# Patient Record
Sex: Male | Born: 1950
Health system: Southern US, Community
[De-identification: ages and names within clinical notes are randomized; demographics above are authoritative.]

## PROBLEM LIST (undated history)

## (undated) DIAGNOSIS — N486 Induration penis plastica: Secondary | ICD-10-CM

## (undated) DIAGNOSIS — Z79899 Other long term (current) drug therapy: Secondary | ICD-10-CM

## (undated) DIAGNOSIS — Z7901 Long term (current) use of anticoagulants: Secondary | ICD-10-CM

## (undated) DIAGNOSIS — K219 Gastro-esophageal reflux disease without esophagitis: Secondary | ICD-10-CM

## (undated) DIAGNOSIS — E119 Type 2 diabetes mellitus without complications: Secondary | ICD-10-CM

## (undated) DIAGNOSIS — Z794 Long term (current) use of insulin: Secondary | ICD-10-CM

## (undated) DIAGNOSIS — Z8673 Personal history of transient ischemic attack (TIA), and cerebral infarction without residual deficits: Secondary | ICD-10-CM

## (undated) DIAGNOSIS — Z9049 Acquired absence of other specified parts of digestive tract: Secondary | ICD-10-CM

## (undated) DIAGNOSIS — H3530 Unspecified macular degeneration: Secondary | ICD-10-CM

## (undated) DIAGNOSIS — Z8719 Personal history of other diseases of the digestive system: Secondary | ICD-10-CM

## (undated) DIAGNOSIS — I499 Cardiac arrhythmia, unspecified: Secondary | ICD-10-CM

## (undated) DIAGNOSIS — I10 Essential (primary) hypertension: Secondary | ICD-10-CM

## (undated) DIAGNOSIS — Z8501 Personal history of malignant neoplasm of esophagus: Secondary | ICD-10-CM

## (undated) DIAGNOSIS — G709 Myoneural disorder, unspecified: Secondary | ICD-10-CM

## (undated) DIAGNOSIS — Z950 Presence of cardiac pacemaker: Secondary | ICD-10-CM

## (undated) DIAGNOSIS — Z9889 Other specified postprocedural states: Secondary | ICD-10-CM

## (undated) DIAGNOSIS — R911 Solitary pulmonary nodule: Secondary | ICD-10-CM

## (undated) DIAGNOSIS — I480 Paroxysmal atrial fibrillation: Secondary | ICD-10-CM

## (undated) DIAGNOSIS — K862 Cyst of pancreas: Secondary | ICD-10-CM

## (undated) DIAGNOSIS — R001 Bradycardia, unspecified: Secondary | ICD-10-CM

## (undated) DIAGNOSIS — F32A Depression, unspecified: Secondary | ICD-10-CM

## (undated) DIAGNOSIS — Z973 Presence of spectacles and contact lenses: Secondary | ICD-10-CM

## (undated) DIAGNOSIS — Z87442 Personal history of urinary calculi: Secondary | ICD-10-CM

## (undated) HISTORY — DX: Unspecified macular degeneration: H35.30

## (undated) HISTORY — PX: CARDIAC PACEMAKER PLACEMENT: SHX583

## (undated) HISTORY — PX: TRANSTHORACIC ECHOCARDIOGRAM: SHX275

## (undated) HISTORY — PX: ESOPHAGECTOMY: SUR457

## (undated) HISTORY — PX: CARDIAC ELECTROPHYSIOLOGY STUDY AND ABLATION: SHX1294

## (undated) HISTORY — PX: ESOPHAGOGASTRODUODENOSCOPY (EGD) WITH ESOPHAGEAL DILATION: SHX5812

## (undated) HISTORY — DX: Long term (current) use of anticoagulants: Z79.01

## (undated) HISTORY — DX: Essential (primary) hypertension: I10

## (undated) HISTORY — DX: Depression, unspecified: F32.A

---
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
## 1980-09-05 HISTORY — PX: INGUINAL HERNIA REPAIR: SUR1180

## 1981-09-05 HISTORY — PX: CHOLECYSTECTOMY: SHX55

## 1985-09-05 HISTORY — PX: INGUINAL HERNIA REPAIR: SUR1180

## 1986-09-05 HISTORY — PX: CHOLECYSTECTOMY: SHX55

## 1988-09-05 HISTORY — PX: KNEE ARTHROSCOPY: SUR90

## 1999-04-12 ENCOUNTER — Encounter: Admission: RE | Admit: 1999-04-12 | Discharge: 1999-07-11 | Payer: Self-pay | Admitting: Internal Medicine

## 2000-02-17 ENCOUNTER — Encounter: Payer: Self-pay | Admitting: *Deleted

## 2000-02-17 ENCOUNTER — Ambulatory Visit (HOSPITAL_COMMUNITY): Admission: RE | Admit: 2000-02-17 | Discharge: 2000-02-17 | Payer: Self-pay | Admitting: *Deleted

## 2001-09-05 DIAGNOSIS — Z8673 Personal history of transient ischemic attack (TIA), and cerebral infarction without residual deficits: Secondary | ICD-10-CM

## 2001-09-05 HISTORY — DX: Personal history of transient ischemic attack (TIA), and cerebral infarction without residual deficits: Z86.73

## 2001-12-14 ENCOUNTER — Encounter: Payer: Self-pay | Admitting: Internal Medicine

## 2001-12-14 ENCOUNTER — Ambulatory Visit (HOSPITAL_COMMUNITY): Admission: RE | Admit: 2001-12-14 | Discharge: 2001-12-14 | Payer: Self-pay | Admitting: Internal Medicine

## 2003-05-05 ENCOUNTER — Ambulatory Visit (HOSPITAL_COMMUNITY): Admission: RE | Admit: 2003-05-05 | Discharge: 2003-05-05 | Payer: Self-pay | Admitting: Gastroenterology

## 2004-09-05 DIAGNOSIS — Z87442 Personal history of urinary calculi: Secondary | ICD-10-CM

## 2004-09-05 HISTORY — DX: Personal history of urinary calculi: Z87.442

## 2004-11-30 ENCOUNTER — Emergency Department (HOSPITAL_COMMUNITY): Admission: EM | Admit: 2004-11-30 | Discharge: 2004-12-01 | Payer: Self-pay | Admitting: Emergency Medicine

## 2011-09-06 DIAGNOSIS — C801 Malignant (primary) neoplasm, unspecified: Secondary | ICD-10-CM

## 2011-09-06 HISTORY — DX: Malignant (primary) neoplasm, unspecified: C80.1

## 2012-04-12 ENCOUNTER — Encounter (INDEPENDENT_AMBULATORY_CARE_PROVIDER_SITE_OTHER): Payer: Self-pay | Admitting: General Surgery

## 2012-04-12 ENCOUNTER — Ambulatory Visit (INDEPENDENT_AMBULATORY_CARE_PROVIDER_SITE_OTHER): Payer: BC Managed Care – PPO | Admitting: General Surgery

## 2012-04-12 DIAGNOSIS — Z6841 Body Mass Index (BMI) 40.0 and over, adult: Secondary | ICD-10-CM

## 2012-04-12 DIAGNOSIS — E785 Hyperlipidemia, unspecified: Secondary | ICD-10-CM

## 2012-04-12 DIAGNOSIS — I10 Essential (primary) hypertension: Secondary | ICD-10-CM

## 2012-04-12 DIAGNOSIS — E119 Type 2 diabetes mellitus without complications: Secondary | ICD-10-CM

## 2012-04-12 NOTE — Progress Notes (Signed)
Patient ID: Lawrence [name]., male   DOB: [birth year], 61 y.o.   MRN: [id number]  Chief Complaint  Patient presents with  . Bariatric Pre-op    initial    HPI Lawrence [name]. is a 61 y.o. male. This patient presents for initial evaluation and consultation for weight loss surgery. He has a BMI of 41 with comorbidities of hypertension, hyperlipidemia, diabetes mellitus and arthritis. He has struggled with his weight since he was married and has children. He says that he was very athletic previously but since he's been working and married he has not had time to keep her routine for exercise and is currently not exercising regularly. He has been on several diets with the best result from [employer] which he is currently on. This has been able to maintain his legs but he really hasn't been able to lose much. 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  He denies any reflux. He says that he is debating whether to do the sleeve of the bypass but it leaning towards a sleeve gastrectomy. He has been diabetic for about 20 years and his last hemoglobin A1c was around the 7 range. HPI  Past Medical History  Diagnosis Date  . Diabetes mellitus   . Hyperlipidemia   . Hypertension     Past Surgical History  Procedure Date  . Hernia repair     2x  . Joint replacement   . Cholecystectomy     Family History  Problem Relation Age of Onset  . Cancer Father     kidney    Social History History  Substance Use Topics  . Smoking status: Never Smoker   . Smokeless tobacco: Not on file  . Alcohol Use: Yes     socially    Allergies  Allergen Reactions  . Codeine Anxiety    Current Outpatient Prescriptions  Medication Sig Dispense Refill  . amLODipine (NORVASC) 10 MG tablet       . aspirin 325 MG tablet Take 325 mg by mouth daily.      Marland Kitchen atorvastatin (LIPITOR) 20 MG tablet Take 20 mg by mouth daily.      . B-D INS SYR ULTRAFINE  1CC/30G 30G X 1/2" 1 ML MISC       . FLUoxetine (PROZAC) 20 MG tablet       . fluticasone (FLONASE) 50 MCG/ACT nasal spray Place 2 sprays into the nose daily.      Marland Kitchen glimepiride (AMARYL) 2 MG tablet Take 2 mg by mouth daily before breakfast.      . LANTUS 100 UNIT/ML injection       . lisinopril-hydrochlorothiazide (PRINZIDE,ZESTORETIC) 20-25 MG per tablet       . LORazepam (ATIVAN) 1 MG tablet Take 1 mg by mouth every 8 (eight) hours.      . metFORMIN (GLUCOPHAGE-XR) 500 MG 24 hr tablet       . ONE TOUCH ULTRA TEST test strip       . pioglitazone (ACTOS) 45 MG tablet Take 45 mg by mouth daily.      . simvastatin (ZOCOR) 40 MG tablet       . nebivolol (BYSTOLIC) 5 MG tablet Take 5 mg by mouth daily.        Review of Systems Review of Systems All other review of systems negative or noncontributory except as stated in the HPI Blood pressure 138/64, pulse 73, temperature 97 F (36.1 C), temperature  source Temporal, resp. rate 16, height 6\' 1"  (1.854 m), weight 307 lb 9.6 oz (139.526 kg).  Physical Exam Physical Exam Physical Exam  Vitals reviewed. Constitutional: He is oriented to person, place, and time. He appears well-developed and well-nourished. No distress.  HENT:  Head: Normocephalic and atraumatic.  Mouth/Throat: No oropharyngeal exudate.  Eyes: Conjunctivae and EOM are normal. Pupils are equal, round, and reactive to light. Right eye exhibits no discharge. Left eye exhibits no discharge. No scleral icterus.  Neck: Normal range of motion. No tracheal deviation present.  Cardiovascular: Normal rate, regular rhythm and normal heart sounds.   Pulmonary/Chest: Effort normal and breath sounds normal. No stridor. No respiratory distress. He has no wheezes. He has no rales. He exhibits no tenderness.  Abdominal: Soft. Bowel sounds are normal. He exhibits no distension and no mass. There is no tenderness. There is no rebound and no guarding.  Well healed RUQ incision. Musculoskeletal:  Normal range of motion. He exhibits no edema and no tenderness.  Neurological: He is alert and oriented to person, place, and time.  Skin: Skin is warm and dry. No rash noted. He is not diaphoretic. No erythema. No pallor.  Psychiatric: He has a normal mood and affect. His behavior is normal. Judgment and thought content normal.    Data Reviewed   Assessment    Morbid obesity with a BMI of 41 and comorbidities of hypertension, diabetes, hyperlipidemia and arthritis I think that he would be a fine candidate for weight loss surgery and we discussed all of the medical and surgical options for weight loss including the lap band, sleeve gastrectomy, Roux-en-Y gastric bypass. We discussed the pros and cons and the risks and benefits of each and he is debating between the sleeve gastrectomy or the gastric bypass. He does not have reflux and would be fine candidate for the sleeve but I also explained that given his lack of activity and diabetes, and the Roux-en-Y gastric bypass might be a more reliable option for him. Either way of, I think that he would do well. The risks of infection, bleeding, pain, scarring, weight regain, too little or too much weight loss, vitamin deficiencies and need for lifelong vitamin supplementation, hair loss, need for protein supplementation, leaks, stricture, reflux, food intolerance, need for reoperation and conversion to roux Y gastric bypass, need for open surgery, injury to spleen or surrounding structures, DVT's, PE, and death again discussed with the patient and the patient expressed understanding and desires to proceed with the workuhep for sleeve or RYGB.   Plan    We will refer him for psychology evaluation of a nutrition evaluation of and check his nutrition labs and we will get him set up for preoperative workup for possible room a gastric bypass or sleeve gastrectomy depending on what he chooses.       Lodema Pilot DAVID 04/12/2012, 11:11 AM

## 2012-04-13 LAB — CBC WITH DIFFERENTIAL/PLATELET
Basophils Relative: 1 % (ref 0–1)
Eosinophils Absolute: 0.1 10*3/uL (ref 0.0–0.7)
HCT: 45 % (ref 39.0–52.0)
Hemoglobin: 15.2 g/dL (ref 13.0–17.0)
MCH: 30.8 pg (ref 26.0–34.0)
MCHC: 33.8 g/dL (ref 30.0–36.0)
Monocytes Absolute: 0.6 10*3/uL (ref 0.1–1.0)
Monocytes Relative: 8 % (ref 3–12)
Neutrophils Relative %: 64 % (ref 43–77)

## 2012-04-13 LAB — COMPREHENSIVE METABOLIC PANEL
Alkaline Phosphatase: 85 U/L (ref 39–117)
BUN: 22 mg/dL (ref 6–23)
Glucose, Bld: 121 mg/dL — ABNORMAL HIGH (ref 70–99)
Total Bilirubin: 0.9 mg/dL (ref 0.3–1.2)

## 2012-04-13 LAB — HEMOGLOBIN A1C: Hgb A1c MFr Bld: 8.6 % — ABNORMAL HIGH (ref <5.7)

## 2012-04-13 LAB — LIPID PANEL
Cholesterol: 157 mg/dL (ref 0–200)
VLDL: 21 mg/dL (ref 0–40)

## 2012-04-13 LAB — T4: T4, Total: 7.8 ug/dL (ref 5.0–12.5)

## 2012-04-16 LAB — H. PYLORI ANTIBODY, IGG: H Pylori IgG: 0.69 {ISR}

## 2012-04-24 ENCOUNTER — Encounter: Payer: Self-pay | Admitting: *Deleted

## 2012-04-24 ENCOUNTER — Encounter: Payer: BC Managed Care – PPO | Attending: General Surgery | Admitting: *Deleted

## 2012-04-24 DIAGNOSIS — Z01818 Encounter for other preprocedural examination: Secondary | ICD-10-CM | POA: Insufficient documentation

## 2012-04-24 DIAGNOSIS — Z713 Dietary counseling and surveillance: Secondary | ICD-10-CM | POA: Insufficient documentation

## 2012-04-24 NOTE — Patient Instructions (Signed)
   Follow Pre-Op Nutrition Goals to prepare for Gastric Bypass Surgery.   Call the [facility] at [phone] once you have been given your surgery date to enrolled in the Pre-Op Nutrition Class. You will need to attend this nutrition class 3-4 weeks prior to your surgery. 

## 2012-04-24 NOTE — Progress Notes (Signed)
  Pre-Op Assessment Visit:  Pre-Operative RYGB Surgery  Medical Nutrition Therapy:  Appt start time: 0900   End time:  1000.  Patient was seen on 04/24/2012 for Pre-Operative RYGB Nutrition Assessment. Assessment and letter of approval faxed to [facility] B[facility] Program coordinator on 04/24/2012.  Approval letter sent to [facility] center and will be available in the chart under the media tab.  Handouts given during visit include:  Pre-Op Goals   Bariatric Surgery Protein Shakes handout  Patient to call for Pre-Op and Post-Op Nutrition Education at the [facility] when surgery is scheduled.

## 2012-04-26 ENCOUNTER — Ambulatory Visit (HOSPITAL_COMMUNITY): Payer: Self-pay

## 2012-04-30 ENCOUNTER — Ambulatory Visit (HOSPITAL_COMMUNITY)
Admission: RE | Admit: 2012-04-30 | Discharge: 2012-04-30 | Disposition: A | Discharge status: Home / Self Care | Payer: BC Managed Care – PPO | Source: Ambulatory Visit | Attending: General Surgery | Admitting: General Surgery

## 2012-04-30 DIAGNOSIS — E785 Hyperlipidemia, unspecified: Secondary | ICD-10-CM | POA: Insufficient documentation

## 2012-04-30 DIAGNOSIS — K449 Diaphragmatic hernia without obstruction or gangrene: Secondary | ICD-10-CM | POA: Insufficient documentation

## 2012-04-30 DIAGNOSIS — K219 Gastro-esophageal reflux disease without esophagitis: Secondary | ICD-10-CM | POA: Insufficient documentation

## 2012-04-30 DIAGNOSIS — M129 Arthropathy, unspecified: Secondary | ICD-10-CM | POA: Insufficient documentation

## 2012-04-30 DIAGNOSIS — E119 Type 2 diabetes mellitus without complications: Secondary | ICD-10-CM

## 2012-04-30 DIAGNOSIS — Z6841 Body Mass Index (BMI) 40.0 and over, adult: Secondary | ICD-10-CM | POA: Insufficient documentation

## 2012-04-30 DIAGNOSIS — I10 Essential (primary) hypertension: Secondary | ICD-10-CM | POA: Insufficient documentation

## 2012-05-17 ENCOUNTER — Encounter: Payer: Self-pay | Admitting: *Deleted

## 2012-05-17 ENCOUNTER — Telehealth: Payer: Self-pay | Admitting: *Deleted

## 2012-05-17 NOTE — Telephone Encounter (Signed)
Isabelle Course called regarding Pre-Op class for her husband, who is having RYGB. Is still awaiting a surgery date from [facility]. He is hoping to have surgery prior to Oct 10th when he is set to leave town on business.  Will email Pre-Op diet and Bariatric Surgery Protein Shakes handout to Isabelle Course so he can start the diet 2 weeks before his surgery if date is given last minute. She verbalized understanding that he will still need to attend Pre-Op class or do one-on-one visits with Olegario Messier and myself prior to surgery. States she will call with questions.

## 2012-05-31 ENCOUNTER — Encounter: Payer: Self-pay | Admitting: *Deleted

## 2012-05-31 ENCOUNTER — Encounter: Payer: BC Managed Care – PPO | Attending: General Surgery | Admitting: *Deleted

## 2012-05-31 ENCOUNTER — Other Ambulatory Visit (INDEPENDENT_AMBULATORY_CARE_PROVIDER_SITE_OTHER): Payer: Self-pay | Admitting: General Surgery

## 2012-05-31 VITALS — Ht 73.0 in | Wt 301.8 lb

## 2012-05-31 DIAGNOSIS — Z713 Dietary counseling and surveillance: Secondary | ICD-10-CM | POA: Insufficient documentation

## 2012-05-31 DIAGNOSIS — Z01818 Encounter for other preprocedural examination: Secondary | ICD-10-CM | POA: Insufficient documentation

## 2012-05-31 DIAGNOSIS — E669 Obesity, unspecified: Secondary | ICD-10-CM

## 2012-05-31 NOTE — Patient Instructions (Signed)
Follow:   Pre-Op Diet per MD 2 weeks prior to surgery  Phase 2- Liquids (clear/full) 2 weeks after surgery  Vitamin/Mineral/Calcium guidelines for purchasing bariatric supplements  Exercise guidelines pre and post-op per MD  Follow-up at [facility] in 2 weeks post-op for diet advancement. Contact Felice Deem as needed with questions/concerns. 

## 2012-05-31 NOTE — Progress Notes (Signed)
Bariatric Class:  Appt start time: 0830 end time:  0930.  Pre-Operative Nutrition Class  Patient was seen on 05/31/2012 for Pre-Operative Bariatric Surgery Education at the [facility].   Surgery date: TBD Surgery type: RYGB Start weight at [facility]: 306.2 (04/24/12)  Weight today: 301.8 lbs Weight change: 4.4 lbs Total weight lost: 4.4 lbs BMI: 39.8 kg/m^2  Samples given per [facility]NT protocol: Bariatric Advantage Multivitamin Lot # [id number]; Exp: 12/13  Celebrate Vitamins Multivitamin Lot # [id number]; Exp: 07/14  Celebrate Vitamins Iron + C 60 mg Lot # [id number]; Exp: 03/14  Celebrate Vitamins Sublingual B12 Lot # [id number]; Exp: 05/15  Opurity Vitamins Multivitamin Lot # [id number]; Exp: 11/14  Premier Nutrition Protein Shake Lot # [id number]; Exp: 03/01/13  The following the learning objective met by the patient during this course:  Identifies Pre-Op Dietary Goals and will begin 2 weeks pre-operatively  Identifies appropriate sources of fluids and proteins   States protein recommendations and appropriate sources pre and post-operatively  Identifies Post-Operative Dietary Goals and will follow for 2 weeks post-operatively  Identifies appropriate multivitamin and calcium sources  Describes the need for physical activity post-operatively and will follow MD recommendations  States when to call healthcare provider regarding medication questions or post-operative complications  Handouts given during class include:  Pre-Op Bariatric Surgery Diet Handout  Protein Shake Handout  Post-Op Bariatric Surgery Nutrition Handout  [facility] Program Information Flyer  Support Group Information Flyer  Follow-Up Plan: Patient will follow-up at [facility] 2 weeks post operatively for diet advancement per MD.

## 2012-06-05 HISTORY — PX: LAPAROSCOPIC GASTRIC SLEEVE RESECTION: SHX5895

## 2012-06-12 ENCOUNTER — Encounter (HOSPITAL_COMMUNITY): Payer: Self-pay | Admitting: Pharmacy Technician

## 2012-06-14 ENCOUNTER — Telehealth (INDEPENDENT_AMBULATORY_CARE_PROVIDER_SITE_OTHER): Payer: Self-pay

## 2012-06-14 NOTE — Telephone Encounter (Signed)
Patient notified of appt time change for 06/15/12 w/Dr. Biagio Quint

## 2012-06-15 ENCOUNTER — Ambulatory Visit (INDEPENDENT_AMBULATORY_CARE_PROVIDER_SITE_OTHER): Payer: BC Managed Care – PPO | Admitting: General Surgery

## 2012-06-15 ENCOUNTER — Encounter (INDEPENDENT_AMBULATORY_CARE_PROVIDER_SITE_OTHER): Payer: Self-pay | Admitting: General Surgery

## 2012-06-15 DIAGNOSIS — E119 Type 2 diabetes mellitus without complications: Secondary | ICD-10-CM

## 2012-06-15 DIAGNOSIS — Z6841 Body Mass Index (BMI) 40.0 and over, adult: Secondary | ICD-10-CM

## 2012-06-15 DIAGNOSIS — I10 Essential (primary) hypertension: Secondary | ICD-10-CM

## 2012-06-15 DIAGNOSIS — E786 Lipoprotein deficiency: Secondary | ICD-10-CM

## 2012-06-15 NOTE — Progress Notes (Signed)
Patient ID: Lawrence Donald Ahonen Jr., male   DOB: 11/05/1950, 61 y.o.   MRN: [id number]  Chief Complaint  Patient presents with  . Bariatric Pre-op    HPI Lawrence Donald Remo Jr. is a 61 y.o. male.  This patient is here for his preoperative evaluation prior to weight loss surgery. He has been trying to decide between the sleeve gastrectomy and Roux-en-Y gastric bypass and has been approved for Roux-en-Y gastric bypass. He has selected this because of the greater chance of resolution of his comorbidities and because of his diabetes. However, he again emphasized to me that if necessary at the time of his procedure if Roux-en-Y gastric bypass is not possible laparoscopically, then he would prefer a laparoscopic sleeve gastrectomy over open bypass. He travels frequently in has arthritis in his knees which limit his ability for exercise. He has already had an upper GI and laboratory studies and is ready for his procedure. HPI  Past Medical History  Diagnosis Date  . Diabetes mellitus   . Hyperlipidemia   . Hypertension   . Morbid obesity     Past Surgical History  Procedure Date  . Hernia repair     2x  . Joint replacement   . Cholecystectomy     Family History  Problem Relation Age of Onset  . Cancer Father     kidney    Social History History  Substance Use Topics  . Smoking status: Never Smoker   . Smokeless tobacco: Not on file  . Alcohol Use: Yes     socially    Allergies  Allergen Reactions  . Codeine Anxiety    Current Outpatient Prescriptions  Medication Sig Dispense Refill  . albuterol (PROVENTIL HFA;VENTOLIN HFA) 108 (90 BASE) MCG/ACT inhaler Inhale 2 puffs into the lungs every 6 (six) hours as needed. For shortness of breath or wheezing      . amLODipine (NORVASC) 10 MG tablet Take 10 mg by mouth every morning.       . aspirin 325 MG tablet Take 325 mg by mouth every morning.       . atorvastatin (LIPITOR) 20 MG tablet Take 20 mg by mouth every morning.         . B-D INS SYR ULTRAFINE 1CC/30G 30G X 1/2" 1 ML MISC       . cimetidine (TAGAMET HB) 200 MG tablet Take 200 mg by mouth 4 (four) times daily as needed. For upset stomach      . FLUoxetine (PROZAC) 20 MG tablet Take 20 mg by mouth every morning.       . fluticasone (FLONASE) 50 MCG/ACT nasal spray Place 2 sprays into the nose daily.      . glimepiride (AMARYL) 2 MG tablet Take 2 mg by mouth daily before breakfast.      . ibuprofen (ADVIL,MOTRIN) 200 MG tablet Take 400 mg by mouth every 6 (six) hours as needed. For pain      . LANTUS 100 UNIT/ML injection Inject 55 Units into the skin every morning.       . lisinopril-hydrochlorothiazide (PRINZIDE,ZESTORETIC) 20-25 MG per tablet Take 1 tablet by mouth every morning.       . LORazepam (ATIVAN) 1 MG tablet Take 1 mg by mouth every 8 (eight) hours as needed. For anxiety      . metFORMIN (GLUCOPHAGE-XR) 500 MG 24 hr tablet Take 500 mg by mouth daily with breakfast.       . nebivolol (BYSTOLIC) 5 MG tablet Take 5   mg by mouth daily.      . ONE TOUCH ULTRA TEST test strip       . pioglitazone (ACTOS) 45 MG tablet Take 45 mg by mouth every morning.       . simvastatin (ZOCOR) 40 MG tablet Take 40 mg by mouth at bedtime.       . vitamin C (ASCORBIC ACID) 500 MG tablet Take 500 mg by mouth daily as needed. When sick        Review of Systems Review of Systems All other review of systems negative or noncontributory except as stated in the HPI  Blood pressure 130/82, pulse 68, temperature 99 F (37.2 C), temperature source Oral, resp. rate 18, height 6' 1" (1.854 m), weight 300 lb 3.2 oz (136.17 kg).  Physical Exam Physical Exam Physical Exam  Vitals reviewed. Constitutional: He is oriented to person, place, and time. He appears well-developed and well-nourished. No distress.  HENT:  Head: Normocephalic and atraumatic.  Mouth/Throat: No oropharyngeal exudate.  Eyes: Conjunctivae and EOM are normal. Pupils are equal, round, and reactive to light.  Right eye exhibits no discharge. Left eye exhibits no discharge. No scleral icterus.  Neck: Normal range of motion. No tracheal deviation present.  Cardiovascular: Normal rate, regular rhythm and normal heart sounds.   Pulmonary/Chest: Effort normal and breath sounds normal. No stridor. No respiratory distress. He has no wheezes. He has no rales. He exhibits no tenderness.  Abdominal: Soft. Bowel sounds are normal. He exhibits no distension and no mass. There is no tenderness. There is no rebound and no guarding. he does have a right upper quadrant subcostal incision from a prior open cholecystectomy Musculoskeletal: Normal range of motion. He exhibits no edema and no tenderness.  Neurological: He is alert and oriented to person, place, and time.  Skin: Skin is warm and dry. No rash noted. He is not diaphoretic. No erythema. No pallor.  Psychiatric: He has a normal mood and affect. His behavior is normal. Judgment and thought content normal.    Data Reviewed Ugi, labs  Assessment    Morbid obesity with a BMI of 40 and comorbidities of hypertension, diabetes, hyperlipidemia, and arthritis He seems anxious and motivated for his upcoming weight loss surgery. He has selected a laparoscopic Roux-en-Y gastric bypass and we discussed the pros and cons and risks and benefits of this. The risks of infection, bleeding, pain, scarring, weight regain, too little or too much weight loss, vitamin deficiencies and need for lifelong vitamin supplementation, hair loss, need for protein supplementation, leaks, stricture, reflux, food intolerance, need for reoperation , need for open surgery, injury to spleen or surrounding structures, DVT's, PE, and death again discussed with the patient and the patient expressed understanding and desires to proceed with laparoscopic RYGB, possible open, intraoperative endoscopy. Because of his prior open cholecystectomy, he understands that he may be higher risk for open procedure  and he has indicated to me that he would rather have the intraoperative decision to convert to laparoscopic sleeve gastrectomy if it is not possible to perform laparoscopic Roux-en-Y gastric bypass. In other words, he would rather have laparoscopic Roux-en-Y gastric bypass followed by laparoscopic sleeve gastrectomy followed by open gastric bypass or sleeve gastrectomy in that order.       Plan    We will proceed with obstructed Roux-en-Y gastric bypass as scheduled. An       Elaijah Munoz DAVID 06/15/2012, 1:20 PM    

## 2012-06-18 ENCOUNTER — Encounter (HOSPITAL_COMMUNITY)
Admission: RE | Admit: 2012-06-18 | Discharge: 2012-06-18 | Disposition: A | Discharge status: Home / Self Care | Payer: BC Managed Care – PPO | Source: Ambulatory Visit | Attending: General Surgery | Admitting: General Surgery

## 2012-06-18 ENCOUNTER — Encounter (HOSPITAL_COMMUNITY): Payer: Self-pay

## 2012-06-18 ENCOUNTER — Ambulatory Visit (HOSPITAL_COMMUNITY)
Admission: RE | Admit: 2012-06-18 | Discharge: 2012-06-18 | Disposition: A | Discharge status: Home / Self Care | Payer: BC Managed Care – PPO | Source: Ambulatory Visit | Attending: General Surgery | Admitting: General Surgery

## 2012-06-18 DIAGNOSIS — Z01818 Encounter for other preprocedural examination: Secondary | ICD-10-CM | POA: Insufficient documentation

## 2012-06-18 DIAGNOSIS — R059 Cough, unspecified: Secondary | ICD-10-CM | POA: Insufficient documentation

## 2012-06-18 DIAGNOSIS — Z01812 Encounter for preprocedural laboratory examination: Secondary | ICD-10-CM | POA: Insufficient documentation

## 2012-06-18 DIAGNOSIS — R05 Cough: Secondary | ICD-10-CM | POA: Insufficient documentation

## 2012-06-18 DIAGNOSIS — Z9089 Acquired absence of other organs: Secondary | ICD-10-CM | POA: Insufficient documentation

## 2012-06-18 LAB — COMPREHENSIVE METABOLIC PANEL
ALT: 16 U/L (ref 0–53)
AST: 17 U/L (ref 0–37)
CO2: 27 mEq/L (ref 19–32)
Calcium: 9.4 mg/dL (ref 8.4–10.5)
Chloride: 97 mEq/L (ref 96–112)
GFR calc non Af Amer: 77 mL/min — ABNORMAL LOW (ref >90)
Sodium: 134 mEq/L — ABNORMAL LOW (ref 135–145)
Total Bilirubin: 0.5 mg/dL (ref 0.3–1.2)

## 2012-06-18 LAB — CBC WITH DIFFERENTIAL/PLATELET
Basophils Absolute: 0 10*3/uL (ref 0.0–0.1)
Lymphocytes Relative: 19 % (ref 12–46)
Neutro Abs: 6.3 10*3/uL (ref 1.7–7.7)
Neutrophils Relative %: 72 % (ref 43–77)
Platelets: 255 10*3/uL (ref 150–400)
RDW: 12.9 % (ref 11.5–15.5)
WBC: 8.7 10*3/uL (ref 4.0–10.5)

## 2012-06-18 NOTE — Progress Notes (Signed)
04-30-2012 ekg epic

## 2012-06-18 NOTE — Patient Instructions (Signed)
20 Lawrence [name].  06/18/2012   Your procedure is scheduled on:  06-26-2012  Report to [facility] at 0515  AM.  Call this number if you have problems the morning of surgery: [phone]   Remember:   Do not eat food or drink liquids:After Midnight.  .  Take these medicines the morning of surgery with A SIP OF WATER: amlodipine, lipitor, prozac, bystolic  Do not wear jewelry or make up.  Do not wear lotions, powders, or perfumes. You may wear deodorant.    Do not bring valuables to the hospital.  Contacts, dentures or bridgework may not be worn into surgery.  Leave suitcase in the car. After surgery it may be brought to your room.  For patients admitted to the hospital, checkout time is 11:00 AM the day of discharge                             Patients discharged the day of surgery will not be allowed to drive home. If going home same day of surgery, you must have someone stay with you the first 24 hours at home and arrange for some one to drive you home from hospital.    Special Instructions: See [facility] Preparing for Surgery instruction sheet. Women do not shave legs or underarms for 12 hours before showers. Men may shave face morning of surgery.    Please read over the following fact sheets that you were given: MRSA Information  [facility] pre op nurse phone number [phone], call if needed

## 2012-06-18 NOTE — Progress Notes (Signed)
06/18/12 0905  OBSTRUCTIVE SLEEP APNEA  Score 4 or greater  Results sent to PCP

## 2012-06-22 ENCOUNTER — Ambulatory Visit (INDEPENDENT_AMBULATORY_CARE_PROVIDER_SITE_OTHER): Payer: BC Managed Care – PPO | Admitting: General Surgery

## 2012-06-26 ENCOUNTER — Inpatient Hospital Stay (HOSPITAL_COMMUNITY): Payer: BC Managed Care – PPO | Admitting: Anesthesiology

## 2012-06-26 ENCOUNTER — Encounter (HOSPITAL_COMMUNITY): Payer: Self-pay | Admitting: *Deleted

## 2012-06-26 ENCOUNTER — Inpatient Hospital Stay (HOSPITAL_COMMUNITY)
Admission: RE | Admit: 2012-06-26 | Discharge: 2012-06-28 | DRG: 468 | Disposition: A | Discharge status: Home / Self Care | Payer: BC Managed Care – PPO | Source: Ambulatory Visit | Attending: General Surgery | Admitting: General Surgery

## 2012-06-26 ENCOUNTER — Encounter (HOSPITAL_COMMUNITY): Payer: Self-pay | Admitting: Anesthesiology

## 2012-06-26 ENCOUNTER — Encounter (HOSPITAL_COMMUNITY)
Admission: RE | Disposition: A | Discharge status: Home / Self Care | Payer: Self-pay | Source: Ambulatory Visit | Attending: General Surgery

## 2012-06-26 DIAGNOSIS — K432 Incisional hernia without obstruction or gangrene: Secondary | ICD-10-CM | POA: Diagnosis present

## 2012-06-26 DIAGNOSIS — I10 Essential (primary) hypertension: Secondary | ICD-10-CM

## 2012-06-26 DIAGNOSIS — C159 Malignant neoplasm of esophagus, unspecified: Secondary | ICD-10-CM

## 2012-06-26 DIAGNOSIS — E11319 Type 2 diabetes mellitus with unspecified diabetic retinopathy without macular edema: Secondary | ICD-10-CM | POA: Diagnosis present

## 2012-06-26 DIAGNOSIS — Z885 Allergy status to narcotic agent status: Secondary | ICD-10-CM

## 2012-06-26 DIAGNOSIS — M129 Arthropathy, unspecified: Secondary | ICD-10-CM | POA: Diagnosis present

## 2012-06-26 DIAGNOSIS — Z9089 Acquired absence of other organs: Secondary | ICD-10-CM

## 2012-06-26 DIAGNOSIS — F411 Generalized anxiety disorder: Secondary | ICD-10-CM | POA: Diagnosis present

## 2012-06-26 DIAGNOSIS — Z8673 Personal history of transient ischemic attack (TIA), and cerebral infarction without residual deficits: Secondary | ICD-10-CM

## 2012-06-26 DIAGNOSIS — Z7982 Long term (current) use of aspirin: Secondary | ICD-10-CM

## 2012-06-26 DIAGNOSIS — E1039 Type 1 diabetes mellitus with other diabetic ophthalmic complication: Secondary | ICD-10-CM | POA: Diagnosis present

## 2012-06-26 DIAGNOSIS — Z794 Long term (current) use of insulin: Secondary | ICD-10-CM

## 2012-06-26 DIAGNOSIS — Z9660 Presence of unspecified orthopedic joint implant: Secondary | ICD-10-CM

## 2012-06-26 DIAGNOSIS — K219 Gastro-esophageal reflux disease without esophagitis: Secondary | ICD-10-CM | POA: Diagnosis present

## 2012-06-26 DIAGNOSIS — E119 Type 2 diabetes mellitus without complications: Secondary | ICD-10-CM | POA: Diagnosis present

## 2012-06-26 DIAGNOSIS — E785 Hyperlipidemia, unspecified: Secondary | ICD-10-CM | POA: Diagnosis present

## 2012-06-26 DIAGNOSIS — Z79899 Other long term (current) drug therapy: Secondary | ICD-10-CM

## 2012-06-26 DIAGNOSIS — Z6838 Body mass index (BMI) 38.0-38.9, adult: Secondary | ICD-10-CM

## 2012-06-26 DIAGNOSIS — K449 Diaphragmatic hernia without obstruction or gangrene: Secondary | ICD-10-CM

## 2012-06-26 DIAGNOSIS — Z5309 Procedure and treatment not carried out because of other contraindication: Secondary | ICD-10-CM

## 2012-06-26 DIAGNOSIS — I495 Sick sinus syndrome: Secondary | ICD-10-CM | POA: Diagnosis present

## 2012-06-26 DIAGNOSIS — I4891 Unspecified atrial fibrillation: Secondary | ICD-10-CM

## 2012-06-26 HISTORY — PX: BIOPSY: SHX5522

## 2012-06-26 HISTORY — PX: HIATAL HERNIA REPAIR: SHX195

## 2012-06-26 HISTORY — PX: INCISIONAL HERNIA REPAIR: SHX193

## 2012-06-26 LAB — GLUCOSE, CAPILLARY
Glucose-Capillary: 96 mg/dL (ref 70–99)
Glucose-Capillary: 234 mg/dL — ABNORMAL HIGH (ref 70–99)
Glucose-Capillary: 173 mg/dL — ABNORMAL HIGH (ref 70–99)

## 2012-06-26 SURGERY — ENDOSCOPY, UPPER GI TRACT
Anesthesia: General

## 2012-06-26 MED ORDER — LISINOPRIL 20 MG PO TABS
20.0000 mg | ORAL_TABLET | Freq: Every day | ORAL | Status: DC
  Administered 2012-06-26: 20 mg via ORAL
  Filled 2012-06-26 (×3): qty 1

## 2012-06-26 MED ORDER — HYDROMORPHONE HCL PF 1 MG/ML IJ SOLN
0.2500 mg | INTRAMUSCULAR | Status: DC | PRN
  Administered 2012-06-26 (×3): 0.5 mg via INTRAVENOUS

## 2012-06-26 MED ORDER — ONDANSETRON HCL 4 MG/2ML IJ SOLN: 4.0000 mg | Freq: Four times a day (QID) | INTRAMUSCULAR | Status: DC | PRN

## 2012-06-26 MED ORDER — ENOXAPARIN SODIUM 40 MG/0.4ML ~~LOC~~ SOLN
40.0000 mg | SUBCUTANEOUS | Status: DC
  Filled 2012-06-26 (×2): qty 0.4

## 2012-06-26 MED ORDER — LORAZEPAM 1 MG PO TABS
1.0000 mg | ORAL_TABLET | Freq: Three times a day (TID) | ORAL | Status: DC | PRN
  Administered 2012-06-26: 1 mg via ORAL
  Filled 2012-06-26: qty 1

## 2012-06-26 MED ORDER — ALBUTEROL SULFATE HFA 108 (90 BASE) MCG/ACT IN AERS
2.0000 | INHALATION_SPRAY | Freq: Four times a day (QID) | RESPIRATORY_TRACT | Status: DC | PRN
  Filled 2012-06-26: qty 6.7

## 2012-06-26 MED ORDER — NEOSTIGMINE METHYLSULFATE 1 MG/ML IJ SOLN
INTRAMUSCULAR | Status: DC | PRN
  Administered 2012-06-26: 5 mg via INTRAVENOUS

## 2012-06-26 MED ORDER — NEBIVOLOL HCL 5 MG PO TABS
5.0000 mg | ORAL_TABLET | Freq: Every day | ORAL | Status: DC
  Filled 2012-06-26 (×3): qty 1

## 2012-06-26 MED ORDER — ROCURONIUM BROMIDE 100 MG/10ML IV SOLN
INTRAVENOUS | Status: DC | PRN
  Administered 2012-06-26 (×5): 10 mg via INTRAVENOUS
  Administered 2012-06-26: 40 mg via INTRAVENOUS
  Administered 2012-06-26: 10 mg via INTRAVENOUS

## 2012-06-26 MED ORDER — LISINOPRIL-HYDROCHLOROTHIAZIDE 20-25 MG PO TABS: 1.0000 | ORAL_TABLET | Freq: Every morning | ORAL | Status: DC

## 2012-06-26 MED ORDER — POTASSIUM CHLORIDE IN NACL 20-0.9 MEQ/L-% IV SOLN
INTRAVENOUS | Status: DC
  Administered 2012-06-26 – 2012-06-28 (×4): via INTRAVENOUS
  Filled 2012-06-26 (×8): qty 1000

## 2012-06-26 MED ORDER — FENTANYL CITRATE 0.05 MG/ML IJ SOLN
INTRAMUSCULAR | Status: DC | PRN
  Administered 2012-06-26 (×4): 50 ug via INTRAVENOUS

## 2012-06-26 MED ORDER — PROMETHAZINE HCL 25 MG/ML IJ SOLN: 6.2500 mg | INTRAMUSCULAR | Status: DC | PRN

## 2012-06-26 MED ORDER — SUCCINYLCHOLINE CHLORIDE 20 MG/ML IJ SOLN
INTRAMUSCULAR | Status: DC | PRN
  Administered 2012-06-26: 100 mg via INTRAVENOUS

## 2012-06-26 MED ORDER — SODIUM CHLORIDE 0.9 % IV SOLN
1.0000 g | INTRAVENOUS | Status: AC
  Administered 2012-06-26: 1 g via INTRAVENOUS

## 2012-06-26 MED ORDER — FLUOXETINE HCL 20 MG PO TABS
20.0000 mg | ORAL_TABLET | Freq: Every morning | ORAL | Status: DC
  Administered 2012-06-27 – 2012-06-28 (×2): 20 mg via ORAL
  Filled 2012-06-26 (×2): qty 1

## 2012-06-26 MED ORDER — PROMETHAZINE HCL 25 MG/ML IJ SOLN: 25.0000 mg | Freq: Four times a day (QID) | INTRAMUSCULAR | Status: DC | PRN

## 2012-06-26 MED ORDER — GLYCOPYRROLATE 0.2 MG/ML IJ SOLN
INTRAMUSCULAR | Status: DC | PRN
  Administered 2012-06-26: 0.2 mg via INTRAVENOUS
  Administered 2012-06-26: 0.6 mg via INTRAVENOUS

## 2012-06-26 MED ORDER — BUPIVACAINE-EPINEPHRINE 0.25% -1:200000 IJ SOLN
INTRAMUSCULAR | Status: DC | PRN
  Administered 2012-06-26: 20 mL

## 2012-06-26 MED ORDER — LIDOCAINE HCL (CARDIAC) 20 MG/ML IV SOLN
INTRAVENOUS | Status: DC | PRN
  Administered 2012-06-26: 100 mg via INTRAVENOUS

## 2012-06-26 MED ORDER — PROPOFOL 10 MG/ML IV BOLUS
INTRAVENOUS | Status: DC | PRN
  Administered 2012-06-26: 200 mg via INTRAVENOUS

## 2012-06-26 MED ORDER — HEPARIN SODIUM (PORCINE) 5000 UNIT/ML IJ SOLN
5000.0000 [IU] | Freq: Once | INTRAMUSCULAR | Status: AC
  Administered 2012-06-26: 5000 [IU] via SUBCUTANEOUS
  Filled 2012-06-26: qty 1

## 2012-06-26 MED ORDER — ONDANSETRON HCL 4 MG PO TABS: 4.0000 mg | ORAL_TABLET | Freq: Four times a day (QID) | ORAL | Status: DC | PRN

## 2012-06-26 MED ORDER — SODIUM CHLORIDE 0.9 % IR SOLN
Status: DC | PRN
  Administered 2012-06-26: 1000 mL

## 2012-06-26 MED ORDER — ACETAMINOPHEN 10 MG/ML IV SOLN
INTRAVENOUS | Status: DC | PRN
  Administered 2012-06-26: 1000 mg via INTRAVENOUS

## 2012-06-26 MED ORDER — FLUTICASONE PROPIONATE 50 MCG/ACT NA SUSP
2.0000 | Freq: Every day | NASAL | Status: DC
  Administered 2012-06-26: 2 via NASAL
  Filled 2012-06-26: qty 16

## 2012-06-26 MED ORDER — LACTATED RINGERS IV SOLN
INTRAVENOUS | Status: DC | PRN
  Administered 2012-06-26 (×4): via INTRAVENOUS

## 2012-06-26 MED ORDER — INSULIN ASPART 100 UNIT/ML ~~LOC~~ SOLN
0.0000 [IU] | SUBCUTANEOUS | Status: DC
  Administered 2012-06-26 (×2): 5 [IU] via SUBCUTANEOUS
  Administered 2012-06-27 (×4): 3 [IU] via SUBCUTANEOUS
  Administered 2012-06-28 (×2): 2 [IU] via SUBCUTANEOUS

## 2012-06-26 MED ORDER — MORPHINE SULFATE 4 MG/ML IJ SOLN
4.0000 mg | INTRAMUSCULAR | Status: DC | PRN
  Administered 2012-06-26 (×2): 4 mg via INTRAVENOUS
  Filled 2012-06-26 (×3): qty 1

## 2012-06-26 MED ORDER — DEXAMETHASONE SODIUM PHOSPHATE 10 MG/ML IJ SOLN
INTRAMUSCULAR | Status: DC | PRN
  Administered 2012-06-26: 10 mg via INTRAVENOUS

## 2012-06-26 MED ORDER — KETOROLAC TROMETHAMINE 30 MG/ML IJ SOLN: 15.0000 mg | Freq: Once | INTRAMUSCULAR | Status: DC | PRN

## 2012-06-26 MED ORDER — LIDOCAINE HCL 1 % IJ SOLN
INTRAMUSCULAR | Status: DC | PRN
  Administered 2012-06-26: 20 mL

## 2012-06-26 MED ORDER — ONDANSETRON HCL 4 MG/2ML IJ SOLN
INTRAMUSCULAR | Status: DC | PRN
  Administered 2012-06-26: 4 mg via INTRAVENOUS

## 2012-06-26 MED ORDER — LACTATED RINGERS IR SOLN
Status: DC | PRN
  Administered 2012-06-26: 1500 mL

## 2012-06-26 MED ORDER — HYDROCHLOROTHIAZIDE 25 MG PO TABS
25.0000 mg | ORAL_TABLET | Freq: Every day | ORAL | Status: DC
  Administered 2012-06-26: 25 mg via ORAL
  Filled 2012-06-26 (×3): qty 1

## 2012-06-26 MED ORDER — NEBIVOLOL HCL 5 MG PO TABS: 5.0000 mg | ORAL_TABLET | Freq: Every morning | ORAL | Status: DC

## 2012-06-26 MED ORDER — TISSEEL VH 10 ML EX KIT
PACK | CUTANEOUS | Status: DC | PRN
  Administered 2012-06-26: 10 mL

## 2012-06-26 MED ORDER — MIDAZOLAM HCL 5 MG/5ML IJ SOLN
INTRAMUSCULAR | Status: DC | PRN
  Administered 2012-06-26: 2 mg via INTRAVENOUS

## 2012-06-26 MED ORDER — EPHEDRINE SULFATE 50 MG/ML IJ SOLN
INTRAMUSCULAR | Status: DC | PRN
  Administered 2012-06-26: 15 mg via INTRAVENOUS
  Administered 2012-06-26: 10 mg via INTRAVENOUS

## 2012-06-26 SURGICAL SUPPLY — 64 items
ADH SKN CLS APL DERMABOND .7 (GAUZE/BANDAGES/DRESSINGS) ×2
APL SRG 32X5 SNPLK LF DISP (MISCELLANEOUS) ×2
APPLICATOR COTTON TIP 6IN STRL (MISCELLANEOUS) IMPLANT
APPLIER CLIP ROT 10 11.4 M/L (STAPLE)
APR CLP MED LRG 11.4X10 (STAPLE)
BAG SPEC RTRVL LRG 6X4 10 (ENDOMECHANICALS)
CABLE HIGH FREQUENCY MONO STRZ (ELECTRODE) ×2 IMPLANT
CANISTER SUCTION 2500CC (MISCELLANEOUS) ×3 IMPLANT
CHLORAPREP W/TINT 26ML (MISCELLANEOUS) ×6 IMPLANT
CLIP APPLIE ROT 10 11.4 M/L (STAPLE) ×1 IMPLANT
CLOTH BEACON ORANGE TIMEOUT ST (SAFETY) ×3 IMPLANT
COVER SURGICAL LIGHT HANDLE (MISCELLANEOUS) ×1 IMPLANT
DERMABOND ADVANCED (GAUZE/BANDAGES/DRESSINGS) ×1
DERMABOND ADVANCED .7 DNX12 (GAUZE/BANDAGES/DRESSINGS) ×1 IMPLANT
DEVICE SUT QUICK LOAD TK 5 (STAPLE) ×4 IMPLANT
DEVICE SUT TI-KNOT TK 5X26 (MISCELLANEOUS) ×2 IMPLANT
DEVICE SUTURE ENDOST 10MM (ENDOMECHANICALS) ×2 IMPLANT
DEVICE TROCAR PUNCTURE CLOSURE (ENDOMECHANICALS) ×2 IMPLANT
DRAIN CHANNEL 19F RND (DRAIN) ×1 IMPLANT
DRAPE LAPAROSCOPIC ABDOMINAL (DRAPES) ×3 IMPLANT
ELECT REM PT RETURN 9FT ADLT (ELECTROSURGICAL) ×3
ELECTRODE REM PT RTRN 9FT ADLT (ELECTROSURGICAL) ×2 IMPLANT
EVACUATOR DRAINAGE 10X20 100CC (DRAIN) ×1 IMPLANT
EVACUATOR SILICONE 100CC (DRAIN)
GLOVE SURG SS PI 7.5 STRL IVOR (GLOVE) ×6 IMPLANT
GOWN STRL NON-REIN LRG LVL3 (GOWN DISPOSABLE) ×6 IMPLANT
GOWN STRL REIN XL XLG (GOWN DISPOSABLE) ×8 IMPLANT
HANDLE STAPLE EGIA 4 XL (STAPLE) ×2 IMPLANT
HOVERMATT SINGLE USE (MISCELLANEOUS) ×3 IMPLANT
KIT BASIN OR (CUSTOM PROCEDURE TRAY) ×3 IMPLANT
NDL SPNL 22GX3.5 QUINCKE BK (NEEDLE) ×1 IMPLANT
NEEDLE SPNL 22GX3.5 QUINCKE BK (NEEDLE) ×3 IMPLANT
NS IRRIG 1000ML POUR BTL (IV SOLUTION) ×3 IMPLANT
PEN SKIN MARKING BROAD (MISCELLANEOUS) ×3 IMPLANT
PENCIL BUTTON HOLSTER BLD 10FT (ELECTRODE) ×3 IMPLANT
POUCH SPECIMEN RETRIEVAL 10MM (ENDOMECHANICALS) IMPLANT
RELOAD BLACK 60MM ECHELON (STAPLE) ×6 IMPLANT
RELOAD EGIA 60 MED/THCK PURPLE (STAPLE) IMPLANT
RELOAD GREEN (STAPLE) IMPLANT
RELOAD STAPLE 60 BLK XTHK ART (STAPLE) IMPLANT
RELOAD STAPLE 60 MED/THCK ART (STAPLE) IMPLANT
RELOAD TRI 2.0 60 XTHK VAS SUL (STAPLE) IMPLANT
SCISSORS LAP 5X35 DISP (ENDOMECHANICALS) ×1 IMPLANT
SEALANT SURGICAL APPL DUAL CAN (MISCELLANEOUS) ×3 IMPLANT
SET IRRIG TUBING LAPAROSCOPIC (IRRIGATION / IRRIGATOR) ×3 IMPLANT
SHEARS CURVED HARMONIC AC 45CM (MISCELLANEOUS) ×3 IMPLANT
SLEEVE XCEL OPT CAN 5 100 (ENDOMECHANICALS) ×2 IMPLANT
SOLUTION ANTI FOG 6CC (MISCELLANEOUS) ×3 IMPLANT
SPONGE GAUZE 4X4 12PLY (GAUZE/BANDAGES/DRESSINGS) IMPLANT
SPONGE LAP 18X18 X RAY DECT (DISPOSABLE) ×1 IMPLANT
STAPLE ECHEON FLEX 60 POW ENDO (STAPLE) IMPLANT
STRIP PERI DRY VERITAS 60 (STAPLE) IMPLANT
SUT ETHILON 2 0 PS N (SUTURE) ×3 IMPLANT
SUT MNCRL AB 4-0 PS2 18 (SUTURE) ×5 IMPLANT
SUT SURGIDAC NAB ES-9 0 48 120 (SUTURE) IMPLANT
SUT VICRYL 0 UR6 27IN ABS (SUTURE) ×3 IMPLANT
SYR 50ML LL SCALE MARK (SYRINGE) ×3 IMPLANT
TRAY FOLEY CATH 14FRSI W/METER (CATHETERS) ×3 IMPLANT
TRAY LAP CHOLE (CUSTOM PROCEDURE TRAY) ×3 IMPLANT
TROCAR BLADELESS 15MM (ENDOMECHANICALS) ×3 IMPLANT
TROCAR BLADELESS OPT 5 100 (ENDOMECHANICALS) ×7 IMPLANT
TUBING CONNECTING 10 (TUBING) ×3 IMPLANT
TUBING ENDO SMARTCAP (MISCELLANEOUS) ×3 IMPLANT
TUBING FILTER THERMOFLATOR (ELECTROSURGICAL) ×3 IMPLANT

## 2012-06-26 NOTE — Op Note (Signed)
06/26/2012  1:59 PM  PATIENT:  Lawrence Dolly., 61 y.o., male, MRN: [id number]  PREOP DIAGNOSIS:  Morbid obesity, hiatal hernia  POSTOP DIAGNOSIS:   Morbid obesity, hiatal hernia, dilated esophagus with polypoid hemorraghic mass (apprx 1.5 cm to 2.0 in size) at the esophagogastric junction.  [Photos were taken}  PROCEDURE:  Esophagogastroscopy with biopsy of the distal esophageal mass x 3.  SURGEON:   Ovidio Kin, M.D.  ANESTHESIA:   General  INDICATIONS FOR PROCEDURE:  Lawrence Malecki. is a 61 y.o. (DOB: Jun 23, 1951)  white male whose primary care physician is Lillia Mountain, MD and was undergoing a planned sleeve gastrectomy by Dr. Trude Mcburney.     I am doing the upper endoscopy to document the esophagogastric junction in planning the sleeve gastrectomy.  At surgery, the patient has been found to have a large hiatal hernia filled primarily with omentum.  Dr. Biagio Quint has repaired the hiatal hernia and has mobilized the greater curvature of the stomach.  PROCEDURE:  The patient was under general anesthesia while undergoing a planned sleeve gastrectomy.  He was found to have a large hiatal hernia.  I was doing an upper endoscopy to document the EG junction  for the planned sleeve.  Findings include:   Esophagus:   Dilated.  Consistent with a partially obstructed distal esophagus from fat in the hiatal hernia.  He also has a 1.5 - 2.0 cm hemorrhagic polypoid lesion immediately above the EG junction.  This looks like a lesion secondary to trauma/hiatal hernia, not malignant.  I did 3 biopsies with a cold biopsy forceps of the lesion.   GE junction at:  40 cm   Stomach: Unremarkable.   Dr. Biagio Quint was present during the endoscopy. He was manning the laparoscope while I did the upper endoscopy.   He talked with family and the plan was to not do the sleeve gastrectomy at this time.  Dr. Biagio Quint will dictate the hiatal hernia repair.

## 2012-06-26 NOTE — Anesthesia Postprocedure Evaluation (Signed)
  Anesthesia Post-op Note  Patient: Lawrence [name].  Procedure(s) Performed: Procedure(s) (LRB): UPPER GI ENDOSCOPY () LAPAROSCOPIC REPAIR OF HIATAL HERNIA () HERNIA REPAIR INCISIONAL () BIOPSY ()  Patient Location: PACU  Anesthesia Type: General  Level of Consciousness: awake and alert   Airway and Oxygen Therapy: Patient Spontanous Breathing  Post-op Pain: mild  Post-op Assessment: Post-op Vital signs reviewed, Patient's Cardiovascular Status Stable, Respiratory Function Stable, Patent Airway and No signs of Nausea or vomiting  Post-op Vital Signs: stable  Complications: No apparent anesthesia complications

## 2012-06-26 NOTE — Progress Notes (Signed)
Surgery delayed pending insurance certification on amended procedure planned.  Patient transferred back to Short Stay to await further information.   O R schedule amended to allow for surgery later today if approval obtained.  Patient to remain NPO.

## 2012-06-26 NOTE — Transfer of Care (Signed)
Immediate Anesthesia Transfer of Care Note  Patient: Lawrence [name].  Procedure(s) Performed: Procedure(s) (LRB) with comments: UPPER GI ENDOSCOPY () - with biopsy LAPAROSCOPIC REPAIR OF HIATAL HERNIA () HERNIA REPAIR INCISIONAL () BIOPSY ()  Patient Location: PACU  Anesthesia Type: General  Level of Consciousness: sedated  Airway & Oxygen Therapy: Patient Spontanous Breathing and Patient connected to face mask oxygen  Post-op Assessment: Report given to PACU RN and Post -op Vital signs reviewed and stable  Post vital signs: Reviewed and stable  Complications: No apparent anesthesia complications

## 2012-06-26 NOTE — Interval H&P Note (Signed)
History and Physical Interval Note:  06/26/2012 9:32 AM  Lawrence [name].  has presented today for surgery, with the diagnosis of morbid obesity  The various methods of treatment have been discussed with the patient and family. After consideration of risks, benefits and other options for treatment, the patient has consented to  Procedure(s) (LRB) with comments: LAPAROSCOPIC GASTRIC SLEEVE RESECTION (N/A) as a surgical intervention .  The patient's history has been reviewed, patient examined, no change in status, stable for surgery.  I have reviewed the patient's chart and labs.  Questions were answered to the patient's satisfaction.   The patient was seen and evaluated in the preop area.  Last week he decided that he wanted the sleeve gastrectomy instead of the the RYGB.  He feels most comfortable with this procedure as he originally desired.  I again confirmed this with the patient and he says that he is set in his decision to have the sleeve gastrectomy.  I again discussed the risks with him of the procedure.  The risks of infection, bleeding, pain, scarring, weight regain, too little or too much weight loss, vitamin deficiencies and need for lifelong vitamin supplementation, hair loss, need for protein supplementation, leaks, stricture, reflux, food intolerance, need for reoperation and conversion to roux Y gastric bypass, need for open surgery, injury to spleen or surrounding structures, DVT's, PE, and death again discussed with the patient and the patient expressed understanding and desires to proceed with laparoscopic vertical sleeve gastrectomy, possible open, intraoperative endoscopy.  We have received authorization for the procedure #[id number].  We will proceed with lap vertical sleeve gastrectomy, possible open.   Lawrence [name]

## 2012-06-26 NOTE — Op Note (Signed)
Lawrence [name], Lawrence [name] NO.:  [id number]  MEDICAL RECORD NO.:  [id number]  LOCATION:  1535                         FACILITY:  [facility]  PHYSICIAN:  Lodema Pilot, MD       DATE OF BIRTH:  Jul 15, 1951  DATE OF PROCEDURE:  06/26/2012 DATE OF DISCHARGE:                              OPERATIVE REPORT   PREOPERATIVE DIAGNOSIS:  Morbid obesity.  POSTOPERATIVE DIAGNOSIS:  Morbid obesity.  PROCEDURE:  Laparoscopic hiatal hernia repair, laparoscopic repair of incisional hernia, and intraoperative upper endoscopy with biopsies.  SURGEON:  Lodema Pilot, MD  ASSISTANT:  Dr. Ezzard Standing.  ANESTHESIA:  General endotracheal anesthesia with 40 mL of 1% lidocaine with epinephrine and 0.25% Marcaine in a 50:50 mixture.  FLUIDS:  3400 mL of crystalloid.  ESTIMATED BLOOD LOSS:  Less than 100 mL.  DRAINS:  None.  SPECIMENS:  Distal esophageal biopsies sent to Pathology for permanent sectioning.  COMPLICATIONS:  None apparent.  FINDINGS:  Large hiatal hernia with containing omental fat.  The stomach did not appear herniated.  Primarily repaired posteriorly.  A small incisional hernia defect in the medial aspect of his open cholecystectomy scar.  Distal esophageal mass biopsies were taken.  No sleeve gastrectomy was performed.  INDICATION FOR PROCEDURE:  Lawrence [name] is a 61 year old male with a BMI of 40 and comorbidities of diabetes, hypertension, and hyperlipidemia, in need of a durable weight loss solution.  OPERATIVE DETAILS:  Lawrence [name] was seen and evaluated in the preoperative area and risks and benefits of procedure were again discussed in lay terms.  Informed consent was again confirmed with him that he did want to perform the sleeve gastrectomy since originally he was planning on gastric bypass and he had confirmed with me last week that he was interested in sleeve gastrectomy.  We talked again about the risks and benefits of the procedure and informed consent was  obtained. He was given prophylactic antibiotics and subcutaneous heparin and he was taken to the operating room, placed on table in supine position. General endotracheal tube anesthesia was obtained and the Foley catheter was placed.  His abdomen was prepped and draped in a standard surgical fashion.  Then procedure time-out was performed with all operative team members to confirm proper patient and procedure and the abdomen was accessed and the left upper quadrant with a 5-mm Optiview trocar on the first attempt.  Pneumoperitoneum was obtained and laparoscope was introduced and there was no evidence of bowel injury upon entry.  Then a 5-mm left rectus port was placed under direct visualization and some omental adhesions were taken down from the abdominal wall in the right upper quadrant under his prior open cholecystectomy scar.  There was only omentum adhered to the abdominal wall and this was taken down with sharp dissection and blunt dissection sufficient to place a 5-mm trocar in the right upper quadrant.  A right rectus 15-mm port was placed also under direct visualization.  He had a small incisional hernia at the medial aspect of his open cholecystectomy scar and fat was reduced from the defect.  A 5-mm stab incision was made through the epigastrium and a Nathanson liver retractor was placed to retract  the left lobe of the liver.  At this point, we still could not even see the stomach and after inspecting the area of the hiatus, it was noted to have a moderate to large hiatal hernia and the reason the stomach was not identified is because the omental fat was up into the mediastinum anterior to the stomach.  This was reduced hand over hand, but he had a fairly significant amount of omentum up into the defect, but this was completely reduced and actually looked like the stomach was really not even herniated up into the defect.  Prior to that, we felt that the best thing to do would  be to perform hiatal hernia repair first and so the right crus was identified and dissected posteriorly along the right crus and posterior to the esophagus and performed dissection of the left crus as well, still was difficult to see posteriorly, so measured out from the pylorus at least 5 cm and actually or probably more in the 6-7 range initially and we mobilized the short gastric vessels along the greater curvature of the stomach up to the left crus.  This allowed [place] to better visualize the left crus and mobilize the stomach completely posteriorly until we were able to visualize both the right and left crus posteriorly.  A Penrose drain was passed around the distal esophagus and stomach.  This helped retract the stomach and after the left and right crus were identified posteriorly, I placed 3 sutures posteriorly approximating the crura with a 2-0 Ethibond sutures.  These were secured with tie knot device because space was fairly limited due to its intraabdominal obesity.  It was felt that this pretty well approximated the crus and at this time stomach mobilized.  We were ready to measure out definitively from the pylorus and start with the creation of our gastric sleeve.  However prior to performing the gastric sleeve, we decided to perform upper endoscopy to ensure that we had fully mobilize the stomach out of the hiatus and identify the GE junction.  Dr. Ezzard Standing passed the scope through the esophagus and into the stomach without difficulty.  The stomach appeared healthy and it appeared that we had reduced the stomach and again the stomach had not actually herniated. It was mainly the omentum that was herniated up into the defect, but in the distal esophagus right at the GE junction or just proximal to he had a polypoid lesion and it was unclear if this was benign or malignant, although it did not really appear malignant.  Biopsies were taken and sent to Pathology for permanent  sectioning.  We contemplated whether to perform sleeve gastrectomy or just abort the procedure and wait for the biopsies with the reasoning being that if this does return malignant, then we would not want to resect the greater curvature of the stomach as this would possibly be used as the conduit for esophageal resection. Actually called over to [facility]  and spoke with Dr. Lily Peer and we all felt at this point with the finding of this abnormal distal esophagus, the most conservative thing to do would be to perform biopsies and not continue with the sleeve gastrectomy until after we had received pathology.  I spoke with the patient's wife as well and she thought that this would be what the patient would want as well.  Several biopsies were taken by Dr. Ezzard Standing and these were sent to Pathology for permanent sectioning.  I spoke with the pathologist and he  did not feel confident that he would be able to give a definitive diagnosis of benign or malignant or Barrett esophagitis with or without dysplasia upon frozen section alone.  Therefore, the scope was withdrawn.  The biopsies were taken and the rest of the esophagus appeared normal on the mucosa, however it did appear very patulous and dilated as if all of this fat in the hiatal hernia had been chronically obstructing him distally.  The hiatus appeared adequately closed and the stomach did not appear to be injured during the mobilization.  The abdomen was felt to be hemostatic and the liver retractor was removed under direct visualization.  The right upper quadrant incisional hernia was approximated with 0 PDS figure of suture closed using Endoclose device and the 15-mm port site incision was approximated with 0 Vicryl suture using Endoclose device. The remainder of the trocars were removed under direct visualization. The abdominal wall was noted to be hemostatic.  The wounds were injected with a total of 40 mL of 1%  lidocaine with epinephrine and 0.25% Marcaine in a 50:50 mixture. The skin edges were approximated with 4-0 Monocryl subcuticular suture at all skin incisions.          ______________________________ Lodema Pilot, MD     BL/MEDQ  D:  06/26/2012  T:  06/26/2012  Job:  [id number]

## 2012-06-26 NOTE — Progress Notes (Signed)
Last week, he decided that he wanted to stick with his original plan of lap vertical sleeve gastrectomy.  I think that this is fine and I explained that I am willing to go ahead with this procedure, however, we have not received authorization yet from his insurance carrier for this procedure.  I have recommended that we hold off until we get this approval.  I explained that he could potentially not only be responsible for covering all hospital and procedure costs, but also any costs associated with a potential complication or follow up.  We will delay his procedure until we can get authorization.

## 2012-06-26 NOTE — H&P (View-Only) (Signed)
Patient ID: Lawrence [name]., male   DOB: 1950-12-31, 61 y.o.   MRN: [id number]  Chief Complaint  Patient presents with  . Bariatric Pre-op    HPI Lawrence [name]. is a 61 y.o. male.  This patient is here for his preoperative evaluation prior to weight loss surgery. He has been trying to decide between the sleeve gastrectomy and Roux-en-Y gastric bypass and has been approved for Roux-en-Y gastric bypass. He has selected this because of the greater chance of resolution of his comorbidities and because of his diabetes. However, he again emphasized to me that if necessary at the time of his procedure if Roux-en-Y gastric bypass is not possible laparoscopically, then he would prefer a laparoscopic sleeve gastrectomy over open bypass. He travels frequently in has arthritis in his knees which limit his ability for exercise. He has already had an upper GI and laboratory studies and is ready for his procedure. HPI  Past Medical History  Diagnosis Date  . Diabetes mellitus   . Hyperlipidemia   . Hypertension   . Morbid obesity     Past Surgical History  Procedure Date  . Hernia repair     2x  . Joint replacement   . Cholecystectomy     Family History  Problem Relation Age of Onset  . Cancer Father     kidney    Social History History  Substance Use Topics  . Smoking status: Never Smoker   . Smokeless tobacco: Not on file  . Alcohol Use: Yes     socially    Allergies  Allergen Reactions  . Codeine Anxiety    Current Outpatient Prescriptions  Medication Sig Dispense Refill  . albuterol (PROVENTIL HFA;VENTOLIN HFA) 108 (90 BASE) MCG/ACT inhaler Inhale 2 puffs into the lungs every 6 (six) hours as needed. For shortness of breath or wheezing      . amLODipine (NORVASC) 10 MG tablet Take 10 mg by mouth every morning.       Marland Kitchen aspirin 325 MG tablet Take 325 mg by mouth every morning.       Marland Kitchen atorvastatin (LIPITOR) 20 MG tablet Take 20 mg by mouth every morning.         . B-D INS SYR ULTRAFINE 1CC/30G 30G X 1/2" 1 ML MISC       . cimetidine (TAGAMET HB) 200 MG tablet Take 200 mg by mouth 4 (four) times daily as needed. For upset stomach      . FLUoxetine (PROZAC) 20 MG tablet Take 20 mg by mouth every morning.       . fluticasone (FLONASE) 50 MCG/ACT nasal spray Place 2 sprays into the nose daily.      Marland Kitchen glimepiride (AMARYL) 2 MG tablet Take 2 mg by mouth daily before breakfast.      . ibuprofen (ADVIL,MOTRIN) 200 MG tablet Take 400 mg by mouth every 6 (six) hours as needed. For pain      . LANTUS 100 UNIT/ML injection Inject 55 Units into the skin every morning.       Marland Kitchen lisinopril-hydrochlorothiazide (PRINZIDE,ZESTORETIC) 20-25 MG per tablet Take 1 tablet by mouth every morning.       Marland Kitchen LORazepam (ATIVAN) 1 MG tablet Take 1 mg by mouth every 8 (eight) hours as needed. For anxiety      . metFORMIN (GLUCOPHAGE-XR) 500 MG 24 hr tablet Take 500 mg by mouth daily with breakfast.       . nebivolol (BYSTOLIC) 5 MG tablet Take 5  mg by mouth daily.      . ONE TOUCH ULTRA TEST test strip       . pioglitazone (ACTOS) 45 MG tablet Take 45 mg by mouth every morning.       . simvastatin (ZOCOR) 40 MG tablet Take 40 mg by mouth at bedtime.       . vitamin C (ASCORBIC ACID) 500 MG tablet Take 500 mg by mouth daily as needed. When sick        Review of Systems Review of Systems All other review of systems negative or noncontributory except as stated in the HPI  Blood pressure 130/82, pulse 68, temperature 99 F (37.2 C), temperature source Oral, resp. rate 18, height 6\' 1"  (1.854 m), weight 300 lb 3.2 oz (136.17 kg).  Physical Exam Physical Exam Physical Exam  Vitals reviewed. Constitutional: He is oriented to person, place, and time. He appears well-developed and well-nourished. No distress.  HENT:  Head: Normocephalic and atraumatic.  Mouth/Throat: No oropharyngeal exudate.  Eyes: Conjunctivae and EOM are normal. Pupils are equal, round, and reactive to light.  Right eye exhibits no discharge. Left eye exhibits no discharge. No scleral icterus.  Neck: Normal range of motion. No tracheal deviation present.  Cardiovascular: Normal rate, regular rhythm and normal heart sounds.   Pulmonary/Chest: Effort normal and breath sounds normal. No stridor. No respiratory distress. He has no wheezes. He has no rales. He exhibits no tenderness.  Abdominal: Soft. Bowel sounds are normal. He exhibits no distension and no mass. There is no tenderness. There is no rebound and no guarding. he does have a right upper quadrant subcostal incision from a prior open cholecystectomy Musculoskeletal: Normal range of motion. He exhibits no edema and no tenderness.  Neurological: He is alert and oriented to person, place, and time.  Skin: Skin is warm and dry. No rash noted. He is not diaphoretic. No erythema. No pallor.  Psychiatric: He has a normal mood and affect. His behavior is normal. Judgment and thought content normal.    Data Reviewed Ugi, labs  Assessment    Morbid obesity with a BMI of 40 and comorbidities of hypertension, diabetes, hyperlipidemia, and arthritis He seems anxious and motivated for his upcoming weight loss surgery. He has selected a laparoscopic Roux-en-Y gastric bypass and we discussed the pros and cons and risks and benefits of this. The risks of infection, bleeding, pain, scarring, weight regain, too little or too much weight loss, vitamin deficiencies and need for lifelong vitamin supplementation, hair loss, need for protein supplementation, leaks, stricture, reflux, food intolerance, need for reoperation , need for open surgery, injury to spleen or surrounding structures, DVT's, PE, and death again discussed with the patient and the patient expressed understanding and desires to proceed with laparoscopic RYGB, possible open, intraoperative endoscopy. Because of his prior open cholecystectomy, he understands that he may be higher risk for open procedure  and he has indicated to me that he would rather have the intraoperative decision to convert to laparoscopic sleeve gastrectomy if it is not possible to perform laparoscopic Roux-en-Y gastric bypass. In other words, he would rather have laparoscopic Roux-en-Y gastric bypass followed by laparoscopic sleeve gastrectomy followed by open gastric bypass or sleeve gastrectomy in that order.       Plan    We will proceed with obstructed Roux-en-Y gastric bypass as scheduled. An       Montrelle Eddings DAVID 06/15/2012, 1:20 PM

## 2012-06-26 NOTE — Progress Notes (Signed)
Pt states he does not take bystolic and refuses this drug  States he wa unsure of his home meds when he talked to PAT  Wife takes care of his meds and she wasn't with him at this visit

## 2012-06-26 NOTE — Brief Op Note (Signed)
06/26/2012  2:19 PM  PATIENT:  Lawrence [name].  61 y.o. male  PRE-OPERATIVE DIAGNOSIS:  morbid obesity  POST-OPERATIVE DIAGNOSIS:  morbid obesity  PROCEDURE:  Procedure(s) (LRB) with comments: UPPER GI ENDOSCOPY () - with biopsy LAPAROSCOPIC REPAIR OF HIATAL HERNIA () HERNIA REPAIR INCISIONAL () BIOPSY ()  SURGEON:  Surgeon(s) and Role:    * Lodema Pilot, DO - Primary    * Kandis Cocking, MD - Assisting  PHYSICIAN ASSISTANT:   ASSISTANTS: Newman   ANESTHESIA:   general  EBL:  Total I/O In: 3400 [I.V.:3400] Out: 225 [Urine:200; Blood:25]  BLOOD ADMINISTERED:none  DRAINS: none   LOCAL MEDICATIONS USED:  MARCAINE    and LIDOCAINE   SPECIMEN:  Source of Specimen:  distal esophagus  DISPOSITION OF SPECIMEN:  PATHOLOGY  COUNTS:  YES  TOURNIQUET:  * No tourniquets in log *  DICTATION: .Other Dictation: Dictation Number   PLAN OF CARE: Admit to inpatient   PATIENT DISPOSITION:  PACU - hemodynamically stable.   Delay start of Pharmacological VTE agent (>24hrs) due to surgical blood loss or risk of bleeding: no

## 2012-06-26 NOTE — Anesthesia Preprocedure Evaluation (Signed)
Anesthesia Evaluation  Patient identified by MRN, date of birth, ID band Patient awake    Reviewed: Allergy & Precautions, H&P , NPO status , Patient's Chart, lab work & pertinent test results  Airway Mallampati: II TM Distance: <3 FB Neck ROM: Full    Dental No notable dental hx.    Pulmonary neg pulmonary ROS,  breath sounds clear to auscultation  Pulmonary exam normal       Cardiovascular hypertension, Rhythm:Regular Rate:Normal     Neuro/Psych Anxiety negative neurological ROS     GI/Hepatic negative GI ROS, Neg liver ROS,   Endo/Other  diabetesMorbid obesity  Renal/GU negative Renal ROS  negative genitourinary   Musculoskeletal negative musculoskeletal ROS (+)   Abdominal   Peds negative pediatric ROS (+)  Hematology negative hematology ROS (+)   Anesthesia Other Findings   Reproductive/Obstetrics negative OB ROS                           Anesthesia Physical Anesthesia Plan  ASA: III  Anesthesia Plan: General   Post-op Pain Management:    Induction: Intravenous  Airway Management Planned: Oral ETT  Additional Equipment:   Intra-op Plan:   Post-operative Plan: Extubation in OR  Informed Consent: I have reviewed the patients History and Physical, chart, labs and discussed the procedure including the risks, benefits and alternatives for the proposed anesthesia with the patient or authorized representative who has indicated his/her understanding and acceptance.   Dental advisory given  Plan Discussed with: CRNA and Surgeon  Anesthesia Plan Comments:         Anesthesia Quick Evaluation

## 2012-06-27 ENCOUNTER — Encounter (HOSPITAL_COMMUNITY): Payer: Self-pay | Admitting: General Surgery

## 2012-06-27 LAB — CBC WITH DIFFERENTIAL/PLATELET
HCT: 38.1 % — ABNORMAL LOW (ref 39.0–52.0)
Hemoglobin: 13.3 g/dL (ref 13.0–17.0)
Lymphocytes Relative: 7 % — ABNORMAL LOW (ref 12–46)
Monocytes Absolute: 1.2 10*3/uL — ABNORMAL HIGH (ref 0.1–1.0)
Monocytes Relative: 7 % (ref 3–12)
Neutro Abs: 16.1 10*3/uL — ABNORMAL HIGH (ref 1.7–7.7)
WBC: 18.6 10*3/uL — ABNORMAL HIGH (ref 4.0–10.5)

## 2012-06-27 LAB — COMPREHENSIVE METABOLIC PANEL
ALT: 46 U/L (ref 0–53)
BUN: 22 mg/dL (ref 6–23)
CO2: 27 mEq/L (ref 19–32)
Calcium: 9 mg/dL (ref 8.4–10.5)
Creatinine, Ser: 1.1 mg/dL (ref 0.50–1.35)
GFR calc Af Amer: 82 mL/min — ABNORMAL LOW (ref >90)
GFR calc non Af Amer: 71 mL/min — ABNORMAL LOW (ref >90)
Glucose, Bld: 169 mg/dL — ABNORMAL HIGH (ref 70–99)
Sodium: 131 mEq/L — ABNORMAL LOW (ref 135–145)

## 2012-06-27 LAB — CBC
Hemoglobin: 13.3 g/dL (ref 13.0–17.0)
MCHC: 34.6 g/dL (ref 30.0–36.0)
WBC: 15.3 10*3/uL — ABNORMAL HIGH (ref 4.0–10.5)

## 2012-06-27 LAB — GLUCOSE, CAPILLARY
Glucose-Capillary: 117 mg/dL — ABNORMAL HIGH (ref 70–99)
Glucose-Capillary: 153 mg/dL — ABNORMAL HIGH (ref 70–99)
Glucose-Capillary: 160 mg/dL — ABNORMAL HIGH (ref 70–99)

## 2012-06-27 MED ORDER — ENOXAPARIN SODIUM 40 MG/0.4ML ~~LOC~~ SOLN
40.0000 mg | SUBCUTANEOUS | Status: DC
  Administered 2012-06-27 – 2012-06-28 (×2): 40 mg via SUBCUTANEOUS
  Filled 2012-06-27 (×2): qty 0.4

## 2012-06-27 MED ORDER — METFORMIN HCL ER 500 MG PO TB24
500.0000 mg | ORAL_TABLET | Freq: Every day | ORAL | Status: DC
  Administered 2012-06-28: 500 mg via ORAL
  Filled 2012-06-27 (×2): qty 1

## 2012-06-27 MED ORDER — PIOGLITAZONE HCL 45 MG PO TABS
45.0000 mg | ORAL_TABLET | Freq: Every day | ORAL | Status: DC
  Administered 2012-06-27 – 2012-06-28 (×2): 45 mg via ORAL
  Filled 2012-06-27 (×2): qty 1

## 2012-06-27 MED ORDER — PANTOPRAZOLE SODIUM 40 MG PO TBEC: 40.0000 mg | DELAYED_RELEASE_TABLET | Freq: Every day | ORAL | Status: DC

## 2012-06-27 MED ORDER — PANTOPRAZOLE SODIUM 40 MG IV SOLR
40.0000 mg | Freq: Two times a day (BID) | INTRAVENOUS | Status: DC
  Administered 2012-06-27 (×2): 40 mg via INTRAVENOUS
  Filled 2012-06-27 (×2): qty 40

## 2012-06-27 MED ORDER — OXYCODONE-ACETAMINOPHEN 5-325 MG/5ML PO SOLN
10.0000 mL | Freq: Four times a day (QID) | ORAL | Status: DC | PRN
  Administered 2012-06-27 – 2012-06-28 (×4): 10 mL via ORAL
  Filled 2012-06-27 (×4): qty 10

## 2012-06-27 MED ORDER — PANTOPRAZOLE SODIUM 40 MG PO TBEC
40.0000 mg | DELAYED_RELEASE_TABLET | Freq: Two times a day (BID) | ORAL | Status: DC
  Administered 2012-06-28 (×3): 40 mg via ORAL
  Filled 2012-06-27 (×4): qty 1

## 2012-06-27 MED FILL — Lidocaine Inj 1% w/ Epinephrine-1:100000: INTRAMUSCULAR | Qty: 30 | Status: AC

## 2012-06-27 MED FILL — Bupivacaine HCl Preservative Free (PF) Inj 0.25%: INTRAMUSCULAR | Qty: 30 | Status: AC

## 2012-06-27 NOTE — Progress Notes (Signed)
Dr Daphine Deutscher notified of patient experiencing low heart rate. Dr Daphine Deutscher suggested to continue to monitor patient's heart rate and continue to hold pain medication.

## 2012-06-27 NOTE — Care Management Note (Signed)
    Page 1 of 1   06/27/2012     12:07:36 PM   CARE MANAGEMENT NOTE 06/27/2012  Patient:  Lawrence [name], Lawrence [name]   Account Number:  [id number]  Date Initiated:  06/27/2012  Documentation initiated by:  Lorenda Ishihara  Subjective/Objective Assessment:   61 yo male admitted s/p hiatal hernia repair. PTA lived at home with spouse     Action/Plan:   d/c home when stable.   Anticipated DC Date:  06/28/2012   Anticipated DC Plan:  HOME/SELF CARE      DC Planning Services  CM consult      Choice offered to / List presented to:             Status of service:  Completed, signed off Medicare Important Message given?   (If response is "NO", the following Medicare IM given date fields will be blank) Date Medicare IM given:   Date Additional Medicare IM given:    Discharge Disposition:  HOME/SELF CARE  Per UR Regulation:  Reviewed for med. necessity/level of care/duration of stay  If discussed at Long Length of Stay Meetings, dates discussed:    Comments:

## 2012-06-27 NOTE — Progress Notes (Signed)
1 Day Post-Op  Subjective: No issues.  Pain controlled.  Objective: Vital signs in last 24 hours: Temp:  [97.5 F (36.4 C)-99.2 F (37.3 C)] 97.8 F (36.6 C) (10/23 0551) Pulse Rate:  [46-85] 61  (10/23 0551) Resp:  [12-20] 18  (10/23 0551) BP: (118-138)/(50-64) 137/58 mmHg (10/23 0551) SpO2:  [92 %-100 %] 94 % (10/23 0551) Weight:  [290 lb (131.543 kg)] 290 lb (131.543 kg) (10/22 1635) Last BM Date: 06/24/12  Intake/Output from previous day: 10/22 0701 - 10/23 0700 In: 5181.3 [I.V.:5181.3] Out: 850 [Urine:825; Blood:25] Intake/Output this shift:    General appearance: alert, cooperative and no distress Resp: clear to auscultation bilaterally Cardio: brady, regular GI: soft, appropriate incisional tenderness, ND, wounds without infection  Lab Results:   Basename 06/27/12 0455  WBC 15.3*  HGB 13.3  HCT 38.4*  PLT 227   BMET  Basename 06/27/12 0455  NA 131*  K 4.6  CL 97  CO2 27  GLUCOSE 169*  BUN 22  CREATININE 1.10  CALCIUM 9.0   PT/INR No results found for this basename: LABPROT:2,INR:2 in the last 72 hours ABG No results found for this basename: PHART:2,PCO2:2,PO2:2,HCO3:2 in the last 72 hours  Studies/Results: No results found.  Anti-infectives: Anti-infectives     Start     Dose/Rate Route Frequency Ordered Stop   06/26/12 0528   ertapenem (INVANZ) 1 g in sodium chloride 0.9 % 50 mL IVPB        1 g 100 mL/hr over 30 Minutes Intravenous 60 min pre-op 06/26/12 0528 06/26/12 1024          Assessment/Plan: s/p Procedure(s) (LRB) with comments: UPPER GI ENDOSCOPY () - with biopsy LAPAROSCOPIC REPAIR OF HIATAL HERNIA () HERNIA REPAIR INCISIONAL () BIOPSY () full liquids.  oral pain meds.  anticipate home tomorrow.  repeat HGB and start lovenox if HGB okay.  mobilize.  LOS: 1 day    Lawrence [name] DAVID 06/27/2012

## 2012-06-27 NOTE — Progress Notes (Signed)
Order received for Lawrence [name] that may leave patient's IV out Means, Myrtie Hawk RN 06-27-2012 18:43pm

## 2012-06-28 DIAGNOSIS — C159 Malignant neoplasm of esophagus, unspecified: Secondary | ICD-10-CM | POA: Diagnosis present

## 2012-06-28 DIAGNOSIS — I4891 Unspecified atrial fibrillation: Secondary | ICD-10-CM | POA: Diagnosis not present

## 2012-06-28 DIAGNOSIS — E119 Type 2 diabetes mellitus without complications: Secondary | ICD-10-CM | POA: Diagnosis present

## 2012-06-28 DIAGNOSIS — E785 Hyperlipidemia, unspecified: Secondary | ICD-10-CM | POA: Diagnosis present

## 2012-06-28 DIAGNOSIS — I10 Essential (primary) hypertension: Secondary | ICD-10-CM | POA: Diagnosis present

## 2012-06-28 DIAGNOSIS — G459 Transient cerebral ischemic attack, unspecified: Secondary | ICD-10-CM | POA: Insufficient documentation

## 2012-06-28 LAB — COMPREHENSIVE METABOLIC PANEL
ALT: 38 U/L (ref 0–53)
BUN: 17 mg/dL (ref 6–23)
Calcium: 8.7 mg/dL (ref 8.4–10.5)
GFR calc Af Amer: >90 mL/min (ref >90)
Glucose, Bld: 135 mg/dL — ABNORMAL HIGH (ref 70–99)
Sodium: 135 mEq/L (ref 135–145)
Total Protein: 5.8 g/dL — ABNORMAL LOW (ref 6.0–8.3)

## 2012-06-28 LAB — GLUCOSE, CAPILLARY
Glucose-Capillary: 97 mg/dL (ref 70–99)
Glucose-Capillary: 105 mg/dL — ABNORMAL HIGH (ref 70–99)
Glucose-Capillary: 130 mg/dL — ABNORMAL HIGH (ref 70–99)
Glucose-Capillary: 134 mg/dL — ABNORMAL HIGH (ref 70–99)

## 2012-06-28 MED ORDER — OXYCODONE-ACETAMINOPHEN 5-325 MG/5ML PO SOLN: 10.0000 mL | Freq: Four times a day (QID) | ORAL | Status: DC | PRN

## 2012-06-28 NOTE — Progress Notes (Signed)
I spoke with Dr. Katrinka Blazing and he feels like he will be okay for discharge on his current medications.  He will follow up with his primary physician next week.

## 2012-06-28 NOTE — Consult Note (Addendum)
Admit date: 06/26/2012 Referring Physician:  Dr. Trude Mcburney Primary Physician:  Kirby Funk, MD Primary Cardiologist:  [facility] Katrinka Blazing, III, MD Reason for Consultation:   Atrial fibrillation  ASSESSMENT: 1. Paroxysmal atrial fibrillation (first documented 2009), likely related to the Tachy-Brady syndrome (marked SB prior to A fib) 2. Hypertension 3. Morbid obesity 4. Remote TIA, 2003 5. 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 Diabetes mellitus 7. Hiatal hernia  PLAN: 1. Resume aspirin 325 mg daily. When safe, start coumadin or surrogate(Xarelto, Pradaxa, or Eliquis), as CHADS2 score is > 2 2. Arrange management of esophageal cancer. If a long wait before surgery, would go ahead and start anticoagulation when safe(related to recent procedure and biopsy). 3. Okay to discharge today 4. We will f/u in 1-2 weeks 5. Resume usual medications at discharge. May need to change Norvasc to diltiazem as OP.   HPI: Pleasant gentleman found to have esophageal cancer during planned [facility] repair and bariatric procedure.   Found to have sinus bradycardia during this hospital stay. He suddenly developed A fib with controlled VR at rest. Heart rate increases to 125-135 bpm range with ambulation in the hall. He was first diagnosed with atrial fibrillation in 2009 by Dr. Roseanne Reno. Echo at that time revealed structurally normal heart with LA size of 4 cm. Prior cardiac w/u in 2001 demonstrated normal coronary arteries and LV function. Chest pain was felt due to reflux and HH.  Faint palpitations which are currently present have been occurring off and on for years.   PMH:   Past Medical History  Diagnosis Date  . Diabetes mellitus   . Hyperlipidemia   . Hypertension   . Morbid obesity   . Anxiety      PSH:   Past Surgical History  Procedure Date  . Hernia repair     2x  . Cholecystectomy left knee arthrospoc   . Knee arthroscopic surgery 1992  . Tonsillectomy as child  .  Hiatal hernia repair 06/26/2012    Procedure: LAPAROSCOPIC REPAIR OF HIATAL HERNIA;  Surgeon: Lodema Pilot, DO;  Location: [facility];  Service: General;;  . Incisional hernia repair 06/26/2012    Procedure: HERNIA REPAIR INCISIONAL;  Surgeon: Lodema Pilot, DO;  Location: [facility];  Service: General;;  . Esophageal biopsy 06/26/2012    Procedure: BIOPSY;  Surgeon: Lodema Pilot, DO;  Location: [facility];  Service: General;;    Allergies:  Codeine Prior to Admit Meds:   Prescriptions prior to admission  Medication Sig Dispense Refill  . FLUoxetine (PROZAC) 20 MG tablet Take 20 mg by mouth every morning.       Marland Kitchen LANTUS 100 UNIT/ML injection Inject 55 Units into the skin every morning.       Marland Kitchen lisinopril-hydrochlorothiazide (PRINZIDE,ZESTORETIC) 20-25 MG per tablet Take 1 tablet by mouth every morning.       Marland Kitchen LORazepam (ATIVAN) 1 MG tablet Take 1 mg by mouth every 8 (eight) hours as needed. For anxiety      . metFORMIN (GLUCOPHAGE-XR) 500 MG 24 hr tablet Take 500 mg by mouth daily with breakfast.       . pioglitazone (ACTOS) 45 MG tablet Take 45 mg by mouth every morning.       . simvastatin (ZOCOR) 40 MG tablet Take 40 mg by mouth every morning.       . vitamin C (ASCORBIC ACID) 500 MG tablet Take 500 mg by mouth daily as needed. When sick      . DISCONTD:  amLODipine (NORVASC) 10 MG tablet Take 10 mg by mouth every morning.       Marland Kitchen DISCONTD: nebivolol (BYSTOLIC) 5 MG tablet Take 5 mg by mouth every morning.       Marland Kitchen albuterol (PROVENTIL HFA;VENTOLIN HFA) 108 (90 BASE) MCG/ACT inhaler Inhale 2 puffs into the lungs every 6 (six) hours as needed. For shortness of breath or wheezing      . aspirin 325 MG tablet Take 325 mg by mouth every morning.       Marland Kitchen atorvastatin (LIPITOR) 20 MG tablet Take 20 mg by mouth every morning.       . B-D INS SYR ULTRAFINE 1CC/30G 30G X 1/2" 1 ML MISC       . cimetidine (TAGAMET HB) 200 MG tablet Take 200 mg by mouth 4 (four) times daily as needed. For upset stomach      .  fluticasone (FLONASE) 50 MCG/ACT nasal spray Place 2 sprays into the nose daily.      Marland Kitchen glimepiride (AMARYL) 2 MG tablet Take 2 mg by mouth daily before breakfast.      . ibuprofen (ADVIL,MOTRIN) 200 MG tablet Take 400 mg by mouth every 6 (six) hours as needed. For pain      . ONE TOUCH ULTRA TEST test strip        Fam HX:    Family History  Problem Relation Age of Onset  . Cancer Father     kidney   Social HX:    History   Social History  . Marital Status: Married    Spouse Name: N/A    Number of Children: N/A  . Years of Education: N/A   Occupational History  . Not on file.   Social History Main Topics  . Smoking status: Never Smoker   . Smokeless tobacco: Never Used  . Alcohol Use: Yes     socially  . Drug Use: No  . Sexually Active:    Other Topics Concern  . Not on file   Social History Narrative  . No narrative on file     Review of Systems: No additional positive,. No recent neurological complaints. H/O TIA 2003  Physical Exam: Blood pressure 119/72, pulse 60, temperature 98.2 F (36.8 C), temperature source Oral, resp. rate 16, height 6\' 1"  (1.854 m), weight 131.543 kg (290 lb), SpO2 97.00%. Weight change:   Obese. Able to lay flat and there. He is in no respiratory distress.  Chest auscultation and percussion.  Cardiac exam reveals irregularly irregular rhythm. No murmur, rub, click, gallop, is heard.  Abdomen soft and nontender.  Extremities reveal no edema. Radial and posterior tibial pulses are 2+ and symmetric.  Neurological exam is intact. The patient is somewhat flaccid/depressed Labs:   Lab Results  Component Value Date   WBC 18.6* 06/27/2012   HGB 13.3 06/27/2012   HCT 38.1* 06/27/2012   MCV 89.0 06/27/2012   PLT 230 06/27/2012    Lab 06/27/12 2350  NA 135  K 3.8  CL 100  CO2 29  BUN 17  CREATININE 0.86  CALCIUM 8.7  PROT 5.8*  BILITOT 0.5  ALKPHOS 65  ALT 38  AST 31  GLUCOSE 135*      Lab Results  Component Value  Date   CHOL 157 04/13/2012   Lab Results  Component Value Date   HDL 40 04/13/2012   Lab Results  Component Value Date   LDLCALC 96 04/13/2012   Lab Results  Component Value Date  TRIG 103 04/13/2012   Lab Results  Component Value Date   CHOLHDL 3.9 04/13/2012   No results found for this basename: LDLDIRECT      Radiology:  No results found. EKG:  None performed. Monitor strips reveal a fib with controlled v rate at rest.    Lesleigh Noe 06/28/2012 5:13 PM

## 2012-06-28 NOTE — Progress Notes (Signed)
Notified Dr Dwain Sarna about patient's heart rate decreasing .  Received order to transfer patient to telemetry unit.

## 2012-06-28 NOTE — Progress Notes (Signed)
2 Days Post-Op  Subjective: Feels okay.  Pain controlled, no nausea and tolerating full liquids. Path returned adenocarcinoma. Asymptomatic from bradycardia.   Objective: Vital signs in last 24 hours: Temp:  [94.6 F (34.8 C)-98.6 F (37 C)] 97.4 F (36.3 C) (10/24 0606) Pulse Rate:  [44-60] 46  (10/24 0606) Resp:  [18] 18  (10/24 0606) BP: (112-130)/(63-72) 124/67 mmHg (10/24 0606) SpO2:  [94 %-99 %] 96 % (10/24 0606) Last BM Date: 06/24/12  Intake/Output from previous day: 10/23 0701 - 10/24 0700 In: 1425 [P.O.:400; I.V.:1025] Out: 1850 [Urine:1850] Intake/Output this shift:    General appearance: alert, cooperative and no distress Resp: clear to auscultation bilaterally Cardio: brady, regular GI: soft, minimal upper abdominal tenderness, ND, wounds without infection, no peritoneal signs  Lab Results:   Basename 06/27/12 0900 06/27/12 0455  WBC 18.6* 15.3*  HGB 13.3 13.3  HCT 38.1* 38.4*  PLT 230 227   BMET  Basename 06/27/12 2350 06/27/12 0455  NA 135 131*  K 3.8 4.6  CL 100 97  CO2 29 27  GLUCOSE 135* 169*  BUN 17 22  CREATININE 0.86 1.10  CALCIUM 8.7 9.0   PT/INR No results found for this basename: LABPROT:2,INR:2 in the last 72 hours ABG No results found for this basename: PHART:2,PCO2:2,PO2:2,HCO3:2 in the last 72 hours  Studies/Results: No results found.  Anti-infectives: Anti-infectives     Start     Dose/Rate Route Frequency Ordered Stop   06/26/12 0528   ertapenem (INVANZ) 1 g in sodium chloride 0.9 % 50 mL IVPB        1 g 100 mL/hr over 30 Minutes Intravenous 60 min pre-op 06/26/12 0528 06/26/12 1024          Assessment/Plan: s/p Procedure(s) (LRB) with comments: UPPER GI ENDOSCOPY () - with biopsy LAPAROSCOPIC REPAIR OF HIATAL HERNIA () HERNIA REPAIR INCISIONAL () BIOPSY () He looks okay from his procedure. No nausea and tolerating diet.  I had a long discussion with him regarding his pathology and expected treatments.  He  otherwise feels fine and the bradycardia is asymptomatic.  I spoke with his physician Dr. Valentina Lucks who will see him next week to follow up and also Dr. Anne Fu who agreed that if he is asymptomatic that he should be okay for discharge with outpatient follow up.  We will walk him on tele and see if we get an appropriate response prior to discharge.  LOS: 2 days    Lawrence [name] 06/28/2012

## 2012-06-28 NOTE — Progress Notes (Signed)
Pt converted to afib.  Dr. Biagio Quint notified.

## 2012-06-29 ENCOUNTER — Other Ambulatory Visit (INDEPENDENT_AMBULATORY_CARE_PROVIDER_SITE_OTHER): Payer: Self-pay

## 2012-06-29 DIAGNOSIS — C159 Malignant neoplasm of esophagus, unspecified: Secondary | ICD-10-CM

## 2012-06-29 NOTE — Discharge Summary (Signed)
Physician Discharge Summary  Patient ID: Lawrence [name]. MRN: [id number] DOB/AGE: 10-Jul-1951 61 y.o.  Admit date: 06/26/2012 Discharge date: 06/29/2012  Admission Diagnoses: morbid obesity  Discharge Diagnoses: esophageal cnacer Active Problems:  Type I (juvenile type) diabetes mellitus with ophthalmic manifestations, not stated as uncontrolled(250.51)  Hypertension, benign  Atrial fibrillation  Esophageal cancer  Hyperlipidemia  Morbid obesity   Discharged Condition: stable  Hospital Course: to OR 06/26/12 for lap sleeve gastrectomy.  He was found to have a hiatal hernia and this was repaired laparoscopically. At the time of his hiatal hernia repair, he was found to have a distal esophageal mass and biopsies were taken. He did not have his sleeve gastrectomy performed due to the findings of the esophageal mass. He was admitted for observation and pain control after surgery. He did well from his procedure and biopsies returned on postop day #1 consistent with invasive adenocarcinoma with signet rings cells features. On postop day #2 cardiology was consult for asymptomatic bradycardia and  Atrial fibrillation. They felt that this was likely a chronic intermittent fibrillation and felt that he was okay for discharge. He was tolerating regular diet and pain control and stable for discharge home on postop day #2  Consults: cardiology  Significant Diagnostic Studies: none  Treatments: surgery: 06/26/12 lap hiatal hernia repair/EGD with biopsies  Disposition: 01-Home or Self Care  Discharge Orders    Future Appointments: Provider: Department: Dept Phone: Center:   07/12/2012 1:15 PM Lodema Pilot, DO [facility] [phone] None     Future Orders Please Complete By Expires   Increase activity slowly      Discharge instructions      Comments:   Follow up with Dr. Valentina Lucks next week as scheduled. Follow up with Dr. Biagio Quint in 2 weeks.  Call [phone] for appointment Follow  up with a thoracic surgeon of your choice for further evaluation and treatment of esophageal cancer.  You can call Dr. Biagio Quint at [phone] for assistance with this. Soft diet for 2 weeks until follow up with Biagio Quint. You may shower normally Go to ER for any irregular heartbeats or weakness or dizziness or shortness of breath or chest pain.   Call MD for:  temperature >100.4      Call MD for:  persistant nausea and vomiting      Call MD for:  severe uncontrolled pain      Call MD for:  redness, tenderness, or signs of infection (pain, swelling, redness, odor or green/yellow discharge around incision site)      Call MD for:  difficulty breathing, headache or visual disturbances      Call MD for:  persistant dizziness or light-headedness      Call MD for:  extreme fatigue          Medication List     As of 06/29/2012  8:18 AM    STOP taking these medications         amLODipine 10 MG tablet   Commonly known as: NORVASC      nebivolol 5 MG tablet   Commonly known as: BYSTOLIC      TAKE these medications         albuterol 108 (90 BASE) MCG/ACT inhaler   Commonly known as: PROVENTIL HFA;VENTOLIN HFA   Inhale 2 puffs into the lungs every 6 (six) hours as needed. For shortness of breath or wheezing      aspirin 325 MG tablet   Take 325 mg by mouth every morning.  atorvastatin 20 MG tablet   Commonly known as: LIPITOR   Take 20 mg by mouth every morning.      B-D INS SYR ULTRAFINE 1CC/30G 30G X 1/2" 1 ML Misc   Generic drug: Insulin Syringe-Needle U-100      FLUoxetine 20 MG tablet   Commonly known as: PROZAC   Take 20 mg by mouth every morning.      fluticasone 50 MCG/ACT nasal spray   Commonly known as: FLONASE   Place 2 sprays into the nose daily.      glimepiride 2 MG tablet   Commonly known as: AMARYL   Take 2 mg by mouth daily before breakfast.      ibuprofen 200 MG tablet   Commonly known as: ADVIL,MOTRIN   Take 400 mg by mouth every 6 (six) hours as needed.  For pain      LANTUS 100 UNIT/ML injection   Generic drug: insulin glargine   Inject 55 Units into the skin every morning.      lisinopril-hydrochlorothiazide 20-25 MG per tablet   Commonly known as: PRINZIDE,ZESTORETIC   Take 1 tablet by mouth every morning.      LORazepam 1 MG tablet   Commonly known as: ATIVAN   Take 1 mg by mouth every 8 (eight) hours as needed. For anxiety      metFORMIN 500 MG 24 hr tablet   Commonly known as: GLUCOPHAGE-XR   Take 500 mg by mouth daily with breakfast.      ONE TOUCH ULTRA TEST test strip   Generic drug: glucose blood      oxyCODONE-acetaminophen 5-325 MG/5ML solution   Commonly known as: ROXICET   Take 10 mLs by mouth every 6 (six) hours as needed.      pioglitazone 45 MG tablet   Commonly known as: ACTOS   Take 45 mg by mouth every morning.      simvastatin 40 MG tablet   Commonly known as: ZOCOR   Take 40 mg by mouth every morning.      TAGAMET HB 200 MG tablet   Generic drug: cimetidine   Take 200 mg by mouth 4 (four) times daily as needed. For upset stomach      vitamin C 500 MG tablet   Commonly known as: ASCORBIC ACID   Take 500 mg by mouth daily as needed. When sick         Signed: Lodema Pilot DAVID 06/29/2012, 8:18 AM

## 2012-07-03 ENCOUNTER — Telehealth (INDEPENDENT_AMBULATORY_CARE_PROVIDER_SITE_OTHER): Payer: Self-pay

## 2012-07-03 ENCOUNTER — Telehealth: Payer: Self-pay | Admitting: Gastroenterology

## 2012-07-03 ENCOUNTER — Other Ambulatory Visit (INDEPENDENT_AMBULATORY_CARE_PROVIDER_SITE_OTHER): Payer: Self-pay

## 2012-07-03 DIAGNOSIS — C159 Malignant neoplasm of esophagus, unspecified: Secondary | ICD-10-CM

## 2012-07-03 NOTE — Telephone Encounter (Signed)
Patient scheduled for PET Scan 07/11/12 @ 6:45 am, [facility], NPO after midnight. Left message for patient to contact our office re: scheduled tests

## 2012-07-04 NOTE — Telephone Encounter (Signed)
Patty, Please schedule him for upper EUS, next week Monday or Tuesday, lunchtime case, does not need propofol. radial only, diagnosis esophageal cancer staging.  Thanks

## 2012-07-04 NOTE — Telephone Encounter (Signed)
Trula Slade,      I spoke to Mr. Keplinger and he has decided to go to [facility] for his medical care. Thank you again for all your help.      Maryan Puls, [facility]

## 2012-07-04 NOTE — Telephone Encounter (Signed)
[facility] will be sending a staff message about the pt to forward to Dr Christella Hartigan

## 2012-07-04 NOTE — Telephone Encounter (Signed)
[name], [name] is scheduled for his PET Scan 07/11/12 @ [facility]. Let me know after Dr. Christella Hartigan reviews Lawrence [name] records. Thank you for all your help. Maryan Puls ----- Message ----- From: Debbe Odea, RN Sent: 07/02/2012 5:11 PM To: Maryan Puls, CMA Dr. Biagio Quint wanted for you to start working on this: Set pt up with Dr. Wendall Papa for an upper endoscopy for esophageal mass and an endoscopic ultrasound. Also needs a PET scan. This is per his telephone consult with Dr. Nydia Bouton. Dx: Esophageal cancer. bp    Please review for EUS

## 2012-07-05 ENCOUNTER — Telehealth (INDEPENDENT_AMBULATORY_CARE_PROVIDER_SITE_OTHER): Payer: Self-pay

## 2012-07-05 ENCOUNTER — Encounter (INDEPENDENT_AMBULATORY_CARE_PROVIDER_SITE_OTHER): Payer: Self-pay

## 2012-07-05 NOTE — Telephone Encounter (Signed)
Ok, thanks.

## 2012-07-05 NOTE — Telephone Encounter (Signed)
Message copied by Maryan Puls on Thu Jul 05, 2012  6:22 PM ------      Message from: Rise Paganini      Created: Thu Jul 05, 2012 11:23 AM      Regarding: Shelva Majestic: [phone]       Patient stated that he will need a note stating that he has been released on our letter head. He would like the note faxed to this number [phone] and state that he was available to work on 07/04/12.Please call to discuss.

## 2012-07-05 NOTE — Telephone Encounter (Signed)
Return to work note faxed at patient request to [phone].

## 2012-07-06 NOTE — Progress Notes (Signed)
Patient scheduled for PET Scan on 07/11/12 @ 7:00 am [employer].  Patient has appointment with Dr. Lorin Picket @ [facility] on 07/11/12.

## 2012-07-11 ENCOUNTER — Encounter (HOSPITAL_COMMUNITY)
Admission: RE | Admit: 2012-07-11 | Discharge: 2012-07-11 | Disposition: A | Discharge status: Home / Self Care | Payer: BC Managed Care – PPO | Source: Ambulatory Visit | Attending: General Surgery | Admitting: General Surgery

## 2012-07-11 DIAGNOSIS — C159 Malignant neoplasm of esophagus, unspecified: Secondary | ICD-10-CM | POA: Insufficient documentation

## 2012-07-11 LAB — GLUCOSE, CAPILLARY: Glucose-Capillary: 125 mg/dL — ABNORMAL HIGH (ref 70–99)

## 2012-07-11 MED ORDER — FLUDEOXYGLUCOSE F - 18 (FDG) INJECTION
20.7000 | Freq: Once | INTRAVENOUS | Status: AC | PRN
  Administered 2012-07-11: 20.7 via INTRAVENOUS

## 2012-07-12 ENCOUNTER — Ambulatory Visit (INDEPENDENT_AMBULATORY_CARE_PROVIDER_SITE_OTHER): Payer: BC Managed Care – PPO | Admitting: General Surgery

## 2012-07-12 ENCOUNTER — Encounter (INDEPENDENT_AMBULATORY_CARE_PROVIDER_SITE_OTHER): Payer: Self-pay | Admitting: General Surgery

## 2012-07-12 VITALS — BP 124/62 | HR 76 | Temp 97.3°F | Resp 16 | Ht 73.0 in | Wt 282.8 lb

## 2012-07-12 DIAGNOSIS — Z4889 Encounter for other specified surgical aftercare: Secondary | ICD-10-CM

## 2012-07-12 DIAGNOSIS — Z5189 Encounter for other specified aftercare: Secondary | ICD-10-CM

## 2012-07-12 MED ORDER — ESOMEPRAZOLE MAGNESIUM 40 MG PO CPDR: 40.0000 mg | DELAYED_RELEASE_CAPSULE | Freq: Every day | ORAL | Status: DC

## 2012-07-12 NOTE — Progress Notes (Signed)
Subjective:     Patient ID: Lawrence Dolly., male   DOB: 23-Feb-1951, 61 y.o.   MRN: [id number]  HPI This patient follows up 2 weeks status post laparoscopic hiatal hernia repair. He was found to have an esophageal cancer at the time of his procedure which was scheduled to be laparoscopic sleeve gastrectomy. We aborted this procedure after finding the esophageal mass. He has recovered very well from this procedure and does not have any discomfort. He does have some reflux which is well controlled with once a day Tagamet. Otherwise he is tolerating his diet. He had a PET scan recently which did not demonstrate any significant findings other than in the area of the distal esophagus. He met with Dr. Lorin Picket at [facility] yesterday for surgical planning and discussion and he is setting him up for endoscopic ultrasound and possible surgery in the next month.  Review of Systems     Objective:   Physical Exam No acute distress nontoxic-appearing His abdomen is soft and nontender on exam his incisions are well-healed without signs of infection.    Assessment:     Status post laparoscopic hiatal hernia repair-doing well He does have some mild reflux which is well controlled with H2 blockers and I prescribe some Nexium to help with this as well. Otherwise, I think he can increase his diet as tolerated to regular diet. He can gradually increase activity as well 2 activity as tolerated. Esophageal cancer He is currently undergoing evaluation and treatment for this at [facility]    Plan:     Nexium daily for reflux Ongoing cancer treatment for esophageal cancer. Followup with me PRN

## 2012-09-07 DIAGNOSIS — E440 Moderate protein-calorie malnutrition: Secondary | ICD-10-CM | POA: Insufficient documentation

## 2012-11-27 ENCOUNTER — Encounter (INDEPENDENT_AMBULATORY_CARE_PROVIDER_SITE_OTHER): Payer: Self-pay

## 2012-12-10 DIAGNOSIS — E860 Dehydration: Secondary | ICD-10-CM | POA: Insufficient documentation

## 2012-12-22 HISTORY — PX: JEJUNOSTOMY FEEDING TUBE: SUR737

## 2013-02-06 DIAGNOSIS — K9413 Enterostomy malfunction: Secondary | ICD-10-CM | POA: Insufficient documentation

## 2013-02-24 DIAGNOSIS — T85528A Displacement of other gastrointestinal prosthetic devices, implants and grafts, initial encounter: Secondary | ICD-10-CM | POA: Insufficient documentation

## 2013-03-14 ENCOUNTER — Ambulatory Visit (INDEPENDENT_AMBULATORY_CARE_PROVIDER_SITE_OTHER): Payer: BC Managed Care – PPO | Admitting: General Surgery

## 2013-03-14 ENCOUNTER — Encounter (INDEPENDENT_AMBULATORY_CARE_PROVIDER_SITE_OTHER): Payer: Self-pay | Admitting: General Surgery

## 2013-03-14 VITALS — BP 110/72 | HR 71 | Temp 97.0°F | Resp 16 | Ht 73.0 in | Wt 219.8 lb

## 2013-03-14 DIAGNOSIS — R229 Localized swelling, mass and lump, unspecified: Secondary | ICD-10-CM

## 2013-03-14 DIAGNOSIS — R220 Localized swelling, mass and lump, head: Secondary | ICD-10-CM

## 2013-03-14 NOTE — Progress Notes (Signed)
Patient ID: Lawrence [name]., male   DOB: [birth year], 62 y.o.   MRN: [id number]  No chief complaint on file.   HPI Lawrence [name]. is a 62 y.o. male.  This patient is known to me for prior eval for weight loss surgery.  He was found to have an esophageal cancer which was T2 N0 lesion.  He  Has undergone esophagectomy which has been complicated with anastomotic leak and pneumonia and other postoperative complications. He currently has esophageal stricture and is being dilated about every 2 weeks. He is currently receiving jejunostomy tube feeds for nutrition. He also has questionable infection in his neck near his spine which was identified on recent PET scan although the results are not available to me.  He comes in today to requesting excision of a left posterior scalp mass which has been present for several years and has not really been changing at all or causing any symptoms, but he would like to have this removed. He is on Xarelto for his atrial fibrillation and he stops this 2 days before any of his dilations. HPI  Past Medical History  Diagnosis Date  . Diabetes mellitus   . Hyperlipidemia   . Hypertension   . Morbid obesity   . Anxiety     Past Surgical History  Procedure Laterality Date  . Hernia repair      2x  . Cholecystectomy  left knee arthrospoc   . Knee arthroscopic surgery  1992  . Tonsillectomy  as child  . Hiatal hernia repair  06/26/2012    Procedure: LAPAROSCOPIC REPAIR OF HIATAL HERNIA;  Surgeon: Lodema Pilot, DO;  Location: [facility];  Service: General;;  . Incisional hernia repair  06/26/2012    Procedure: HERNIA REPAIR INCISIONAL;  Surgeon: Lodema Pilot, DO;  Location: [facility];  Service: General;;  . Esophageal biopsy  06/26/2012    Procedure: BIOPSY;  Surgeon: Lodema Pilot, DO;  Location: [facility];  Service: General;;    Family History  Problem Relation Age of Onset  . Cancer Father     kidney    Social History History  Substance Use Topics   . Smoking status: Never Smoker   . Smokeless tobacco: Never Used  . Alcohol Use: Yes     Comment: socially    Allergies  Allergen Reactions  . Codeine Anxiety    Current Outpatient Prescriptions  Medication Sig Dispense Refill  . aspirin 325 MG tablet Take 325 mg by mouth every morning.       . B-D INS SYR ULTRAFINE 1CC/30G 30G X 1/2" 1 ML MISC       . cimetidine (TAGAMET HB) 200 MG tablet Take 200 mg by mouth 4 (four) times daily as needed. For upset stomach      . esomeprazole (NEXIUM) 40 MG capsule Take 1 capsule (40 mg total) by mouth daily before breakfast.  30 capsule  3  . FLUoxetine (PROZAC) 20 MG tablet Take 20 mg by mouth every morning.       Marland Kitchen glimepiride (AMARYL) 2 MG tablet Take 2 mg by mouth daily before breakfast.      . LANTUS 100 UNIT/ML injection Inject 55 Units into the skin every morning.       Marland Kitchen lisinopril-hydrochlorothiazide (PRINZIDE,ZESTORETIC) 20-25 MG per tablet Take 1 tablet by mouth every morning.       Marland Kitchen LORazepam (ATIVAN) 1 MG tablet Take 1 mg by mouth every 8 (eight) hours as needed. For anxiety      .  metFORMIN (GLUCOPHAGE-XR) 500 MG 24 hr tablet Take 500 mg by mouth daily with breakfast.       . ONE TOUCH ULTRA TEST test strip       . pioglitazone (ACTOS) 45 MG tablet Take 45 mg by mouth every morning.       . simvastatin (ZOCOR) 40 MG tablet Take 40 mg by mouth every morning.        No current facility-administered medications for this visit.    Review of Systems Review of Systems All other review of systems negative or noncontributory except as stated in the HPI  There were no vitals taken for this visit.  Physical Exam Physical Exam Physical Exam  Vitals reviewed. Constitutional: He is oriented to person, place, and time. He appears well-developed and well-nourished. No distress.  HENT:  Head: Normocephalic and atraumatic. Left posterior scalp mass, 2cm and well circumscribed and mobile Mouth/Throat: No oropharyngeal exudate.  Eyes:  Conjunctivae and EOM are normal. Pupils are equal, round, and reactive to light. Right eye exhibits no discharge. Left eye exhibits no discharge. No scleral icterus.  Neck: Normal range of motion. No tracheal deviation present. left neck incision Cardiovascular: Normal rate, regular rhythm and normal heart sounds.   Pulmonary/Chest: Effort normal and breath sounds normal. No stridor. No respiratory distress. He has no wheezes. He has no rales. He exhibits no tenderness.  Abdominal: Soft. Bowel sounds are normal. He exhibits no distension and no mass. There is no tenderness. There is no rebound and no guarding. midline incision with left sided J tube Musculoskeletal: Normal range of motion. He exhibits no edema and no tenderness.  Neurological: He is alert and oriented to person, place, and time.  Skin: Skin is warm and dry. No rash noted. He is not diaphoretic. No erythema. No pallor.  Psychiatric: He has a normal mood and affect. His behavior is normal. Judgment and thought content normal.    Data Reviewed   Assessment    Left scalp mass He has a 2 cm left posterior scalp mass which appears to be a pilar cyst. There is no evidence of infection. I have offered to remove this for him to think would be easily done as an outpatient. However, it would be nice to schedule this after one of his dilations where he is already off of his blood thinners. I discussed with him the risks of the procedure including infection, bleeding, pain, scarring, nerve injury, recurrence, persistent wound and need for wound care and the need for repeat surgeries. He expressed understanding and would like to proceed with excision of scalp mass. He is scheduled to see his oncologist later today for results of the biopsy of the area in his neck which was active on PET scan of a he will come back in a mean of what this shows. If this is infection or tumor then we may need to change our plans    Plan    Hold xarelto for 2  days and plan for excision of scalp mass        Anjani Feuerborn DAVID 03/14/2013, 8:53 AM

## 2013-03-26 ENCOUNTER — Encounter (HOSPITAL_COMMUNITY): Payer: Self-pay | Admitting: Pharmacy Technician

## 2013-04-01 ENCOUNTER — Encounter (HOSPITAL_COMMUNITY): Payer: Self-pay

## 2013-04-01 ENCOUNTER — Encounter (HOSPITAL_COMMUNITY)
Admission: RE | Admit: 2013-04-01 | Discharge: 2013-04-01 | Disposition: A | Discharge status: Home / Self Care | Payer: BC Managed Care – PPO | Source: Ambulatory Visit | Attending: General Surgery | Admitting: General Surgery

## 2013-04-01 VITALS — BP 124/83 | HR 80 | Temp 97.7°F | Resp 16 | Ht 73.0 in | Wt 216.0 lb

## 2013-04-01 DIAGNOSIS — R220 Localized swelling, mass and lump, head: Secondary | ICD-10-CM

## 2013-04-01 HISTORY — DX: Personal history of other diseases of the digestive system: Z87.19

## 2013-04-01 HISTORY — DX: Cardiac arrhythmia, unspecified: I49.9

## 2013-04-01 HISTORY — DX: Gastro-esophageal reflux disease without esophagitis: K21.9

## 2013-04-01 LAB — BASIC METABOLIC PANEL
BUN: 22 mg/dL (ref 6–23)
CO2: 32 mEq/L (ref 19–32)
Chloride: 100 mEq/L (ref 96–112)
Creatinine, Ser: 0.75 mg/dL (ref 0.50–1.35)
GFR calc Af Amer: >90 mL/min (ref >90)
Glucose, Bld: 172 mg/dL — ABNORMAL HIGH (ref 70–99)
Potassium: 4.6 mEq/L (ref 3.5–5.1)

## 2013-04-01 LAB — CBC
HCT: 44.7 % (ref 39.0–52.0)
Hemoglobin: 14.7 g/dL (ref 13.0–17.0)
MCV: 89.2 fL (ref 78.0–100.0)
RBC: 5.01 MIL/uL (ref 4.22–5.81)
RDW: 14 % (ref 11.5–15.5)
WBC: 9.7 10*3/uL (ref 4.0–10.5)

## 2013-04-01 NOTE — Progress Notes (Signed)
If patient needs to lie on stomach for surgery or any other time, pt needs "some kind of cushion for j tube"

## 2013-04-01 NOTE — Patient Instructions (Addendum)
20 Lawrence [name] Fayrene Helper.  04/01/2013   Your procedure is scheduled on: 04/04/13  Report to [facility] at 10:40AM.  Call this number if you have problems the morning of surgery 336-: [phone]   Remember:   Do not eat food or drink liquids After Midnight.     Take these medicines the morning of surgery with A SIP OF WATER: digoxin, prozac, ativan if needed, metoprolol   Do not wear jewelry, make-up or nail polish.  Do not wear lotions, powders, or perfumes. You may wear deodorant.  Do not shave 48 hours prior to surgery. Men may shave face and neck.  Do not bring valuables to the hospital.  Contacts, dentures or bridgework may not be worn into surgery.   Patients discharged the day of surgery will not be allowed to drive home.  Name and phone number of your driver: Lawrence [name] (wife) [phone]   Birdie Sons, RN  pre op nurse call if needed [phone]    FAILURE TO FOLLOW THESE INSTRUCTIONS MAY RESULT IN CANCELLATION OF YOUR SURGERY   Patient Signature: ___________________________________________

## 2013-04-01 NOTE — Progress Notes (Signed)
LOV note Dr. Valentina Lucks 03/06/13 on chart, clinical summary 03/25/13 on chart, PET scan 07/11/12 on [facility], Chest x-ray 06/18/12 on [facility], EKG 06/28/12 on [facility]

## 2013-04-04 ENCOUNTER — Encounter (HOSPITAL_COMMUNITY): Payer: Self-pay | Admitting: Anesthesiology

## 2013-04-04 ENCOUNTER — Encounter (HOSPITAL_COMMUNITY): Payer: Self-pay | Admitting: *Deleted

## 2013-04-04 ENCOUNTER — Ambulatory Visit (HOSPITAL_COMMUNITY)
Admission: RE | Admit: 2013-04-04 | Discharge: 2013-04-04 | Disposition: A | Discharge status: Home / Self Care | Payer: BC Managed Care – PPO | Source: Ambulatory Visit | Attending: General Surgery | Admitting: General Surgery

## 2013-04-04 ENCOUNTER — Encounter (HOSPITAL_COMMUNITY)
Admission: RE | Disposition: A | Discharge status: Home / Self Care | Payer: Self-pay | Source: Ambulatory Visit | Attending: General Surgery

## 2013-04-04 ENCOUNTER — Ambulatory Visit (HOSPITAL_COMMUNITY): Payer: BC Managed Care – PPO | Admitting: Anesthesiology

## 2013-04-04 DIAGNOSIS — E785 Hyperlipidemia, unspecified: Secondary | ICD-10-CM | POA: Insufficient documentation

## 2013-04-04 DIAGNOSIS — Z8501 Personal history of malignant neoplasm of esophagus: Secondary | ICD-10-CM | POA: Insufficient documentation

## 2013-04-04 DIAGNOSIS — R220 Localized swelling, mass and lump, head: Secondary | ICD-10-CM

## 2013-04-04 DIAGNOSIS — I10 Essential (primary) hypertension: Secondary | ICD-10-CM | POA: Insufficient documentation

## 2013-04-04 DIAGNOSIS — Z794 Long term (current) use of insulin: Secondary | ICD-10-CM | POA: Insufficient documentation

## 2013-04-04 DIAGNOSIS — I4891 Unspecified atrial fibrillation: Secondary | ICD-10-CM | POA: Insufficient documentation

## 2013-04-04 DIAGNOSIS — Z8673 Personal history of transient ischemic attack (TIA), and cerebral infarction without residual deficits: Secondary | ICD-10-CM | POA: Insufficient documentation

## 2013-04-04 DIAGNOSIS — Z7901 Long term (current) use of anticoagulants: Secondary | ICD-10-CM | POA: Insufficient documentation

## 2013-04-04 DIAGNOSIS — E119 Type 2 diabetes mellitus without complications: Secondary | ICD-10-CM | POA: Insufficient documentation

## 2013-04-04 DIAGNOSIS — D234 Other benign neoplasm of skin of scalp and neck: Secondary | ICD-10-CM | POA: Insufficient documentation

## 2013-04-04 DIAGNOSIS — Z7982 Long term (current) use of aspirin: Secondary | ICD-10-CM | POA: Insufficient documentation

## 2013-04-04 DIAGNOSIS — Z79899 Other long term (current) drug therapy: Secondary | ICD-10-CM | POA: Insufficient documentation

## 2013-04-04 DIAGNOSIS — Z885 Allergy status to narcotic agent status: Secondary | ICD-10-CM | POA: Insufficient documentation

## 2013-04-04 DIAGNOSIS — K222 Esophageal obstruction: Secondary | ICD-10-CM | POA: Insufficient documentation

## 2013-04-04 DIAGNOSIS — Z9089 Acquired absence of other organs: Secondary | ICD-10-CM | POA: Insufficient documentation

## 2013-04-04 DIAGNOSIS — F411 Generalized anxiety disorder: Secondary | ICD-10-CM | POA: Insufficient documentation

## 2013-04-04 HISTORY — PX: MASS EXCISION: SHX2000

## 2013-04-04 LAB — GLUCOSE, CAPILLARY

## 2013-04-04 SURGERY — EXCISION MASS
Anesthesia: General | Site: Head | Laterality: Left | Wound class: Clean Contaminated

## 2013-04-04 MED ORDER — GLYCOPYRROLATE 0.2 MG/ML IJ SOLN
INTRAMUSCULAR | Status: DC | PRN
  Administered 2013-04-04: .7 mg via INTRAVENOUS

## 2013-04-04 MED ORDER — OXYCODONE HCL 5 MG PO TABS: 5.0000 mg | ORAL_TABLET | Freq: Once | ORAL | Status: DC | PRN

## 2013-04-04 MED ORDER — HYDROMORPHONE HCL PF 1 MG/ML IJ SOLN
0.2500 mg | INTRAMUSCULAR | Status: DC | PRN
Start: 2013-04-04 — End: 2013-04-04

## 2013-04-04 MED ORDER — LACTATED RINGERS IV SOLN
INTRAVENOUS | Status: DC
  Administered 2013-04-04: 16:00:00 via INTRAVENOUS
  Administered 2013-04-04: 1000 mL via INTRAVENOUS

## 2013-04-04 MED ORDER — BACITRACIN ZINC 500 UNIT/GM EX OINT
TOPICAL_OINTMENT | CUTANEOUS | Status: AC
  Filled 2013-04-04: qty 15

## 2013-04-04 MED ORDER — ONDANSETRON HCL 4 MG/2ML IJ SOLN
INTRAMUSCULAR | Status: DC | PRN
  Administered 2013-04-04: 4 mg via INTRAVENOUS

## 2013-04-04 MED ORDER — OXYCODONE HCL 5 MG/5ML PO SOLN
5.0000 mg | Freq: Once | ORAL | Status: DC | PRN
  Filled 2013-04-04: qty 5

## 2013-04-04 MED ORDER — LIDOCAINE-EPINEPHRINE (PF) 1 %-1:200000 IJ SOLN
INTRAMUSCULAR | Status: AC
  Filled 2013-04-04: qty 10

## 2013-04-04 MED ORDER — CEFAZOLIN SODIUM-DEXTROSE 2-3 GM-% IV SOLR
INTRAVENOUS | Status: AC
  Filled 2013-04-04: qty 50

## 2013-04-04 MED ORDER — LIDOCAINE-EPINEPHRINE (PF) 1 %-1:200000 IJ SOLN
INTRAMUSCULAR | Status: DC | PRN
  Administered 2013-04-04: 8 mL

## 2013-04-04 MED ORDER — NEOSTIGMINE METHYLSULFATE 1 MG/ML IJ SOLN
INTRAMUSCULAR | Status: DC | PRN
  Administered 2013-04-04: 4 mg via INTRAVENOUS

## 2013-04-04 MED ORDER — PROMETHAZINE HCL 25 MG/ML IJ SOLN: 6.2500 mg | INTRAMUSCULAR | Status: DC | PRN

## 2013-04-04 MED ORDER — EPHEDRINE SULFATE 50 MG/ML IJ SOLN
INTRAMUSCULAR | Status: DC | PRN
  Administered 2013-04-04: 5 mg via INTRAVENOUS

## 2013-04-04 MED ORDER — SUCCINYLCHOLINE CHLORIDE 20 MG/ML IJ SOLN
INTRAMUSCULAR | Status: DC | PRN
  Administered 2013-04-04: 100 mg via INTRAVENOUS

## 2013-04-04 MED ORDER — PHENYLEPHRINE HCL 10 MG/ML IJ SOLN
INTRAMUSCULAR | Status: DC | PRN
  Administered 2013-04-04 (×2): 40 ug via INTRAVENOUS

## 2013-04-04 MED ORDER — 0.9 % SODIUM CHLORIDE (POUR BTL) OPTIME
TOPICAL | Status: DC | PRN
  Administered 2013-04-04: 1000 mL

## 2013-04-04 MED ORDER — MEPERIDINE HCL 50 MG/ML IJ SOLN: 6.2500 mg | INTRAMUSCULAR | Status: DC | PRN

## 2013-04-04 MED ORDER — BACITRACIN ZINC 500 UNIT/GM EX OINT
TOPICAL_OINTMENT | CUTANEOUS | Status: DC | PRN
  Administered 2013-04-04: 1 via TOPICAL

## 2013-04-04 MED ORDER — CEFAZOLIN SODIUM-DEXTROSE 2-3 GM-% IV SOLR
2.0000 g | INTRAVENOUS | Status: AC
  Administered 2013-04-04: 2 g via INTRAVENOUS

## 2013-04-04 MED ORDER — LIDOCAINE HCL 1 % IJ SOLN
INTRAMUSCULAR | Status: DC | PRN
  Administered 2013-04-04: 80 mg via INTRADERMAL

## 2013-04-04 MED ORDER — BUPIVACAINE HCL (PF) 0.25 % IJ SOLN
INTRAMUSCULAR | Status: AC
  Filled 2013-04-04: qty 30

## 2013-04-04 MED ORDER — MIDAZOLAM HCL 5 MG/5ML IJ SOLN
INTRAMUSCULAR | Status: DC | PRN
  Administered 2013-04-04: 2 mg via INTRAVENOUS

## 2013-04-04 MED ORDER — FENTANYL CITRATE 0.05 MG/ML IJ SOLN
INTRAMUSCULAR | Status: DC | PRN
  Administered 2013-04-04: 100 ug via INTRAVENOUS

## 2013-04-04 MED ORDER — PROPOFOL 10 MG/ML IV BOLUS
INTRAVENOUS | Status: DC | PRN
  Administered 2013-04-04: 160 mg via INTRAVENOUS

## 2013-04-04 MED ORDER — ROCURONIUM BROMIDE 100 MG/10ML IV SOLN
INTRAVENOUS | Status: DC | PRN
  Administered 2013-04-04: 30 mg via INTRAVENOUS

## 2013-04-04 SURGICAL SUPPLY — 48 items
APL SKNCLS STERI-STRIP NONHPOA (GAUZE/BANDAGES/DRESSINGS)
BANDAGE GAUZE ELAST BULKY 4 IN (GAUZE/BANDAGES/DRESSINGS) ×1 IMPLANT
BENZOIN TINCTURE PRP APPL 2/3 (GAUZE/BANDAGES/DRESSINGS) IMPLANT
BLADE HEX COATED 2.75 (ELECTRODE) ×2 IMPLANT
BLADE SURG SZ10 CARB STEEL (BLADE) ×4 IMPLANT
CANISTER SUCTION 2500CC (MISCELLANEOUS) ×2 IMPLANT
CLOTH BEACON ORANGE TIMEOUT ST (SAFETY) ×2 IMPLANT
DECANTER SPIKE VIAL GLASS SM (MISCELLANEOUS) IMPLANT
DRAPE LAPAROTOMY T 102X78X121 (DRAPES) ×1 IMPLANT
DRAPE LAPAROTOMY TRNSV 102X78 (DRAPE) IMPLANT
DRAPE LG THREE QUARTER DISP (DRAPES) IMPLANT
ELECT REM PT RETURN 9FT ADLT (ELECTROSURGICAL) ×2
ELECTRODE REM PT RTRN 9FT ADLT (ELECTROSURGICAL) ×1 IMPLANT
EVACUATOR SILICONE 100CC (DRAIN) IMPLANT
GLOVE BIOGEL PI IND STRL 7.0 (GLOVE) ×1 IMPLANT
GLOVE BIOGEL PI IND STRL 7.5 (GLOVE) IMPLANT
GLOVE BIOGEL PI INDICATOR 7.0 (GLOVE) ×1
GLOVE BIOGEL PI INDICATOR 7.5 (GLOVE) ×1
GLOVE ECLIPSE 8.0 STRL XLNG CF (GLOVE) ×1 IMPLANT
GLOVE INDICATOR 8.0 STRL GRN (GLOVE) ×2 IMPLANT
GLOVE SURG SS PI 6.5 STRL IVOR (GLOVE) ×1 IMPLANT
GLOVE SURG SS PI 8.0 STRL IVOR (GLOVE) ×2 IMPLANT
GOWN STRL NON-REIN LRG LVL3 (GOWN DISPOSABLE) ×2 IMPLANT
GOWN STRL REIN XL XLG (GOWN DISPOSABLE) ×4 IMPLANT
KIT BASIN OR (CUSTOM PROCEDURE TRAY) ×2 IMPLANT
MARKER SKIN DUAL TIP RULER LAB (MISCELLANEOUS) IMPLANT
NDL HYPO 25X1 1.5 SAFETY (NEEDLE) ×1 IMPLANT
NEEDLE HYPO 25X1 1.5 SAFETY (NEEDLE) ×2 IMPLANT
NS IRRIG 1000ML POUR BTL (IV SOLUTION) ×2 IMPLANT
PACK BASIC VI WITH GOWN DISP (CUSTOM PROCEDURE TRAY) ×2 IMPLANT
PENCIL BUTTON HOLSTER BLD 10FT (ELECTRODE) ×2 IMPLANT
SOL PREP POV-IOD 16OZ 10% (MISCELLANEOUS) ×1 IMPLANT
SPONGE GAUZE 4X4 12PLY (GAUZE/BANDAGES/DRESSINGS) ×2 IMPLANT
SPONGE LAP 18X18 X RAY DECT (DISPOSABLE) ×1 IMPLANT
SPONGE LAP 4X18 X RAY DECT (DISPOSABLE) ×1 IMPLANT
STAPLER VISISTAT 35W (STAPLE) IMPLANT
SUT ETHILON 2 0 PS N (SUTURE) ×1 IMPLANT
SUT MNCRL AB 4-0 PS2 18 (SUTURE) IMPLANT
SUT VIC AB 3-0 SH 27 (SUTURE)
SUT VIC AB 3-0 SH 27XBRD (SUTURE) IMPLANT
SUT VIC AB 4-0 RB1 27 (SUTURE) ×2
SUT VIC AB 4-0 RB1 27XBRD (SUTURE) IMPLANT
SUT VICRYL 0 UR6 27IN ABS (SUTURE) IMPLANT
SYR CONTROL 10ML LL (SYRINGE) ×2 IMPLANT
TAPE SURG TRANSPORE 1 IN (GAUZE/BANDAGES/DRESSINGS) IMPLANT
TAPE SURGICAL TRANSPORE 1 IN (GAUZE/BANDAGES/DRESSINGS) ×1
TOWEL OR 17X26 10 PK STRL BLUE (TOWEL DISPOSABLE) ×2 IMPLANT
YANKAUER SUCT BULB TIP 10FT TU (MISCELLANEOUS) IMPLANT

## 2013-04-04 NOTE — Anesthesia Procedure Notes (Signed)
Procedure Name: Intubation Date/Time: 04/04/2013 2:32 PM Performed by: Hulan Fess Pre-anesthesia Checklist: Patient identified, Emergency Drugs available, Suction available, Patient being monitored and Timeout performed Patient Re-evaluated:Patient Re-evaluated prior to inductionOxygen Delivery Method: Circle system utilized Preoxygenation: Pre-oxygenation with 100% oxygen Intubation Type: IV induction Ventilation: Mask ventilation without difficulty Laryngoscope Size: Mac and 3 Grade View: Grade I Tube type: Oral Tube size: 8.0 mm Number of attempts: 1 Placement Confirmation: ETT inserted through vocal cords under direct vision Secured at: 22 cm Tube secured with: Tape Dental Injury: Teeth and Oropharynx as per pre-operative assessment

## 2013-04-04 NOTE — H&P (View-Only) (Signed)
Patient ID: Lawrence Donald Mauney Jr., male   DOB: 07/09/1951, 62 y.o.   MRN: [id number]  No chief complaint on file.   HPI Lawrence Donald Switalski Jr. is a 62 y.o. male.  This patient is known to me for prior eval for weight loss surgery.  He was found to have an esophageal cancer which was T2 N0 lesion.  He  Has undergone esophagectomy which has been complicated with anastomotic leak and pneumonia and other postoperative complications. He currently has esophageal stricture and is being dilated about every 2 weeks. He is currently receiving jejunostomy tube feeds for nutrition. He also has questionable infection in his neck near his spine which was identified on recent PET scan although the results are not available to me.  He comes in today to requesting excision of a left posterior scalp mass which has been present for several years and has not really been changing at all or causing any symptoms, but he would like to have this removed. He is on Xarelto for his atrial fibrillation and he stops this 2 days before any of his dilations. HPI  Past Medical History  Diagnosis Date  . Diabetes mellitus   . Hyperlipidemia   . Hypertension   . Morbid obesity   . Anxiety     Past Surgical History  Procedure Laterality Date  . Hernia repair      2x  . Cholecystectomy  left knee arthrospoc   . Knee arthroscopic surgery  1992  . Tonsillectomy  as child  . Hiatal hernia repair  06/26/2012    Procedure: LAPAROSCOPIC REPAIR OF HIATAL HERNIA;  Surgeon: Zahari Xiang, DO;  Location: [facility];  Service: General;;  . Incisional hernia repair  06/26/2012    Procedure: HERNIA REPAIR INCISIONAL;  Surgeon: Desree Leap, DO;  Location: [facility];  Service: General;;  . Esophageal biopsy  06/26/2012    Procedure: BIOPSY;  Surgeon: Fredric Slabach, DO;  Location: [facility];  Service: General;;    Family History  Problem Relation Age of Onset  . Cancer Father     kidney    Social History History  Substance Use Topics   . Smoking status: Never Smoker   . Smokeless tobacco: Never Used  . Alcohol Use: Yes     Comment: socially    Allergies  Allergen Reactions  . Codeine Anxiety    Current Outpatient Prescriptions  Medication Sig Dispense Refill  . aspirin 325 MG tablet Take 325 mg by mouth every morning.       . B-D INS SYR ULTRAFINE 1CC/30G 30G X 1/2" 1 ML MISC       . cimetidine (TAGAMET HB) 200 MG tablet Take 200 mg by mouth 4 (four) times daily as needed. For upset stomach      . esomeprazole (NEXIUM) 40 MG capsule Take 1 capsule (40 mg total) by mouth daily before breakfast.  30 capsule  3  . FLUoxetine (PROZAC) 20 MG tablet Take 20 mg by mouth every morning.       . glimepiride (AMARYL) 2 MG tablet Take 2 mg by mouth daily before breakfast.      . LANTUS 100 UNIT/ML injection Inject 55 Units into the skin every morning.       . lisinopril-hydrochlorothiazide (PRINZIDE,ZESTORETIC) 20-25 MG per tablet Take 1 tablet by mouth every morning.       . LORazepam (ATIVAN) 1 MG tablet Take 1 mg by mouth every 8 (eight) hours as needed. For anxiety      .   metFORMIN (GLUCOPHAGE-XR) 500 MG 24 hr tablet Take 500 mg by mouth daily with breakfast.       . ONE TOUCH ULTRA TEST test strip       . pioglitazone (ACTOS) 45 MG tablet Take 45 mg by mouth every morning.       . simvastatin (ZOCOR) 40 MG tablet Take 40 mg by mouth every morning.        No current facility-administered medications for this visit.    Review of Systems Review of Systems All other review of systems negative or noncontributory except as stated in the HPI  There were no vitals taken for this visit.  Physical Exam Physical Exam Physical Exam  Vitals reviewed. Constitutional: He is oriented to person, place, and time. He appears well-developed and well-nourished. No distress.  HENT:  Head: Normocephalic and atraumatic. Left posterior scalp mass, 2cm and well circumscribed and mobile Mouth/Throat: No oropharyngeal exudate.  Eyes:  Conjunctivae and EOM are normal. Pupils are equal, round, and reactive to light. Right eye exhibits no discharge. Left eye exhibits no discharge. No scleral icterus.  Neck: Normal range of motion. No tracheal deviation present. left neck incision Cardiovascular: Normal rate, regular rhythm and normal heart sounds.   Pulmonary/Chest: Effort normal and breath sounds normal. No stridor. No respiratory distress. He has no wheezes. He has no rales. He exhibits no tenderness.  Abdominal: Soft. Bowel sounds are normal. He exhibits no distension and no mass. There is no tenderness. There is no rebound and no guarding. midline incision with left sided J tube Musculoskeletal: Normal range of motion. He exhibits no edema and no tenderness.  Neurological: He is alert and oriented to person, place, and time.  Skin: Skin is warm and dry. No rash noted. He is not diaphoretic. No erythema. No pallor.  Psychiatric: He has a normal mood and affect. His behavior is normal. Judgment and thought content normal.    Data Reviewed   Assessment    Left scalp mass He has a 2 cm left posterior scalp mass which appears to be a pilar cyst. There is no evidence of infection. I have offered to remove this for him to think would be easily done as an outpatient. However, it would be nice to schedule this after one of his dilations where he is already off of his blood thinners. I discussed with him the risks of the procedure including infection, bleeding, pain, scarring, nerve injury, recurrence, persistent wound and need for wound care and the need for repeat surgeries. He expressed understanding and would like to proceed with excision of scalp mass. He is scheduled to see his oncologist later today for results of the biopsy of the area in his neck which was active on PET scan of a he will come back in a mean of what this shows. If this is infection or tumor then we may need to change our plans    Plan    Hold xarelto for 2  days and plan for excision of scalp mass        Grace Haggart DAVID 03/14/2013, 8:53 AM    

## 2013-04-04 NOTE — Transfer of Care (Signed)
Immediate Anesthesia Transfer of Care Note  Patient: Lawrence [name].  Procedure(s) Performed: Procedure(s) (LRB): EXCISION OF SCALP MASS (Left)  Patient Location: PACU  Anesthesia Type: General  Level of Consciousness: sedated, patient cooperative and responds to stimulaton  Airway & Oxygen Therapy: Patient Spontanous Breathing and Patient connected to face mask oxgen  Post-op Assessment: Report given to PACU RN and Post -op Vital signs reviewed and stable  Post vital signs: Reviewed and stable  Complications: No apparent anesthesia complications

## 2013-04-04 NOTE — Anesthesia Postprocedure Evaluation (Signed)
  Anesthesia Post-op Note  Patient: Lawrence [name].  Procedure(s) Performed: Procedure(s) (LRB): EXCISION OF SCALP MASS (Left)  Patient Location: PACU  Anesthesia Type: General  Level of Consciousness: awake and alert   Airway and Oxygen Therapy: Patient Spontanous Breathing  Post-op Pain: mild  Post-op Assessment: Post-op Vital signs reviewed, Patient's Cardiovascular Status Stable, Respiratory Function Stable, Patent Airway and No signs of Nausea or vomiting  Last Vitals:  Filed Vitals:   04/04/13 1615  BP: 117/55  Pulse: 66  Temp:   Resp: 16    Post-op Vital Signs: stable   Complications: No apparent anesthesia complications

## 2013-04-04 NOTE — Anesthesia Preprocedure Evaluation (Addendum)
Anesthesia Evaluation  Patient identified by MRN, date of birth, ID band Patient awake    Reviewed: Allergy & Precautions, H&P , NPO status , Patient's Chart, lab work & pertinent test results, reviewed documented beta blocker date and time   Airway Mallampati: II TM Distance: <3 FB Neck ROM: Full    Dental no notable dental hx. (+) Dental Advisory Given   Pulmonary neg pulmonary ROS,  breath sounds clear to auscultation  Pulmonary exam normal       Cardiovascular hypertension, Pt. on medications and Pt. on home beta blockers + dysrhythmias Atrial Fibrillation Rhythm:Regular Rate:Normal     Neuro/Psych Anxiety TIA   GI/Hepatic Neg liver ROS, hiatal hernia, GERD-  ,  Endo/Other  diabetes, Type 2, Insulin DependentMorbid obesity  Renal/GU negative Renal ROS     Musculoskeletal negative musculoskeletal ROS (+)   Abdominal   Peds  Hematology negative hematology ROS (+)   Anesthesia Other Findings   Reproductive/Obstetrics                          Anesthesia Physical Anesthesia Plan  ASA: III  Anesthesia Plan: MAC and General   Post-op Pain Management:    Induction: Intravenous  Airway Management Planned:   Additional Equipment:   Intra-op Plan:   Post-operative Plan:   Informed Consent: I have reviewed the patients History and Physical, chart, labs and discussed the procedure including the risks, benefits and alternatives for the proposed anesthesia with the patient or authorized representative who has indicated his/her understanding and acceptance.   Dental advisory given  Plan Discussed with: CRNA  Anesthesia Plan Comments:        Anesthesia Quick Evaluation                                   Anesthesia Evaluation  Patient identified by MRN, date of birth, ID band Patient awake    Reviewed: Allergy & Precautions, H&P , NPO status , Patient's Chart, lab work & pertinent  test results  Airway Mallampati: II TM Distance: <3 FB Neck ROM: Full    Dental No notable dental hx.    Pulmonary neg pulmonary ROS,  breath sounds clear to auscultation  Pulmonary exam normal       Cardiovascular hypertension, Rhythm:Regular Rate:Normal     Neuro/Psych Anxiety negative neurological ROS     GI/Hepatic negative GI ROS, Neg liver ROS,   Endo/Other  diabetesMorbid obesity  Renal/GU negative Renal ROS  negative genitourinary   Musculoskeletal negative musculoskeletal ROS (+)   Abdominal   Peds negative pediatric ROS (+)  Hematology negative hematology ROS (+)   Anesthesia Other Findings   Reproductive/Obstetrics negative OB ROS                           Anesthesia Physical Anesthesia Plan  ASA: III  Anesthesia Plan: General   Post-op Pain Management:    Induction: Intravenous  Airway Management Planned: Oral ETT  Additional Equipment:   Intra-op Plan:   Post-operative Plan: Extubation in OR  Informed Consent: I have reviewed the patients History and Physical, chart, labs and discussed the procedure including the risks, benefits and alternatives for the proposed anesthesia with the patient or authorized representative who has indicated his/her understanding and acceptance.   Dental advisory given  Plan Discussed with: CRNA and Surgeon  Anesthesia Plan Comments:  Anesthesia Quick Evaluation  

## 2013-04-04 NOTE — Interval H&P Note (Signed)
History and Physical Interval Note:  04/04/2013 1:57 PM  Lawrence [name].  has presented today for surgery, with the diagnosis of scalp mass  The various methods of treatment have been discussed with the patient and family. After consideration of risks, benefits and other options for treatment, the patient has consented to  Procedure(s): EXCISION of scalp MASS (N/A) as a surgical intervention .  The patient's history has been reviewed, patient examined, no change in status, stable for surgery.  I have reviewed the patient's chart and labs.  Questions were answered to the patient's satisfaction.  Seen and examined in the preop area.  Site marked with the patient.  Risks of infection, bleeding, pain, scarring, recurrence, hair loss, poor cosmesis, and inability to close the wound discussed with the patient and he expressed understanding and desires to proceed with excision of left sided scalp mass  Madolin Twaddle DAVID

## 2013-04-04 NOTE — Op Note (Signed)
NAMEDAMASCUS, FELDPAUSCH               ACCOUNT NO.:  [id number]  MEDICAL RECORD NO.:  [id number]  LOCATION:  [facility]                         FACILITY:  [facility]  PHYSICIAN:  Lodema Pilot, MD       DATE OF BIRTH:  September 30, 1950  DATE OF PROCEDURE:  04/04/2013 DATE OF DISCHARGE:  04/04/2013                              OPERATIVE REPORT   PROCEDURE:  Excision of left scalp mass.  PREOPERATIVE DIAGNOSIS:  Scalp mass.  POSTOPERATIVE DIAGNOSIS:  Scalp mass.  SURGEON:  Lodema Pilot, MD  ASSISTANT:  None.  ANESTHESIA:  General endotracheal tube anesthesia and 8 mL of 1% lidocaine with epinephrine.  FLUIDS:  One liter of crystalloid.  ESTIMATED BLOOD LOSS:  Minimal.  DRAINS:  None.  SPECIMENS:  Left scalp cyst measuring 5 cm x 3 cm sent to Pathology for permanent sectioning.  COMPLICATIONS:  None apparent.  FINDINGS:  5 cm x 3 cm left scalp mass, sent to Pathology for permanent sectioning.  The cyst was not entered during the dissection.  INDICATION FOR PROCEDURE:  Mr. Shirer is a 62 year old male, known to me for prior hiatal hernia repair and he has a recent history of esophageal cancer and came back to me for excision of a left scalp mass, which has been present for many years.  OPERATIVE DETAILS:  Mr. Labrosse was seen and evaluated in the preoperative area and risks and benefits of the procedure were again discussed in lay terms.  Informed consent was obtained.  The surgical site was marked prior to anesthetic administration.  He was taken to the operating room and placed on the table in supine position.  General endotracheal tube anesthesia was obtained and then he was flipped in the prone position.  The area was shaved and prepped and draped in a standard surgical fashion.  Procedure time-out was performed with all operative team members to confirm patient, procedure, and an elliptical incision was made over the palpable mass.  Dissection was carried down to the wall of the cyst  and then using sharp dissection stayed along the wall and dissected this free on one side and this was repeated on the contralateral side until I was around the cystic duct and then undermined the lesion and it was completely removed and sent to Pathology for permanent sectioning.  Again, the cysts was not entered during the dissection.  A 4-0 Vicryl suture was used to approximate the fascia.  There was a small opening of the fascia, this was approximated with Vicryl suture.  The dermis was then also approximated with interrupted 4-0 Vicryl sutures and skin edges were approximated with 2-0 nylon vertical mattress sutures.  The skin was washed and dried. Bacitracin ointment was applied and the sterile dressing was applied. All sponge, needle, and instrument counts were correct at the end of the case, and the patient tolerated the procedure well without apparent complications.          ______________________________ Lodema Pilot, MD     BL/MEDQ  D:  04/04/2013  T:  04/04/2013  Job:  [id number]

## 2013-04-04 NOTE — Brief Op Note (Signed)
04/04/2013  3:51 PM  PATIENT:  Lawrence [name].  62 y.o. male  PRE-OPERATIVE DIAGNOSIS:  scalp mass  POST-OPERATIVE DIAGNOSIS:  scalp mass on left side  PROCEDURE:  Procedure(s): EXCISION OF SCALP MASS (Left)  SURGEON:  Surgeon(s) and Role:    * Lodema Pilot, DO - Primary  PHYSICIAN ASSISTANT:   ASSISTANTS: none   ANESTHESIA:   general  EBL:  Total I/O In: 900 [I.V.:900] Out: -   BLOOD ADMINISTERED:none  DRAINS: none   LOCAL MEDICATIONS USED:  LIDOCAINE   SPECIMEN:  Source of Specimen:  left scalp mass  DISPOSITION OF SPECIMEN:  PATHOLOGY  COUNTS:  YES  TOURNIQUET:  * No tourniquets in log *  DICTATION: .Other Dictation: Dictation Number [id number]  PLAN OF CARE: Discharge to home after PACU  PATIENT DISPOSITION:  PACU - hemodynamically stable.   Delay start of Pharmacological VTE agent (>24hrs) due to surgical blood loss or risk of bleeding: no

## 2013-04-05 ENCOUNTER — Encounter (HOSPITAL_COMMUNITY): Payer: Self-pay | Admitting: General Surgery

## 2013-04-15 ENCOUNTER — Encounter (INDEPENDENT_AMBULATORY_CARE_PROVIDER_SITE_OTHER): Payer: BC Managed Care – PPO

## 2013-04-16 ENCOUNTER — Ambulatory Visit (INDEPENDENT_AMBULATORY_CARE_PROVIDER_SITE_OTHER): Payer: BC Managed Care – PPO

## 2013-04-16 VITALS — BP 114/76 | HR 60 | Temp 98.0°F | Resp 18

## 2013-04-16 DIAGNOSIS — Z4802 Encounter for removal of sutures: Secondary | ICD-10-CM

## 2013-04-16 NOTE — Progress Notes (Signed)
Patient is in for nurse only;  scalp sutures intact no  Redness,drainage,odor noted; cleansed area with cholera prep; removed x4 sutures; cleansed area with cholera prep; applied bandage to area; patient tolerated well advised keep area clean /dry  to call if any  temp,serous drainage, odor redness. Patient verbalized understanding

## 2013-04-24 ENCOUNTER — Encounter (INDEPENDENT_AMBULATORY_CARE_PROVIDER_SITE_OTHER): Payer: BC Managed Care – PPO

## 2013-04-28 IMAGING — RF DG UGI W/ KUB
14 of 16 series · 14 of 16 positions shown · non-contrast
Comparison: CT abdomen pelvis 12/01/2004

CLINICAL DATA: Pre-op for bariatric surgery.

SINGLE CONTRAST UPPER GI SERIES WITH KUB
TECHNIQUE: Single contrast upper GI series was performed with
barium.
Fluoroscopy Time: 2.3 minutes

[Series 1: run · 1 of 1 slices shown (1 of 12)]
[im 1/1]
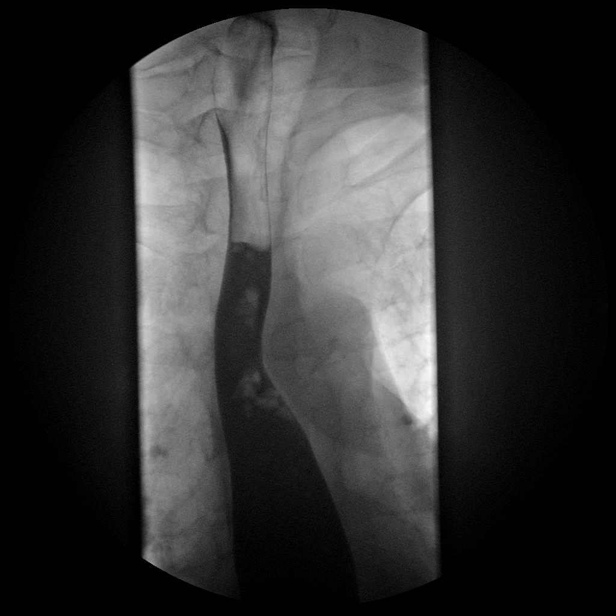

[Series 2: run · 1 of 1 slices shown (2 of 12)]
[im 1/1]
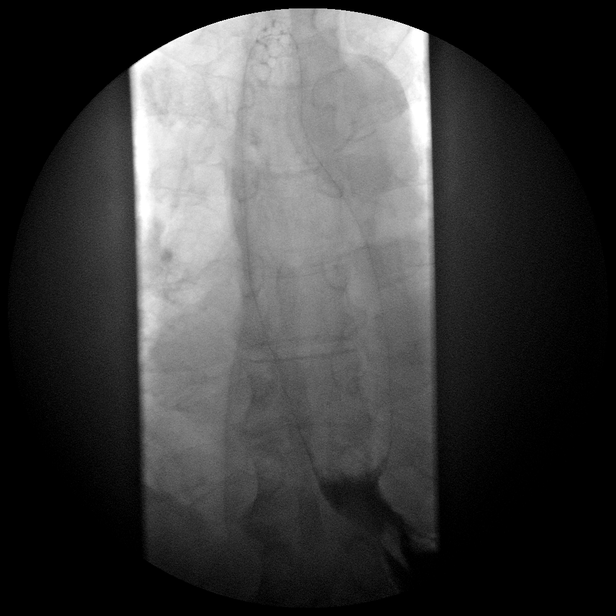

[Series 3: run · 1 of 1 slices shown (3 of 12)]
[im 1/1]
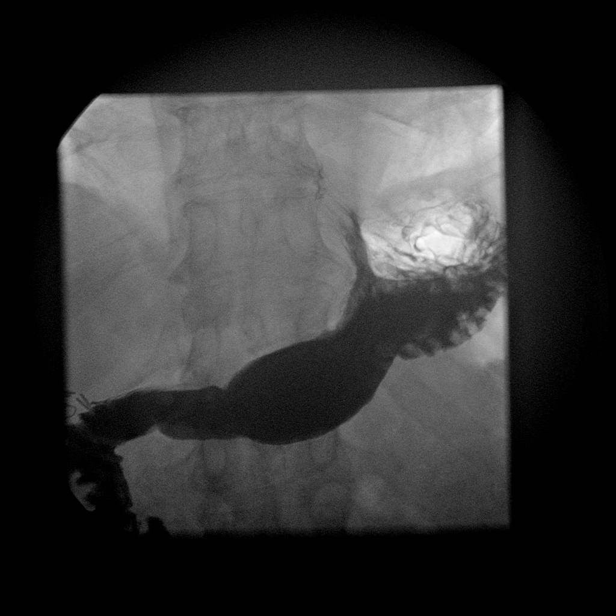

[Series 5: run · 1 of 1 slices shown (4 of 12)]
[im 1/1]
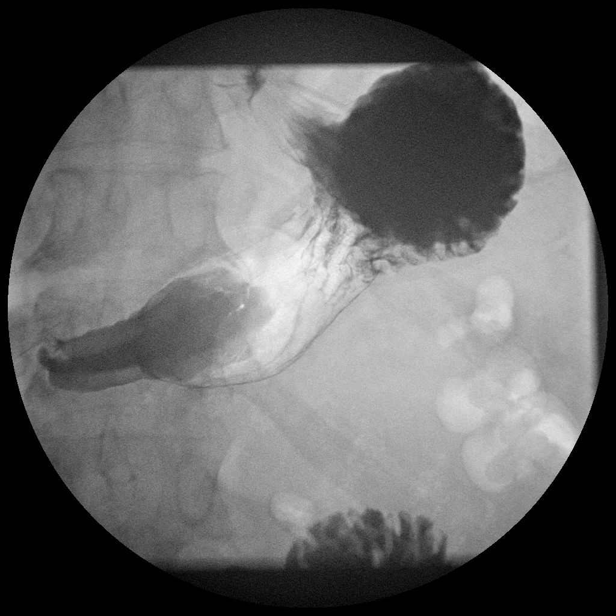

[Series 6: run · 1 of 1 slices shown (5 of 12)]
[im 1/1]
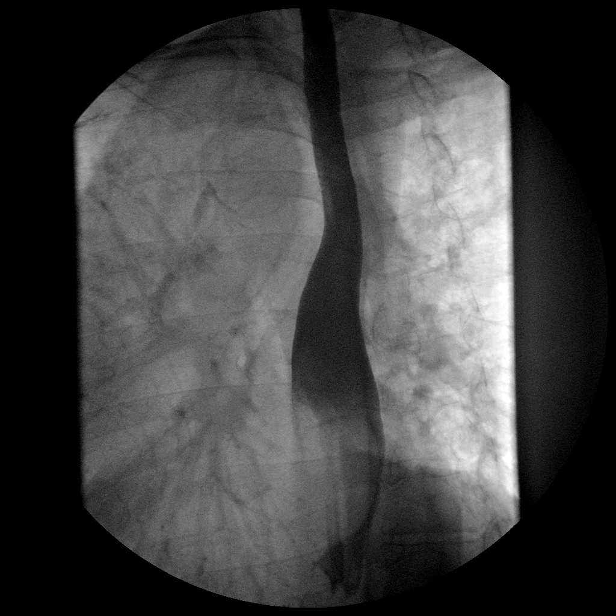

[Series 7: run · 1 of 1 slices shown (6 of 12)]
[im 1/1]
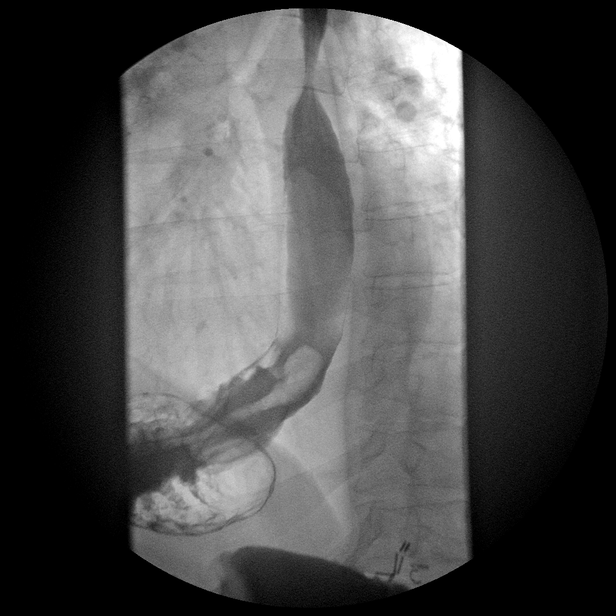

[Series 8: run · 1 of 1 slices shown (7 of 12)]
[im 1/1]
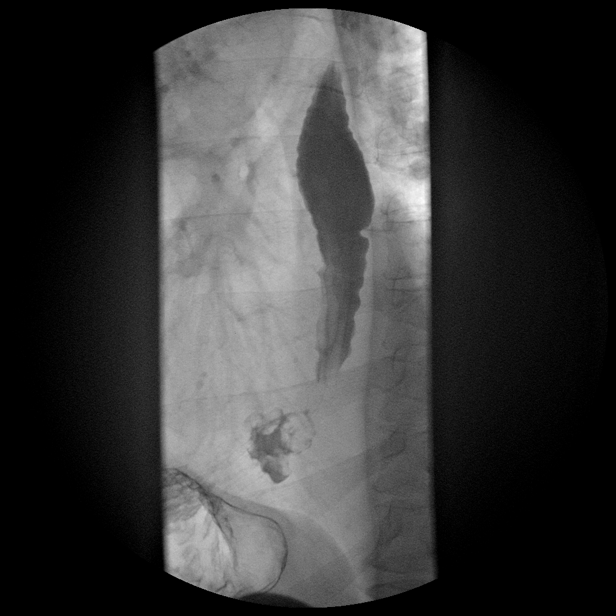

[Series 9: run · 1 of 1 slices shown (8 of 12)]
[im 1/1]
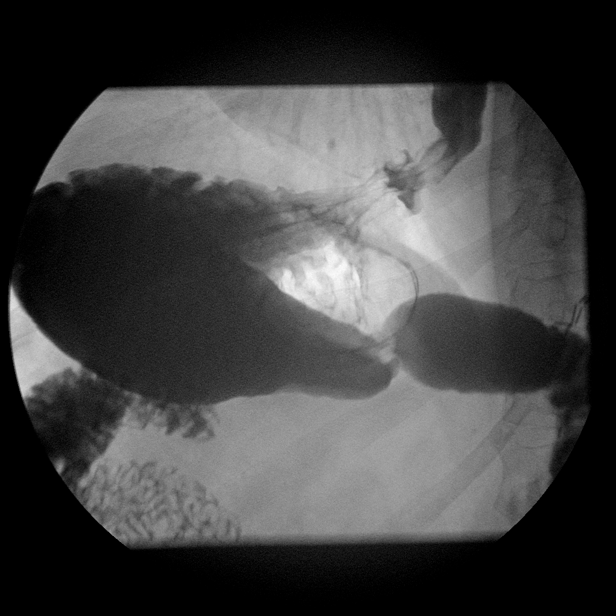

[Series 10: run · 1 of 1 slices shown (9 of 12)]
[im 1/1]
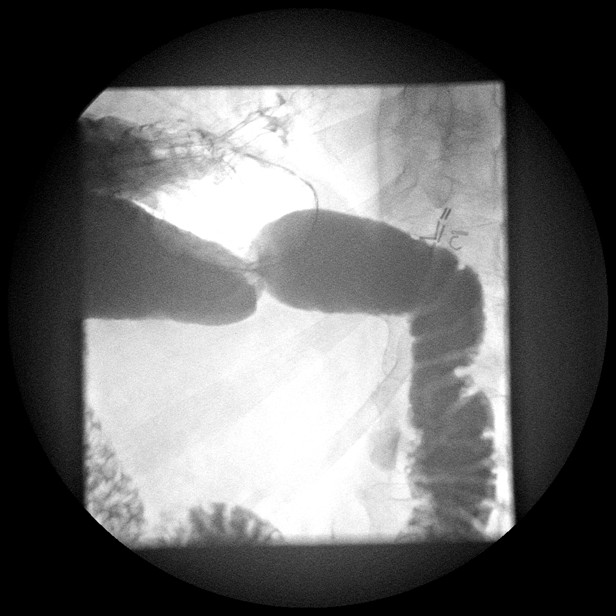

[Series 11: run · 1 of 1 slices shown (10 of 12)]
[im 1/1]
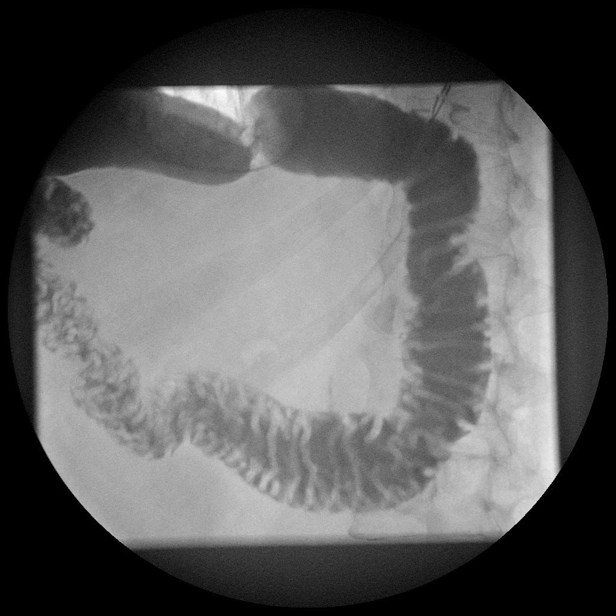

[Series 13: run · 1 of 1 slices shown (11 of 12)]
[im 1/1]
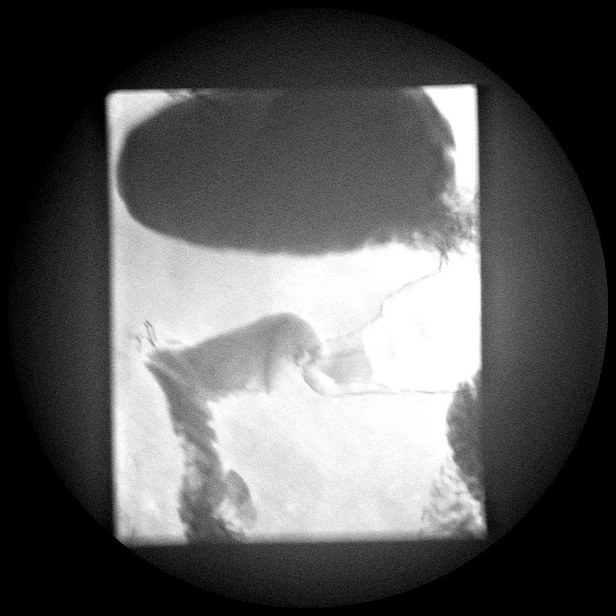

[Series 14: run · 1 of 1 slices shown (12 of 12)]
[im 1/1]
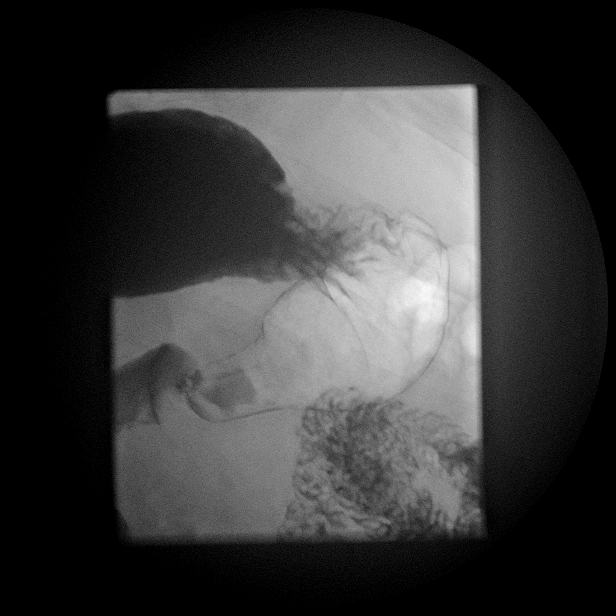

[Series 1001: view not recorded · 0.20mm/px · 1 of 1 slices shown (1 of 2)]
[im 1/1]
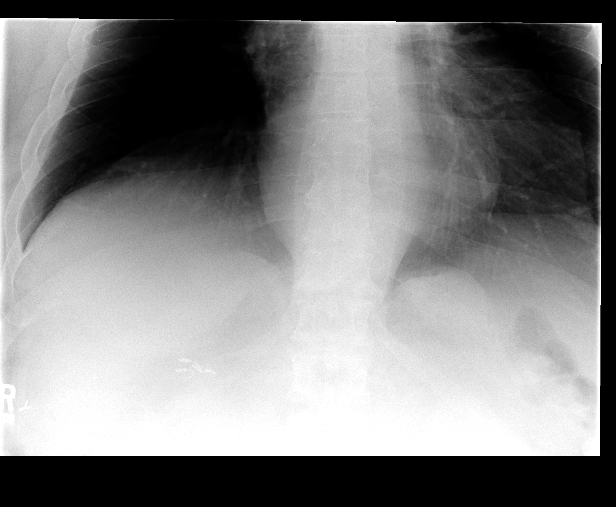

[Series 1002: view not recorded · 0.20mm/px · 1 of 1 slices shown (2 of 2)]
[im 1/1]
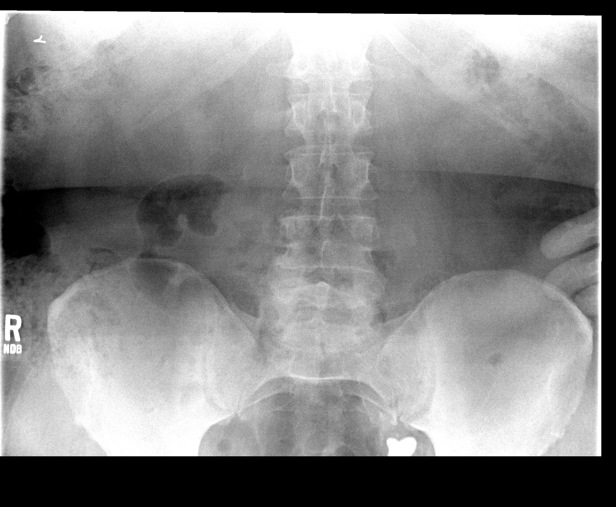

[14 of 16 positions shown; findings below may reference images not displayed]

FINDINGS: Scout view of the abdomen pelvis demonstrates
cholecystectomy clips and a nonobstructive bowel gas pattern.
Smoothly marginated radiopaque densities projecting over the left
aspect of the pelvis, one which is not completely image, are noted.
These are of uncertain etiology but could reflect large calcified
phleboliths, or potentially foreign bodies external to the patient.

 The primary peristaltic wave of the esophagus is normal.  A few
tertiary contractions were noted. The esophagus demonstrates normal
distensibility and contour.  No esophageal mass is identified.
There is a small sliding type hiatal hernia. Otherwise, the stomach
appears within normal limits.  The duodenal bulb and duodenal C-
loop appear normal.

One episode of asymptomatic gastroesophageal reflux occurred when
the patient was rolling from a prone to supine position on the
table. Reflux occurred to the lower third of the esophagus.  No
reflux was elicited with patient coughing.
IMPRESSION: 1.  Small sliding type hiatal hernia.
2.  One episode of gastroesophageal reflux was visualized.

## 2013-05-01 ENCOUNTER — Encounter (INDEPENDENT_AMBULATORY_CARE_PROVIDER_SITE_OTHER): Payer: Self-pay

## 2013-05-01 ENCOUNTER — Telehealth (INDEPENDENT_AMBULATORY_CARE_PROVIDER_SITE_OTHER): Payer: Self-pay

## 2013-05-01 ENCOUNTER — Ambulatory Visit (INDEPENDENT_AMBULATORY_CARE_PROVIDER_SITE_OTHER): Payer: BC Managed Care – PPO

## 2013-05-01 VITALS — BP 118/82 | HR 71 | Temp 97.4°F | Resp 16 | Ht 73.0 in | Wt 216.8 lb

## 2013-05-01 DIAGNOSIS — Z4802 Encounter for removal of sutures: Secondary | ICD-10-CM

## 2013-05-01 NOTE — Progress Notes (Signed)
Patient comes into office today to have remaining sutures removed from scalp excision.  Dr. Biagio Quint assess patient and proceeded to remove sutures.  Patient tolerated well.

## 2013-05-01 NOTE — Telephone Encounter (Signed)
Patient wife called in stating husband has been complaining of soreness around incision site. She looked at it and states it looks like some sutures were left in. There are "black strings" at incision site that look like sutures. Patient came in on 8/12 to have them removed but thinks some were left in. Appt made this afternoon to have nurse only visit to have someone look at it. No one went over path with them. I let her know it was benign.

## 2013-05-02 DIAGNOSIS — K222 Esophageal obstruction: Secondary | ICD-10-CM | POA: Insufficient documentation

## 2013-07-29 ENCOUNTER — Ambulatory Visit: Payer: BC Managed Care – PPO | Admitting: Interventional Cardiology

## 2013-08-06 ENCOUNTER — Ambulatory Visit (INDEPENDENT_AMBULATORY_CARE_PROVIDER_SITE_OTHER): Payer: BC Managed Care – PPO | Admitting: Interventional Cardiology

## 2013-08-06 ENCOUNTER — Encounter: Payer: Self-pay | Admitting: Interventional Cardiology

## 2013-08-06 VITALS — BP 108/68 | HR 52 | Ht 73.0 in | Wt 211.0 lb

## 2013-08-06 DIAGNOSIS — I4891 Unspecified atrial fibrillation: Secondary | ICD-10-CM

## 2013-08-06 DIAGNOSIS — G459 Transient cerebral ischemic attack, unspecified: Secondary | ICD-10-CM

## 2013-08-06 DIAGNOSIS — I10 Essential (primary) hypertension: Secondary | ICD-10-CM

## 2013-08-06 DIAGNOSIS — E785 Hyperlipidemia, unspecified: Secondary | ICD-10-CM

## 2013-08-06 DIAGNOSIS — Z7901 Long term (current) use of anticoagulants: Secondary | ICD-10-CM | POA: Insufficient documentation

## 2013-08-06 NOTE — Patient Instructions (Signed)
Stop Metoprolol  Continue taking your other medications as prescribed  Your physician wants you to follow-up in: 6 months You will receive a reminder letter in the mail two months in advance. If you don't receive a letter, please call our office to schedule the follow-up appointment.

## 2013-08-06 NOTE — Progress Notes (Signed)
Patient ID: Lawrence Dolly., male   DOB: [birth year], 62 y.o.   MRN: [id number]    [address], [address] [place], [place]  [address] Phone: [phone] Fax:  [phone]  Date:  08/06/2013   ID:  Lawrence Dolly., DOB 22-May-1951, MRN [id number]  PCP:  Lillia Mountain, MD   ASSESSMENT:  1. Atrial fib with slow ventricular response. The slow ventricular response alarms the anesthesia caretakers when the patient has undergo conscious sedation for esophageal dilatation 2. Chronic anticoagulation therapy, without complications. 3. History of hypertension but with relatively low blood pressures after dramatic weight loss. 4. Hyperlipidemia  PLAN:  1. Discontinue beta blocker therapy due to relatively slow heart rate and low blood pressure 2. Clinical followup in 6 months   SUBJECTIVE: Lawrence Grizzell. is a 62 y.o. male doing well except having difficulty swallowing. Still getting dilatations performed. Blood pressure and heart rate tends to be low when he goes to have esophageal dilatation. Appetite is good but caloric intake is restricted by esophageal obstruction .   Wt Readings from Last 3 Encounters:  08/06/13 211 lb (95.709 kg)  05/01/13 216 lb 12.8 oz (98.34 kg)  04/01/13 216 lb (97.977 kg)     Past Medical History  Diagnosis Date  . Diabetes mellitus   . Hyperlipidemia     hx of, "weight loss took care of it"  . Anxiety   . Dysrhythmia     a.fib  . Cancer 0ct. 22 2013    esophageal   . GERD (gastroesophageal reflux disease)     occasional before j tube  . H/O hiatal hernia     h/o  . Hypertension     weight loss lowered it    Current Outpatient Prescriptions  Medication Sig Dispense Refill  . acetaminophen (TYLENOL) 500 MG tablet 500 mg by Per J Tube route every 6 (six) hours as needed for pain.      . cyclobenzaprine (FLEXERIL) 5 MG tablet 5 mg by Per J Tube route 3 (three) times daily as needed for muscle spasms.      .  digoxin (LANOXIN) 0.25 MG tablet 0.25 mg by Per J Tube route every morning.       . feeding supplement (GLUCERNA SHAKE) LIQD Take 1,422 mLs by mouth at bedtime. Start at 6 pm and stop at 6 am      . FLUoxetine (PROZAC) 20 MG tablet 20 mg by Per J Tube route every morning.       . hyoscyamine (LEVSIN SL) 0.125 MG SL tablet Place 0.125 mg under the tongue every 6 (six) hours as needed.       Marland Kitchen LANTUS 100 UNIT/ML injection Inject 22 Units into the skin every morning.       Marland Kitchen LORazepam (ATIVAN) 1 MG tablet Place 1 mg under the tongue every 8 (eight) hours as needed for anxiety. For anxiety      . metoprolol tartrate (LOPRESSOR) 25 MG tablet 12.5 mg by Per J Tube route 2 (two) times daily.       Marland Kitchen omeprazole (PRILOSEC) 10 MG capsule Inject 20 Units into the skin nightly.      . Rivaroxaban (XARELTO) 20 MG TABS 20 mg by Per J Tube route daily.       No current facility-administered medications for this visit.    Allergies:    Allergies  Allergen Reactions  . Codeine Anxiety    Social History:  The patient  reports that he has never smoked. He has never used smokeless tobacco. He reports that he drinks alcohol. He reports that he does not use illicit drugs.   ROS:  Please see the history of present illness.   Dysphagia improves after each dilatation   All other systems reviewed and negative.   OBJECTIVE: VS:  BP 108/68  Pulse 52  Ht 6\' 1"  (1.854 m)  Wt 211 lb (95.709 kg)  BMI 27.84 kg/m2 Well nourished, well developed, in no acute distress, appearing healthier than he has in quite some time HEENT: normal Neck: JVD flat. Carotid bruit absent  Cardiac:  normal S1, S2; irregularly irregular RR; no murmur Lungs:  clear to auscultation bilaterally, no wheezing, rhonchi or rales Abd: soft, nontender, no hepatomegaly Ext: Edema absent. Pulses 2+ Skin: warm and dry Neuro:  CNs 2-12 intact, no focal abnormalities noted  EKG:  Atrial fibrillation with slow ventricular response, 52  bpm.    Signed, Darci Needle III, MD 08/06/2013 12:14 PM

## 2013-10-21 ENCOUNTER — Ambulatory Visit (INDEPENDENT_AMBULATORY_CARE_PROVIDER_SITE_OTHER): Payer: BC Managed Care – PPO | Admitting: *Deleted

## 2013-10-21 DIAGNOSIS — I10 Essential (primary) hypertension: Secondary | ICD-10-CM

## 2013-10-21 DIAGNOSIS — Z7901 Long term (current) use of anticoagulants: Secondary | ICD-10-CM

## 2013-10-21 LAB — HEMOGLOBIN AND HEMATOCRIT, BLOOD
HCT: 42.4 % (ref 39.0–52.0)
HEMOGLOBIN: 14.1 g/dL (ref 13.0–17.0)

## 2013-10-21 LAB — CREATININE, SERUM: Creatinine, Ser: 0.8 mg/dL (ref 0.4–1.5)

## 2013-10-23 ENCOUNTER — Telehealth: Payer: Self-pay

## 2013-10-23 NOTE — Telephone Encounter (Signed)
lmom.  Normal labs  

## 2013-10-23 NOTE — Telephone Encounter (Signed)
Message copied by Lamar Laundry on Wed Oct 23, 2013  4:29 PM ------      Message from: Daneen Schick      Created: Mon Oct 21, 2013  2:04 PM       Normal labs ------

## 2014-02-03 ENCOUNTER — Encounter: Payer: Self-pay | Admitting: Interventional Cardiology

## 2014-02-03 ENCOUNTER — Ambulatory Visit (INDEPENDENT_AMBULATORY_CARE_PROVIDER_SITE_OTHER): Payer: BC Managed Care – PPO | Admitting: Interventional Cardiology

## 2014-02-03 VITALS — BP 138/90 | HR 64 | Ht 73.0 in | Wt 216.8 lb

## 2014-02-03 DIAGNOSIS — I10 Essential (primary) hypertension: Secondary | ICD-10-CM

## 2014-02-03 DIAGNOSIS — I4891 Unspecified atrial fibrillation: Secondary | ICD-10-CM

## 2014-02-03 DIAGNOSIS — R002 Palpitations: Secondary | ICD-10-CM

## 2014-02-03 DIAGNOSIS — Z7901 Long term (current) use of anticoagulants: Secondary | ICD-10-CM

## 2014-02-03 NOTE — Progress Notes (Signed)
Patient ID: [name], male   DOB: 16-Mar-1951, 63 y.o.   MRN: [id number]    [address], [address] [place], [place]  [address] Phone: [phone] Fax:  [phone]  Date:  02/03/2014   ID:  [name], DOB [birth year], MRN [id number]  PCP:  Irven Shelling, MD   ASSESSMENT:  1. Nocturnal palpitations, chest tightness, and dyspnea, intermittent 2. Chronic atrial fibrillation 3. Chronic anticoagulation 4. Hypertension  PLAN:  1. 2-D Doppler echocardiogram to assess LV function and rule out the possibility of rate induced LV systolic dysfunction 2. Two-week cardiac monitor to identify the source/etiology of palpitations 3. Otherwise will followup in one year   SUBJECTIVE: Lawrence [name]. is a 63 y.o. male who complains of 6-8 week history of nocturnal palpitations/pounding when he is recumbent. There is 80 sensation of tightness in the chest and some mild dyspnea. This can go on up to 60 minutes. It does not happen every night. It does not happen with exertion. He denies lower extremity edema. Medication regimen as being complied with. He denies syncope.   Wt Readings from Last 3 Encounters:  02/03/14 216 lb 12.8 oz (98.34 kg)  08/06/13 211 lb (95.709 kg)  05/01/13 216 lb 12.8 oz (98.34 kg)     Past Medical History  Diagnosis Date  . Diabetes mellitus   . Hyperlipidemia     hx of, "weight loss took care of it"  . Anxiety   . Dysrhythmia     a.fib  . Cancer 0ct. 22 2013    esophageal   . GERD (gastroesophageal reflux disease)     occasional before j tube  . H/O hiatal hernia     h/o  . Hypertension     weight loss lowered it    Current Outpatient Prescriptions  Medication Sig Dispense Refill  . acetaminophen (TYLENOL) 500 MG tablet 500 mg by Per J Tube route every 6 (six) hours as needed for pain.      Marland Kitchen digoxin (LANOXIN) 0.25 MG tablet 0.25 mg by Per J Tube route every morning.       Marland Kitchen FLUoxetine (PROZAC) 20 MG tablet 20  mg by Per J Tube route every morning.       Marland Kitchen LANTUS 100 UNIT/ML injection Inject 22 Units into the skin every morning.       Marland Kitchen LORazepam (ATIVAN) 1 MG tablet Place 1 mg under the tongue every 8 (eight) hours as needed for anxiety. For anxiety      . ranitidine (ZANTAC) 150 MG tablet Take 150 mg by mouth at bedtime.       . Rivaroxaban (XARELTO) 20 MG TABS 20 mg by Per J Tube route daily.       No current facility-administered medications for this visit.    Allergies:    No Active Allergies  Social History:  The patient  reports that he has never smoked. He has never used smokeless tobacco. He reports that he drinks alcohol. He reports that he does not use illicit drugs.   ROS:  Please see the history of present illness.   No bleeding or neurological complications.   All other systems reviewed and negative.   OBJECTIVE: VS:  BP 138/90  Pulse 64  Ht 6\' 1"  (1.854 m)  Wt 216 lb 12.8 oz (98.34 kg)  BMI 28.61 kg/m2 Well nourished, well developed, in no acute distress, healthy-appearing HEENT: normal Neck: JVD flat. Carotid bruit absent. There is a  long scar from the mandible to the clavicle on the left cervical anterior region.  Cardiac:  normal S1, S2; RRR; no murmur Lungs:  clear to auscultation bilaterally, no wheezing, rhonchi or rales Abd: soft, nontender, no hepatomegaly Ext: Edema absent. Pulses absent left radial pulse Skin: warm and dry Neuro:  CNs 2-12 intact, no focal abnormalities noted  EKG:  Atrial fibrillation with controlled ventricular rate and otherwise normal. No change compared to prior       Signed, Illene Labrador III, MD 02/03/2014 8:30 AM

## 2014-02-03 NOTE — Patient Instructions (Addendum)
Your physician recommends that you continue on your current medications as directed. Please refer to the Current Medication list given to you today.  Your physician has recommended that you wear an event monitor. Event monitors are medical devices that record the heart's electrical activity. Doctors most often [place] these monitors to diagnose arrhythmias. Arrhythmias are problems with the speed or rhythm of the heartbeat. The monitor is a small, portable device. You can wear one while you do your normal daily activities. This is usually used to diagnose what is causing palpitations/syncope (passing out).("Z" Monitor)  Your physician has requested that you have an echocardiogram. Echocardiography is a painless test that uses sound waves to create images of your heart. It provides your doctor with information about the size and shape of your heart and how well your heart's chambers and valves are working. This procedure takes approximately one hour. There are no restrictions for this procedure.    Your physician wants you to follow-up in: 1 year You will receive a reminder letter in the mail two months in advance. If you don't receive a letter, please call our office to schedule the follow-up appointment.

## 2014-02-04 ENCOUNTER — Encounter: Payer: Self-pay | Admitting: *Deleted

## 2014-02-04 ENCOUNTER — Encounter (INDEPENDENT_AMBULATORY_CARE_PROVIDER_SITE_OTHER): Payer: BC Managed Care – PPO

## 2014-02-04 ENCOUNTER — Encounter: Payer: Self-pay | Admitting: Interventional Cardiology

## 2014-02-04 DIAGNOSIS — I4891 Unspecified atrial fibrillation: Secondary | ICD-10-CM

## 2014-02-04 NOTE — Progress Notes (Signed)
Patient ID: [name], male   DOB: 03-Oct-1950, 63 y.o.   MRN: [id number] Zio Patch 14 day cardiac event recorder applied to patient.

## 2014-02-12 ENCOUNTER — Ambulatory Visit (HOSPITAL_COMMUNITY)
Admission: RE | Admit: 2014-02-12 | Discharge: 2014-02-12 | Disposition: A | Discharge status: Home / Self Care | Payer: BC Managed Care – PPO | Source: Ambulatory Visit | Attending: Cardiovascular Disease | Admitting: Cardiovascular Disease

## 2014-02-12 DIAGNOSIS — I517 Cardiomegaly: Secondary | ICD-10-CM

## 2014-02-12 DIAGNOSIS — R079 Chest pain, unspecified: Secondary | ICD-10-CM | POA: Insufficient documentation

## 2014-02-12 DIAGNOSIS — I4891 Unspecified atrial fibrillation: Secondary | ICD-10-CM

## 2014-02-12 NOTE — Progress Notes (Signed)
2D Echo Performed 02/12/2014    Marygrace Drought, RCS

## 2014-02-13 ENCOUNTER — Telehealth: Payer: Self-pay

## 2014-02-13 NOTE — Telephone Encounter (Signed)
called to give pt echo results.lmtcb 

## 2014-02-13 NOTE — Telephone Encounter (Signed)
returned pt call.pt given echo results.Echo is structurally okay.pt verbalized understanding.

## 2014-02-13 NOTE — Telephone Encounter (Signed)
Message copied by Lamar Laundry on Thu Feb 13, 2014  1:08 PM ------      Message from: Daneen Schick      Created: Wed Feb 12, 2014  7:43 PM       Echo is structurally okay ------

## 2014-02-13 NOTE — Telephone Encounter (Signed)
Patient is returning your call, please call back to  [phone]

## 2014-02-13 NOTE — Telephone Encounter (Signed)
Message copied by Lamar Laundry on Thu Feb 13, 2014 12:29 PM ------      Message from: Daneen Schick      Created: Wed Feb 12, 2014  7:43 PM       Echo is structurally okay ------

## 2014-02-27 ENCOUNTER — Telehealth: Payer: Self-pay

## 2014-02-27 NOTE — Telephone Encounter (Signed)
pt aware of "z"monitor results.100% afib.tachy brady(nocturnal pauses) tachy as high as 190bpm during activity.pt adv he may need a pacemaker.adv pt that we need to set up a consult with one of the EP cardiologist.pt verbalized understanding. pt is gong on vacation and will not be back in town until after 03/10/14. Pt will call back  to schedule a EP consult.

## 2014-03-24 ENCOUNTER — Institutional Professional Consult (permissible substitution): Payer: BC Managed Care – PPO | Admitting: Internal Medicine

## 2014-05-26 ENCOUNTER — Other Ambulatory Visit: Payer: Self-pay

## 2014-05-26 MED ORDER — RIVAROXABAN 20 MG PO TABS: 20.0000 mg | ORAL_TABLET | Freq: Every day | ORAL | Status: DC

## 2014-06-24 ENCOUNTER — Encounter: Payer: Self-pay | Admitting: Interventional Cardiology

## 2015-02-28 DIAGNOSIS — I10 Essential (primary) hypertension: Secondary | ICD-10-CM | POA: Insufficient documentation

## 2015-07-08 ENCOUNTER — Other Ambulatory Visit: Payer: Self-pay | Admitting: Gastroenterology

## 2015-07-09 ENCOUNTER — Encounter (HOSPITAL_COMMUNITY): Payer: Self-pay | Admitting: *Deleted

## 2015-07-13 ENCOUNTER — Ambulatory Visit (HOSPITAL_COMMUNITY): Payer: BLUE CROSS/BLUE SHIELD | Admitting: Certified Registered Nurse Anesthetist

## 2015-07-13 ENCOUNTER — Encounter (HOSPITAL_COMMUNITY): Payer: Self-pay | Admitting: Certified Registered Nurse Anesthetist

## 2015-07-13 ENCOUNTER — Encounter (HOSPITAL_COMMUNITY)
Admission: RE | Disposition: A | Discharge status: Home / Self Care | Payer: Self-pay | Source: Ambulatory Visit | Attending: Gastroenterology

## 2015-07-13 ENCOUNTER — Ambulatory Visit (HOSPITAL_COMMUNITY)
Admission: RE | Admit: 2015-07-13 | Discharge: 2015-07-13 | Disposition: A | Discharge status: Home / Self Care | Payer: BLUE CROSS/BLUE SHIELD | Source: Ambulatory Visit | Attending: Gastroenterology | Admitting: Gastroenterology

## 2015-07-13 DIAGNOSIS — I10 Essential (primary) hypertension: Secondary | ICD-10-CM | POA: Insufficient documentation

## 2015-07-13 DIAGNOSIS — D126 Benign neoplasm of colon, unspecified: Secondary | ICD-10-CM | POA: Diagnosis not present

## 2015-07-13 DIAGNOSIS — K589 Irritable bowel syndrome without diarrhea: Secondary | ICD-10-CM | POA: Insufficient documentation

## 2015-07-13 DIAGNOSIS — E7800 Pure hypercholesterolemia, unspecified: Secondary | ICD-10-CM | POA: Diagnosis not present

## 2015-07-13 DIAGNOSIS — H3530 Unspecified macular degeneration: Secondary | ICD-10-CM | POA: Diagnosis not present

## 2015-07-13 DIAGNOSIS — E11319 Type 2 diabetes mellitus with unspecified diabetic retinopathy without macular edema: Secondary | ICD-10-CM | POA: Insufficient documentation

## 2015-07-13 DIAGNOSIS — Z950 Presence of cardiac pacemaker: Secondary | ICD-10-CM | POA: Diagnosis not present

## 2015-07-13 DIAGNOSIS — Z7984 Long term (current) use of oral hypoglycemic drugs: Secondary | ICD-10-CM | POA: Diagnosis not present

## 2015-07-13 DIAGNOSIS — N400 Enlarged prostate without lower urinary tract symptoms: Secondary | ICD-10-CM | POA: Insufficient documentation

## 2015-07-13 DIAGNOSIS — Z8501 Personal history of malignant neoplasm of esophagus: Secondary | ICD-10-CM | POA: Diagnosis not present

## 2015-07-13 DIAGNOSIS — I4891 Unspecified atrial fibrillation: Secondary | ICD-10-CM | POA: Insufficient documentation

## 2015-07-13 DIAGNOSIS — Z86010 Personal history of colonic polyps: Secondary | ICD-10-CM | POA: Insufficient documentation

## 2015-07-13 DIAGNOSIS — Z7901 Long term (current) use of anticoagulants: Secondary | ICD-10-CM | POA: Insufficient documentation

## 2015-07-13 DIAGNOSIS — E1140 Type 2 diabetes mellitus with diabetic neuropathy, unspecified: Secondary | ICD-10-CM | POA: Diagnosis not present

## 2015-07-13 DIAGNOSIS — Z1211 Encounter for screening for malignant neoplasm of colon: Secondary | ICD-10-CM | POA: Insufficient documentation

## 2015-07-13 HISTORY — PX: COLONOSCOPY WITH PROPOFOL: SHX5780

## 2015-07-13 HISTORY — DX: Presence of cardiac pacemaker: Z95.0

## 2015-07-13 LAB — GLUCOSE, CAPILLARY: Glucose-Capillary: 98 mg/dL (ref 65–99)

## 2015-07-13 SURGERY — COLONOSCOPY WITH PROPOFOL
Anesthesia: Monitor Anesthesia Care

## 2015-07-13 MED ORDER — LIDOCAINE HCL (CARDIAC) 20 MG/ML IV SOLN
INTRAVENOUS | Status: DC | PRN
  Administered 2015-07-13: 50 mg via INTRAVENOUS

## 2015-07-13 MED ORDER — PROPOFOL 500 MG/50ML IV EMUL
INTRAVENOUS | Status: DC | PRN
  Administered 2015-07-13: 125 ug/kg/min via INTRAVENOUS
  Administered 2015-07-13: 100 ug/kg/min via INTRAVENOUS

## 2015-07-13 MED ORDER — SODIUM CHLORIDE 0.9 % IV SOLN: INTRAVENOUS | Status: DC

## 2015-07-13 MED ORDER — LACTATED RINGERS IV SOLN
INTRAVENOUS | Status: DC
  Administered 2015-07-13: 10:00:00 via INTRAVENOUS

## 2015-07-13 MED ORDER — PROPOFOL 10 MG/ML IV BOLUS
INTRAVENOUS | Status: AC
  Filled 2015-07-13: qty 20

## 2015-07-13 MED ORDER — PROPOFOL 500 MG/50ML IV EMUL
INTRAVENOUS | Status: DC | PRN
  Administered 2015-07-13 (×3): 30 mg via INTRAVENOUS

## 2015-07-13 SURGICAL SUPPLY — 22 items

## 2015-07-13 NOTE — Discharge Instructions (Signed)

## 2015-07-13 NOTE — Transfer of Care (Signed)
Immediate Anesthesia Transfer of Care Note  Patient: Lawrence [name].  Procedure(s) Performed: Procedure(s): COLONOSCOPY WITH PROPOFOL (N/A)  Patient Location: PACU  Anesthesia Type:MAC  Level of Consciousness: awake, alert  and oriented  Airway & Oxygen Therapy: Patient Spontanous Breathing and Patient connected to face mask oxygen  Post-op Assessment: Report given to RN and Post -op Vital signs reviewed and stable  Post vital signs: Reviewed and stable  Last Vitals:  Filed Vitals:   07/13/15 0950  BP: 155/103  Pulse: 107  Temp: 36.6 C  Resp: 18    Complications: No apparent anesthesia complications

## 2015-07-13 NOTE — H&P (Signed)
  Procedure: Surveillance colonoscopy. History of adenomatous colon polyps removed colonoscopically in the past. Cardiac ablation to treat atrial fibrillation. Chronic anticoagulation with xarelto  History: The patient is a 64 year old male born [birth year]. He is scheduled to undergo a surveillance colonoscopy today. He stopped taking xarelto 4 days ago.  Past medical history:2 diabetes mellitus complicated by diabetic retinopathy and diabetic neuropathy. Hypertension. Benign prostatic hypertrophy. Depression. Panic attacks. Hypercholesterolemia. Irritable bowel syndrome. Kidney stones. Macular degeneration. Cardiac ablation to treat atrial fibrillation. Cardiac pacemaker placement. Tonsillectomy. Bilateral inguinal herniorrhaphies. Cholecystectomy. Knee surgery. Esophageal adenocarcinoma surgery.  Exam: The patient is alert and lying comfortably on the endoscopy stretcher. Abdomen is soft and nontender to palpation. Lungs are clear to auscultation. Cardiac exam reveals a regular rhythm. Cardiac pacemaker in place.  Plan: Proceed with surveillance colonoscopy

## 2015-07-13 NOTE — Op Note (Signed)
Procedure: Surveillance colonoscopy. Adenomatous colon polyps removed colonoscopically in the past  Endoscopist: Earle Gell  Premedication: Propofol administered by anesthesia  Procedure: The patient was placed in the left lateral decubitus position. Anal inspection and digital rectal exam were normal. Pentax pediatric colonoscope was introduced into the rectum and advanced to the cecum. A normal-appearing ileocecal valve and appendiceal orifice were identified. Colonic preparation for the exam today was good. Withdrawal time was 21 minutes  Rectum. Normal. Retroflexed view of the distal rectum was normal  Sigmoid colon. From the mid sigmoid colon, a 3 mm sessile polyp was removed with the cold biopsy forceps  Descending colon. Normal  Splenic flexure. Normal  Transverse colon. Normal  Hepatic flexure. Normal  Ascending colon. From the distal ascending colon, a 5 mm sessile polyp was removed with the cold snare  Cecum and ileocecal valve. A 7 mm sessile polyp was removed from the cecum with the hot snare; an Endo Clip was applied to the polypectomy site to prevent bleeding  Assessment: A diminutive polyp was removed from the sigmoid colon, a 5 mm sessile polyp was removed from the ascending colon, and a 7 mm sessile polyp was removed from the cecum. An Endo Clip was applied to the cecal polypectomy site to prevent bleeding.

## 2015-07-13 NOTE — Anesthesia Postprocedure Evaluation (Signed)
Anesthesia Post Note  Patient: Lawrence [name].  Procedure(s) Performed: Procedure(s) (LRB): COLONOSCOPY WITH PROPOFOL (N/A)  Anesthesia type: MAC  Patient location: PACU  Post pain: Pain level controlled  Post assessment: Patient's Cardiovascular Status Stable  Last Vitals:  Filed Vitals:   07/13/15 1120  BP: 136/100  Pulse: 74  Temp:   Resp: 21    Post vital signs: Reviewed and stable  Level of consciousness: sedated  Complications: No apparent anesthesia complications

## 2015-07-13 NOTE — Anesthesia Preprocedure Evaluation (Signed)
Anesthesia Evaluation  Patient identified by MRN, date of birth, ID band Patient awake    Reviewed: Allergy & Precautions, NPO status , Patient's Chart, lab work & pertinent test results  Airway Mallampati: I  TM Distance: >3 FB Neck ROM: Full    Dental   Pulmonary    Pulmonary exam normal        Cardiovascular hypertension, Pt. on medications Normal cardiovascular exam+ dysrhythmias + pacemaker      Neuro/Psych    GI/Hepatic   Endo/Other  diabetes, Type 2, Oral Hypoglycemic Agents  Renal/GU      Musculoskeletal   Abdominal   Peds  Hematology   Anesthesia Other Findings   Reproductive/Obstetrics                             Anesthesia Physical Anesthesia Plan  ASA: III  Anesthesia Plan: MAC   Post-op Pain Management:    Induction: Intravenous  Airway Management Planned: Simple Face Mask  Additional Equipment:   Intra-op Plan:   Post-operative Plan:   Informed Consent: I have reviewed the patients History and Physical, chart, labs and discussed the procedure including the risks, benefits and alternatives for the proposed anesthesia with the patient or authorized representative who has indicated his/her understanding and acceptance.     Plan Discussed with: CRNA and Surgeon  Anesthesia Plan Comments:         Anesthesia Quick Evaluation

## 2015-07-14 ENCOUNTER — Encounter (HOSPITAL_COMMUNITY): Payer: Self-pay | Admitting: Gastroenterology

## 2015-12-09 DIAGNOSIS — Z5181 Encounter for therapeutic drug level monitoring: Secondary | ICD-10-CM | POA: Insufficient documentation

## 2015-12-09 DIAGNOSIS — Z950 Presence of cardiac pacemaker: Secondary | ICD-10-CM | POA: Insufficient documentation

## 2015-12-22 ENCOUNTER — Ambulatory Visit
Admission: RE | Admit: 2015-12-22 | Discharge: 2015-12-22 | Disposition: A | Discharge status: Home / Self Care | Payer: BLUE CROSS/BLUE SHIELD | Source: Ambulatory Visit | Attending: Nurse Practitioner | Admitting: Nurse Practitioner

## 2015-12-22 ENCOUNTER — Other Ambulatory Visit: Payer: Self-pay | Admitting: Nurse Practitioner

## 2015-12-22 DIAGNOSIS — M542 Cervicalgia: Secondary | ICD-10-CM

## 2016-01-25 ENCOUNTER — Other Ambulatory Visit: Payer: Self-pay | Admitting: Internal Medicine

## 2016-01-25 ENCOUNTER — Other Ambulatory Visit: Payer: BLUE CROSS/BLUE SHIELD

## 2016-01-25 DIAGNOSIS — I779 Disorder of arteries and arterioles, unspecified: Secondary | ICD-10-CM

## 2016-01-28 ENCOUNTER — Other Ambulatory Visit: Payer: BLUE CROSS/BLUE SHIELD

## 2016-02-05 ENCOUNTER — Ambulatory Visit
Admission: RE | Admit: 2016-02-05 | Discharge: 2016-02-05 | Disposition: A | Discharge status: Home / Self Care | Payer: BLUE CROSS/BLUE SHIELD | Source: Ambulatory Visit | Attending: Internal Medicine | Admitting: Internal Medicine

## 2016-02-05 DIAGNOSIS — I779 Disorder of arteries and arterioles, unspecified: Secondary | ICD-10-CM

## 2016-02-25 ENCOUNTER — Other Ambulatory Visit (HOSPITAL_COMMUNITY): Payer: Self-pay | Admitting: Orthopedic Surgery

## 2016-02-25 DIAGNOSIS — M5412 Radiculopathy, cervical region: Secondary | ICD-10-CM

## 2016-03-07 ENCOUNTER — Ambulatory Visit (HOSPITAL_COMMUNITY): Payer: BLUE CROSS/BLUE SHIELD

## 2016-03-14 ENCOUNTER — Ambulatory Visit (HOSPITAL_COMMUNITY): Admission: RE | Admit: 2016-03-14 | Payer: BLUE CROSS/BLUE SHIELD | Source: Ambulatory Visit

## 2016-03-14 ENCOUNTER — Ambulatory Visit (HOSPITAL_COMMUNITY)
Admission: RE | Admit: 2016-03-14 | Discharge: 2016-03-14 | Disposition: A | Discharge status: Home / Self Care | Payer: BLUE CROSS/BLUE SHIELD | Source: Ambulatory Visit | Attending: Orthopedic Surgery | Admitting: Orthopedic Surgery

## 2016-03-14 DIAGNOSIS — M5412 Radiculopathy, cervical region: Secondary | ICD-10-CM

## 2016-03-14 NOTE — Progress Notes (Signed)
Pt fearful and nervous of MRI scanner, offer assurance and call bell.

## 2016-03-17 ENCOUNTER — Ambulatory Visit (HOSPITAL_COMMUNITY)
Admission: RE | Admit: 2016-03-17 | Discharge: 2016-03-17 | Disposition: A | Discharge status: Home / Self Care | Payer: BLUE CROSS/BLUE SHIELD | Source: Ambulatory Visit | Attending: Orthopedic Surgery | Admitting: Orthopedic Surgery

## 2016-03-17 DIAGNOSIS — M5412 Radiculopathy, cervical region: Secondary | ICD-10-CM | POA: Insufficient documentation

## 2016-03-17 DIAGNOSIS — M479 Spondylosis, unspecified: Secondary | ICD-10-CM | POA: Insufficient documentation

## 2016-03-17 DIAGNOSIS — M50223 Other cervical disc displacement at C6-C7 level: Secondary | ICD-10-CM | POA: Diagnosis not present

## 2016-03-17 DIAGNOSIS — M4602 Spinal enthesopathy, cervical region: Secondary | ICD-10-CM | POA: Insufficient documentation

## 2016-03-17 DIAGNOSIS — M47892 Other spondylosis, cervical region: Secondary | ICD-10-CM | POA: Insufficient documentation

## 2016-03-17 DIAGNOSIS — M778 Other enthesopathies, not elsewhere classified: Secondary | ICD-10-CM | POA: Diagnosis not present

## 2016-03-23 NOTE — Progress Notes (Signed)
U is    Cardiology Office Note    Date:  03/24/2016   ID:  [name], DOB 16-Oct-1950, MRN [id number]  PCP:  Irven Shelling, MD  Cardiologist: Lawrence Grooms, MD   Chief Complaint  Patient presents with  . Atrial Fibrillation    History of Present Illness:  Lawrence [name]. is a 65 y.o. male for f/u  atrial fibrillation (Dr.Daubert at [facility]) , dofetilide therapy, chronic anticoagulation, hypertension, TIA, and hyperlipidemia.. Prior history of esophagectomy for esophageal cancer 2013.  Patient is 25 with a long-standing history of paroxysmal atrial fibrillation. He developed tachybradycardia syndrome in 2015 had a DDD pacemaker placed at [facility] and was also started on dofetilide for rhythm control. He is now well since that time until approximately 2 months ago when he followed up at [facility] and was told he needed to have a general cardiologist in [place] to service his primary heart specialists and that he would be seen in the device clinic and see Dr. Karn Pickler as needed. That forms the basis for the patient's office visit today. He is asymptomatic. He is in atrial fibrillation. He was in atrial fibrillation when he was seen at [facility] a month ago but cardioversion was not performed because he apparently was having intermittent sinus rhythm when telemetry interrogation was performed.   EKG today demonstrates atrial fibrillation with relatively rapid heart rate.  Past Medical History  Diagnosis Date  . Diabetes mellitus   . Hyperlipidemia     hx of, "weight loss took care of it"  . Anxiety   . Dysrhythmia     a.fib  . GERD (gastroesophageal reflux disease)     occasional before j tube  . H/O hiatal hernia     h/o  . Hypertension     weight loss lowered it  . Atrial fibrillation ([place])   . TIA (transient ischemic attack)     pt. denies this 07-09-15  . Morbid obesity ([place])   .  Long term (current) use of anticoagulants   . Cancer [facility]) 0ct. 22 2013    esophageal Dr. Aurelio Jew thoracic follows  . Esophageal cancer ([place])   . Presence of permanent cardiac pacemaker     Dr.Daubert -[facility] follows    Past Surgical History  Procedure Laterality Date  . Knee arthroscopic surgery  1992  . Tonsillectomy  as child  . Hiatal hernia repair  06/26/2012    Procedure: LAPAROSCOPIC REPAIR OF HIATAL HERNIA;  Surgeon: Madilyn Hook, DO;  Location: [facility] ORS;  Service: General;;  . Incisional hernia repair  06/26/2012    Procedure: HERNIA REPAIR INCISIONAL;  Surgeon: Madilyn Hook, DO;  Location: [facility];  Service: General;;  . Biopsy  06/26/2012    Procedure: BIOPSY;  Surgeon: Madilyn Hook, DO;  Location: [facility];  Service: General;;  . Esophagectomy    . Jejunostomy feeding tube      07-09-15 has been removed.  . Esophagogastroduodenoscopy endoscopy      with stretching every 2 weeks  . Cholecystectomy    . Hernia repair      2x in groin  . Spine biopsy  about 4 weeks ago    "along with fluid", could not reach mass  . Mass excision Left 04/04/2013    Procedure: EXCISION OF SCALP MASS;  Surgeon: Madilyn Hook, DO;  Location: [facility];  Service: General;  Laterality: Left;  . Colonoscopy with propofol N/A  07/13/2015    Procedure: COLONOSCOPY WITH PROPOFOL;  Surgeon: Garlan Fair, MD;  Location: [facility] ENDOSCOPY;  Service: Endoscopy;  Laterality: N/A;    Current Medications: Outpatient Prescriptions Prior to Visit  Medication Sig Dispense Refill  . amLODipine (NORVASC) 5 MG tablet Take 5 mg by mouth daily.  11  . dofetilide (TIKOSYN) 500 MCG capsule Take 500 mcg by mouth 2 (two) times daily.    Marland Kitchen FLUoxetine (PROZAC) 20 MG tablet Take 20 mg by mouth every morning.     Marland Kitchen lisinopril (PRINIVIL,ZESTRIL) 20 MG tablet Take 20 mg by mouth daily.  3  . LORazepam (ATIVAN) 1 MG tablet Place 1 mg under the tongue every 8 (eight) hours as needed for anxiety.     . ranitidine  (ZANTAC) 150 MG tablet Take 150 mg by mouth at bedtime.     . rivaroxaban (XARELTO) 20 MG TABS tablet Take 1 tablet (20 mg total) by mouth daily. 30 tablet 6  . traMADol (ULTRAM) 50 MG tablet Take 1 tablet by mouth every 6 (six) hours as needed.  2  . digoxin (LANOXIN) 0.25 MG tablet Take 0.25 mg by mouth every morning. Reported on 03/24/2016    . acetaminophen (TYLENOL) 500 MG tablet Take 500 mg by mouth every 6 (six) hours as needed (pain). Reported on 03/24/2016    . LANTUS 100 UNIT/ML injection Inject 11 Units into the skin every morning.      No facility-administered medications prior to visit.     Allergies:   Review of patient's allergies indicates no known allergies.   Social History   Social History  . Marital Status: Married    Spouse Name: N/A  . Number of Children: N/A  . Years of Education: N/A   Social History Main Topics  . Smoking status: Never Smoker   . Smokeless tobacco: Never Used  . Alcohol Use: Yes     Comment: socially  . Drug Use: No  . Sexual Activity: Not on file   Other Topics Concern  . Not on file   Social History Narrative     Family History:  The patient's family history includes Cancer in his father.   ROS:   Please see the history of present illness.    Diaphoresis, obesity, and sleep apnea.  All other systems reviewed and are negative.   PHYSICAL EXAM:   VS:  BP 118/84 mmHg  Pulse 98  Ht 6\' 1"  (1.854 m)  Wt 236 lb 12.8 oz (107.412 kg)  BMI 31.25 kg/m2   GEN: Well nourished, well developed, in no acute distress HEENT: normal Neck: no JVD, carotid bruits, or masses Cardiac: IIRR; no murmurs, rubs, or gallops,no edema  Respiratory:  clear to auscultation bilaterally, normal work of breathing GI: soft, nontender, nondistended, + BS MS: no deformity or atrophy Skin: warm and dry, no rash Neuro:  Alert and Oriented x 3, Strength and sensation are intact Psych: euthymic mood, full affect  Wt Readings from Last 3 Encounters:    03/24/16 236 lb 12.8 oz (107.412 kg)  07/13/15 226 lb (102.513 kg)  02/03/14 216 lb 12.8 oz (98.34 kg)      Studies/Labs Reviewed:   EKG:  EKG  Atrial fibrillation with poor rate control and ventricular response of 98 bpm. Otherwise unremarkable.  Recent Labs: No results found for requested labs within last 365 days.   Lipid Panel    Component Value Date/Time   CHOL 157 04/13/2012 0825   TRIG 103 04/13/2012 0825  HDL 40 04/13/2012 0825   CHOLHDL 3.9 04/13/2012 0825   VLDL 21 04/13/2012 0825   LDLCALC 96 04/13/2012 0825    Additional studies/ records that were reviewed today include:  Reviewed Care everywhere. Patient had a DDD pacemaker placed in 2015. He has been followed by Dr. Lurene Shadow at [facility]. He states he has not seen a physician since the pacemaker was placed and that all of his visits have been with physicians assistants since that time. He is on dofetilide for rhythm control.    ASSESSMENT:    1. Chronic atrial fibrillation ([place])   2. Hypertension, benign   3. Long term (current) use of anticoagulants   4. Artificial cardiac pacemaker   5. On dofetilide therapy      PLAN:  In order of problems listed above:  1. Whether or not he has paroxysmal atrial fibrillation needs to be determined. I will try to get information from [facility]. For the time being we will simply increase metoprolol XL to 25 mg twice per day for better rate control. Since he is asymptomatic, we clearly need to consider rate control rather than persisting at attempts for rhythm control. For the time being we will continue dofetilide. He will be seen in the EP at [facility] this coming month and I'll get advice from them. 2. Excellent control on current medical regimen 3. Long-term Xarelto therapy will be continued 4. Will have the patient enroll in our device clinic if he switch his care to [facility]. 5. Continue dofetilide  for the time being in, however switching to rate control may be his best option at this time. We'll await advice from Dr.Daubert. I will have a clinical follow-up with the patient in 3 months.    Medication Adjustments/Labs and Tests Ordered: Current medicines are reviewed at length with the patient today.  Concerns regarding medicines are outlined above.  Medication changes, Labs and Tests ordered today are listed in the Patient Instructions below. Patient Instructions  Medication Instructions:  Your physician has recommended you make the following change in your medication:  INCREASE Metoprolol to 25mg  twice daily an Rx has been sent to your pharmacy   Labwork: None ordered  Testing/Procedures: None ordered  Follow-Up: Your physician recommends that you schedule a follow-up appointment in: 3 months with Dr.Avabella Wailes   Any Other Special Instructions Will Be Listed Below (If Applicable).     If you need a refill on your cardiac medications before your next appointment, please call your pharmacy.       Signed, Lawrence Grooms, MD  03/24/2016 2:31 PM    [facility] [address], [place], [place]  [address] Phone: [phone]; Fax: [phone]

## 2016-03-24 ENCOUNTER — Ambulatory Visit (INDEPENDENT_AMBULATORY_CARE_PROVIDER_SITE_OTHER): Payer: BLUE CROSS/BLUE SHIELD | Admitting: Interventional Cardiology

## 2016-03-24 ENCOUNTER — Encounter: Payer: Self-pay | Admitting: Interventional Cardiology

## 2016-03-24 VITALS — BP 118/84 | HR 98 | Ht 73.0 in | Wt 236.8 lb

## 2016-03-24 DIAGNOSIS — Z950 Presence of cardiac pacemaker: Secondary | ICD-10-CM | POA: Diagnosis not present

## 2016-03-24 DIAGNOSIS — Z7901 Long term (current) use of anticoagulants: Secondary | ICD-10-CM

## 2016-03-24 DIAGNOSIS — Z79899 Other long term (current) drug therapy: Secondary | ICD-10-CM

## 2016-03-24 DIAGNOSIS — I10 Essential (primary) hypertension: Secondary | ICD-10-CM | POA: Diagnosis not present

## 2016-03-24 DIAGNOSIS — I482 Chronic atrial fibrillation, unspecified: Secondary | ICD-10-CM

## 2016-03-24 MED ORDER — METOPROLOL TARTRATE 25 MG PO TABS: 25.0000 mg | ORAL_TABLET | Freq: Two times a day (BID) | ORAL | Status: DC

## 2016-03-24 NOTE — Patient Instructions (Signed)
Medication Instructions:  Your physician has recommended you make the following change in your medication:  INCREASE Metoprolol to 25mg  twice daily an Rx has been sent to your pharmacy   Labwork: None ordered  Testing/Procedures: None ordered  Follow-Up: Your physician recommends that you schedule a follow-up appointment in: 3 months with Dr.Smith   Any Other Special Instructions Will Be Listed Below (If Applicable).     If you need a refill on your cardiac medications before your next appointment, please call your pharmacy.

## 2016-03-26 ENCOUNTER — Encounter (HOSPITAL_COMMUNITY): Payer: Self-pay | Admitting: Emergency Medicine

## 2016-03-26 ENCOUNTER — Emergency Department (HOSPITAL_COMMUNITY)
Admission: EM | Admit: 2016-03-26 | Discharge: 2016-03-26 | Disposition: A | Discharge status: Home / Self Care | Payer: BLUE CROSS/BLUE SHIELD | Attending: Emergency Medicine | Admitting: Emergency Medicine

## 2016-03-26 ENCOUNTER — Emergency Department (HOSPITAL_COMMUNITY): Payer: BLUE CROSS/BLUE SHIELD

## 2016-03-26 DIAGNOSIS — R42 Dizziness and giddiness: Secondary | ICD-10-CM | POA: Diagnosis not present

## 2016-03-26 DIAGNOSIS — G8929 Other chronic pain: Secondary | ICD-10-CM | POA: Insufficient documentation

## 2016-03-26 DIAGNOSIS — M542 Cervicalgia: Secondary | ICD-10-CM | POA: Insufficient documentation

## 2016-03-26 DIAGNOSIS — M5440 Lumbago with sciatica, unspecified side: Secondary | ICD-10-CM | POA: Insufficient documentation

## 2016-03-26 DIAGNOSIS — R55 Syncope and collapse: Secondary | ICD-10-CM | POA: Diagnosis not present

## 2016-03-26 DIAGNOSIS — Z8673 Personal history of transient ischemic attack (TIA), and cerebral infarction without residual deficits: Secondary | ICD-10-CM | POA: Diagnosis not present

## 2016-03-26 DIAGNOSIS — Z8501 Personal history of malignant neoplasm of esophagus: Secondary | ICD-10-CM | POA: Insufficient documentation

## 2016-03-26 DIAGNOSIS — E119 Type 2 diabetes mellitus without complications: Secondary | ICD-10-CM | POA: Diagnosis not present

## 2016-03-26 DIAGNOSIS — E785 Hyperlipidemia, unspecified: Secondary | ICD-10-CM | POA: Diagnosis not present

## 2016-03-26 DIAGNOSIS — Z79899 Other long term (current) drug therapy: Secondary | ICD-10-CM | POA: Insufficient documentation

## 2016-03-26 DIAGNOSIS — I10 Essential (primary) hypertension: Secondary | ICD-10-CM | POA: Diagnosis not present

## 2016-03-26 DIAGNOSIS — Z794 Long term (current) use of insulin: Secondary | ICD-10-CM | POA: Diagnosis not present

## 2016-03-26 DIAGNOSIS — W19XXXA Unspecified fall, initial encounter: Secondary | ICD-10-CM

## 2016-03-26 DIAGNOSIS — I4891 Unspecified atrial fibrillation: Secondary | ICD-10-CM | POA: Insufficient documentation

## 2016-03-26 DIAGNOSIS — Z950 Presence of cardiac pacemaker: Secondary | ICD-10-CM | POA: Insufficient documentation

## 2016-03-26 DIAGNOSIS — M545 Low back pain: Secondary | ICD-10-CM | POA: Diagnosis present

## 2016-03-26 LAB — BASIC METABOLIC PANEL
Anion gap: 7 (ref 5–15)
BUN: 22 mg/dL — AB (ref 6–20)
CHLORIDE: 101 mmol/L (ref 101–111)
CO2: 26 mmol/L (ref 22–32)
CREATININE: 1.01 mg/dL (ref 0.61–1.24)
Calcium: 9.2 mg/dL (ref 8.9–10.3)
GFR calc Af Amer: >60 mL/min (ref >60)
GFR calc non Af Amer: >60 mL/min (ref >60)
Glucose, Bld: 161 mg/dL — ABNORMAL HIGH (ref 65–99)
Potassium: 4.6 mmol/L (ref 3.5–5.1)
Sodium: 134 mmol/L — ABNORMAL LOW (ref 135–145)

## 2016-03-26 LAB — CBC WITH DIFFERENTIAL/PLATELET
Basophils Absolute: 0.1 10*3/uL (ref 0.0–0.1)
Basophils Relative: 1 %
EOS ABS: 0.1 10*3/uL (ref 0.0–0.7)
EOS PCT: 1 %
HCT: 44.8 % (ref 39.0–52.0)
HEMOGLOBIN: 15.7 g/dL (ref 13.0–17.0)
LYMPHS ABS: 1.4 10*3/uL (ref 0.7–4.0)
Lymphocytes Relative: 17 %
MCH: 33.9 pg (ref 26.0–34.0)
MCHC: 35 g/dL (ref 30.0–36.0)
MCV: 96.8 fL (ref 78.0–100.0)
MONOS PCT: 9 %
Monocytes Absolute: 0.8 10*3/uL (ref 0.1–1.0)
NEUTROS PCT: 72 %
Neutro Abs: 6.1 10*3/uL (ref 1.7–7.7)
Platelets: 192 10*3/uL (ref 150–400)
RBC: 4.63 MIL/uL (ref 4.22–5.81)
RDW: 12.7 % (ref 11.5–15.5)
WBC: 8.4 10*3/uL (ref 4.0–10.5)

## 2016-03-26 LAB — I-STAT TROPONIN, ED: TROPONIN I, POC: 0.01 ng/mL (ref 0.00–0.08)

## 2016-03-26 LAB — D-DIMER, QUANTITATIVE: D-Dimer, Quant: <0.27 ug/mL-FEU (ref 0.00–0.50)

## 2016-03-26 NOTE — Discharge Instructions (Signed)
Please stop taking tramadol and lorazepam. I believe your fall today was caused due to multiple medications and drinking alcohol. Please follow-up with your primary care provider to discuss your medications.  Tailbone Injury The tailbone (coccyx) is the small bone at the lower end of the spine. A tailbone injury may involve stretched ligaments, bruising, or a broken bone (fracture). Tailbone injuries can be painful, and some may take a long time to heal. CAUSES This condition is often caused by falling and landing on the tailbone. Other causes include: 1. Repeated strain or friction from actions such as rowing and bicycling. 2. Childbirth. In some cases, the cause may not be known. RISK FACTORS This condition is more common in women than in men. SYMPTOMS Symptoms of this condition include: 1. Pain in the lower back, especially when sitting. 2. Pain or difficulty when standing up from a sitting position. 3. Bruising in the tailbone area. 4. Painful bowel movements. 5. In women, pain during intercourse. DIAGNOSIS This condition may be diagnosed based on your symptoms and a physical exam. X-rays may be taken if a fracture is suspected. You may also have other tests, such as a CT scan or MRI. TREATMENT This condition may be treated with medicines to help relieve your pain. Most tailbone injuries heal on their own in 4-6 weeks. However, recovery time may be longer if the injury involves a fracture. HOME CARE INSTRUCTIONS 1. Take medicines only as directed by your health care provider. 2. If directed, apply ice to the injured area: 1. Put ice in a plastic bag. 2. Place a towel between your skin and the bag. 3. Leave the ice on for 20 minutes, 2-3 times per day for the first 1-2 days. 3. Sit on a large, rubber or inflated ring or cushion to ease your pain. Lean forward when you are sitting to help decrease discomfort. 4. Avoid sitting for long periods of time. 5. Increase your activity as the  pain allows. Perform any exercises that are recommended by your health care provider or physical therapist. 6. If you have pain during bowel movements, use stool softeners as directed by your health care provider. 7. Eat a diet that includes plenty of fiber to help prevent constipation. 8. Keep all follow-up visits as directed by your health care provider. This is important. PREVENTION Wear appropriate padding and sports gear when bicycling and rowing. This can help to prevent developing an injury that is caused by repeated strain or friction. SEEK MEDICAL CARE IF: 1. Your pain becomes worse. 2. Your bowel movements cause a great deal of discomfort. 3. You are unable to have a bowel movement. 4. You have uncontrolled urine loss (urinary incontinence). 5. You have a fever.   This information is not intended to replace advice given to you by your health care provider. Make sure you discuss any questions you have with your health care provider.   Document Released: 08/19/2000 Document Revised: 01/06/2015 Document Reviewed: 08/18/2014 Elsevier Interactive Patient Education 2016 [employer]  Back Exercises The following exercises strengthen the muscles that help to support the back. They also help to keep the lower back flexible. Doing these exercises can help to prevent back pain or lessen existing pain. If you have back pain or discomfort, try doing these exercises 2-3 times each day or as told by your health care provider. When the pain goes away, do them once each day, but increase the number of times that you repeat the steps for each exercise (do  more repetitions). If you do not have back pain or discomfort, do these exercises once each day or as told by your health care provider. EXERCISES Single Knee to Chest Repeat these steps 3-5 times for each leg: 3. Lie on your back on a firm bed or the floor with your legs extended. 4. Bring one knee to your chest. Your other leg should stay  extended and in contact with the floor. 5. Hold your knee in place by grabbing your knee or thigh. 6. Pull on your knee until you feel a gentle stretch in your lower back. 7. Hold the stretch for 10-30 seconds. 8. Slowly release and straighten your leg. Pelvic Tilt Repeat these steps 5-10 times: 6. Lie on your back on a firm bed or the floor with your legs extended. 7. Bend your knees so they are pointing toward the ceiling and your feet are flat on the floor. 8. Tighten your lower abdominal muscles to press your lower back against the floor. This motion will tilt your pelvis so your tailbone points up toward the ceiling instead of pointing to your feet or the floor. 9. With gentle tension and even breathing, hold this position for 5-10 seconds. Cat-Cow Repeat these steps until your lower back becomes more flexible: 9. Get into a hands-and-knees position on a firm surface. Keep your hands under your shoulders, and keep your knees under your hips. You may place padding under your knees for comfort. 10. Let your head hang down, and point your tailbone toward the floor so your lower back becomes rounded like the back of a cat. 11. Hold this position for 5 seconds. 12. Slowly lift your head and point your tailbone up toward the ceiling so your back forms a sagging arch like the back of a cow. 13. Hold this position for 5 seconds. Press-Ups Repeat these steps 5-10 times: 6. Lie on your abdomen (face-down) on the floor. 7. Place your palms near your head, about shoulder-width apart. 8. While you keep your back as relaxed as possible and keep your hips on the floor, slowly straighten your arms to raise the top half of your body and lift your shoulders. Do not use your back muscles to raise your upper torso. You may adjust the placement of your hands to make yourself more comfortable. 9. Hold this position for 5 seconds while you keep your back relaxed. 10. Slowly return to lying flat on the  floor. Bridges Repeat these steps 10 times: 1. Lie on your back on a firm surface. 2. Bend your knees so they are pointing toward the ceiling and your feet are flat on the floor. 3. Tighten your buttocks muscles and lift your buttocks off of the floor until your waist is at almost the same height as your knees. You should feel the muscles working in your buttocks and the back of your thighs. If you do not feel these muscles, slide your feet 1-2 inches farther away from your buttocks. 4. Hold this position for 3-5 seconds. 5. Slowly lower your hips to the starting position, and allow your buttocks muscles to relax completely. If this exercise is too easy, try doing it with your arms crossed over your chest. Abdominal Crunches Repeat these steps 5-10 times: 1. Lie on your back on a firm bed or the floor with your legs extended. 2. Bend your knees so they are pointing toward the ceiling and your feet are flat on the floor. 3. Cross your arms over your chest.  4. Tip your chin slightly toward your chest without bending your neck. 5. Tighten your abdominal muscles and slowly raise your trunk (torso) high enough to lift your shoulder blades a tiny bit off of the floor. Avoid raising your torso higher than that, because it can put too much stress on your low back and it does not help to strengthen your abdominal muscles. 6. Slowly return to your starting position. Back Lifts Repeat these steps 5-10 times: 1. Lie on your abdomen (face-down) with your arms at your sides, and rest your forehead on the floor. 2. Tighten the muscles in your legs and your buttocks. 3. Slowly lift your chest off of the floor while you keep your hips pressed to the floor. Keep the back of your head in line with the curve in your back. Your eyes should be looking at the floor. 4. Hold this position for 3-5 seconds. 5. Slowly return to your starting position. SEEK MEDICAL CARE IF:  Your back pain or discomfort gets much  worse when you do an exercise.  Your back pain or discomfort does not lessen within 2 hours after you exercise. If you have any of these problems, stop doing these exercises right away. Do not do them again unless your health care provider says that you can. SEEK IMMEDIATE MEDICAL CARE IF:  You develop sudden, severe back pain. If this happens, stop doing the exercises right away. Do not do them again unless your health care provider says that you can.   This information is not intended to replace advice given to you by your health care provider. Make sure you discuss any questions you have with your health care provider.   Document Released: 09/29/2004 Document Revised: 05/13/2015 Document Reviewed: 10/16/2014 Elsevier Interactive Patient Education [employer].

## 2016-03-26 NOTE — ED Notes (Signed)
Pt BIB EMS from home; pt fell yesterday after becoming dizzy from taking prescribed hydrocodone; denies hitting head and LOC; pt landed on his tailbone; pt is having increasing pain this AM; normal motor function to limbs.

## 2016-03-26 NOTE — ED Notes (Signed)
PT have been made aware of urine sample. RN at bedside starting IV

## 2016-03-26 NOTE — ED Provider Notes (Signed)
CSN: [id number]     Arrival date & time 03/26/16  [id number] History   First MD Initiated Contact with Patient 03/26/16 [id number]     Chief Complaint  Patient presents with  . Back Pain    Lawrence [name]. is a 65 y.o. male who presents to the ED from home after a fall and possible syncopal episode today. The patient reports he got up in the middle the night to use the restroom and walked about 10 feet and suddenly felt dizzy. He denies feeling dizzy since.  He reports that he was on the floor. He reports hitting his tailbone and is having moderate pain there. He does not believe he hit his head. He is unsure about loss of consciousness. He does not believe he lost consciousness. He did not have any chest pain, SOB or neck pain prior to falling. Patient reports he is due to have an epidural block for a C4, C5 neck pain this coming week. In the meantime he has been taking lots of niacin for pain. He reports he has been taking Norco, Flexeril, lorazepam, gabapentin and tramadol. He reports he does not take them at the same time. His wife at bedside is concerned that he is mixing up his medicines and this has caused him to fall this morning. Patient has a history of chronic A. fib and is on Xarelto. Patient denies fevers, headache, numbness, tingling, weakness, back pain, urinary symptoms, loss of bladder control, loss of bowel control, chest pain, shortness of breath, abdominal pain, nausea, vomiting, diarrhea or rashes.  Later, the patient's wife tells me that the patient has also been drinking alcohol with his medications. She reports last night he drank an entire bottle of wine. She strongly believes that these medications and his drinking alcohol contributed to his fall today.  Patient is a 65 y.o. male presenting with back pain. The history is provided by the patient. No language interpreter was used.  Back Pain Associated symptoms: no abdominal pain, no chest pain, no dysuria, no fever, no headaches,  no numbness and no weakness     Past Medical History  Diagnosis Date  . Diabetes mellitus   . Hyperlipidemia     hx of, "weight loss took care of it"  . Anxiety   . Dysrhythmia     a.fib  . GERD (gastroesophageal reflux disease)     occasional before j tube  . H/O hiatal hernia     h/o  . Hypertension     weight loss lowered it  . Atrial fibrillation ([place])   . TIA (transient ischemic attack)     pt. denies this 07-09-15  . Morbid obesity ([place])   . Long term (current) use of anticoagulants   . Cancer [facility]) 0ct. 22 2013    esophageal Dr. Aurelio Jew thoracic follows  . Esophageal cancer ([place])   . Presence of permanent cardiac pacemaker     Dr.Daubert -[facility] follows   Past Surgical History  Procedure Laterality Date  . Knee arthroscopic surgery  1992  . Tonsillectomy  as child  . Hiatal hernia repair  06/26/2012    Procedure: LAPAROSCOPIC REPAIR OF HIATAL HERNIA;  Surgeon: Madilyn Hook, DO;  Location: [facility];  Service: General;;  . Incisional hernia repair  06/26/2012    Procedure: HERNIA REPAIR INCISIONAL;  Surgeon: Madilyn Hook, DO;  Location: [facility];  Service: General;;  . Biopsy  06/26/2012    Procedure: BIOPSY;  Surgeon: Madilyn Hook, DO;  Location: [facility];  Service: General;;  . Esophagectomy    . Jejunostomy feeding tube      07-09-15 has been removed.  . Esophagogastroduodenoscopy endoscopy      with stretching every 2 weeks  . Cholecystectomy    . Hernia repair      2x in groin  . Spine biopsy  about 4 weeks ago    "along with fluid", could not reach mass  . Mass excision Left 04/04/2013    Procedure: EXCISION OF SCALP MASS;  Surgeon: Madilyn Hook, DO;  Location: [facility];  Service: General;  Laterality: Left;  . Colonoscopy with propofol N/A 07/13/2015    Procedure: COLONOSCOPY WITH PROPOFOL;  Surgeon: Garlan Fair, MD;  Location: [facility];  Service: Endoscopy;  Laterality: N/A;   Family History  Problem Relation Age of Onset  . Cancer  Father     kidney   Social History  Substance Use Topics  . Smoking status: Never Smoker   . Smokeless tobacco: Never Used  . Alcohol Use: Yes     Comment: socially    Review of Systems  Constitutional: Negative for fever and chills.  HENT: Negative for congestion, nosebleeds and sore throat.   Eyes: Negative for pain and visual disturbance.  Respiratory: Negative for cough and shortness of breath.   Cardiovascular: Negative for chest pain, palpitations and leg swelling.  Gastrointestinal: Negative for nausea, vomiting, abdominal pain and diarrhea.  Genitourinary: Negative for dysuria, urgency, frequency, hematuria and difficulty urinating.  Musculoskeletal: Positive for back pain, arthralgias and neck pain (Chronic.). Negative for joint swelling.  Skin: Negative for rash and wound.  Neurological: Positive for dizziness and syncope. Negative for seizures, speech difficulty, weakness, light-headedness, numbness and headaches.      Allergies  Review of patient's allergies indicates no known allergies.  Home Medications   Prior to Admission medications   Medication Sig Start Date End Date Taking? Authorizing Provider  alum & mag hydroxide-simeth (MAALOX/MYLANTA) 200-200-20 MG/5ML suspension Take 15 mLs by mouth every 4 (four) hours as needed for indigestion or heartburn.   Yes Historical Provider, MD  amLODipine (NORVASC) 5 MG tablet Take 5 mg by mouth daily. 06/13/15  Yes Historical Provider, MD  Cyanocobalamin (B-12 PO) Take 1 tablet by mouth daily.   Yes Historical Provider, MD  cyclobenzaprine (FLEXERIL) 10 MG tablet Take 10 mg by mouth at bedtime as needed for muscle spasms.  03/22/16  Yes Historical Provider, MD  dofetilide (TIKOSYN) 500 MCG capsule Take 500 mcg by mouth 2 (two) times daily. 04/27/15  Yes Historical Provider, MD  FLUoxetine (PROZAC) 20 MG tablet Take 20 mg by mouth every morning.  02/23/12  Yes Historical Provider, MD  gabapentin (NEURONTIN) 300 MG capsule Take  300-600 mg by mouth 2 (two) times daily as needed (pain). Pt will take 300mg  during the day for pain, and then may take 600mg  as needed at night for pain 02/26/16  Yes Historical Provider, MD  HYDROcodone-acetaminophen (NORCO/VICODIN) 5-325 MG tablet Take 1-2 tablets by mouth every 6 (six) hours as needed for moderate pain. (pain) 02/23/16  Yes Historical Provider, MD  LANTUS 100 UNIT/ML injection Inject 14 Units into the skin every morning. 03/07/16  Yes Historical Provider, MD  lisinopril (PRINIVIL,ZESTRIL) 20 MG tablet Take 20 mg by mouth daily. 06/13/15  Yes Historical Provider, MD  metoprolol tartrate (LOPRESSOR) 25 MG tablet Take 1 tablet (25 mg total) by mouth 2 (two) times daily. 03/24/16  Yes Belva Crome, MD  rivaroxaban Alveda Reasons)  20 MG TABS tablet Take 1 tablet (20 mg total) by mouth daily. 05/26/14  Yes Belva Crome, MD   BP 127/87 mmHg  Pulse 117  Temp(Src) 98.4 F (36.9 C) (Oral)  Resp 17  SpO2 96% Physical Exam  Constitutional: He is oriented to person, place, and time. He appears well-developed and well-nourished. No distress.  Nontoxic appearing.  HENT:  Head: Normocephalic and atraumatic.  Right Ear: External ear normal.  Left Ear: External ear normal.  Mouth/Throat: Oropharynx is clear and moist.  No visible signs of head trauma.  Eyes: Conjunctivae and EOM are normal. Pupils are equal, round, and reactive to light. Right eye exhibits no discharge. Left eye exhibits no discharge.  Neck: Normal range of motion. Neck supple. No JVD present.  No crepitus or deformity.  Cardiovascular: Normal heart sounds and intact distal pulses.  Exam reveals no gallop and no friction rub.   No murmur heard. Irregular irregular rhythm with a heart rate between 90 and 120 on the monitor.   Pulmonary/Chest: Effort normal and breath sounds normal. No stridor. No respiratory distress. He has no wheezes. He has no rales. He exhibits no tenderness.  Lungs clear to auscultation bilaterally.   Abdominal: Soft. He exhibits no distension. There is no tenderness. There is no guarding.  Abdomen is soft and nontender to palpation.  Musculoskeletal: Normal range of motion. He exhibits tenderness. He exhibits no edema.  Patient has tenderness to his sacrum area. No overlying skin changes. No ecchymosis or crepitus. No midline back tenderness. No deformity. Good strength to his bilateral upper and lower extremities.  Lymphadenopathy:    He has no cervical adenopathy.  Neurological: He is alert and oriented to person, place, and time. No cranial nerve deficit. Coordination normal.  Patient is alert and oriented 3. Sensation is intact in his bilateral upper and lower extremities. Speech is clear and coherent. EOMs are intact. Vision is grossly intact. Cranial nerves are intact.  Skin: Skin is warm and dry. No rash noted. He is not diaphoretic. No erythema. No pallor.  Psychiatric: He has a normal mood and affect. His behavior is normal.  Nursing note and vitals reviewed.   ED Course  Procedures (including critical care time) Labs Review Labs Reviewed  BASIC METABOLIC PANEL - Abnormal; Notable for the following:    Sodium 134 (*)    Glucose, Bld 161 (*)    BUN 22 (*)    All other components within normal limits  CBC WITH DIFFERENTIAL/PLATELET  D-DIMER, QUANTITATIVE (NOT AT [facility])  Randolm Idol, ED    Imaging Review Dg Chest 2 View  03/26/2016  CLINICAL DATA:  Syncope and fell last night; pain in tailbone after fall; no previous injury; hx afib; diabetic; hx eso CA; HTN EXAM: CHEST  2 VIEW COMPARISON:  06/18/2012 FINDINGS: Midline trachea. Pacer with leads at right atrium and right ventricle. No lead discontinuity. Moderate cardiomegaly. No pleural effusion or pneumothorax. No congestive failure. A density projecting over the upper thoracic spine on the lateral view is felt to be present in 2013 and may correspond to posterior right upper lobe nodule on 07/11/2012 PET. Lungs are  otherwise clear. IMPRESSION: No acute cardiopulmonary disease. Cardiomegaly without congestive failure. Electronically Signed   By: Abigail Miyamoto M.D.   On: 03/26/2016 09:09   Dg Sacrum/coccyx  03/26/2016  CLINICAL DATA:  Initial encounter for Syncope and fell last night; pain in tailbone after fall; no previous injury; hx afib; diabetic; hx eso CA; HTN EXAM: SACRUM  AND COCCYX - 2+ VIEW COMPARISON:  CT of 12/01/2004 FINDINGS: Sacroiliac joints are symmetric. Femoral heads are located. No acute fracture. IMPRESSION: No acute osseous abnormality. Electronically Signed   By: Abigail Miyamoto M.D.   On: 03/26/2016 09:12   I have personally reviewed and evaluated these images and lab results as part of my medical decision-making.   EKG Interpretation   Date/Time:  Saturday March 26 2016 08:18:05 EDT Ventricular Rate:  112 PR Interval:    QRS Duration: 88 QT Interval:  308 QTC Calculation: 421 R Axis:   -15 Text Interpretation:  Atrial fibrillation Borderline left axis deviation  Borderline repolarization abnormality Since last tracing rate faster  Confirmed by Eulis Foster  MD, Vira Agar [id number]) on 03/26/2016 8:22:43 AM Also  confirmed by Eulis Foster  MD, ELLIOTT [phone]), editor [place] CT, Leda Gauze [phone])   on 03/26/2016 9:01:28 AM      Filed Vitals:   03/26/16 0724 03/26/16 0818 03/26/16 1120  BP: 137/97 114/85 127/87  Pulse: 112 113 117  Temp: 98.4 F (36.9 C)    TempSrc: Oral    Resp: 18 17 17   SpO2: 96% 98% 96%     MDM   Meds given in ED:  Medications - No data to display  New Prescriptions   No medications on file    Final diagnoses:  Fall, initial encounter  Midline low back pain, with sciatica presence unspecified  Polypharmacy   This is a 65 y.o. male who presents to the ED from home after a fall and possible syncopal episode today. The patient reports he got up in the middle the night to use the restroom and walked about 10 feet and suddenly felt dizzy. He denies feeling dizzy since.  He  reports that he was on the floor. He reports hitting his tailbone and is having moderate pain there. He does not believe he hit his head. He is unsure about loss of consciousness. He does not believe he lost consciousness. He did not have any chest pain, SOB or neck pain prior to falling. Patient reports he is due to have an epidural block for a C4, C5 neck pain this coming week. In the meantime he has been taking lots of niacin for pain. He reports he has been taking Norco, Flexeril, lorazepam, gabapentin and tramadol. He reports he does not take them at the same time. His wife at bedside is concerned that he is mixing up his medicines and this has caused him to fall this morning. Patient has a history of chronic A. fib and is on Xarelto.  Later, the patient's wife tells me that the patient has also been drinking alcohol with his medications. She reports last night he drank an entire bottle of wine. She strongly believes that these medications and his drinking alcohol contributed to his fall today.  On exam the patient is afebrile nontoxic appearing. His no focal neurological deficits. He does have some tenderness to his sacrum and coccyx. No deformity. No overlying skin changes. No other injuries noted.  EKG shows A. fib at a rate of 112. Patient fluctuates between 70 and 110 when I reevaluate the patient. Blood pressure stable. Troponin is not elevated. BMP is unremarkable. CBC is within normal limits. D-dimer is negative. Chest x-ray shows no acute findings. X-ray of his sacrum and coccyx show no osseous abnormality.  At reevaluation patient is resting comfortably. He is alert and oriented and has been the entire visit. I discussed with him that I believe this fall  early this morning was likely related to polypharmacy and alcohol use. He tells me he takes Ativan 1 mg only at night. I advised he should stop taking Ativan. I also advised him to stop taking tramadol. I advised at this time he can continue  taking Norco, gabapentin and a muscle relaxer, but only as prescribed. I advised the accommodation of the narcotic and muscle relaxer as well as gabapentin could possibly be dangerous. I discussed he should never take more than he is prescribed. I also advised he should never drink alcohol while taking narcotics. I discussed the dangers of combine these medications. I encouraged his wife to manage his medications. The patient and his wife agree. They will now have his wife managing all of his medications. I encouraged him to follow-up with his primary care provider this week to review all of his medications and their necessity. I discussed return precautions. I advised the patient to follow-up with their primary care provider this week. I advised the patient to return to the emergency department with new or worsening symptoms or new concerns. The patient verbalized understanding and agreement with plan.      Waynetta Pean, PA-C 03/26/16 [place], MD 03/26/16 [phone]

## 2016-03-26 NOTE — ED Notes (Signed)
Ptar called 

## 2016-03-26 NOTE — ED Notes (Signed)
Bed: WA08 Expected date:  Expected time:  Means of arrival:  Comments: Low back pain

## 2016-04-15 ENCOUNTER — Telehealth: Payer: Self-pay

## 2016-04-15 NOTE — Telephone Encounter (Signed)
Cardiac clearance request received from [facility]. Patient will need to be scheduled to have a 2 level anterior cervical decompression and fusion spanning C5-C7 with instrumentation and allograft with Dr.Dumonski   Clearance should be faxed to [phone] attn:Carla

## 2016-04-19 NOTE — Telephone Encounter (Signed)
Dr. Lynann Bologna,  Lawrence [name] has a history of paroxysmal atrial fibrillation. He is on dofetilide therapy to control rhythm. He intermittently has atrial fibrillation despite dofetilide therapy. His overall cardiovascular status is stable. He is cleared to proceed with the planned anterior cervical decompression and fusion. I would recommend a basic metabolic panel and magnesium level prior to anesthesia to ensure they are within normal range. Dofetilide can cause Torsade de Pointes VT when hypokalemia and hypomagnesemia are present.  Illene Labrador, III, MD

## 2016-04-20 NOTE — Telephone Encounter (Signed)
Cardiac clearance placed in MR nurse fax to be faxed

## 2016-05-18 ENCOUNTER — Other Ambulatory Visit: Payer: Self-pay | Admitting: Orthopedic Surgery

## 2016-05-25 ENCOUNTER — Encounter (HOSPITAL_COMMUNITY): Payer: Self-pay

## 2016-05-25 NOTE — Pre-Procedure Instructions (Signed)
Lawrence [name].  05/25/2016      [facility] [phone] - [address], [address] [place] [place] [address] Phone: [phone] Fax: [phone]    Your procedure is scheduled on Thurs, Sept. 28  Report to [facility] Admitting at 5:30 A.M.  Call this number if you have problems the morning of surgery:  [phone]   Remember:  Do not eat food or drink liquids after midnight.  Take these medicines the morning of surgery with A SIP OF WATER : amlodipine (norvasc), flexeril if needed, tikosyn (dofetilide), fluoxetine (prozac), gabapentin(neurontin), hydrocodone or tramadol if needed, metoprolol (lopressor), ranitidine (zantac)            Stop advil, motrin, ibuprofen, aleve, BC, Goody's ,vitamins and herbal medicines.            Stop xarelto per Dr. Lynann Bologna    How to Manage Your Diabetes Before and After Surgery  Why is it important to control my blood sugar before and after surgery? . Improving blood sugar levels before and after surgery helps healing and can limit problems. . A way of improving blood sugar control is eating a healthy diet by: o  Eating less sugar and carbohydrates o  Increasing activity/exercise o  Talking with your doctor about reaching your blood sugar goals . High blood sugars (greater than 180 mg/dL) can raise your risk of infections and slow your recovery, so you will need to focus on controlling your diabetes during the weeks before surgery. . Make sure that the doctor who takes care of your diabetes knows about your planned surgery including the date and location.  How do I manage my blood sugar before surgery? . Check your blood sugar at least 4 times a day, starting 2 days before surgery, to make sure that the level is not too high or low. o Check your blood sugar the morning of your surgery when you wake up and every 2 hours until you get to the Short Stay unit. . If  your blood sugar is less than 70 mg/dL, you will need to treat for low blood sugar: o Do not take insulin. o Treat a low blood sugar (less than 70 mg/dL) with  cup of clear juice (cranberry or apple), 4 glucose tablets, OR glucose gel. o Recheck blood sugar in 15 minutes after treatment (to make sure it is greater than 70 mg/dL). If your blood sugar is not greater than 70 mg/dL on recheck, call [phone] for further instructions. . Report your blood sugar to the short stay nurse when you get to Short Stay.  . If you are admitted to the hospital after surgery: o Your blood sugar will be checked by the staff and you will probably be given insulin after surgery (instead of oral diabetes medicines) to make sure you have good blood sugar levels. o The goal for blood sugar control after surgery is 80-180 mg/dL.      WHAT DO I DO ABOUT MY DIABETES MEDICATION?        . THE MORNING OF SURGERY, take ________7_____ units of ___lantus_______insulin.   Do not wear jewelry.  Do not wear lotions, powders, or cologne, or deoderant.  Do not shave 48 hours prior to surgery.  Men may shave face and neck.  Do not bring valuables to the hospital.  [facility] is not responsible for any belongings or valuables.  Contacts,  dentures or bridgework may not be worn into surgery.  Leave your suitcase in the car.  After surgery it may be brought to your room.  For patients admitted to the hospital, discharge time will be determined by your treatment team.  Patients discharged the day of surgery will not be allowed to drive home.    Special instructions:  Review preparing for surgery  Please read over the following fact sheets that you were given. Coughing and Deep Breathing and MRSA Information

## 2016-05-26 ENCOUNTER — Telehealth: Payer: Self-pay | Admitting: Interventional Cardiology

## 2016-05-26 ENCOUNTER — Encounter (HOSPITAL_COMMUNITY): Payer: Self-pay

## 2016-05-26 ENCOUNTER — Encounter (HOSPITAL_COMMUNITY)
Admission: RE | Admit: 2016-05-26 | Discharge: 2016-05-26 | Disposition: A | Discharge status: Home / Self Care | Payer: BLUE CROSS/BLUE SHIELD | Source: Ambulatory Visit | Attending: Orthopedic Surgery | Admitting: Orthopedic Surgery

## 2016-05-26 DIAGNOSIS — Z01818 Encounter for other preprocedural examination: Secondary | ICD-10-CM | POA: Diagnosis not present

## 2016-05-26 DIAGNOSIS — M79601 Pain in right arm: Secondary | ICD-10-CM | POA: Diagnosis not present

## 2016-05-26 LAB — COMPREHENSIVE METABOLIC PANEL
ALBUMIN: 3.7 g/dL (ref 3.5–5.0)
ALT: 18 U/L (ref 17–63)
ANION GAP: 8 (ref 5–15)
AST: 44 U/L — AB (ref 15–41)
Alkaline Phosphatase: 103 U/L (ref 38–126)
BUN: 14 mg/dL (ref 6–20)
CO2: 26 mmol/L (ref 22–32)
Calcium: 9.5 mg/dL (ref 8.9–10.3)
Chloride: 103 mmol/L (ref 101–111)
Creatinine, Ser: 1.02 mg/dL (ref 0.61–1.24)
GFR calc Af Amer: >60 mL/min (ref >60)
GLUCOSE: 134 mg/dL — AB (ref 65–99)
POTASSIUM: 5.2 mmol/L — AB (ref 3.5–5.1)
Sodium: 137 mmol/L (ref 135–145)
Total Bilirubin: 1.4 mg/dL — ABNORMAL HIGH (ref 0.3–1.2)
Total Protein: 6.5 g/dL (ref 6.5–8.1)

## 2016-05-26 LAB — URINALYSIS, ROUTINE W REFLEX MICROSCOPIC
Bilirubin Urine: NEGATIVE
GLUCOSE, UA: NEGATIVE mg/dL
Hgb urine dipstick: NEGATIVE
Ketones, ur: NEGATIVE mg/dL
LEUKOCYTES UA: NEGATIVE
Nitrite: NEGATIVE
PH: 5.5 (ref 5.0–8.0)
Protein, ur: NEGATIVE mg/dL
Specific Gravity, Urine: 1.017 (ref 1.005–1.030)

## 2016-05-26 LAB — CBC WITH DIFFERENTIAL/PLATELET
BASOS ABS: 0.1 10*3/uL (ref 0.0–0.1)
BASOS PCT: 1 %
EOS PCT: 2 %
Eosinophils Absolute: 0.2 10*3/uL (ref 0.0–0.7)
HCT: 44 % (ref 39.0–52.0)
Hemoglobin: 15 g/dL (ref 13.0–17.0)
Lymphocytes Relative: 30 %
Lymphs Abs: 2.1 10*3/uL (ref 0.7–4.0)
MCH: 33 pg (ref 26.0–34.0)
MCHC: 34.1 g/dL (ref 30.0–36.0)
MCV: 96.7 fL (ref 78.0–100.0)
MONO ABS: 0.6 10*3/uL (ref 0.1–1.0)
Monocytes Relative: 9 %
Neutro Abs: 3.9 10*3/uL (ref 1.7–7.7)
Neutrophils Relative %: 58 %
PLATELETS: 193 10*3/uL (ref 150–400)
RBC: 4.55 MIL/uL (ref 4.22–5.81)
RDW: 12.5 % (ref 11.5–15.5)
WBC: 6.8 10*3/uL (ref 4.0–10.5)

## 2016-05-26 LAB — MAGNESIUM: Magnesium: 2 mg/dL (ref 1.7–2.4)

## 2016-05-26 LAB — SURGICAL PCR SCREEN
MRSA, PCR: NEGATIVE
Staphylococcus aureus: NEGATIVE

## 2016-05-26 LAB — GLUCOSE, CAPILLARY: GLUCOSE-CAPILLARY: 131 mg/dL — AB (ref 65–99)

## 2016-05-26 NOTE — Progress Notes (Signed)
   05/26/16 0836  OBSTRUCTIVE SLEEP APNEA  Have you ever been diagnosed with sleep apnea through a sleep study? No  Do you snore loudly (loud enough to be heard through closed doors)?  0  Do you often feel tired, fatigued, or sleepy during the daytime (such as falling asleep during driving or talking to someone)? 0  Has anyone observed you stop breathing during your sleep? 1  Do you have, or are you being treated for high blood pressure? 1  BMI more than 35 kg/m2? 0  Age > 50 (1-yes) 1  Neck circumference greater than:Male 16 inches or larger, Male 17inches or larger? 1  Male Gender (Yes=1) 1  Obstructive Sleep Apnea Score 5  Score 5 or greater  Results sent to PCP

## 2016-05-26 NOTE — Telephone Encounter (Signed)
Routed to Dr.Smith to advise 

## 2016-05-26 NOTE — Telephone Encounter (Signed)
New message      Request for surgical clearance:  What type of surgery is being performed?  Cervical spine fusion 1. When is this surgery scheduled?  06-02-16  Are there any medications that need to be held prior to surgery and how long? When can pt stop/restart xarelto?  This was not addressed in the august clearance from Dr Tamala Julian Name of physician performing surgery?  Dr Lynann Bologna What is your office phone and fax number?  Fax [phone]

## 2016-05-26 NOTE — Progress Notes (Addendum)
PCP:Dr. Jola Baptist Cardiologist: Dr. Daneen Schick and dr. Lurene Shadow @ [facility]  Dr. Lurene Shadow note states to stop xarelto 5 days prior to surgery. Pt. Is going to contact Dr. Lynann Bologna and see if he is in agreement also.

## 2016-05-27 NOTE — Telephone Encounter (Signed)
Dr.Smith's response placed in MR nurse fax box to be faxed to Dr.Dumonski's office

## 2016-05-27 NOTE — Telephone Encounter (Signed)
He should discontinue Xarelto at least 72 hours prior to his surgery. He should resume based upon the advice of the surgeon/dentist who is performing the procedure. He needs to wait at least 24 hours before resuming and perhaps longer.

## 2016-05-27 NOTE — Progress Notes (Signed)
Anesthesia Chart Review:  Pt is a 65 year old male scheduled for C5-6, C6-7 ACDF on 06/02/2016 with Phylliss Bob, MD.   - Cardiologist is Daneen Schick, MD (last office visit 03/23/16), who has cleared pt for surgery with the following comment: Mr. Knippenberg has a history of paroxysmal atrial fibrillation. He is on dofetilide therapy to control rhythm. He intermittently has atrial fibrillation despite dofetilide therapy. His overall cardiovascular status is stable. He is cleared to proceed with the planned anterior cervical decompression and fusion. I would recommend a basic metabolic panel and magnesium level prior to anesthesia to ensure they are within normal range. Dofetilide can cause Torsade de Pointes VT when hypokalemia and hypomagnesemia are present.  - EP cardiologist is Clydene Laming, MD (notes in care everywhere) who is aware of upcoming surgery by last office visit note 04/13/16.   - PCP is [name], MD.   PMH includes:  PAF, pacemaker ([employer] - for sinus pauses and bradycardia), HTN, DM, hyperlipidemia, TIA (pt denies), esophageal cancer, GERD.    BP (!) 143/85   Pulse 66   Temp 36.7 C (Oral)   Resp 18   Ht 6\' 1"  (1.854 m)   Wt 238 lb 4 oz (108.1 kg)   SpO2 96%   BMI 31.43 kg/m    Medications include: amlodipine, tikosyn, lantus, lisinopril, metoprolol, zantac, xarelto.  Pt to stop xarelto 72 hours prior to surgery  Preoperative labs reviewed.  CBC w/diff, CMET and magnesium acceptable for surgery. PT/PTT will be obtained DOS.   CXR 03/26/16: No acute cardiopulmonary disease. Cardiomegaly without congestive failure.  EKG 03/26/16: Atrial fibrillation (112 bpm). Borderline left axis deviation. Borderline repolarization abnormality  Carotid duplex 02/05/16: Mild carotid atherosclerosis. No hemodynamically significant ICA stenosis. Degree of narrowing less than 50% bilaterally.  Echo 02/12/14:  - Left ventricle: The cavity size was normal. There was severefocal basal and mild  concentric hypertrophy. Systolic function was normal. The estimated ejection fraction was in the range of 55% to 60%. Wall motion was normal; there were no regional wall motion abnormalities. The study was not technically sufficient to allow evaluation of LV diastolic dysfunction due to atrial fibrillation. - Aortic valve: Severe thickening and calcification, consistent with sclerosis. - Mitral valve: There was trivial regurgitation. - Left atrium: The atrium was mildly dilated. - Tricuspid valve: There was trivial regurgitation.  Perioperative prescription for pacemaker form notes procedure may interfere with device function. Magnet should be placed over device during procedure. Post-op interrogation not needed.   If no changes, I anticipate pt can proceed with surgery as scheduled.   Willeen Cass, FNP-BC [facility]/Anesthesiology Phone: [phone] 05/27/2016 2:39 PM

## 2016-05-30 ENCOUNTER — Telehealth: Payer: Self-pay | Admitting: Interventional Cardiology

## 2016-06-02 ENCOUNTER — Encounter (HOSPITAL_COMMUNITY)
Admission: RE | Disposition: A | Discharge status: Home / Self Care | Payer: Self-pay | Source: Ambulatory Visit | Attending: Orthopedic Surgery

## 2016-06-02 ENCOUNTER — Observation Stay (HOSPITAL_COMMUNITY)
Admission: RE | Admit: 2016-06-02 | Discharge: 2016-06-03 | Disposition: A | Discharge status: Home / Self Care | Payer: BLUE CROSS/BLUE SHIELD | Source: Ambulatory Visit | Attending: Orthopedic Surgery | Admitting: Orthopedic Surgery

## 2016-06-02 ENCOUNTER — Encounter (HOSPITAL_COMMUNITY): Payer: Self-pay | Admitting: *Deleted

## 2016-06-02 ENCOUNTER — Ambulatory Visit (HOSPITAL_COMMUNITY): Payer: BLUE CROSS/BLUE SHIELD

## 2016-06-02 ENCOUNTER — Ambulatory Visit (HOSPITAL_COMMUNITY): Payer: BLUE CROSS/BLUE SHIELD | Admitting: Vascular Surgery

## 2016-06-02 ENCOUNTER — Ambulatory Visit (HOSPITAL_COMMUNITY): Payer: BLUE CROSS/BLUE SHIELD | Admitting: Certified Registered"

## 2016-06-02 DIAGNOSIS — E119 Type 2 diabetes mellitus without complications: Secondary | ICD-10-CM | POA: Insufficient documentation

## 2016-06-02 DIAGNOSIS — Z8673 Personal history of transient ischemic attack (TIA), and cerebral infarction without residual deficits: Secondary | ICD-10-CM | POA: Insufficient documentation

## 2016-06-02 DIAGNOSIS — Z7901 Long term (current) use of anticoagulants: Secondary | ICD-10-CM | POA: Insufficient documentation

## 2016-06-02 DIAGNOSIS — Z950 Presence of cardiac pacemaker: Secondary | ICD-10-CM | POA: Diagnosis not present

## 2016-06-02 DIAGNOSIS — K219 Gastro-esophageal reflux disease without esophagitis: Secondary | ICD-10-CM | POA: Insufficient documentation

## 2016-06-02 DIAGNOSIS — Z01818 Encounter for other preprocedural examination: Secondary | ICD-10-CM

## 2016-06-02 DIAGNOSIS — M5410 Radiculopathy, site unspecified: Secondary | ICD-10-CM | POA: Diagnosis present

## 2016-06-02 DIAGNOSIS — M4802 Spinal stenosis, cervical region: Secondary | ICD-10-CM | POA: Diagnosis present

## 2016-06-02 DIAGNOSIS — Z419 Encounter for procedure for purposes other than remedying health state, unspecified: Secondary | ICD-10-CM

## 2016-06-02 DIAGNOSIS — K449 Diaphragmatic hernia without obstruction or gangrene: Secondary | ICD-10-CM | POA: Insufficient documentation

## 2016-06-02 DIAGNOSIS — I10 Essential (primary) hypertension: Secondary | ICD-10-CM | POA: Diagnosis not present

## 2016-06-02 DIAGNOSIS — Z8501 Personal history of malignant neoplasm of esophagus: Secondary | ICD-10-CM | POA: Diagnosis not present

## 2016-06-02 DIAGNOSIS — M50122 Cervical disc disorder at C5-C6 level with radiculopathy: Secondary | ICD-10-CM | POA: Insufficient documentation

## 2016-06-02 DIAGNOSIS — I4891 Unspecified atrial fibrillation: Secondary | ICD-10-CM | POA: Insufficient documentation

## 2016-06-02 DIAGNOSIS — Z6831 Body mass index (BMI) 31.0-31.9, adult: Secondary | ICD-10-CM | POA: Diagnosis not present

## 2016-06-02 HISTORY — PX: ANTERIOR CERVICAL DECOMP/DISCECTOMY FUSION: SHX1161

## 2016-06-02 LAB — GLUCOSE, CAPILLARY
GLUCOSE-CAPILLARY: 143 mg/dL — AB (ref 65–99)
GLUCOSE-CAPILLARY: 122 mg/dL — AB (ref 65–99)
Glucose-Capillary: 159 mg/dL — ABNORMAL HIGH (ref 65–99)
Glucose-Capillary: 171 mg/dL — ABNORMAL HIGH (ref 65–99)

## 2016-06-02 LAB — APTT: APTT: 27 s (ref 24–36)

## 2016-06-02 LAB — PROTIME-INR
INR: 1.01
Prothrombin Time: 13.3 seconds (ref 11.4–15.2)

## 2016-06-02 SURGERY — ANTERIOR CERVICAL DECOMPRESSION/DISCECTOMY FUSION 2 LEVELS
Anesthesia: General

## 2016-06-02 MED ORDER — ONDANSETRON HCL 4 MG/2ML IJ SOLN
INTRAMUSCULAR | Status: AC
  Filled 2016-06-02: qty 2

## 2016-06-02 MED ORDER — 0.9 % SODIUM CHLORIDE (POUR BTL) OPTIME
TOPICAL | Status: DC | PRN
  Administered 2016-06-02: 1000 mL

## 2016-06-02 MED ORDER — THROMBIN 20000 UNITS EX SOLR
CUTANEOUS | Status: AC
  Filled 2016-06-02: qty 20000

## 2016-06-02 MED ORDER — HYDROMORPHONE HCL 1 MG/ML IJ SOLN
0.2500 mg | INTRAMUSCULAR | Status: DC | PRN
  Administered 2016-06-02 (×2): 0.5 mg via INTRAVENOUS

## 2016-06-02 MED ORDER — BISACODYL 5 MG PO TBEC: 5.0000 mg | DELAYED_RELEASE_TABLET | Freq: Every day | ORAL | Status: DC | PRN

## 2016-06-02 MED ORDER — FAMOTIDINE 20 MG PO TABS
20.0000 mg | ORAL_TABLET | Freq: Every day | ORAL | Status: DC
  Administered 2016-06-02 – 2016-06-03 (×2): 20 mg via ORAL
  Filled 2016-06-02 (×2): qty 1

## 2016-06-02 MED ORDER — ONDANSETRON HCL 4 MG/2ML IJ SOLN
INTRAMUSCULAR | Status: DC | PRN
  Administered 2016-06-02: 4 mg via INTRAVENOUS

## 2016-06-02 MED ORDER — METOPROLOL TARTRATE 25 MG PO TABS
25.0000 mg | ORAL_TABLET | Freq: Two times a day (BID) | ORAL | Status: DC
  Administered 2016-06-02 – 2016-06-03 (×2): 25 mg via ORAL
  Filled 2016-06-02 (×2): qty 1

## 2016-06-02 MED ORDER — BUPIVACAINE-EPINEPHRINE 0.25% -1:200000 IJ SOLN
INTRAMUSCULAR | Status: DC | PRN
  Administered 2016-06-02: 3 mL

## 2016-06-02 MED ORDER — SUGAMMADEX SODIUM 200 MG/2ML IV SOLN
INTRAVENOUS | Status: AC
  Filled 2016-06-02: qty 2

## 2016-06-02 MED ORDER — ROCURONIUM BROMIDE 10 MG/ML (PF) SYRINGE
PREFILLED_SYRINGE | INTRAVENOUS | Status: AC
  Filled 2016-06-02: qty 10

## 2016-06-02 MED ORDER — THROMBIN 20000 UNITS EX KIT
PACK | CUTANEOUS | Status: DC | PRN
  Administered 2016-06-02: 20000 [IU] via TOPICAL

## 2016-06-02 MED ORDER — LISINOPRIL 20 MG PO TABS
20.0000 mg | ORAL_TABLET | Freq: Every day | ORAL | Status: DC
  Administered 2016-06-02 – 2016-06-03 (×2): 20 mg via ORAL
  Filled 2016-06-02 (×3): qty 1

## 2016-06-02 MED ORDER — SENNOSIDES-DOCUSATE SODIUM 8.6-50 MG PO TABS: 1.0000 | ORAL_TABLET | Freq: Every evening | ORAL | Status: DC | PRN

## 2016-06-02 MED ORDER — DOFETILIDE 500 MCG PO CAPS
500.0000 ug | ORAL_CAPSULE | Freq: Two times a day (BID) | ORAL | Status: DC
  Administered 2016-06-02 – 2016-06-03 (×2): 500 ug via ORAL
  Filled 2016-06-02 (×2): qty 1

## 2016-06-02 MED ORDER — LACTATED RINGERS IV SOLN
INTRAVENOUS | Status: DC | PRN
  Administered 2016-06-02 (×3): via INTRAVENOUS

## 2016-06-02 MED ORDER — PROPOFOL 10 MG/ML IV BOLUS
INTRAVENOUS | Status: DC | PRN
  Administered 2016-06-02: 200 mg via INTRAVENOUS

## 2016-06-02 MED ORDER — MENTHOL 3 MG MT LOZG: 1.0000 | LOZENGE | OROMUCOSAL | Status: DC | PRN

## 2016-06-02 MED ORDER — ACETAMINOPHEN 650 MG RE SUPP: 650.0000 mg | RECTAL | Status: DC | PRN

## 2016-06-02 MED ORDER — LIDOCAINE 2% (20 MG/ML) 5 ML SYRINGE
INTRAMUSCULAR | Status: AC
  Filled 2016-06-02: qty 5

## 2016-06-02 MED ORDER — ROCURONIUM BROMIDE 100 MG/10ML IV SOLN
INTRAVENOUS | Status: DC | PRN
  Administered 2016-06-02: 30 mg via INTRAVENOUS
  Administered 2016-06-02 (×2): 50 mg via INTRAVENOUS

## 2016-06-02 MED ORDER — OXYCODONE-ACETAMINOPHEN 5-325 MG PO TABS
1.0000 | ORAL_TABLET | ORAL | Status: DC | PRN
  Administered 2016-06-02 – 2016-06-03 (×5): 2 via ORAL
  Filled 2016-06-02 (×5): qty 2

## 2016-06-02 MED ORDER — LIDOCAINE HCL (CARDIAC) 20 MG/ML IV SOLN
INTRAVENOUS | Status: DC | PRN
  Administered 2016-06-02: 80 mg via INTRAVENOUS

## 2016-06-02 MED ORDER — PROMETHAZINE HCL 25 MG/ML IJ SOLN: 6.2500 mg | INTRAMUSCULAR | Status: DC | PRN

## 2016-06-02 MED ORDER — CEFAZOLIN IN D5W 1 GM/50ML IV SOLN
1.0000 g | Freq: Three times a day (TID) | INTRAVENOUS | Status: AC
  Administered 2016-06-02 (×2): 1 g via INTRAVENOUS
  Filled 2016-06-02 (×2): qty 50

## 2016-06-02 MED ORDER — DOCUSATE SODIUM 100 MG PO CAPS
100.0000 mg | ORAL_CAPSULE | Freq: Two times a day (BID) | ORAL | Status: DC
  Administered 2016-06-02 – 2016-06-03 (×2): 100 mg via ORAL
  Filled 2016-06-02: qty 1

## 2016-06-02 MED ORDER — MORPHINE SULFATE (PF) 2 MG/ML IV SOLN
1.0000 mg | INTRAVENOUS | Status: DC | PRN
  Administered 2016-06-02: 2 mg via INTRAVENOUS
  Filled 2016-06-02: qty 1

## 2016-06-02 MED ORDER — SUGAMMADEX SODIUM 200 MG/2ML IV SOLN
INTRAVENOUS | Status: DC | PRN
  Administered 2016-06-02: 200 mg via INTRAVENOUS

## 2016-06-02 MED ORDER — SODIUM CHLORIDE 0.9% FLUSH: 3.0000 mL | INTRAVENOUS | Status: DC | PRN

## 2016-06-02 MED ORDER — PHENOL 1.4 % MT LIQD
1.0000 | OROMUCOSAL | Status: DC | PRN
  Administered 2016-06-02: 1 via OROMUCOSAL
  Filled 2016-06-02: qty 177

## 2016-06-02 MED ORDER — VASOPRESSIN 20 UNIT/ML IV SOLN
INTRAVENOUS | Status: DC | PRN
  Administered 2016-06-02: 1 [IU] via INTRAVENOUS

## 2016-06-02 MED ORDER — BUPIVACAINE-EPINEPHRINE (PF) 0.25% -1:200000 IJ SOLN
INTRAMUSCULAR | Status: AC
  Filled 2016-06-02: qty 30

## 2016-06-02 MED ORDER — PROPOFOL 10 MG/ML IV BOLUS
INTRAVENOUS | Status: AC
  Filled 2016-06-02: qty 20

## 2016-06-02 MED ORDER — SODIUM CHLORIDE 0.9% FLUSH
3.0000 mL | Freq: Two times a day (BID) | INTRAVENOUS | Status: DC
  Administered 2016-06-02 (×2): 3 mL via INTRAVENOUS

## 2016-06-02 MED ORDER — POVIDONE-IODINE 7.5 % EX SOLN
Freq: Once | CUTANEOUS | Status: DC
  Filled 2016-06-02: qty 118

## 2016-06-02 MED ORDER — EPHEDRINE 5 MG/ML INJ
INTRAVENOUS | Status: AC
  Filled 2016-06-02: qty 10

## 2016-06-02 MED ORDER — ACETAMINOPHEN 325 MG PO TABS: 650.0000 mg | ORAL_TABLET | ORAL | Status: DC | PRN

## 2016-06-02 MED ORDER — ONDANSETRON HCL 4 MG/2ML IJ SOLN
4.0000 mg | INTRAMUSCULAR | Status: DC | PRN
Start: 2016-06-02 — End: 2016-06-03

## 2016-06-02 MED ORDER — INSULIN GLARGINE 100 UNIT/ML ~~LOC~~ SOLN
14.0000 [IU] | SUBCUTANEOUS | Status: DC
  Administered 2016-06-03: 14 [IU] via SUBCUTANEOUS
  Filled 2016-06-02: qty 0.14

## 2016-06-02 MED ORDER — PHENYLEPHRINE HCL 10 MG/ML IJ SOLN
INTRAMUSCULAR | Status: DC | PRN
  Administered 2016-06-02 (×2): 120 ug via INTRAVENOUS
  Administered 2016-06-02: 80 ug via INTRAVENOUS
  Administered 2016-06-02 (×3): 120 ug via INTRAVENOUS

## 2016-06-02 MED ORDER — FENTANYL CITRATE (PF) 100 MCG/2ML IJ SOLN
INTRAMUSCULAR | Status: DC | PRN
  Administered 2016-06-02 (×6): 50 ug via INTRAVENOUS

## 2016-06-02 MED ORDER — PHENYLEPHRINE HCL 10 MG/ML IJ SOLN
INTRAVENOUS | Status: DC | PRN
  Administered 2016-06-02: 30 ug/min via INTRAVENOUS

## 2016-06-02 MED ORDER — MIDAZOLAM HCL 5 MG/5ML IJ SOLN
INTRAMUSCULAR | Status: DC | PRN
  Administered 2016-06-02: 2 mg via INTRAVENOUS

## 2016-06-02 MED ORDER — DIAZEPAM 5 MG PO TABS
5.0000 mg | ORAL_TABLET | Freq: Four times a day (QID) | ORAL | Status: DC | PRN
  Administered 2016-06-02 – 2016-06-03 (×3): 5 mg via ORAL
  Filled 2016-06-02 (×3): qty 1

## 2016-06-02 MED ORDER — VITAMIN B-12 1000 MCG PO TABS
1000.0000 ug | ORAL_TABLET | ORAL | Status: DC
  Administered 2016-06-03: 1000 ug via ORAL
  Filled 2016-06-02: qty 1

## 2016-06-02 MED ORDER — SUCCINYLCHOLINE CHLORIDE 20 MG/ML IJ SOLN
INTRAMUSCULAR | Status: DC | PRN
  Administered 2016-06-02: 120 mg via INTRAVENOUS

## 2016-06-02 MED ORDER — ALUM & MAG HYDROXIDE-SIMETH 200-200-20 MG/5ML PO SUSP: 30.0000 mL | Freq: Four times a day (QID) | ORAL | Status: DC | PRN

## 2016-06-02 MED ORDER — FENTANYL CITRATE (PF) 100 MCG/2ML IJ SOLN
INTRAMUSCULAR | Status: AC
  Filled 2016-06-02: qty 4

## 2016-06-02 MED ORDER — FLEET ENEMA 7-19 GM/118ML RE ENEM: 1.0000 | ENEMA | Freq: Once | RECTAL | Status: DC | PRN

## 2016-06-02 MED ORDER — FLUOXETINE HCL 20 MG PO TABS
20.0000 mg | ORAL_TABLET | Freq: Every morning | ORAL | Status: DC
  Filled 2016-06-02: qty 1

## 2016-06-02 MED ORDER — MIDAZOLAM HCL 2 MG/2ML IJ SOLN
INTRAMUSCULAR | Status: AC
  Filled 2016-06-02: qty 2

## 2016-06-02 MED ORDER — VASOPRESSIN 20 UNIT/ML IV SOLN
INTRAVENOUS | Status: AC
  Filled 2016-06-02: qty 1

## 2016-06-02 MED ORDER — LORAZEPAM 0.5 MG PO TABS
1.0000 mg | ORAL_TABLET | Freq: Every day | ORAL | Status: DC
  Administered 2016-06-02: 1 mg via ORAL
  Filled 2016-06-02: qty 2

## 2016-06-02 MED ORDER — RIVAROXABAN 20 MG PO TABS: 20.0000 mg | ORAL_TABLET | Freq: Every day | ORAL | 6 refills | Status: DC

## 2016-06-02 MED ORDER — CEFAZOLIN SODIUM-DEXTROSE 2-4 GM/100ML-% IV SOLN
2.0000 g | INTRAVENOUS | Status: AC
  Administered 2016-06-02: 2 g via INTRAVENOUS
  Filled 2016-06-02: qty 100

## 2016-06-02 MED ORDER — GABAPENTIN 300 MG PO CAPS
300.0000 mg | ORAL_CAPSULE | Freq: Three times a day (TID) | ORAL | Status: DC
  Administered 2016-06-02 – 2016-06-03 (×3): 300 mg via ORAL
  Filled 2016-06-02 (×3): qty 1

## 2016-06-02 MED ORDER — AMLODIPINE BESYLATE 5 MG PO TABS
5.0000 mg | ORAL_TABLET | Freq: Every day | ORAL | Status: DC
  Administered 2016-06-03: 5 mg via ORAL
  Filled 2016-06-02: qty 1

## 2016-06-02 MED ORDER — ZOLPIDEM TARTRATE 5 MG PO TABS
5.0000 mg | ORAL_TABLET | Freq: Every evening | ORAL | Status: DC | PRN
  Administered 2016-06-02: 5 mg via ORAL
  Filled 2016-06-02: qty 1

## 2016-06-02 MED ORDER — PHENYLEPHRINE 40 MCG/ML (10ML) SYRINGE FOR IV PUSH (FOR BLOOD PRESSURE SUPPORT)
PREFILLED_SYRINGE | INTRAVENOUS | Status: AC
  Filled 2016-06-02: qty 10

## 2016-06-02 MED ORDER — EPHEDRINE SULFATE 50 MG/ML IJ SOLN
INTRAMUSCULAR | Status: DC | PRN
  Administered 2016-06-02: 10 mg via INTRAVENOUS
  Administered 2016-06-02: 20 mg via INTRAVENOUS

## 2016-06-02 MED ORDER — HYDROMORPHONE HCL 1 MG/ML IJ SOLN
INTRAMUSCULAR | Status: AC
  Administered 2016-06-02: 0.5 mg via INTRAVENOUS
  Filled 2016-06-02: qty 1

## 2016-06-02 SURGICAL SUPPLY — 77 items
APL SKNCLS STERI-STRIP NONHPOA (GAUZE/BANDAGES/DRESSINGS) ×1
BENZOIN TINCTURE PRP APPL 2/3 (GAUZE/BANDAGES/DRESSINGS) ×2 IMPLANT
BIT DRILL NEURO 2X3.1 SFT TUCH (MISCELLANEOUS) ×1 IMPLANT
BIT DRILL SKYLINE 16MM (BIT) IMPLANT
BIT DRILL SRG 14X2.2XFLT CHK (BIT) IMPLANT
BIT DRL SRG 14X2.2XFLT CHK (BIT) ×1
BLADE SURG 15 STRL LF DISP TIS (BLADE) ×1 IMPLANT
BLADE SURG 15 STRL SS (BLADE) ×2
BLADE SURG ROTATE 9660 (MISCELLANEOUS) ×2 IMPLANT
CARTRIDGE OIL MAESTRO DRILL (MISCELLANEOUS) ×1 IMPLANT
CORDS BIPOLAR (ELECTRODE) ×2 IMPLANT
COVER SURGICAL LIGHT HANDLE (MISCELLANEOUS) ×2 IMPLANT
CRADLE DONUT ADULT HEAD (MISCELLANEOUS) ×2 IMPLANT
DECANTER SPIKE VIAL GLASS SM (MISCELLANEOUS) ×1 IMPLANT
DEVICE ENDSKLTN LG TC 6VBR 7MM (Orthopedic Implant) IMPLANT
DIFFUSER DRILL AIR PNEUMATIC (MISCELLANEOUS) ×2 IMPLANT
DRAIN JACKSON RD 7FR 3/32 (WOUND CARE) IMPLANT
DRAPE C-ARM 42X72 X-RAY (DRAPES) ×2 IMPLANT
DRAPE POUCH INSTRU U-SHP 10X18 (DRAPES) ×2 IMPLANT
DRAPE SURG 17X23 STRL (DRAPES) ×12 IMPLANT
DRILL BIT SKYLINE 14MM (BIT) ×2
DRILL NEURO 2X3.1 SOFT TOUCH (MISCELLANEOUS) ×2
DRILL SKYLINE 16MM (BIT) ×2
DURAPREP 26ML APPLICATOR (WOUND CARE) ×2 IMPLANT
ELECT COATED BLADE 2.86 ST (ELECTRODE) ×2 IMPLANT
ELECT REM PT RETURN 9FT ADLT (ELECTROSURGICAL) ×2
ELECTRODE REM PT RTRN 9FT ADLT (ELECTROSURGICAL) ×1 IMPLANT
ENDOSKELETON LG TC 6VBR 7MM (Orthopedic Implant) ×2 IMPLANT
ENDOSKELETON LG TC 6VBR 8MM (Orthopedic Implant) ×1 IMPLANT
GAUZE SPONGE 4X4 12PLY STRL (GAUZE/BANDAGES/DRESSINGS) ×2 IMPLANT
GAUZE SPONGE 4X4 16PLY XRAY LF (GAUZE/BANDAGES/DRESSINGS) ×2 IMPLANT
GLOVE BIO SURGEON STRL SZ7 (GLOVE) ×5 IMPLANT
GLOVE BIO SURGEON STRL SZ8 (GLOVE) ×2 IMPLANT
GLOVE BIOGEL PI IND STRL 7.0 (GLOVE) ×2 IMPLANT
GLOVE BIOGEL PI IND STRL 7.5 (GLOVE) IMPLANT
GLOVE BIOGEL PI IND STRL 8 (GLOVE) ×1 IMPLANT
GLOVE BIOGEL PI INDICATOR 7.0 (GLOVE) ×2
GLOVE BIOGEL PI INDICATOR 7.5 (GLOVE) ×1
GLOVE BIOGEL PI INDICATOR 8 (GLOVE) ×1
GOWN STRL REUS W/ TWL LRG LVL3 (GOWN DISPOSABLE) ×1 IMPLANT
GOWN STRL REUS W/ TWL XL LVL3 (GOWN DISPOSABLE) ×1 IMPLANT
GOWN STRL REUS W/TWL LRG LVL3 (GOWN DISPOSABLE) ×4
GOWN STRL REUS W/TWL XL LVL3 (GOWN DISPOSABLE) ×4
IV CATH 14GX2 1/4 (CATHETERS) ×2 IMPLANT
KIT BASIN OR (CUSTOM PROCEDURE TRAY) ×2 IMPLANT
KIT ROOM TURNOVER OR (KITS) ×2 IMPLANT
MANIFOLD NEPTUNE II (INSTRUMENTS) ×2 IMPLANT
NDL SPNL 20GX3.5 QUINCKE YW (NEEDLE) ×1 IMPLANT
NEEDLE 27GAX1X1/2 (NEEDLE) ×2 IMPLANT
NEEDLE SPNL 20GX3.5 QUINCKE YW (NEEDLE) ×2 IMPLANT
NS IRRIG 1000ML POUR BTL (IV SOLUTION) ×2 IMPLANT
OIL CARTRIDGE MAESTRO DRILL (MISCELLANEOUS) ×2
PACK ORTHO CERVICAL (CUSTOM PROCEDURE TRAY) ×2 IMPLANT
PAD ARMBOARD 7.5X6 YLW CONV (MISCELLANEOUS) ×4 IMPLANT
PATTIES SURGICAL .5 X.5 (GAUZE/BANDAGES/DRESSINGS) IMPLANT
PATTIES SURGICAL .5 X1 (DISPOSABLE) ×2 IMPLANT
PIN DISTRACTION 14 (PIN) ×2 IMPLANT
PLATE SKYLINE TWO LEVEL 32MM (Plate) ×1 IMPLANT
PUTTY BONE DBX 5CC MIX (Putty) ×1 IMPLANT
SCREW RESCUE SKYLINE 16MM (Screw) ×1 IMPLANT
SCREW SKYLINE VAR OS 14MM (Screw) ×6 IMPLANT
SPONGE INTESTINAL PEANUT (DISPOSABLE) ×5 IMPLANT
SPONGE SURGIFOAM ABS GEL 100 (HEMOSTASIS) ×2 IMPLANT
STRIP CLOSURE SKIN 1/2X4 (GAUZE/BANDAGES/DRESSINGS) ×2 IMPLANT
SURGIFLO W/THROMBIN 8M KIT (HEMOSTASIS) IMPLANT
SUT MNCRL AB 4-0 PS2 18 (SUTURE) ×2 IMPLANT
SUT SILK 4 0 (SUTURE)
SUT SILK 4-0 18XBRD TIE 12 (SUTURE) IMPLANT
SUT VIC AB 2-0 CT2 18 VCP726D (SUTURE) ×2 IMPLANT
SYR BULB IRRIGATION 50ML (SYRINGE) ×2 IMPLANT
SYR CONTROL 10ML LL (SYRINGE) ×6 IMPLANT
TAPE CLOTH 4X10 WHT NS (GAUZE/BANDAGES/DRESSINGS) ×2 IMPLANT
TAPE UMBILICAL COTTON 1/8X30 (MISCELLANEOUS) ×2 IMPLANT
TOWEL OR 17X24 6PK STRL BLUE (TOWEL DISPOSABLE) ×2 IMPLANT
TOWEL OR 17X26 10 PK STRL BLUE (TOWEL DISPOSABLE) ×2 IMPLANT
WATER STERILE IRR 1000ML POUR (IV SOLUTION) ×1 IMPLANT
YANKAUER SUCT BULB TIP NO VENT (SUCTIONS) ×2 IMPLANT

## 2016-06-02 NOTE — Op Note (Signed)
Date of Surgery: 06/02/2016  PREOPERATIVE DIAGNOSES: 1. Right-sided cervical radiculopathy. 2. Neuroforaminal stenosis spanning C5-C7 3. Severe DDD C5-C7 4. S/p previous extensive esophageal surgery for cancer  POSTOPERATIVE DIAGNOSES: 1. Right-sided cervical radiculopathy. 2. Neuroforaminal stenosis spanning C5-C7 3. Severe DDD C5-C7 4. S/p previous extensive esophageal surgery for cancer  PROCEDURE: 1. Anterior cervical decompression and fusion C5-C7 2. Placement of anterior instrumentation, C5-C7 3. Insertion of interbody device x 2 (Titan intervertebral spacers). 4. Use of morselized allograft. 5. Intraoperative use of fluoroscopy.  SURGEON: Phylliss Bob, MD.  ASSISTANTLonn Georgia McKenzie PA-C.  ANESTHESIA: General endotracheal anesthesia.  COMPLICATIONS: None.  DISPOSITION: Stable.  ESTIMATED BLOOD LOSS: Minimal.  INDICATIONS FOR SURGERY: Briefly, Mr. Nerby is a pleasant 65 year old male, who did present to me with a history of ongoing pain in the right arm. An MRI did reveal the findings noted above. Given the  patient's ongoing pain and the findings on her MRI and lack of improvement with  Appropriate nonoperative measures, we did discuss proceeding with the procedure noted above. The patient was fully aware of the risks and limitations of the surgery, and did elect to proceed. Of note, the patient had a significant esophageal surgery, and as a result there were significant adhesions in the esophageal region.   OPERATIVE DETAILS: On 06/02/2016, the patient was brought to surgery and general endotracheal anesthesia was administered. The patient was placed supine on the hospital bed. The patient's neck was gently extended. The patient's arms were secured to his sides. All bony prominences were padded. The neck was prepped and draped. A right-sided transverse incision was then made. The platysma was incised. A Alben Deeds approach  was utilized.Of note, this was an extremely meticulous aspect of the surgery. There were substantial adhesions between the esophagus and anterior spine. The exposure did take a significant amount of time in order to be done safely.  Exposure typically takes 5-10 minutes, but given the adhesions encountered, it did take about 60 minutes to perform safely. A self-retaining retractor was placed and the vertebral bodies to be fused were subperiosteally exposed. Caspar pins were then placed into the C6 and C7 vertebral bodies and distraction was applied. I then proceeded with a thorough and complete C6/7 intervertebral diskectomy. Again, this was very meticulous, as exposure was limited due to the adhesion and scar tissue, however, I was ultimately very pleased with the decompression  that I was able to accomplish. An appropriate decompression was confirmed  using a nerve hook bilaterally. The endplates were then prepared and the appropriate sized interbody spacer (49mm) was packed with DBX mix and tamped into position in the usual fashion. The lower Caspar pin was removed and bone wax was then placed in its place. A new Caspar pin was placed into the C5 vertebral body and distraction was applied across the C5/6 intervertebral space. Once again, a thorough diskectomy was performed, and was again, this was meticulous. There was no stenosis noted at the completion of the diskectomy, this was confirmed using a nerve hook bilaterally. The endplates were then prepared and the appropriate sized interbody spacer (79mm) was packed with DBX mix and tamped into position in the usual fashion. I was very pleased with the press-fit of the spacers. The Caspar pins were removed and bone wax was placed in their place. I then selected the appropriate sized anterior cervical plate, which was placed over the anterior spine. 14 mm variable angle screws were placed, 2 in each vertebral body from C5  to C7  for a total of 6 vertebral body screws.Of note, a 16 mm screw was placed On the left at C7, as the bone quality was very suboptimal at C7. The screws were then locked to the plate using the Cam locking mechanism.I was very pleased with the final fluoroscopic images. The wound was then irrigated. All bleeding was controlled using bipolar electrocautery. The wound was then closed in layers. The platysma was closed using 2-0 Vicryl and the skin was closed using 4-0 Monocryl. Benzoin and Steri-Strips were then applied followed by sterile dressing. All instrument counts were correct at the termination of the procedure.  Of note, Pricilla Holm was my assistant throughout surgery, and did aid in retraction, suctioning, and closure from start to finish.     Phylliss Bob, MD

## 2016-06-02 NOTE — H&P (Signed)
PREOPERATIVE H&P  Chief Complaint: R arm pain  HPI: Lawrence [name]. is a 65 y.o. male who presents with ongoing pain in the right arm x 6 months  MRI reveals NF stenosis on the right at C5/6 and C6/7.  Patient has failed multiple forms of conservative care and continues to have pain (see office notes for additional details regarding the patient's full course of treatment)  Past Medical History:  Diagnosis Date  . Atrial fibrillation ([place])   . Cancer [facility]) 0ct. 22 2013   esophageal Dr. Aurelio Jew thoracic follows  . Diabetes mellitus   . Dysrhythmia    a.fib  . Esophageal cancer ([place])   . GERD (gastroesophageal reflux disease)    occasional before j tube  . H/O hiatal hernia    h/o  . Hyperlipidemia    hx of, "weight loss took care of it"  . Hypertension    weight loss lowered it  . Long term (current) use of anticoagulants   . Morbid obesity ([place])   . Presence of permanent cardiac pacemaker    Dr.Daubert [facility] follows  . TIA (transient ischemic attack)    pt. denies this 07-09-15   Past Surgical History:  Procedure Laterality Date  . BIOPSY  06/26/2012   Procedure: BIOPSY;  Surgeon: Madilyn Hook, DO;  Location: [facility] ORS;  Service: General;;  . CHOLECYSTECTOMY    . COLONOSCOPY WITH PROPOFOL N/A 07/13/2015   Procedure: COLONOSCOPY WITH PROPOFOL;  Surgeon: Garlan Fair, MD;  Location: [facility] ENDOSCOPY;  Service: Endoscopy;  Laterality: N/A;  . ESOPHAGECTOMY    . ESOPHAGOGASTRODUODENOSCOPY ENDOSCOPY     with stretching every 2 weeks  . HERNIA REPAIR     2x in groin  . HIATAL HERNIA REPAIR  06/26/2012   Procedure: LAPAROSCOPIC REPAIR OF HIATAL HERNIA;  Surgeon: Madilyn Hook, DO;  Location: [facility] ORS;  Service: General;;  . Fatima Blank HERNIA REPAIR  06/26/2012   Procedure: HERNIA REPAIR INCISIONAL;  Surgeon: Madilyn Hook, DO;  Location: [facility] ORS;  Service: General;;  . JEJUNOSTOMY FEEDING TUBE     07-09-15 has been removed.  Marland Kitchen knee arthroscopic surgery   1992  . MASS EXCISION Left 04/04/2013   Procedure: EXCISION OF SCALP MASS;  Surgeon: Madilyn Hook, DO;  Location: [facility] ORS;  Service: General;  Laterality: Left;  . SPINE BIOPSY  about 4 weeks ago   "along with fluid", could not reach mass  . TONSILLECTOMY  as child   Social History   Social History  . Marital status: Married    Spouse name: N/A  . Number of children: N/A  . Years of education: N/A   Social History Main Topics  . Smoking status: Never Smoker  . Smokeless tobacco: Never Used  . Alcohol use Yes     Comment: socially  . Drug use: No  . Sexual activity: Not Asked   Other Topics Concern  . None   Social History Narrative  . None   Family History  Problem Relation Age of Onset  . Cancer Father     kidney   No Known Allergies Prior to Admission medications   Medication Sig Start Date End Date Taking? Authorizing Provider  amLODipine (NORVASC) 5 MG tablet Take 5 mg by mouth daily. 06/13/15  Yes Historical Provider, MD  cyclobenzaprine (FLEXERIL) 10 MG tablet Take 10 mg by mouth at bedtime as needed for muscle spasms.  03/22/16  Yes Historical Provider, MD  dofetilide (TIKOSYN) 500 MCG capsule  Take 500 mcg by mouth 2 (two) times daily. 04/27/15  Yes Historical Provider, MD  FLUoxetine (PROZAC) 20 MG tablet Take 20 mg by mouth every morning.  02/23/12  Yes Historical Provider, MD  gabapentin (NEURONTIN) 300 MG capsule Take 300 mg by mouth 3 (three) times daily. Pt will take 300mg  during the day for pain, and then may take 600mg  as needed at night for pain 02/26/16  Yes Historical Provider, MD  HYDROcodone-acetaminophen (NORCO/VICODIN) 5-325 MG tablet Take 1-2 tablets by mouth every 6 (six) hours as needed for moderate pain. (pain) 02/23/16  Yes Historical Provider, MD  LANTUS 100 UNIT/ML injection Inject 14 Units into the skin every morning. 03/07/16  Yes Historical Provider, MD  lisinopril (PRINIVIL,ZESTRIL) 20 MG tablet Take 20 mg by mouth daily. 06/13/15  Yes Historical  Provider, MD  LORazepam (ATIVAN) 1 MG tablet Take 1 mg by mouth at bedtime.   Yes Historical Provider, MD  metoprolol tartrate (LOPRESSOR) 25 MG tablet Take 1 tablet (25 mg total) by mouth 2 (two) times daily. 03/24/16  Yes Belva Crome, MD  ranitidine (ZANTAC) 150 MG tablet Take 150 mg by mouth 2 (two) times daily.   Yes Historical Provider, MD  rivaroxaban (XARELTO) 20 MG TABS tablet Take 1 tablet (20 mg total) by mouth daily. 05/26/14  Yes Belva Crome, MD  traMADol (ULTRAM) 50 MG tablet Take 50 mg by mouth daily as needed for moderate pain.   Yes Historical Provider, MD  vitamin B-12 (CYANOCOBALAMIN) 1000 MCG tablet Take 1,000 mcg by mouth every other day.   Yes Historical Provider, MD     All other systems have been reviewed and were otherwise negative with the exception of those mentioned in the HPI and as above.  Physical Exam: Vitals:   06/02/16 0549  BP: (!) 155/117  Pulse: 86  Resp: 18  Temp: 97.2 F (36.2 C)    General: Alert, no acute distress Cardiovascular: No pedal edema Respiratory: No cyanosis, no use of accessory musculature Skin: No lesions in the area of chief complaint Neurologic: Sensation intact distally Psychiatric: Patient is competent for consent with normal mood and affect Lymphatic: No axillary or cervical lymphadenopathy  MUSCULOSKELETAL: R spurling's on the right  Assessment/Plan: Right arm pain  Plan for Procedure(s): ANTERIOR CERVICAL DECOMPRESSION FUSION CERVICAL 5-6, CERVICAL 6-7 WITH INSTRUMENTATION AND ALLOGRAFT. Of note the patient had an extensive esophageal surgery as noted above. If significant adhesions int he region of the esophagus are encountered, this may preclude proceeding safely, and the surgery may need to be aborted. Patient is aware of this.    Sinclair Ship, MD 06/02/2016 7:00 AM

## 2016-06-02 NOTE — Transfer of Care (Signed)
Immediate Anesthesia Transfer of Care Note  Patient: Lawrence [name].  Procedure(s) Performed: Procedure(s) with comments: ANTERIOR CERVICAL DECOMPRESSION FUSION CERVICAL 5-6, CERVICAL 6-7 WITH INSTRUMENTATION AND ALLOGRAFT (N/A) - ANTERIOR CERVICAL DECOMPRESSION FUSION CERVICAL 5-6, CERVICAL 6-7 WITH INSTRUMENTATION AND ALLOGRAFT  Patient Location: PACU  Anesthesia Type:General  Level of Consciousness: awake, alert , oriented and patient cooperative  Airway & Oxygen Therapy: Patient Spontanous Breathing and Patient connected to nasal cannula oxygen  Post-op Assessment: Report given to RN and Post -op Vital signs reviewed and stable  Post vital signs: Reviewed and stable  Last Vitals:  Vitals:   06/02/16 0549 06/02/16 1140  BP: (!) 155/117 (!) 142/73  Pulse: 86 91  Resp: 18 14  Temp: 36.2 C 36.7 C    Last Pain:  Vitals:   06/02/16 0650  TempSrc:   PainSc: 7       Patients Stated Pain Goal: 4 ([id number])  Complications: No apparent anesthesia complications

## 2016-06-02 NOTE — Anesthesia Preprocedure Evaluation (Addendum)
Anesthesia Evaluation  Patient identified by MRN, date of birth, ID band Patient awake    Reviewed: Allergy & Precautions, NPO status , Patient's Chart, lab work & pertinent test results  History of Anesthesia Complications Negative for: history of anesthetic complications  Airway Mallampati: II  TM Distance: >3 FB Neck ROM: Full    Dental  (+) Teeth Intact   Pulmonary neg pulmonary ROS,    breath sounds clear to auscultation       Cardiovascular hypertension, + dysrhythmias Atrial Fibrillation + pacemaker  Rhythm:Irregular Rate:Normal     Neuro/Psych TIA   GI/Hepatic Neg liver ROS, hiatal hernia, GERD  ,  Endo/Other  diabetesMorbid obesity  Renal/GU negative Renal ROS     Musculoskeletal   Abdominal (+) + obese,   Peds  Hematology   Anesthesia Other Findings   Reproductive/Obstetrics                            Anesthesia Physical Anesthesia Plan  ASA: III  Anesthesia Plan: General   Post-op Pain Management:    Induction: Intravenous  Airway Management Planned: Oral ETT  Additional Equipment:   Intra-op Plan:   Post-operative Plan:   Informed Consent: I have reviewed the patients History and Physical, chart, labs and discussed the procedure including the risks, benefits and alternatives for the proposed anesthesia with the patient or authorized representative who has indicated his/her understanding and acceptance.     Plan Discussed with: CRNA  Anesthesia Plan Comments:         Anesthesia Quick Evaluation

## 2016-06-02 NOTE — Anesthesia Procedure Notes (Signed)
Procedure Name: Intubation Date/Time: 06/02/2016 7:38 AM Performed by: Lance Coon Pre-anesthesia Checklist: Emergency Drugs available, Patient identified, Suction available, Patient being monitored and Timeout performed Patient Re-evaluated:Patient Re-evaluated prior to inductionOxygen Delivery Method: Circle system utilized Preoxygenation: Pre-oxygenation with 100% oxygen Intubation Type: IV induction Ventilation: Mask ventilation without difficulty Laryngoscope Size: Miller and 2 Grade View: Grade I Tube size: 7.5 mm Number of attempts: 1 Airway Equipment and Method: Stylet Placement Confirmation: ETT inserted through vocal cords under direct vision,  positive ETCO2 and breath sounds checked- equal and bilateral Secured at: 23 cm Tube secured with: Tape Dental Injury: Teeth and Oropharynx as per pre-operative assessment

## 2016-06-02 NOTE — Progress Notes (Signed)
Orthopedic Tech Progress Note Patient Details:  Lawrence [name] 12-02-1950 [id number]  Ortho Devices Type of Ortho Device: [place] cervical collar Ortho Device/Splint Location: at bedside Ortho Device/Splint Interventions: Criss Alvine 06/02/2016, 1:32 PM

## 2016-06-03 DIAGNOSIS — M4802 Spinal stenosis, cervical region: Secondary | ICD-10-CM | POA: Diagnosis not present

## 2016-06-03 LAB — GLUCOSE, CAPILLARY
Glucose-Capillary: 136 mg/dL — ABNORMAL HIGH (ref 65–99)
Glucose-Capillary: 125 mg/dL — ABNORMAL HIGH (ref 65–99)

## 2016-06-03 MED ORDER — FLUOXETINE HCL 20 MG PO CAPS
20.0000 mg | ORAL_CAPSULE | Freq: Every day | ORAL | Status: DC
  Administered 2016-06-03: 20 mg via ORAL
  Filled 2016-06-03: qty 1

## 2016-06-03 MED FILL — Thrombin For Soln 20000 Unit: CUTANEOUS | Qty: 1 | Status: AC

## 2016-06-03 NOTE — Progress Notes (Signed)
Patient alert and oriented, mae's well, voiding adequate amount of urine, swallowing without difficulty, c/o moderate pain and meds given prior to discharged for ride and discomfort. Patient discharged home with family. Script and discharged instructions given to patient. Patient and family stated understanding of instructions given.   

## 2016-06-03 NOTE — Progress Notes (Signed)
    Patient doing well, right arm pain resolved, neck/trapezial discomfort not unexpected. Denies swallowing difficulties, mild soreness in throat, eager to progress home, minimal pain well controlled    Physical Exam: Vitals:   06/02/16 2330 06/03/16 0340  BP: (!) 132/99 113/82  Pulse: 91 70  Resp: 20 20  Temp: 98.3 F (36.8 C) 97.6 F (36.4 C)    Dressing in place, clean and dry, hard collar riding a bit high and loose, re-adjusted to be worn appropriately, neck soft and supple NVI  POD #1 s/p C5-7 ACDF with resolved pre-op right arm pain,minimal PO neck pain   - encourage ambulation - Percocet for pain, Valium for muscle spasms  -Scripts signed in chart for D/C - likely d/c home today   -D/C instructions printed and in chart

## 2016-06-03 NOTE — Anesthesia Postprocedure Evaluation (Signed)
Anesthesia Post Note  Patient: Athan Ulep.  Procedure(s) Performed: Procedure(s) (LRB): ANTERIOR CERVICAL DECOMPRESSION FUSION CERVICAL 5-6, CERVICAL 6-7 WITH INSTRUMENTATION AND ALLOGRAFT (N/A)  Patient location during evaluation: PACU Anesthesia Type: General Level of consciousness: awake and alert Pain management: pain level controlled Vital Signs Assessment: post-procedure vital signs reviewed and stable Respiratory status: spontaneous breathing, nonlabored ventilation, respiratory function stable and patient connected to nasal cannula oxygen Cardiovascular status: blood pressure returned to baseline and stable Postop Assessment: no signs of nausea or vomiting Anesthetic complications: no    Last Vitals:  Vitals:   06/03/16 0340 06/03/16 0754  BP: 113/82 (!) 130/99  Pulse: 70 88  Resp: 20 18  Temp: 36.4 C 36.8 C    Last Pain:  Vitals:   06/03/16 0627  TempSrc:   PainSc: 6                  Yaira Bernardi,JAMES TERRILL

## 2016-06-06 ENCOUNTER — Encounter (HOSPITAL_COMMUNITY): Payer: Self-pay | Admitting: Orthopedic Surgery

## 2016-06-15 NOTE — Discharge Summary (Signed)
Patient ID: [name] MRN: [id number] DOB/AGE: [birth year] 64 y.o.  Admit date: 06/02/2016 Discharge date: 06/03/2016  Admission Diagnoses:  Active Problems:   Radiculopathy   Discharge Diagnoses:  Same  Past Medical History:  Diagnosis Date  . Atrial fibrillation ([place])   . Cancer [facility]) 0ct. 22 2013   esophageal Dr. Aurelio Jew thoracic follows  . Diabetes mellitus   . Dysrhythmia    a.fib  . Esophageal cancer ([place])   . GERD (gastroesophageal reflux disease)    occasional before j tube  . H/O hiatal hernia    h/o  . Hyperlipidemia    hx of, "weight loss took care of it"  . Hypertension    weight loss lowered it  . Long term (current) use of anticoagulants   . Morbid obesity ([place])   . Presence of permanent cardiac pacemaker    Dr.Daubert -[facility] follows  . TIA (transient ischemic attack)    pt. denies this 07-09-15    Surgeries: Procedure(s): ANTERIOR CERVICAL DECOMPRESSION FUSION CERVICAL 5-6, CERVICAL 6-7 WITH INSTRUMENTATION AND ALLOGRAFT on 06/02/2016   Consultants: None  Discharged Condition: Improved  Hospital Course: Yonathan Demke. is an 65 y.o. male who was admitted 06/02/2016 for operative treatment of radiculopathy. Patient has severe unremitting pain that affects sleep, daily activities, and work/hobbies. After pre-op clearance the patient was taken to the operating room on 06/02/2016 and underwent  Procedure(s): ANTERIOR CERVICAL DECOMPRESSION FUSION CERVICAL 5-6, CERVICAL 6-7 WITH INSTRUMENTATION AND ALLOGRAFT.    Patient was given perioperative antibiotics:  Anti-infectives    Start     Dose/Rate Route Frequency Ordered Stop   06/02/16 1400  ceFAZolin (ANCEF) IVPB 1 g/50 mL premix     1 g 100 mL/hr over 30 Minutes Intravenous Every 8 hours 06/02/16 1319 06/02/16 2101   06/02/16 0635  ceFAZolin (ANCEF) IVPB 2g/100 mL premix     2 g 200 mL/hr over 30 Minutes Intravenous On call to O.R. 06/02/16 [id number] 06/02/16 0750        Patient was given sequential compression devices, early ambulation to prevent DVT.  Patient benefited maximally from hospital stay and there were no complications.    Recent vital signs: BP (!) 130/99   Pulse 88   Temp 98.2 F (36.8 C)   Resp 18   Wt 108 kg (238 lb)   SpO2 95%   BMI 31.40 kg/m   Discharge Medications:     Medication List    TAKE these medications   amLODipine 5 MG tablet Commonly known as:  NORVASC Take 5 mg by mouth daily.   dofetilide 500 MCG capsule Commonly known as:  TIKOSYN Take 500 mcg by mouth 2 (two) times daily.   FLUoxetine 20 MG tablet Commonly known as:  PROZAC Take 20 mg by mouth every morning.   gabapentin 300 MG capsule Commonly known as:  NEURONTIN Take 300 mg by mouth 3 (three) times daily. Pt will take 300mg  during the day for pain, and then may take 600mg  as needed at night for pain   LANTUS 100 UNIT/ML injection Generic drug:  insulin glargine Inject 14 Units into the skin every morning.   lisinopril 20 MG tablet Commonly known as:  PRINIVIL,ZESTRIL Take 20 mg by mouth daily.   LORazepam 1 MG tablet Commonly known as:  ATIVAN Take 1 mg by mouth at bedtime.   metoprolol tartrate 25 MG tablet Commonly known as:  LOPRESSOR Take 1 tablet (25 mg total) by mouth 2 (  two) times daily.   ranitidine 150 MG tablet Commonly known as:  ZANTAC Take 150 mg by mouth 2 (two) times daily.   rivaroxaban 20 MG Tabs tablet Commonly known as:  XARELTO Take 1 tablet (20 mg total) by mouth daily. Resume taking on Monday, 10/2 What changed:  additional instructions   vitamin B-12 1000 MCG tablet Commonly known as:  CYANOCOBALAMIN Take 1,000 mcg by mouth every other day.       Diagnostic Studies: Dg Cervical Spine 2-3 Views  Result Date: 06/02/2016 CLINICAL DATA:  Cervical spine fusion. EXAM: CERVICAL SPINE - 2-3 VIEW; DG C-ARM 61-120 MIN COMPARISON:  MRI 03/17/2016 FINDINGS: Anterior and interbody cervical spine fusion C5-C7. No  acute bony abnormality. Normal alignment. IMPRESSION: Anterior and interbody cervical fusion C5 through C7 cervical spine fusion. No acute abnormality. Electronically Signed   By: Marcello Moores  Register   On: 06/02/2016 11:33   Dg C-arm 1-60 Min  Result Date: 06/02/2016 CLINICAL DATA:  Cervical spine fusion. EXAM: CERVICAL SPINE - 2-3 VIEW; DG C-ARM 61-120 MIN COMPARISON:  MRI 03/17/2016 FINDINGS: Anterior and interbody cervical spine fusion C5-C7. No acute bony abnormality. Normal alignment. IMPRESSION: Anterior and interbody cervical fusion C5 through C7 cervical spine fusion. No acute abnormality. Electronically Signed   By: Marcello Moores  Register   On: 06/02/2016 11:33    Disposition: 01-Home or Self Care   POD #1 s/p C5-7 ACDF with resolved pre-op right arm pain,minimal PO neck pain   - encourage ambulation - Percocet for pain, Valium for muscle spasms             -Scripts signed in chart for D/C -D/C instructions sheet printed and in chart -D/C today  -F/U in office 2 weeks   Signed: Justice Britain 06/15/2016, 1:00 PM

## 2016-06-20 ENCOUNTER — Ambulatory Visit (INDEPENDENT_AMBULATORY_CARE_PROVIDER_SITE_OTHER): Payer: BLUE CROSS/BLUE SHIELD | Admitting: Interventional Cardiology

## 2016-06-20 ENCOUNTER — Encounter: Payer: Self-pay | Admitting: Interventional Cardiology

## 2016-06-20 VITALS — BP 154/92 | HR 69 | Ht 73.0 in | Wt 231.8 lb

## 2016-06-20 DIAGNOSIS — I10 Essential (primary) hypertension: Secondary | ICD-10-CM

## 2016-06-20 DIAGNOSIS — I480 Paroxysmal atrial fibrillation: Secondary | ICD-10-CM

## 2016-06-20 DIAGNOSIS — Z7901 Long term (current) use of anticoagulants: Secondary | ICD-10-CM

## 2016-06-20 DIAGNOSIS — Z79899 Other long term (current) drug therapy: Secondary | ICD-10-CM | POA: Diagnosis not present

## 2016-06-20 DIAGNOSIS — G458 Other transient cerebral ischemic attacks and related syndromes: Secondary | ICD-10-CM | POA: Diagnosis not present

## 2016-06-20 DIAGNOSIS — Z950 Presence of cardiac pacemaker: Secondary | ICD-10-CM

## 2016-06-20 NOTE — Patient Instructions (Signed)
Medication Instructions:  Your physician recommends that you continue on your current medications as directed. Please refer to the Current Medication list given to you today.   Labwork: none  Testing/Procedures: none  Follow-Up: Your physician wants you to follow-up in: 6-9 months.  You will receive a reminder letter in the mail two months in advance. If you don't receive a letter, please call our office to schedule the follow-up appointment.   Any Other Special Instructions Will Be Listed Below (If Applicable).     If you need a refill on your cardiac medications before your next appointment, please call your pharmacy.

## 2016-06-20 NOTE — Progress Notes (Signed)
Cardiology Office Note    Date:  06/20/2016   ID:  Lawrence Fudge., DOB 1951/06/11, MRN [id number]  PCP:  Irven Shelling, MD  Cardiologist: [name], MD   Chief Complaint  Patient presents with  . Atrial Fibrillation    History of Present Illness:  Lawrence Heimberger. is a 65 y.o. male  male for f/u  atrial fibrillation (Dr.Daubert at [facility]) , dofetilide therapy, chronic anticoagulation, hypertension, TIA, and hyperlipidemia.. Prior history of esophagectomy for esophageal cancer 2013.  He underwent an anterior cervical decompression fusion C5-6 and C6-7 with allograft. No significant cardiac issues. Since that time has been hoarse and having difficulty swallowing.  He is seen Dr. Laurice Record. Daubert M.D. at [facility] and the plan is atrial fibrillation ablation later this fall to aggressively attempt to control rhythm. He is continuing on dofetilide. Pacer and electrical problems are being followed at [facility].   Past Medical History:  Diagnosis Date  . Atrial fibrillation ([place])   . Cancer [facility]) 0ct. 22 2013   esophageal Dr. Aurelio Jew thoracic follows  . Diabetes mellitus   . Dysrhythmia    a.fib  . Esophageal cancer ([place])   . GERD (gastroesophageal reflux disease)    occasional before j tube  . H/O hiatal hernia    h/o  . Hyperlipidemia    hx of, "weight loss took care of it"  . Hypertension    weight loss lowered it  . Long term (current) use of anticoagulants   . Morbid obesity ([place])   . Presence of permanent cardiac pacemaker    Dr.Daubert -[facility] follows  . TIA (transient ischemic attack)    pt. denies this 07-09-15    Past Surgical History:  Procedure Laterality Date  . ANTERIOR CERVICAL DECOMP/DISCECTOMY FUSION N/A 06/02/2016   Procedure: ANTERIOR CERVICAL DECOMPRESSION FUSION CERVICAL 5-6, CERVICAL 6-7 WITH INSTRUMENTATION AND ALLOGRAFT;  Surgeon: Phylliss Bob,  MD;  Location: [facility];  Service: Orthopedics;  Laterality: N/A;  ANTERIOR CERVICAL DECOMPRESSION FUSION CERVICAL 5-6, CERVICAL 6-7 WITH INSTRUMENTATION AND ALLOGRAFT  . BIOPSY  06/26/2012   Procedure: BIOPSY;  Surgeon: Madilyn Hook, DO;  Location: [facility];  Service: General;;  . CHOLECYSTECTOMY    . COLONOSCOPY WITH PROPOFOL N/A 07/13/2015   Procedure: COLONOSCOPY WITH PROPOFOL;  Surgeon: Garlan Fair, MD;  Location: [facility];  Service: Endoscopy;  Laterality: N/A;  . ESOPHAGECTOMY    . ESOPHAGOGASTRODUODENOSCOPY ENDOSCOPY     with stretching every 2 weeks  . HERNIA REPAIR     2x in groin  . HIATAL HERNIA REPAIR  06/26/2012   Procedure: LAPAROSCOPIC REPAIR OF HIATAL HERNIA;  Surgeon: Madilyn Hook, DO;  Location: [facility];  Service: General;;  . Fatima Blank HERNIA REPAIR  06/26/2012   Procedure: HERNIA REPAIR INCISIONAL;  Surgeon: Madilyn Hook, DO;  Location: [facility];  Service: General;;  . JEJUNOSTOMY FEEDING TUBE     07-09-15 has been removed.  Marland Kitchen knee arthroscopic surgery  1992  . MASS EXCISION Left 04/04/2013   Procedure: EXCISION OF SCALP MASS;  Surgeon: Madilyn Hook, DO;  Location: [facility];  Service: General;  Laterality: Left;  . SPINE BIOPSY  about 4 weeks ago   "along with fluid", could not reach mass  . TONSILLECTOMY  as child    Current Medications: Outpatient Medications Prior to Visit  Medication Sig Dispense Refill  . amLODipine (NORVASC) 5 MG tablet Take 5 mg by mouth  daily.  11  . dofetilide (TIKOSYN) 500 MCG capsule Take 500 mcg by mouth 2 (two) times daily.    Marland Kitchen FLUoxetine (PROZAC) 20 MG tablet Take 20 mg by mouth every morning.     Marland Kitchen LANTUS 100 UNIT/ML injection Inject 14 Units into the skin every morning.  3  . lisinopril (PRINIVIL,ZESTRIL) 20 MG tablet Take 20 mg by mouth daily.  3  . LORazepam (ATIVAN) 1 MG tablet Take 1 mg by mouth at bedtime.    . metoprolol tartrate (LOPRESSOR) 25 MG tablet Take 1 tablet (25 mg total) by mouth 2 (two) times daily. 60 tablet 11    . ranitidine (ZANTAC) 150 MG tablet Take 150 mg by mouth 2 (two) times daily.    . rivaroxaban (XARELTO) 20 MG TABS tablet Take 1 tablet (20 mg total) by mouth daily. Resume taking on Monday, 10/2 30 tablet 6  . vitamin B-12 (CYANOCOBALAMIN) 1000 MCG tablet Take 1,000 mcg by mouth every other day.    . gabapentin (NEURONTIN) 300 MG capsule Take 300 mg by mouth 3 (three) times daily. Pt will take 300mg  during the day for pain, and then may take 600mg  as needed at night for pain  0   No facility-administered medications prior to visit.      Allergies:   Review of patient's allergies indicates no known allergies.   Social History   Social History  . Marital status: Married    Spouse name: N/A  . Number of children: N/A  . Years of education: N/A   Social History Main Topics  . Smoking status: Never Smoker  . Smokeless tobacco: Never Used  . Alcohol use Yes     Comment: socially  . Drug use: No  . Sexual activity: Not Asked   Other Topics Concern  . None   Social History Narrative  . None     Family History:  The patient's family history includes Cancer in his father.   ROS:   Please see the history of present illness.    Unfortunate development of 4 status and difficulty swallowing since anterior cervical fusion.  All other systems reviewed and are negative.   PHYSICAL EXAM:   VS:  BP (!) 154/92   Pulse 69   Ht 6\' 1"  (1.854 m)   Wt 231 lb 12.8 oz (105.1 kg)   BMI 30.58 kg/m    GEN: Well nourished, well developed, in no acute distress  HEENT: normal  Neck: no JVD, carotid bruits, or masses Cardiac: RRR; no murmurs, rubs, or gallops,no edema  Respiratory:  clear to auscultation bilaterally, normal work of breathing GI: soft, nontender, nondistended, + BS MS: no deformity or atrophy  Skin: warm and dry, no rash Neuro:  Alert and Oriented x 3, Strength and sensation are intact Psych: euthymic mood, full affect  Wt Readings from Last 3 Encounters:  06/20/16 231  lb 12.8 oz (105.1 kg)  06/02/16 238 lb (108 kg)  05/26/16 238 lb 4 oz (108.1 kg)      Studies/Labs Reviewed:   EKG:  EKG  none  Recent Labs: 05/26/2016: ALT 18; BUN 14; Creatinine, Ser 1.02; Hemoglobin 15.0; Magnesium 2.0; Platelets 193; Potassium 5.2; Sodium 137   Lipid Panel    Component Value Date/Time   CHOL 157 04/13/2012 0825   TRIG 103 04/13/2012 0825   HDL 40 04/13/2012 0825   CHOLHDL 3.9 04/13/2012 0825   VLDL 21 04/13/2012 0825   LDLCALC 96 04/13/2012 0825    Additional studies/  records that were reviewed today include:  None    ASSESSMENT:    1. Essential (primary) hypertension   2. Paroxysmal atrial fibrillation (HCC)   3. Other specified transient cerebral ischemias   4. On dofetilide therapy   5. Morbid obesity ([place])   6. Chronic anticoagulation   7. DDD Pacemaker      PLAN:  In order of problems listed above:  1. There is mild blood pressure elevation today. Target is 140/90. Low salt diet is recommended. 2. The current plan is to continue dofetilide. Ablation will be done in several weeks. He needs to be one month out with continuous therapeutic anticoagulation following cervical fusion for the procedure can be done. 3. Asymptomatic. Clinically is in sinus rhythm today. 4. Not addressed 5. Continue dofetilide. 6. Continue Xarelto. Ablation when for continuous weeks of anticoagulation is documented. 7. Followed at [facility].    Medication Adjustments/Labs and Tests Ordered: Current medicines are reviewed at length with the patient today.  Concerns regarding medicines are outlined above.  Medication changes, Labs and Tests ordered today are listed in the Patient Instructions below. Patient Instructions  Medication Instructions:  Your physician recommends that you continue on your current medications as directed. Please refer to the Current Medication list given to you  today.   Labwork: none  Testing/Procedures: none  Follow-Up: Your physician wants you to follow-up in: 6-9 months.  You will receive a reminder letter in the mail two months in advance. If you don't receive a letter, please call our office to schedule the follow-up appointment.   Any Other Special Instructions Will Be Listed Below (If Applicable).     If you need a refill on your cardiac medications before your next appointment, please call your pharmacy.      Signed, [name], MD  06/20/2016 11:38 AM    [facility] [address], [place], [place]  [address] Phone: [phone]; Fax: [phone]

## 2016-08-07 ENCOUNTER — Encounter: Payer: Self-pay | Admitting: Interventional Cardiology

## 2016-08-07 DIAGNOSIS — Z9889 Other specified postprocedural states: Secondary | ICD-10-CM

## 2016-08-07 DIAGNOSIS — Z8679 Personal history of other diseases of the circulatory system: Secondary | ICD-10-CM | POA: Insufficient documentation

## 2016-08-10 ENCOUNTER — Encounter: Payer: Self-pay | Admitting: Interventional Cardiology

## 2016-08-17 ENCOUNTER — Other Ambulatory Visit: Payer: Self-pay | Admitting: *Deleted

## 2016-08-17 MED ORDER — METOPROLOL TARTRATE 25 MG PO TABS: 25.0000 mg | ORAL_TABLET | Freq: Two times a day (BID) | ORAL | 3 refills | Status: DC

## 2016-11-09 ENCOUNTER — Other Ambulatory Visit: Payer: Self-pay | Admitting: Nurse Practitioner

## 2016-11-09 ENCOUNTER — Ambulatory Visit
Admission: RE | Admit: 2016-11-09 | Discharge: 2016-11-09 | Disposition: A | Discharge status: Home / Self Care | Payer: BLUE CROSS/BLUE SHIELD | Source: Ambulatory Visit | Attending: Nurse Practitioner | Admitting: Nurse Practitioner

## 2016-11-09 DIAGNOSIS — R059 Cough, unspecified: Secondary | ICD-10-CM

## 2016-11-09 DIAGNOSIS — R05 Cough: Secondary | ICD-10-CM

## 2016-11-16 ENCOUNTER — Telehealth: Payer: Self-pay | Admitting: Interventional Cardiology

## 2016-11-16 NOTE — Telephone Encounter (Signed)
Cleared for upcoming surgery. Okay to hold the Xarelto for 48 hours.

## 2016-11-16 NOTE — Telephone Encounter (Signed)
New Message    Request for surgical clearance:  1. What type of surgery is being performed? Penial curvature procedure   2. When is this surgery scheduled? Not yet   3. Are there any medications that need to be held prior to surgery and how long? Xarelto 48 hours  4. Name of physician performing surgery? Dr Karsten Ro   5. What is your office phone and fax number? Fax [phone]    Is patient ok to have procedure done, and can he hold medication?

## 2016-11-17 NOTE — Telephone Encounter (Signed)
Faxed to requesting office. 

## 2016-11-21 ENCOUNTER — Other Ambulatory Visit: Payer: Self-pay | Admitting: Urology

## 2016-11-28 ENCOUNTER — Telehealth: Payer: Self-pay | Admitting: Interventional Cardiology

## 2016-11-28 MED ORDER — RIVAROXABAN 20 MG PO TABS: 20.0000 mg | ORAL_TABLET | Freq: Every day | ORAL | 1 refills | Status: DC

## 2016-11-28 NOTE — Telephone Encounter (Signed)
Rx refill sent to get patient through until visit with Dr. Tamala Julian.

## 2016-11-28 NOTE — Telephone Encounter (Signed)
NEW MESSAGE   *STAT* If patient is at the pharmacy, call can be transferred to refill team.   1. Which medications need to be refilled? (please list name of each medication and dose if known)   2. Which pharmacy/location (including street and city if local pharmacy) is medication to be sent to?  3. Do they need a 30 day or 90 day supply?   PT WALKED IN SAYING NEEDS REFILL ON XARELTO 20MG  (PREFERABLY THE GENERIC RIVAROZABAN). RUNS OUT Thursday SO PT WANTED TO KNOW IF HE COULD GET A RX REFILL SENT TO [facility] ON [address] ENOUGH TO LAST TILL HIS APPT WITH DR Tamala Julian WHEN HE CAN TALK TO HIM ABOUT THIS MEDICATION... PLEASE CALL THE PT TO DISCUSS IF ANY QUESTIONS OR THIS DOESN'T MAKE SENSE. AND TO LET THE PT KNOW IF RX WAS SENT TO [facility].

## 2016-11-29 ENCOUNTER — Telehealth: Payer: Self-pay

## 2016-11-29 ENCOUNTER — Telehealth: Payer: Self-pay | Admitting: Pharmacist

## 2016-11-29 ENCOUNTER — Other Ambulatory Visit: Payer: Self-pay | Admitting: Pharmacist

## 2016-11-29 MED ORDER — APIXABAN 5 MG PO TABS: 5.0000 mg | ORAL_TABLET | Freq: Two times a day (BID) | ORAL | 0 refills | Status: DC

## 2016-11-29 NOTE — Telephone Encounter (Signed)
Prior auth submitted online to [facility] for Xarelto 20mg . It was promptly denied. They said it had already been submitted and denied. Megan, our Pharm D is calling the patient.

## 2016-11-29 NOTE — Telephone Encounter (Addendum)
Spoke with Vaughan Basta - PA was previously submitted and denied because Xarelto is apparently not covered on [facility]' formulary. Called and LMOM for pt - will need to verify start date for his Medicare insurance next month and likely sample pt until then.

## 2016-11-29 NOTE — Telephone Encounter (Signed)
Pt came into clinic for samples of Xarelto - a prescription refill was sent in but pt's pharmacy told him that a prior authorization was needed with [facility]. Pt given Xarelto samples for 10 days. Will route to [place] to ensure that Xarelto PA is completed. Pt will call clinic if pharmacy still unable to refill Xarelto when his samples run out.

## 2016-11-29 NOTE — Telephone Encounter (Signed)
Spoke with pt - placed samples of Xarelto out front until Medicare Part D coverage starts 01/03/17.

## 2016-11-30 ENCOUNTER — Encounter (HOSPITAL_BASED_OUTPATIENT_CLINIC_OR_DEPARTMENT_OTHER): Payer: Self-pay | Admitting: *Deleted

## 2016-12-01 ENCOUNTER — Encounter (HOSPITAL_BASED_OUTPATIENT_CLINIC_OR_DEPARTMENT_OTHER): Payer: Self-pay | Admitting: *Deleted

## 2016-12-01 NOTE — Progress Notes (Addendum)
NPO AFTER MN.  ARRIVE AT 0600.  NEEDS ISTAT 8.  CURRENT EKG AND CXR IN CHART AND EPIC.  WILL TAKE AM MEDS DOS W/ SIPS OF WATER WITH EXCEPTION NO LISINOPRIL/ LANTUS INSULIN.  PT VERBALIZED UNDERSTANDING TO STOP XARELTO 48 HOURS PRIOR TO DOS,  OK'S BY DR Daneen Schick (PT'S CARDIOLOGIST).   ADDENDUM:  REVIEWED CHART W/ DR JOSLIN MDA,  OK TO PROCEED.

## 2016-12-09 ENCOUNTER — Encounter (HOSPITAL_BASED_OUTPATIENT_CLINIC_OR_DEPARTMENT_OTHER)
Admission: RE | Disposition: A | Discharge status: Home / Self Care | Payer: Self-pay | Source: Ambulatory Visit | Attending: Urology

## 2016-12-09 ENCOUNTER — Ambulatory Visit (HOSPITAL_BASED_OUTPATIENT_CLINIC_OR_DEPARTMENT_OTHER): Payer: Medicare Other | Admitting: Anesthesiology

## 2016-12-09 ENCOUNTER — Encounter (HOSPITAL_BASED_OUTPATIENT_CLINIC_OR_DEPARTMENT_OTHER): Payer: Self-pay

## 2016-12-09 ENCOUNTER — Ambulatory Visit (HOSPITAL_BASED_OUTPATIENT_CLINIC_OR_DEPARTMENT_OTHER)
Admission: RE | Admit: 2016-12-09 | Discharge: 2016-12-09 | Disposition: A | Discharge status: Home / Self Care | Payer: Medicare Other | Source: Ambulatory Visit | Attending: Urology | Admitting: Urology

## 2016-12-09 DIAGNOSIS — Q5561 Curvature of penis (lateral): Secondary | ICD-10-CM | POA: Diagnosis not present

## 2016-12-09 DIAGNOSIS — Z950 Presence of cardiac pacemaker: Secondary | ICD-10-CM | POA: Insufficient documentation

## 2016-12-09 DIAGNOSIS — Z7901 Long term (current) use of anticoagulants: Secondary | ICD-10-CM | POA: Insufficient documentation

## 2016-12-09 DIAGNOSIS — I10 Essential (primary) hypertension: Secondary | ICD-10-CM | POA: Diagnosis not present

## 2016-12-09 DIAGNOSIS — N486 Induration penis plastica: Secondary | ICD-10-CM | POA: Diagnosis not present

## 2016-12-09 DIAGNOSIS — E119 Type 2 diabetes mellitus without complications: Secondary | ICD-10-CM | POA: Insufficient documentation

## 2016-12-09 DIAGNOSIS — Z794 Long term (current) use of insulin: Secondary | ICD-10-CM | POA: Insufficient documentation

## 2016-12-09 DIAGNOSIS — K219 Gastro-esophageal reflux disease without esophagitis: Secondary | ICD-10-CM | POA: Insufficient documentation

## 2016-12-09 DIAGNOSIS — I4891 Unspecified atrial fibrillation: Secondary | ICD-10-CM | POA: Insufficient documentation

## 2016-12-09 DIAGNOSIS — F329 Major depressive disorder, single episode, unspecified: Secondary | ICD-10-CM | POA: Diagnosis not present

## 2016-12-09 DIAGNOSIS — Z6831 Body mass index (BMI) 31.0-31.9, adult: Secondary | ICD-10-CM | POA: Diagnosis not present

## 2016-12-09 DIAGNOSIS — Z8673 Personal history of transient ischemic attack (TIA), and cerebral infarction without residual deficits: Secondary | ICD-10-CM | POA: Insufficient documentation

## 2016-12-09 DIAGNOSIS — Z79899 Other long term (current) drug therapy: Secondary | ICD-10-CM | POA: Diagnosis not present

## 2016-12-09 HISTORY — DX: Solitary pulmonary nodule: R91.1

## 2016-12-09 HISTORY — DX: Presence of spectacles and contact lenses: Z97.3

## 2016-12-09 HISTORY — DX: Induration penis plastica: N48.6

## 2016-12-09 HISTORY — PX: NESBIT PROCEDURE: SHX2087

## 2016-12-09 HISTORY — DX: Personal history of urinary calculi: Z87.442

## 2016-12-09 HISTORY — DX: Other long term (current) drug therapy: Z79.899

## 2016-12-09 HISTORY — DX: Paroxysmal atrial fibrillation: I48.0

## 2016-12-09 HISTORY — DX: Personal history of malignant neoplasm of esophagus: Z85.01

## 2016-12-09 HISTORY — DX: Type 2 diabetes mellitus without complications: Z79.4

## 2016-12-09 HISTORY — DX: Cyst of pancreas: K86.2

## 2016-12-09 HISTORY — DX: Type 2 diabetes mellitus without complications: E11.9

## 2016-12-09 HISTORY — DX: Personal history of transient ischemic attack (TIA), and cerebral infarction without residual deficits: Z86.73

## 2016-12-09 HISTORY — DX: Acquired absence of other specified parts of digestive tract: Z90.49

## 2016-12-09 HISTORY — DX: Bradycardia, unspecified: R00.1

## 2016-12-09 HISTORY — DX: Other specified postprocedural states: Z98.890

## 2016-12-09 LAB — POCT I-STAT, CHEM 8
BUN: 11 mg/dL (ref 6–20)
Calcium, Ion: 1.24 mmol/L (ref 1.15–1.40)
Chloride: 99 mmol/L — ABNORMAL LOW (ref 101–111)
Creatinine, Ser: 0.9 mg/dL (ref 0.61–1.24)
Glucose, Bld: 166 mg/dL — ABNORMAL HIGH (ref 65–99)
HCT: 42 % (ref 39.0–52.0)
Hemoglobin: 14.3 g/dL (ref 13.0–17.0)
Potassium: 3.9 mmol/L (ref 3.5–5.1)
Sodium: 137 mmol/L (ref 135–145)
TCO2: 27 mmol/L (ref 0–100)

## 2016-12-09 LAB — GLUCOSE, CAPILLARY: Glucose-Capillary: 151 mg/dL — ABNORMAL HIGH (ref 65–99)

## 2016-12-09 SURGERY — NESBIT PROCEDURE
Anesthesia: General

## 2016-12-09 MED ORDER — OXYCODONE HCL 5 MG PO TABS
5.0000 mg | ORAL_TABLET | Freq: Once | ORAL | Status: AC
  Administered 2016-12-09: 5 mg via ORAL
  Filled 2016-12-09: qty 1

## 2016-12-09 MED ORDER — BUPIVACAINE HCL (PF) 0.25 % IJ SOLN
INTRAMUSCULAR | Status: AC
Start: 2016-12-09 — End: 2016-12-09
  Filled 2016-12-09: qty 30

## 2016-12-09 MED ORDER — PROPOFOL 10 MG/ML IV BOLUS
INTRAVENOUS | Status: AC
  Filled 2016-12-09: qty 40

## 2016-12-09 MED ORDER — OXYCODONE HCL 5 MG PO TABS
ORAL_TABLET | ORAL | Status: AC
  Filled 2016-12-09: qty 1

## 2016-12-09 MED ORDER — FENTANYL CITRATE (PF) 100 MCG/2ML IJ SOLN
INTRAMUSCULAR | Status: AC
  Filled 2016-12-09: qty 2

## 2016-12-09 MED ORDER — PAPAVERINE HCL 30 MG/ML IJ SOLN
INTRAMUSCULAR | Status: DC | PRN
  Administered 2016-12-09: 60 mg
  Administered 2016-12-09: 30 mg

## 2016-12-09 MED ORDER — DEXAMETHASONE SODIUM PHOSPHATE 10 MG/ML IJ SOLN
INTRAMUSCULAR | Status: AC
  Filled 2016-12-09: qty 1

## 2016-12-09 MED ORDER — ONDANSETRON HCL 4 MG/2ML IJ SOLN
INTRAMUSCULAR | Status: DC | PRN
  Administered 2016-12-09: 4 mg via INTRAVENOUS

## 2016-12-09 MED ORDER — SODIUM CHLORIDE 0.9 % IJ SOLN
INTRAMUSCULAR | Status: AC
  Filled 2016-12-09: qty 30

## 2016-12-09 MED ORDER — FENTANYL CITRATE (PF) 100 MCG/2ML IJ SOLN
INTRAMUSCULAR | Status: AC
  Filled 2016-12-09: qty 4

## 2016-12-09 MED ORDER — PROPOFOL 10 MG/ML IV BOLUS
INTRAVENOUS | Status: DC | PRN
  Administered 2016-12-09: 200 mg via INTRAVENOUS

## 2016-12-09 MED ORDER — ONDANSETRON HCL 4 MG/2ML IJ SOLN
INTRAMUSCULAR | Status: AC
  Filled 2016-12-09: qty 2

## 2016-12-09 MED ORDER — FENTANYL CITRATE (PF) 100 MCG/2ML IJ SOLN
25.0000 ug | INTRAMUSCULAR | Status: DC | PRN
  Administered 2016-12-09: 50 ug via INTRAVENOUS
  Filled 2016-12-09: qty 1

## 2016-12-09 MED ORDER — LIDOCAINE 2% (20 MG/ML) 5 ML SYRINGE
INTRAMUSCULAR | Status: DC | PRN
  Administered 2016-12-09: 80 mg via INTRAVENOUS

## 2016-12-09 MED ORDER — MIDAZOLAM HCL 2 MG/2ML IJ SOLN
INTRAMUSCULAR | Status: AC
  Filled 2016-12-09: qty 2

## 2016-12-09 MED ORDER — WHITE PETROLATUM GEL
Status: AC
  Filled 2016-12-09: qty 5

## 2016-12-09 MED ORDER — LIDOCAINE 2% (20 MG/ML) 5 ML SYRINGE
INTRAMUSCULAR | Status: AC
  Filled 2016-12-09: qty 5

## 2016-12-09 MED ORDER — PHENYLEPHRINE 40 MCG/ML (10ML) SYRINGE FOR IV PUSH (FOR BLOOD PRESSURE SUPPORT)
PREFILLED_SYRINGE | INTRAVENOUS | Status: AC
  Filled 2016-12-09: qty 10

## 2016-12-09 MED ORDER — SODIUM CHLORIDE 0.9 % IJ SOLN
INTRAMUSCULAR | Status: AC
Start: 2016-12-09 — End: 2016-12-09
  Filled 2016-12-09: qty 50

## 2016-12-09 MED ORDER — MIDAZOLAM HCL 5 MG/5ML IJ SOLN
INTRAMUSCULAR | Status: DC | PRN
  Administered 2016-12-09: 2 mg via INTRAVENOUS

## 2016-12-09 MED ORDER — DEXAMETHASONE SODIUM PHOSPHATE 10 MG/ML IJ SOLN
INTRAMUSCULAR | Status: DC | PRN
  Administered 2016-12-09: 10 mg via INTRAVENOUS

## 2016-12-09 MED ORDER — PHENYLEPHRINE HCL 10 MG/ML IJ SOLN
INTRAMUSCULAR | Status: DC | PRN
  Administered 2016-12-09: 120 ug via INTRAVENOUS
  Administered 2016-12-09: 80 ug via INTRAVENOUS

## 2016-12-09 MED ORDER — FENTANYL CITRATE (PF) 100 MCG/2ML IJ SOLN
INTRAMUSCULAR | Status: DC | PRN
  Administered 2016-12-09: 50 ug via INTRAVENOUS
  Administered 2016-12-09 (×2): 25 ug via INTRAVENOUS

## 2016-12-09 MED ORDER — OXYCODONE-ACETAMINOPHEN 10-325 MG PO TABS: 1.0000 | ORAL_TABLET | ORAL | 0 refills | Status: DC | PRN

## 2016-12-09 MED ORDER — BUPIVACAINE HCL 0.5 % IJ SOLN
INTRAMUSCULAR | Status: DC | PRN
  Administered 2016-12-09: 10 mL

## 2016-12-09 MED ORDER — CEFAZOLIN SODIUM-DEXTROSE 2-4 GM/100ML-% IV SOLN
2.0000 g | INTRAVENOUS | Status: AC
  Administered 2016-12-09: 2 g via INTRAVENOUS
  Filled 2016-12-09: qty 100

## 2016-12-09 MED ORDER — BUPIVACAINE HCL (PF) 0.5 % IJ SOLN
INTRAMUSCULAR | Status: AC
  Filled 2016-12-09: qty 30

## 2016-12-09 MED ORDER — MINERAL OIL LIGHT 100 % EX OIL
TOPICAL_OIL | CUTANEOUS | Status: AC
  Filled 2016-12-09: qty 25

## 2016-12-09 MED ORDER — CEFAZOLIN SODIUM-DEXTROSE 2-4 GM/100ML-% IV SOLN
INTRAVENOUS | Status: AC
  Filled 2016-12-09: qty 100

## 2016-12-09 MED ORDER — PHENYLEPHRINE HCL 10 MG/ML IJ SOLN
INTRAMUSCULAR | Status: DC | PRN
  Administered 2016-12-09: 1000 ug

## 2016-12-09 MED ORDER — BACITRACIN-NEOMYCIN-POLYMYXIN 400-5-5000 EX OINT
TOPICAL_OINTMENT | CUTANEOUS | Status: DC | PRN
  Administered 2016-12-09: 1 via TOPICAL

## 2016-12-09 MED ORDER — BACITRACIN-NEOMYCIN-POLYMYXIN 400-5-5000 EX OINT
TOPICAL_OINTMENT | CUTANEOUS | Status: AC
  Filled 2016-12-09: qty 1

## 2016-12-09 MED ORDER — LACTATED RINGERS IV SOLN
INTRAVENOUS | Status: DC
  Administered 2016-12-09 (×2): via INTRAVENOUS
  Filled 2016-12-09: qty 1000

## 2016-12-09 SURGICAL SUPPLY — 54 items
BANDAGE CO FLEX L/F 2IN X 5YD (GAUZE/BANDAGES/DRESSINGS) ×2 IMPLANT
BLADE CLIPPER SURG (BLADE) ×1 IMPLANT
BLADE SURG 15 STRL LF DISP TIS (BLADE) ×1 IMPLANT
BLADE SURG 15 STRL SS (BLADE) ×2
BNDG CONFORM 2 STRL LF (GAUZE/BANDAGES/DRESSINGS) ×1 IMPLANT
BNDG GAUZE ELAST 4 BULKY (GAUZE/BANDAGES/DRESSINGS) ×1 IMPLANT
CATH FOLEY 2WAY SLVR  5CC 16FR (CATHETERS) ×1
CATH FOLEY 2WAY SLVR 5CC 16FR (CATHETERS) ×1 IMPLANT
CATH ROBINSON RED A/P 8FR (CATHETERS) ×2 IMPLANT
COVER BACK TABLE 60X90IN (DRAPES) ×2 IMPLANT
COVER MAYO STAND STRL (DRAPES) ×2 IMPLANT
DRAIN PENROSE 18X1/4 LTX STRL (WOUND CARE) IMPLANT
DRAPE LAPAROTOMY 100X72 PEDS (DRAPES) ×2 IMPLANT
ELECT NDL TIP 2.8 STRL (NEEDLE) IMPLANT
ELECT NEEDLE TIP 2.8 STRL (NEEDLE) IMPLANT
ELECT REM PT RETURN 9FT ADLT (ELECTROSURGICAL) ×2
ELECTRODE REM PT RTRN 9FT ADLT (ELECTROSURGICAL) ×1 IMPLANT
GAUZE SPONGE 4X4 12PLY STRL (GAUZE/BANDAGES/DRESSINGS) IMPLANT
GAUZE SPONGE 4X4 8PLY STR LF (GAUZE/BANDAGES/DRESSINGS) ×1 IMPLANT
GLOVE BIO SURGEON STRL SZ8 (GLOVE) ×2 IMPLANT
GOWN STRL REUS W/ TWL LRG LVL3 (GOWN DISPOSABLE) ×2 IMPLANT
GOWN STRL REUS W/ TWL XL LVL3 (GOWN DISPOSABLE) ×1 IMPLANT
GOWN STRL REUS W/TWL LRG LVL3 (GOWN DISPOSABLE) ×4
GOWN STRL REUS W/TWL XL LVL3 (GOWN DISPOSABLE) ×2
KIT RM TURNOVER CYSTO AR (KITS) ×2 IMPLANT
LOOP VESSEL MAXI BLUE (MISCELLANEOUS) IMPLANT
NDL HYPO 25X1 1.5 SAFETY (NEEDLE) IMPLANT
NDL HYPO 30X.5 LL (NEEDLE) ×2 IMPLANT
NEEDLE HYPO 25X1 1.5 SAFETY (NEEDLE) IMPLANT
NEEDLE HYPO 30X.5 LL (NEEDLE) ×4 IMPLANT
NS IRRIG 500ML POUR BTL (IV SOLUTION) ×2 IMPLANT
PACK BASIN DAY SURGERY FS (CUSTOM PROCEDURE TRAY) ×2 IMPLANT
PENCIL BUTTON HOLSTER BLD 10FT (ELECTRODE) ×2 IMPLANT
PLUG CATH AND CAP STER (CATHETERS) ×2 IMPLANT
SET IRRIG Y TYPE TUR BLADDER L (SET/KITS/TRAYS/PACK) IMPLANT
SPONGE LAP 4X18 X RAY DECT (DISPOSABLE) ×1 IMPLANT
SUCTION FRAZIER HANDLE 10FR (MISCELLANEOUS)
SUCTION TUBE FRAZIER 10FR DISP (MISCELLANEOUS) IMPLANT
SUT CHROMIC 4 0 RB 1X27 (SUTURE) ×3 IMPLANT
SUT CHROMIC 5 0 RB 1 27 (SUTURE) IMPLANT
SUT ETHIBOND 2 0 SH (SUTURE) ×4
SUT ETHIBOND 2 0 SH 36X2 (SUTURE) ×2 IMPLANT
SUT ETHIBOND 3-0 V-5 (SUTURE) ×1 IMPLANT
SUT SILK 4 0 TIES 17X18 (SUTURE) ×1 IMPLANT
SYR 3ML 23GX1 SAFETY (SYRINGE) ×4 IMPLANT
SYR BULB IRRIGATION 50ML (SYRINGE) IMPLANT
SYR CONTROL 10ML LL (SYRINGE) ×2 IMPLANT
SYR TB 1ML 27GX1/2 SAFE (SYRINGE) ×1 IMPLANT
SYR TB 1ML 27GX1/2 SAFETY (SYRINGE) ×2
SYRINGE 10CC LL (SYRINGE) ×2 IMPLANT
TOWEL OR 17X24 6PK STRL BLUE (TOWEL DISPOSABLE) ×4 IMPLANT
TUBE CONNECTING 12X1/4 (SUCTIONS) ×2 IMPLANT
WATER STERILE IRR 500ML POUR (IV SOLUTION) ×2 IMPLANT
YANKAUER SUCT BULB TIP NO VENT (SUCTIONS) ×2 IMPLANT

## 2016-12-09 NOTE — Anesthesia Procedure Notes (Signed)
Procedure Name: LMA Insertion Date/Time: 12/09/2016 7:32 AM Performed by: Wanita Chamberlain Pre-anesthesia Checklist: Patient identified, Timeout performed, Emergency Drugs available, Suction available and Patient being monitored Patient Re-evaluated:Patient Re-evaluated prior to inductionOxygen Delivery Method: Circle system utilized Preoxygenation: Pre-oxygenation with 100% oxygen Intubation Type: IV induction Ventilation: Mask ventilation without difficulty LMA: LMA inserted LMA Size: 5.0 Number of attempts: 1 Placement Confirmation: positive ETCO2,  breath sounds checked- equal and bilateral and CO2 detector Tube secured with: Tape Dental Injury: Teeth and Oropharynx as per pre-operative assessment

## 2016-12-09 NOTE — Anesthesia Postprocedure Evaluation (Signed)
Anesthesia Post Note  Patient: Lawrence [name].  Procedure(s) Performed: Procedure(s) (LRB): NESBIT PROCEDURE 16 DOT PLICATION (N/A)  Patient location during evaluation: PACU Anesthesia Type: General Level of consciousness: awake and alert Pain management: pain level controlled Vital Signs Assessment: post-procedure vital signs reviewed and stable Respiratory status: spontaneous breathing, nonlabored ventilation, respiratory function stable and patient connected to nasal cannula oxygen Cardiovascular status: blood pressure returned to baseline and stable Postop Assessment: no signs of nausea or vomiting Anesthetic complications: no       Last Vitals:  Vitals:   12/09/16 0945 12/09/16 1022  BP: (!) 152/95 (!) 153/88  Pulse: 60 (!) 59  Resp: 17 18  Temp:  36.9 C    Last Pain:  Vitals:   12/09/16 1055  TempSrc:   PainSc: 3                  Ragan Duhon EDWARD

## 2016-12-09 NOTE — Transfer of Care (Signed)
Immediate Anesthesia Transfer of Care Note  Patient: Lawrence [name].  Procedure(s) Performed: Procedure(s): NESBIT PROCEDURE 16 DOT PLICATION (N/A)  Patient Location: PACU  Anesthesia Type:General  Level of Consciousness: awake, alert , oriented and patient cooperative  Airway & Oxygen Therapy: Patient Spontanous Breathing and Patient connected to nasal cannula oxygen  Post-op Assessment: Report given to RN and Post -op Vital signs reviewed and stable  Post vital signs: Reviewed and stable  Last Vitals:  Vitals:   12/09/16 0553  BP: (!) 165/90  Pulse: 65  Resp: 19  Temp: 36.7 C    Last Pain:  Vitals:   12/09/16 0553  TempSrc: Oral      Patients Stated Pain Goal: 6 (46/50/35 4656)  Complications: No apparent anesthesia complications

## 2016-12-09 NOTE — H&P (Signed)
HPI: Lawrence [name] is a 66 year-old male established patient who is here for penile curvature or pain with erections.  He does have penile curvature with his erections. He has had penile curvature with his erections for 2 years. His penis curves downward. His penile curvature is based in the middle. His curvature has changed in the past 6 months. His curvature has not changed in the past 12 months. His symptoms have been stable over the last year.   He has not noticed a knot on his penis. He does not have penile narrowing with his erections. He does not have penile shortening with his erections. His symptoms of Peyronie's Disease do have an impact on intercourse. He has not been previously treated.   11/14/16: The patient states that his erections are approximately 70%. He is interested in obtaining some sildenafil at his follow-up visit. Symptoms present for 4 years.   The patient has a past medical history of plantar fasciitis. He also has a history of calcium deposits on the joints of his fingers.   On Xarleto. The patient has a history of esophagectomy for esophageal cancer with a recovery period extended by 10 months for associated complications of his seizure. This was in 2013.   Interval history 11/16/16: He reports that he was essentially bedridden for about 10 months back in 2013. This was after his esophagectomy and then for a while he was not sexually active but began to notice curvature of the penis downward. It was not associated with any pain. He said now he has curvature downward but no curvature to the right or left and describes the amount of curvature as greater than 90. This curvature has remained stable for the past 6 months. He has no voiding symptoms.     ALLERGIES: None   MEDICATIONS: Lisinopril 20 mg tablet  Metoprolol Succinate 25 mg tablet, extended release 24 hr  Amlodipine Besylate 5 mg tablet  Fluoxetine Hcl 40 mg capsule  Lantus  Lorazepam 1 mg tablet  Ranitidine  Hcl 150 mg capsule  Tramadol Hcl 50 mg tablet  Xarelto 20 mg tablet     GU PSH: None     PSH Notes: heart abliation, spinal fusion, gallbladder removal, pacemaker insert, esophageal removal   NON-GU PSH: Cholecystectomy (laparoscopic)    GU PMH: Peyronies Disease, The patient has Peyronie's disease based on his history. His exam was somewhat unremarkable as I was unable to locate a large plaque. Complicating this is the fact that his curve is ventral which may preclude him from Xiaflex injections. - 11/14/2016      PMH Notes: esophogeal cancer   NON-GU PMH: Anxiety Atrial Fibrillation Depression Diabetes Type 2 GERD Hypertension    FAMILY HISTORY: 1 Daughter - Daughter 3 Son's - Son Kidney Cancer - Father Prostate Cancer - Father   SOCIAL HISTORY: Marital Status: Married Current Smoking Status: Patient has never smoked.   Tobacco Use Assessment Completed: Used Tobacco in last 30 days? Does drink.  Drinks 1 caffeinated drink per day. Patient's occupation is/was Retired.    REVIEW OF SYSTEMS:    GU Review Male:   Patient denies frequent urination, hard to postpone urination, burning/ pain with urination, get up at night to urinate, leakage of urine, stream starts and stops, trouble starting your stream, have to strain to urinate , erection problems, and penile pain.  Gastrointestinal (Upper):   Patient denies nausea, vomiting, and indigestion/ heartburn.  Gastrointestinal (Lower):   Patient denies diarrhea and constipation.  Constitutional:  Patient denies fever, night sweats, weight loss, and fatigue.  Skin:   Patient denies skin rash/ lesion and itching.  Eyes:   Patient denies blurred vision and double vision.  Ears/ Nose/ Throat:   Patient denies sore throat and sinus problems.  Hematologic/Lymphatic:   Patient denies easy bruising and swollen glands.  Cardiovascular:   Patient denies leg swelling and chest pains.  Respiratory:   Patient denies cough and shortness  of breath.  Endocrine:   Patient denies excessive thirst.  Musculoskeletal:   Patient denies back pain and joint pain.  Neurological:   Patient denies headaches and dizziness.  Psychologic:   Patient denies depression and anxiety.   VITAL SIGNS:    BP 163/108 mmHg  Pulse 69 /min    GU PHYSICAL EXAMINATION:    Anus and Perineum: No hemorrhoids. No anal stenosis. No rectal fissure, no anal fissure. No edema, no dimple, no perineal tenderness, no anal tenderness.  Scrotum: No lesions. No edema. No cysts. No warts.  Epididymides: Right: no spermatocele, no masses, no cysts, no tenderness, no induration, no enlargement. Left: no spermatocele, no masses, no cysts, no tenderness, no induration, no enlargement.  Testes: No tenderness, no swelling, no enlargement left testes. No tenderness, no swelling, no enlargement right testes. Normal location left testes. Normal location right testes. No mass, no cyst, no varicocele, no hydrocele left testes. No mass, no cyst, no varicocele, no hydrocele right testes.  Urethral Meatus: Normal size. No lesion, no wart, no discharge, no polyp. Normal location.  Penis: The patient may have a plaque at the midportion of his penis on the ventral side right lateral to the urethra, it was difficult to palpate any additional plaques.  Prostate: 40 gram or 2+ size. Left lobe normal consistency, right lobe normal consistency. Symmetrical lobes. No prostate nodule. Left lobe no tenderness, right lobe no tenderness.  Seminal Vesicles: Nonpalpable.  Sphincter Tone: Normal sphincter. No rectal tenderness. No rectal mass.    MULTI-SYSTEM PHYSICAL EXAMINATION:    Constitutional: Well-nourished. No physical deformities. Normally developed. Good grooming.  Neck: Neck symmetrical, not swollen. Normal tracheal position.  Respiratory: No labored breathing, no use of accessory muscles.   Cardiovascular: Normal temperature, normal extremity pulses, no swelling, no varicosities.   Lymphatic: No enlargement of neck, axillae, groin.  Skin: No paleness, no jaundice, no cyanosis. No lesion, no ulcer, no rash.  Neurologic / Psychiatric: Oriented to time, oriented to place, oriented to person. No depression, no anxiety, no agitation.  Gastrointestinal: No mass, no tenderness, no rigidity, non obese abdomen.  Eyes: Normal conjunctivae. Normal eyelids.  Ears, Nose, Mouth, and Throat: Left ear no scars, no lesions, no masses. Right ear no scars, no lesions, no masses. Nose no scars, no lesions, no masses. Normal hearing. Normal lips.  Musculoskeletal: Normal gait and station of head and neck.       PAST DATA REVIEWED:  Source Of History:  Patient  Records Review:   Previous Patient Records, POC Tool   PROCEDURES: None   ASSESSMENT/PLAN:  1 GU:   Peyronies Disease - N48.6 Stable - We discussed the treatment options available for his penile curvature. Because he is able to achieve a satisfactory erection I did not discuss penile prosthesis implantation to any great degree with him however I did discuss plication as a surgical means of correcting his curvature with the understanding that this could potentially result in some foreshortening of the penis. I made him aware of the fact that the procedure would be  shortening the penis on the contralateral side to the curvature and the curvature was caused by foreshortening of the penis on the side where he was experiencing the curvature. We did discuss plaque incision and patch grafting as an alternative which may result in less penile shortening but does carry greater risk of de novo erectile dysfunction and glans numbness. We did discuss the fact that he is not a candidate for Xiaflex due to the fact that he has ventral curvature. He told me he is interested in proceeding with plication. He does understand there is going to be some foreshortening of his penis and he very possibly will be able to feel the knots from the plicating sutures.

## 2016-12-09 NOTE — Anesthesia Preprocedure Evaluation (Addendum)
Anesthesia Evaluation    Airway Mallampati: II  TM Distance: >3 FB Neck ROM: Limited  Mouth opening: Limited Mouth Opening  Dental  (+) Teeth Intact, Dental Advisory Given, Caps,    Pulmonary           Cardiovascular hypertension, Pt. on medications + pacemaker  Rhythm:Regular Rate:Normal  2D ECHO 02-12-14  Aortic valve:  Left ventricle: The cavity size was normal. There was severe focal basal and mild concentric hypertrophy. Systolic function was normal. The estimated ejection fraction was in the range of 55% to 60%. Wall motion was normal; there were no regional wall motion abnormalities. Trileaflet. Severe thickening and calcification, consistent with sclerosis. Mobility was not restricted.    Neuro/Psych    GI/Hepatic hiatal hernia, GERD  Medicated and Controlled,  Endo/Other  diabetes  Renal/GU      Musculoskeletal   Abdominal   Peds  Hematology   Anesthesia Other Findings   Reproductive/Obstetrics                          Anesthesia Physical Anesthesia Plan Anesthesia Quick Evaluation

## 2016-12-09 NOTE — Op Note (Signed)
PATIENT:  Lawrence [name].  PRE-OPERATIVE DIAGNOSIS: Ventral penile curvature  POST-OPERATIVE DIAGNOSIS: Same  PROCEDURE: 62-ZHY plication  SURGEON:  Claybon Jabs  INDICATION: Lawrence [name]. is a 66 year old male who has developed a greater than 75-80 curvature of his penis ventrally. He had no palpable Peyronie's plaque. Due to the ventral curvature and lack of plaque he was not a candidate for Xiaflex and therefore we discussed surgical correction with plication. He understands and has elected to proceed.  ANESTHESIA:  General  EBL:  Minimal  DRAINS: None  LOCAL MEDICATIONS USED:  1/2% Marcaine as a dorsal penile block  SPECIMEN:  None  Description of procedure: After informed consent the patient was taken to the operating room and placed on the table in a supine position. General anesthesia was then administered. Once fully anesthetized the genitalia were sterilely prepped and draped in standard fashion. An official timeout was then performed.  I passed a 31 French catheter into the bladder and emptied the bladder. I then injected the Marcaine at the base of the penis for a dorsal penile block.  I then injected 90 mg of papaverine. This resulted in an artificial erection and allowed me to place a Adeli Frost at the midportion of the curvature. It appeared to involve the entire penis from the base out to the glans. I therefore made a midline incision on the dorsum of the penis and carried this down through the fascia to expose the neurovascular bundle and then dissected the fascia off of the corpus cavernosum on the right and left sides approximately 1 cm from the midline. This allowed visualization of the dorsal neurovascular bundle and I then placed 8 equidistant marks on the corpus cavernosum approximately 5-6 mm from the midline extending from just proximal to the glans to the base of the penis. I then used 2-0 braided polyester suture and placed this through the marked locations  in the standard fashion of a 86-VHQ plication. After all 4 sutures were in place I placed a surgeon's knot in each and began to tie these placing a shod hemostat at the level of the knot in order to make adjustments in the tension of each of the sutures. This resulted in a straight penis with no right or left curvature and complete resolution of the ventral curvature. I then removed the shoded hemostats as each of the sutures were tied and the end result again was noted to be excellent straightening of the penis.  I used a 21-gauge butterfly needle to aspirate blood from the penis and then injected Neo-Synephrine to bring down his erection. I then closed the deep tissue with running 4-0 chromic and then closed the skin with running 4-0 chromic. I then applied Neosporin to the suture line and placed gauze sponges over the incision and wrapped the penis with a 2 inch conform dressing. The patient tolerated the procedure well with no intraoperative complications. Needle, sponge and instrument counts were correct 2 at the end of the operation.  PLAN OF CARE: Discharge to home after PACU  PATIENT DISPOSITION:  PACU - hemodynamically stable.

## 2016-12-09 NOTE — Anesthesia Preprocedure Evaluation (Addendum)
Anesthesia Evaluation  Patient identified by MRN, date of birth, ID band Patient awake    Reviewed: Allergy & Precautions, H&P , NPO status , Patient's Chart, lab work & pertinent test results  History of Anesthesia Complications Negative for: history of anesthetic complications  Airway Mallampati: II  TM Distance: >3 FB Neck ROM: Full    Dental no notable dental hx. (+) Teeth Intact   Pulmonary neg pulmonary ROS,    Pulmonary exam normal breath sounds clear to auscultation       Cardiovascular Exercise Tolerance: Good hypertension, + dysrhythmias Atrial Fibrillation + pacemaker  Rhythm:Irregular Rate:Normal     Neuro/Psych TIA   GI/Hepatic Neg liver ROS, hiatal hernia, GERD  Medicated and Controlled,Hx of Esophagectomy for malignancy   Endo/Other  diabetesMorbid obesity  Renal/GU negative Renal ROS     Musculoskeletal   Abdominal (+) + obese,   Peds  Hematology   Anesthesia Other Findings   Reproductive/Obstetrics                            Anesthesia Physical  Anesthesia Plan  ASA: III  Anesthesia Plan: General   Post-op Pain Management:    Induction: Intravenous  Airway Management Planned: LMA  Additional Equipment:   Intra-op Plan: Utilization of Controlled Hypotension per surrgeon request  Post-operative Plan: Extubation in OR  Informed Consent: I have reviewed the patients History and Physical, chart, labs and discussed the procedure including the risks, benefits and alternatives for the proposed anesthesia with the patient or authorized representative who has indicated his/her understanding and acceptance.     Plan Discussed with: CRNA  Anesthesia Plan Comments: ( Atrial Paced  Off Xarelto since Tues  Echocardiogram in December 2016 revealed mild decline in left ventricular function with EF of 50%)       Anesthesia Quick Evaluation

## 2016-12-09 NOTE — Discharge Instructions (Addendum)
Post Anesthesia Home Care Instructions  Activity: Get plenty of rest for the remainder of the day. A responsible individual must stay with you for 24 hours following the procedure.  For the next 24 hours, DO NOT: -Drive a car -Paediatric nurse -Drink alcoholic beverages -Take any medication unless instructed by your physician -Make any legal decisions or sign important papers.  Meals: Start with liquid foods such as gelatin or soup. Progress to regular foods as tolerated. Avoid greasy, spicy, heavy foods. If nausea and/or vomiting occur, drink only clear liquids until the nausea and/or vomiting subsides. Call your physician if vomiting continues.  Special Instructions/Symptoms: Your throat may feel dry or sore from the anesthesia or the breathing tube placed in your throat during surgery. If this causes discomfort, gargle with warm salt water. The discomfort should disappear within 24 hours.  If you had a scopolamine patch placed behind your ear for the management of post- operative nausea and/or vomiting:  1. The medication in the patch is effective for 72 hours, after which it should be removed.  Wrap patch in a tissue and discard in the trash. Wash hands thoroughly with soap and water. 2. You may remove the patch earlier than 72 hours if you experience unpleasant side effects which may include dry mouth, dizziness or visual disturbances. 3. Avoid touching the patch. Wash your hands with soap and water after contact with the patch.   Call your surgeon if you experience:   1.  Fever over 101.0. 2.  Inability to urinate. 3.  Nausea and/or vomiting. 4.  Extreme swelling or bruising at the surgical site. 5.  Continued bleeding from the incision. 6.  Increased pain, redness or drainage from the incision. 7.  Problems related to your pain medication. 8.  Any problems and/or concernsPostoperative instructions for plication surgery  Wound:  In most cases your incision will have  absorbable sutures that run along the course of your incision and will dissolve within the first 10-20 days. Some will fall out even earlier. Expect some redness as the sutures dissolved but this should occur only around the sutures. If there is generalized redness, especially with increasing pain or swelling, let us know. The penis will very likely get "black and blue" as the blood in the tissues spread. Sometimes the whole penis will turn colors. The black and blue is followed by a yellow and brown color. In time, all the discoloration will go away.  Diet:  You may return to your normal diet within 24 hours following your surgery. You may note some mild nausea and possibly vomiting the first 6-8 hours following surgery. This is usually due to the side effects of anesthesia, and will disappear quite soon. I would suggest clear liquids and a very light meal the first evening following your surgery.  Activity:  Your physical activity should be restricted the first 48 hours. During that time you should remain relatively inactive, moving about only when necessary. During the first 7-10 days following surgery he should avoid lifting any heavy objects (anything greater than 15 pounds), and avoid strenuous exercise. If you work, ask [place] specifically about your restrictions, both for work and home. We will write a note to your employer if needed.  Ice packs can be placed on and off over the penis for the first 48 hours to help relieve the pain and keep the swelling down. Frozen peas or corn in a ZipLock bag can be frozen, used and re-frozen. Fifteen minutes on and 15  minutes off is a reasonable schedule.   Hygiene:  You may shower 48 hours after your surgery. Tub bathing should be restricted until the seventh day.  Medication:  You will be sent home with some type of pain medication. In many cases you will be sent home with a narcotic pain pill (Vicodin or Tylox). If the pain is not too bad, you may take  either Tylenol (acetaminophen) or Advil (ibuprofen) which contain no narcotic agents, and might be tolerated a little better, with fewer side effects. If the pain medication you are sent home with does not control the pain, you will have to let us know. Some narcotic pain medications cannot be given or refilled by a phone call to a pharmacy.  Problems you should report to [place]:   Fever of 101.0 degrees Fahrenheit or greater.  Moderate or severe swelling under the skin incision or involving the scrotum.  Drug reaction such as hives, a rash, nausea or vomiting.

## 2016-12-12 ENCOUNTER — Encounter (HOSPITAL_BASED_OUTPATIENT_CLINIC_OR_DEPARTMENT_OTHER): Payer: Self-pay | Admitting: Urology

## 2016-12-13 ENCOUNTER — Encounter: Payer: Self-pay | Admitting: Interventional Cardiology

## 2016-12-13 DIAGNOSIS — I10 Essential (primary) hypertension: Secondary | ICD-10-CM | POA: Diagnosis not present

## 2016-12-13 DIAGNOSIS — J209 Acute bronchitis, unspecified: Secondary | ICD-10-CM | POA: Diagnosis not present

## 2016-12-20 DIAGNOSIS — J45901 Unspecified asthma with (acute) exacerbation: Secondary | ICD-10-CM | POA: Diagnosis not present

## 2016-12-22 DIAGNOSIS — N486 Induration penis plastica: Secondary | ICD-10-CM | POA: Diagnosis not present

## 2016-12-28 NOTE — Progress Notes (Signed)
Cardiology Office Note    Date:  12/29/2016   ID:  Lawrence [name]., DOB [birth year], MRN [id number]  PCP:  Irven Shelling, MD  Cardiologist: [name], MD   Chief Complaint  Patient presents with  . Essential Hypertension  . Atrial Fibrillation    History of Present Illness:  Lawrence [name]. is a 66 y.o. male malefor f/u atrial fibrillation (Dr.Daubert at [facility]) , dofetilide therapy, chronic anticoagulation, hypertension, TIA, and hyperlipidemia.. Prior history of esophagectomy for esophageal cancer 2013.  Lawrence [name] has no complaints. He underwent an ablation at [facility] in the fall. Heart rhythm is been relatively stable since that time. His Tikosyn and pacemaker are being followed by the EP team at [facility], Dr. Lurene Shadow. Overall, doing well and has no specific complaints.  Past Medical History:  Diagnosis Date  . Bradycardia   . GERD (gastroesophageal reflux disease)   . H/O hiatal hernia    s/p  repair 06-26-2012  . History of esophagectomy 08-21-2012  AT [facility]  . History of kidney stones 2006  . History of malignant neoplasm of esophagus ONCOLOGIST-  DR D'AMICO AT [facility]-- PER LAST NOTE 01/ 2018  NO RECURRENCE   dx 10/ 2013  Stage 2A (T2 N0)  08-21-2012 s/p  esophagectomy w/ gastric pull-through [facility]) post residual recurrent anastomotic stricture (multiple EGD w/ dilations)  . History of transient ischemic attack (TIA) 2003   no residual  . Hypertension   . Long term (current) use of anticoagulants    xarelto  . On dofetilide therapy   . PAF (paroxysmal atrial fibrillation) [facility]) first dx 2009   primary cardiologist-  dr Daneen Schick /  EP cardiologist -- dr Lurene Shadow ([facility])  s/p  afib ablation 08-02-2016  . Pancreatic cyst    x2   . Peyronie's disease   . Presence of permanent cardiac pacemaker implanted 05-01-2014  at [facility] follows  . Pulmonary nodule, right    upper and  middle lobes --  per last CT stable  . Type 2 diabetes mellitus treated with insulin ([place])   . Wears glasses     Past Surgical History:  Procedure Laterality Date  . ANTERIOR CERVICAL DECOMP/DISCECTOMY FUSION N/A 06/02/2016   Procedure: ANTERIOR CERVICAL DECOMPRESSION FUSION CERVICAL 5-6, CERVICAL 6-7 WITH INSTRUMENTATION AND ALLOGRAFT;  Surgeon: Phylliss Bob, MD;  Location: [facility];  Service: Orthopedics;  Laterality: N/A;  ANTERIOR CERVICAL DECOMPRESSION FUSION CERVICAL 5-6, CERVICAL 6-7 WITH INSTRUMENTATION AND ALLOGRAFT  . BIOPSY  06/26/2012   Procedure: BIOPSY;  Surgeon: Madilyn Hook, DO;  Location: [facility];  Service: General;;  . CARDIAC ELECTROPHYSIOLOGY STUDY AND ABLATION  08-02-2016   [facility]   (dr Lurene Shadow)   pulmonary veins isolation and ablation afib  . CARDIAC PACEMAKER PLACEMENT  05-01-2014   [facility]  . CHOLECYSTECTOMY  1988  . COLONOSCOPY WITH PROPOFOL N/A 07/13/2015   Procedure: COLONOSCOPY WITH PROPOFOL;  Surgeon: Garlan Fair, MD;  Location[facility];  Service: Endoscopy;  Laterality: N/A;  . ESOPHAGECTOMY  08-21-2012   [facility]   w/ gastric pull-through  . ESOPHAGOGASTRODUODENOSCOPY (EGD) WITH ESOPHAGEAL DILATION  multiple x32 per pt approx.--- last one 02-09-2015   recurrent anastomotic stricture post esophagectomy  . HIATAL HERNIA REPAIR  06/26/2012   Procedure: LAPAROSCOPIC REPAIR OF HIATAL HERNIA;  Surgeon: Madilyn Hook, DO;  Location[facility];  Service: General;;  . Lawrence [name] HERNIA REPAIR  06/26/2012   Procedure: HERNIA REPAIR  INCISIONAL;  Surgeon: Madilyn Hook, DO;  Location: [facility];  Service: General;;  . INGUINAL HERNIA REPAIR Bilateral 1987  . JEJUNOSTOMY FEEDING TUBE  12/22/2012   Removed 12/ 2016  approx.  Lawrence [name] KNEE ARTHROSCOPY Left 1990  . MASS EXCISION Left 04/04/2013   Procedure: EXCISION OF SCALP MASS;  Surgeon: Madilyn Hook, DO;  Location: [facility];  Service: General;  Laterality: Left;   pilomatrixoma  . NESBIT PROCEDURE N/A 12/09/2016   Procedure: NESBIT  PROCEDURE 16 DOT PLICATION;  Surgeon: Kathie Rhodes, MD;  Location: [facility];  Service: Urology;  Laterality: N/A;  . TONSILLECTOMY  1960  . TRANSTHORACIC ECHOCARDIOGRAM  08-06-2015   [facility]   mild LVH, ef 50%/  mild RAE /  moderate LAE/  trivial AR, MR, PR,  and TR    Current Medications: Outpatient Medications Prior to Visit  Medication Sig Dispense Refill  . amLODipine (NORVASC) 5 MG tablet Take 5 mg by mouth every morning.   11  . dofetilide (TIKOSYN) 500 MCG capsule Take 500 mcg by mouth 2 (two) times daily.    Lawrence [name] FLUoxetine (PROZAC) 40 MG capsule Take 40 mg by mouth every morning.    Lawrence [name] LANTUS 100 UNIT/ML injection Inject 14 Units into the skin every morning.  3  . lisinopril (PRINIVIL,ZESTRIL) 20 MG tablet Take 20 mg by mouth every morning.   3  . LORazepam (ATIVAN) 1 MG tablet Take 1 mg by mouth at bedtime.    . metoprolol tartrate (LOPRESSOR) 25 MG tablet Take 1 tablet (25 mg total) by mouth 2 (two) times daily. 180 tablet 3  . ranitidine (ZANTAC) 150 MG tablet Take 150 mg by mouth 2 (two) times daily.    . traMADol (ULTRAM) 50 MG tablet Take 50 mg by mouth every 12 (twelve) hours as needed.    . vitamin B-12 (CYANOCOBALAMIN) 1000 MCG tablet Take 1,000 mcg by mouth every other day.    . rivaroxaban (XARELTO) 20 MG TABS tablet Take 1 tablet (20 mg total) by mouth daily. 30 tablet 1  . oxyCODONE-acetaminophen (PERCOCET) 10-325 MG tablet Take 1-2 tablets by mouth every 4 (four) hours as needed for pain. Maximum dose per 24 hours - 8 pills 30 tablet 0   No facility-administered medications prior to visit.      Allergies:   Patient has no known allergies.   Social History   Social History  . Marital status: Married    Spouse name: N/A  . Number of children: N/A  . Years of education: N/A   Social History Main Topics  . Smoking status: Never Smoker  . Smokeless tobacco: Never Used  . Alcohol use Yes     Comment: socially  . Drug use: No  . Sexual activity: Not  Asked   Other Topics Concern  . None   Social History Narrative  . None     Family History:  The patient's family history includes Cancer in his father.   ROS:   Please see the history of present illness.    Cough, anxiety, wheezing, otherwise unremarkable. His jaw and Medicare.  All other systems reviewed and are negative.   PHYSICAL EXAM:   VS:  BP 130/80   Pulse 61   Ht 6\' 1"  (1.854 m)   Wt 233 lb 12.8 oz (106.1 kg)   BMI 30.85 kg/m    GEN: Well nourished, well developed, in no acute distress  HEENT: normal  Neck: no JVD, carotid bruits, or masses Cardiac: RRR;  no murmurs, rubs, or gallops,no edema  Respiratory:  clear to auscultation bilaterally, normal work of breathing GI: soft, nontender, nondistended, + BS MS: no deformity or atrophy  Skin: warm and dry, no rash Neuro:  Alert and Oriented x 3, Strength and sensation are intact Psych: euthymic mood, full affect  Wt Readings from Last 3 Encounters:  12/29/16 233 lb 12.8 oz (106.1 kg)  12/09/16 238 lb (108 kg)  06/20/16 231 lb 12.8 oz (105.1 kg)      Studies/Labs Reviewed:   EKG:  EKG  Atrial pacing. Rate of 60.  Recent Labs: 05/26/2016: ALT 18; Magnesium 2.0; Platelets 193 12/09/2016: BUN 11; Creatinine, Ser 0.90; Hemoglobin 14.3; Potassium 3.9; Sodium 137   Lipid Panel    Component Value Date/Time   CHOL 157 04/13/2012 0825   TRIG 103 04/13/2012 0825   HDL 40 04/13/2012 0825   CHOLHDL 3.9 04/13/2012 0825   VLDL 21 04/13/2012 0825   LDLCALC 96 04/13/2012 0825    Additional studies/ records that were reviewed today include:  Most recent labs the [facility] in February revealed a magnesium of 1.7 and potassium of 4.7.    ASSESSMENT:    1. Paroxysmal atrial fibrillation (HCC)   2. Hypertension, benign   3. Other specified transient cerebral ischemias   4. Chronic anticoagulation   5. DDD Pacemaker      PLAN:  In order of problems listed above:  1. Status post ablation  and has dual-chamber pacemaker now this is being managed [facility] by Dr. Kayleen Memos. Since ablation in the fall has had no significant recurrent arrhythmia. He denies medication side effects. Magnesium was 1.7 in February and [facility]. Potassium was normal as was kidney function. Continue same therapy. 2. Excellent control 3. No neurological complaints 4. Xarelto prescription is refilled today. 5. Pacer is followed at the [facility] device clinic.   Clinical follow-up with me in one year. Call if cardiac symptoms. No change in therapy.    Medication Adjustments/Labs and Tests Ordered: Current medicines are reviewed at length with the patient today.  Concerns regarding medicines are outlined above.  Medication changes, Labs and Tests ordered today are listed in the Patient Instructions below. Patient Instructions  Medication Instructions:  None  Labwork: None  Testing/Procedures: None  Follow-Up: Your physician wants you to follow-up in: 1 year with Dr. Tamala Julian.  You will receive a reminder letter in the mail two months in advance. If you don't receive a letter, please call our office to schedule the follow-up appointment.   Any Other Special Instructions Will Be Listed Below (If Applicable).     If you need a refill on your cardiac medications before your next appointment, please call your pharmacy.      Signed, [name], MD  12/29/2016 8:18 AM    [facility] [address], [place], [place]  [address] Phone: [phone]; Fax: [phone]

## 2016-12-29 ENCOUNTER — Ambulatory Visit (INDEPENDENT_AMBULATORY_CARE_PROVIDER_SITE_OTHER): Payer: Medicare Other | Admitting: Interventional Cardiology

## 2016-12-29 ENCOUNTER — Encounter: Payer: Self-pay | Admitting: Interventional Cardiology

## 2016-12-29 VITALS — BP 130/80 | HR 61 | Ht 73.0 in | Wt 233.8 lb

## 2016-12-29 DIAGNOSIS — Z7901 Long term (current) use of anticoagulants: Secondary | ICD-10-CM | POA: Diagnosis not present

## 2016-12-29 DIAGNOSIS — Z950 Presence of cardiac pacemaker: Secondary | ICD-10-CM

## 2016-12-29 DIAGNOSIS — G458 Other transient cerebral ischemic attacks and related syndromes: Secondary | ICD-10-CM

## 2016-12-29 DIAGNOSIS — I480 Paroxysmal atrial fibrillation: Secondary | ICD-10-CM | POA: Diagnosis not present

## 2016-12-29 DIAGNOSIS — I10 Essential (primary) hypertension: Secondary | ICD-10-CM

## 2016-12-29 MED ORDER — RIVAROXABAN 20 MG PO TABS: 20.0000 mg | ORAL_TABLET | Freq: Every day | ORAL | 3 refills | Status: DC

## 2016-12-29 NOTE — Patient Instructions (Signed)

## 2017-01-09 DIAGNOSIS — R001 Bradycardia, unspecified: Secondary | ICD-10-CM | POA: Diagnosis not present

## 2017-01-09 DIAGNOSIS — R05 Cough: Secondary | ICD-10-CM | POA: Diagnosis not present

## 2017-01-09 DIAGNOSIS — Z794 Long term (current) use of insulin: Secondary | ICD-10-CM | POA: Diagnosis not present

## 2017-01-09 DIAGNOSIS — R062 Wheezing: Secondary | ICD-10-CM | POA: Diagnosis not present

## 2017-01-09 DIAGNOSIS — R9431 Abnormal electrocardiogram [ECG] [EKG]: Secondary | ICD-10-CM | POA: Diagnosis not present

## 2017-01-09 DIAGNOSIS — Z79899 Other long term (current) drug therapy: Secondary | ICD-10-CM | POA: Diagnosis not present

## 2017-01-09 DIAGNOSIS — Z950 Presence of cardiac pacemaker: Secondary | ICD-10-CM | POA: Diagnosis not present

## 2017-01-09 DIAGNOSIS — R198 Other specified symptoms and signs involving the digestive system and abdomen: Secondary | ICD-10-CM | POA: Diagnosis not present

## 2017-01-09 DIAGNOSIS — E119 Type 2 diabetes mellitus without complications: Secondary | ICD-10-CM | POA: Diagnosis not present

## 2017-01-09 DIAGNOSIS — I481 Persistent atrial fibrillation: Secondary | ICD-10-CM | POA: Diagnosis not present

## 2017-01-09 DIAGNOSIS — Z45018 Encounter for adjustment and management of other part of cardiac pacemaker: Secondary | ICD-10-CM | POA: Diagnosis not present

## 2017-01-09 DIAGNOSIS — Z7901 Long term (current) use of anticoagulants: Secondary | ICD-10-CM | POA: Diagnosis not present

## 2017-01-09 DIAGNOSIS — I472 Ventricular tachycardia: Secondary | ICD-10-CM | POA: Diagnosis not present

## 2017-01-09 DIAGNOSIS — Z86010 Personal history of colonic polyps: Secondary | ICD-10-CM | POA: Diagnosis not present

## 2017-02-21 DIAGNOSIS — F325 Major depressive disorder, single episode, in full remission: Secondary | ICD-10-CM | POA: Diagnosis not present

## 2017-02-21 DIAGNOSIS — I10 Essential (primary) hypertension: Secondary | ICD-10-CM | POA: Diagnosis not present

## 2017-02-21 DIAGNOSIS — Z23 Encounter for immunization: Secondary | ICD-10-CM | POA: Diagnosis not present

## 2017-02-21 DIAGNOSIS — Z1389 Encounter for screening for other disorder: Secondary | ICD-10-CM | POA: Diagnosis not present

## 2017-02-21 DIAGNOSIS — N486 Induration penis plastica: Secondary | ICD-10-CM | POA: Diagnosis not present

## 2017-02-21 DIAGNOSIS — Z111 Encounter for screening for respiratory tuberculosis: Secondary | ICD-10-CM | POA: Diagnosis not present

## 2017-02-21 DIAGNOSIS — E1142 Type 2 diabetes mellitus with diabetic polyneuropathy: Secondary | ICD-10-CM | POA: Diagnosis not present

## 2017-02-21 DIAGNOSIS — K219 Gastro-esophageal reflux disease without esophagitis: Secondary | ICD-10-CM | POA: Diagnosis not present

## 2017-02-21 DIAGNOSIS — E1165 Type 2 diabetes mellitus with hyperglycemia: Secondary | ICD-10-CM | POA: Diagnosis not present

## 2017-02-21 DIAGNOSIS — Z794 Long term (current) use of insulin: Secondary | ICD-10-CM | POA: Diagnosis not present

## 2017-02-21 DIAGNOSIS — I4891 Unspecified atrial fibrillation: Secondary | ICD-10-CM | POA: Diagnosis not present

## 2017-02-21 DIAGNOSIS — F411 Generalized anxiety disorder: Secondary | ICD-10-CM | POA: Diagnosis not present

## 2017-02-21 DIAGNOSIS — Z0000 Encounter for general adult medical examination without abnormal findings: Secondary | ICD-10-CM | POA: Diagnosis not present

## 2017-02-21 DIAGNOSIS — E113299 Type 2 diabetes mellitus with mild nonproliferative diabetic retinopathy without macular edema, unspecified eye: Secondary | ICD-10-CM | POA: Diagnosis not present

## 2017-02-21 DIAGNOSIS — E782 Mixed hyperlipidemia: Secondary | ICD-10-CM | POA: Diagnosis not present

## 2017-02-28 DIAGNOSIS — N486 Induration penis plastica: Secondary | ICD-10-CM | POA: Diagnosis not present

## 2017-03-17 NOTE — Anesthesia Postprocedure Evaluation (Signed)
Anesthesia Post Note  Patient: Lawrence [name].  Procedure(s) Performed: Procedure(s) (LRB): NESBIT PROCEDURE 16 DOT PLICATION (N/A)     Anesthesia Post Evaluation  Last Vitals:  Vitals:   12/09/16 0945 12/09/16 1022  BP: (!) 152/95 (!) 153/88  Pulse: 60 (!) 59  Resp: 17 18  Temp:  36.9 C    Last Pain:  Vitals:   12/13/16 1332  TempSrc:   PainSc: 6                  Reshaun Briseno EDWARD

## 2017-03-17 NOTE — Addendum Note (Signed)
Addendum  created 03/17/17 1054 by Lyndle Herrlich, MD   Sign clinical note

## 2017-03-31 DIAGNOSIS — Z8501 Personal history of malignant neoplasm of esophagus: Secondary | ICD-10-CM | POA: Diagnosis not present

## 2017-03-31 DIAGNOSIS — Z7901 Long term (current) use of anticoagulants: Secondary | ICD-10-CM | POA: Diagnosis not present

## 2017-03-31 DIAGNOSIS — S61212A Laceration without foreign body of right middle finger without damage to nail, initial encounter: Secondary | ICD-10-CM | POA: Diagnosis not present

## 2017-03-31 DIAGNOSIS — I10 Essential (primary) hypertension: Secondary | ICD-10-CM | POA: Diagnosis not present

## 2017-03-31 DIAGNOSIS — W268XXA Contact with other sharp object(s), not elsewhere classified, initial encounter: Secondary | ICD-10-CM | POA: Diagnosis not present

## 2017-03-31 DIAGNOSIS — Z23 Encounter for immunization: Secondary | ICD-10-CM | POA: Diagnosis not present

## 2017-03-31 DIAGNOSIS — Z950 Presence of cardiac pacemaker: Secondary | ICD-10-CM | POA: Diagnosis not present

## 2017-03-31 DIAGNOSIS — E119 Type 2 diabetes mellitus without complications: Secondary | ICD-10-CM | POA: Diagnosis not present

## 2017-04-07 DIAGNOSIS — Z4802 Encounter for removal of sutures: Secondary | ICD-10-CM | POA: Diagnosis not present

## 2017-04-07 DIAGNOSIS — I10 Essential (primary) hypertension: Secondary | ICD-10-CM | POA: Diagnosis not present

## 2017-04-07 DIAGNOSIS — S61212D Laceration without foreign body of right middle finger without damage to nail, subsequent encounter: Secondary | ICD-10-CM | POA: Diagnosis not present

## 2017-04-07 DIAGNOSIS — E119 Type 2 diabetes mellitus without complications: Secondary | ICD-10-CM | POA: Diagnosis not present

## 2017-04-12 DIAGNOSIS — Z7901 Long term (current) use of anticoagulants: Secondary | ICD-10-CM | POA: Diagnosis not present

## 2017-04-12 DIAGNOSIS — S61212D Laceration without foreign body of right middle finger without damage to nail, subsequent encounter: Secondary | ICD-10-CM | POA: Diagnosis not present

## 2017-04-12 DIAGNOSIS — Z4802 Encounter for removal of sutures: Secondary | ICD-10-CM | POA: Diagnosis not present

## 2017-04-12 DIAGNOSIS — E119 Type 2 diabetes mellitus without complications: Secondary | ICD-10-CM | POA: Diagnosis not present

## 2017-04-12 DIAGNOSIS — I10 Essential (primary) hypertension: Secondary | ICD-10-CM | POA: Diagnosis not present

## 2017-04-17 DIAGNOSIS — Z950 Presence of cardiac pacemaker: Secondary | ICD-10-CM | POA: Diagnosis not present

## 2017-04-17 DIAGNOSIS — Z45018 Encounter for adjustment and management of other part of cardiac pacemaker: Secondary | ICD-10-CM | POA: Diagnosis not present

## 2017-04-17 DIAGNOSIS — I472 Ventricular tachycardia: Secondary | ICD-10-CM | POA: Diagnosis not present

## 2017-04-17 DIAGNOSIS — Z7901 Long term (current) use of anticoagulants: Secondary | ICD-10-CM | POA: Diagnosis not present

## 2017-05-17 DIAGNOSIS — Z23 Encounter for immunization: Secondary | ICD-10-CM | POA: Diagnosis not present

## 2017-05-19 DIAGNOSIS — I10 Essential (primary) hypertension: Secondary | ICD-10-CM | POA: Diagnosis not present

## 2017-05-19 DIAGNOSIS — F411 Generalized anxiety disorder: Secondary | ICD-10-CM | POA: Diagnosis not present

## 2017-05-19 DIAGNOSIS — K219 Gastro-esophageal reflux disease without esophagitis: Secondary | ICD-10-CM | POA: Diagnosis not present

## 2017-06-07 DIAGNOSIS — E1142 Type 2 diabetes mellitus with diabetic polyneuropathy: Secondary | ICD-10-CM | POA: Diagnosis not present

## 2017-06-07 DIAGNOSIS — E113299 Type 2 diabetes mellitus with mild nonproliferative diabetic retinopathy without macular edema, unspecified eye: Secondary | ICD-10-CM | POA: Diagnosis not present

## 2017-06-07 DIAGNOSIS — K219 Gastro-esophageal reflux disease without esophagitis: Secondary | ICD-10-CM | POA: Diagnosis not present

## 2017-06-07 DIAGNOSIS — I10 Essential (primary) hypertension: Secondary | ICD-10-CM | POA: Diagnosis not present

## 2017-06-12 DIAGNOSIS — I10 Essential (primary) hypertension: Secondary | ICD-10-CM | POA: Diagnosis not present

## 2017-06-12 DIAGNOSIS — E1142 Type 2 diabetes mellitus with diabetic polyneuropathy: Secondary | ICD-10-CM | POA: Diagnosis not present

## 2017-06-12 DIAGNOSIS — Z794 Long term (current) use of insulin: Secondary | ICD-10-CM | POA: Diagnosis not present

## 2017-06-21 DIAGNOSIS — E1142 Type 2 diabetes mellitus with diabetic polyneuropathy: Secondary | ICD-10-CM | POA: Diagnosis not present

## 2017-06-21 DIAGNOSIS — Z794 Long term (current) use of insulin: Secondary | ICD-10-CM | POA: Diagnosis not present

## 2017-06-21 DIAGNOSIS — I10 Essential (primary) hypertension: Secondary | ICD-10-CM | POA: Diagnosis not present

## 2017-06-27 DIAGNOSIS — I4891 Unspecified atrial fibrillation: Secondary | ICD-10-CM | POA: Diagnosis not present

## 2017-06-27 DIAGNOSIS — F331 Major depressive disorder, recurrent, moderate: Secondary | ICD-10-CM | POA: Diagnosis not present

## 2017-06-27 DIAGNOSIS — E1142 Type 2 diabetes mellitus with diabetic polyneuropathy: Secondary | ICD-10-CM | POA: Diagnosis not present

## 2017-06-27 DIAGNOSIS — G459 Transient cerebral ischemic attack, unspecified: Secondary | ICD-10-CM | POA: Diagnosis not present

## 2017-06-27 DIAGNOSIS — N400 Enlarged prostate without lower urinary tract symptoms: Secondary | ICD-10-CM | POA: Diagnosis not present

## 2017-06-27 DIAGNOSIS — E113299 Type 2 diabetes mellitus with mild nonproliferative diabetic retinopathy without macular edema, unspecified eye: Secondary | ICD-10-CM | POA: Diagnosis not present

## 2017-06-27 DIAGNOSIS — F325 Major depressive disorder, single episode, in full remission: Secondary | ICD-10-CM | POA: Diagnosis not present

## 2017-06-27 DIAGNOSIS — I10 Essential (primary) hypertension: Secondary | ICD-10-CM | POA: Diagnosis not present

## 2017-06-27 DIAGNOSIS — E782 Mixed hyperlipidemia: Secondary | ICD-10-CM | POA: Diagnosis not present

## 2017-07-03 ENCOUNTER — Telehealth: Payer: Self-pay

## 2017-07-03 NOTE — Telephone Encounter (Signed)
**Note De-Identified  Obfuscation** Per The pts wife I have done a Tier Exception on the pts Xarelto through covermymeds.

## 2017-07-04 NOTE — Telephone Encounter (Signed)
Received a fax from [facility] stating that the pt does not have coverage with them.  I called [facility] and was giving the correct insurance info. Will re-do this tier exception in the morning.

## 2017-07-05 NOTE — Telephone Encounter (Signed)
**Note De-Identified  Obfuscation** Medicare will not allow a tier exception on Xarelto because it is a brand name medication and cannot be lowered in tiers.   The pt is advised and he states that he is willing to try a lower costing medication. I explained Coumadin to him and what is involved and he is interested if this is ok with Dr Tamala Julian. If Dr Tamala Julian is in agreement the pt has questions regarding Coumadin that he wants to discuss.  He is advised that I will send this message to Dr Tamala Julian and his nurse and that we will contact him with Dr Darliss Ridgel recommendations.

## 2017-07-05 NOTE — Telephone Encounter (Signed)
Okay to start coumadin via anticoagulation clinic.

## 2017-07-06 NOTE — Telephone Encounter (Signed)
Will route to [facility] to f/u

## 2017-07-07 NOTE — Telephone Encounter (Signed)
Spoke with pt at length regarding coumadin and overlapping with Xarelto for 3 days and then continuing with coumadin He states has 20 more tablets of Xarelto and explained  that when h he is down to 3 tablets to start taking Xarelto and coumadin daily then would drop Xarelto and continue coumadin and would make appt to be seen in coumadin clinic in 1 week . Pt asks if there is anything else he can do to be able to stay on Xarelto Spoke with Lawrence [name] PharmD and she states could pay out pf pocket for next 2 months and then he would be out of donut hole in January He states at present to hold off on starting the coumadin and he will check further this weekend and will call back on Monday and let us know if he is going to start coumadin

## 2017-07-07 NOTE — Telephone Encounter (Signed)
Have called pt twice this am regarding this note Left another message to return call to coumadin clinic

## 2017-07-07 NOTE — Telephone Encounter (Signed)
New Message ° ° pt verbalized that he is returning call for rn  °

## 2017-07-12 MED ORDER — WARFARIN SODIUM 5 MG PO TABS: 5.0000 mg | ORAL_TABLET | Freq: Every day | ORAL | 0 refills | Status: DC

## 2017-07-12 NOTE — Telephone Encounter (Signed)
Spoke with pt and he states he wants to change from Xarelto to coumadin so instructed as he has some Xarelto left to overlap Coumadin 5mg  daily and Xarelto for 3 days Nov 14th Nov 15th and Nov 16th then stop Xarelto and continue coumadin 5mg  daily and made appt to be seen  In coumadin clinic on Nov 19th at 10:35am and pt states understanding

## 2017-07-24 ENCOUNTER — Ambulatory Visit (INDEPENDENT_AMBULATORY_CARE_PROVIDER_SITE_OTHER): Payer: Medicare Other | Admitting: Pharmacist

## 2017-07-24 DIAGNOSIS — Z5181 Encounter for therapeutic drug level monitoring: Secondary | ICD-10-CM | POA: Diagnosis not present

## 2017-07-24 DIAGNOSIS — G459 Transient cerebral ischemic attack, unspecified: Secondary | ICD-10-CM | POA: Diagnosis not present

## 2017-07-24 DIAGNOSIS — I480 Paroxysmal atrial fibrillation: Secondary | ICD-10-CM | POA: Diagnosis not present

## 2017-07-24 LAB — POCT INR: INR: 1.5

## 2017-07-24 NOTE — Patient Instructions (Signed)
Take an extra half tablet tonight and then increase your dose to 1 tablet (5mg ) daily except 1 and 1/2 tablets (7.5mg ) on Mondays, Wednesdays, and Fridays. Follow-up in 1 week. Call clinic with any concerns [phone]

## 2017-07-26 ENCOUNTER — Telehealth: Payer: Self-pay | Admitting: Interventional Cardiology

## 2017-07-26 DIAGNOSIS — N400 Enlarged prostate without lower urinary tract symptoms: Secondary | ICD-10-CM | POA: Diagnosis not present

## 2017-07-26 DIAGNOSIS — I4891 Unspecified atrial fibrillation: Secondary | ICD-10-CM | POA: Diagnosis not present

## 2017-07-26 DIAGNOSIS — E1142 Type 2 diabetes mellitus with diabetic polyneuropathy: Secondary | ICD-10-CM | POA: Diagnosis not present

## 2017-07-26 DIAGNOSIS — I10 Essential (primary) hypertension: Secondary | ICD-10-CM | POA: Diagnosis not present

## 2017-07-26 DIAGNOSIS — E782 Mixed hyperlipidemia: Secondary | ICD-10-CM | POA: Diagnosis not present

## 2017-07-26 DIAGNOSIS — Z794 Long term (current) use of insulin: Secondary | ICD-10-CM | POA: Diagnosis not present

## 2017-07-26 DIAGNOSIS — F331 Major depressive disorder, recurrent, moderate: Secondary | ICD-10-CM | POA: Diagnosis not present

## 2017-07-26 DIAGNOSIS — F325 Major depressive disorder, single episode, in full remission: Secondary | ICD-10-CM | POA: Diagnosis not present

## 2017-07-26 DIAGNOSIS — G459 Transient cerebral ischemic attack, unspecified: Secondary | ICD-10-CM | POA: Diagnosis not present

## 2017-07-26 DIAGNOSIS — E113293 Type 2 diabetes mellitus with mild nonproliferative diabetic retinopathy without macular edema, bilateral: Secondary | ICD-10-CM | POA: Diagnosis not present

## 2017-07-26 NOTE — Telephone Encounter (Signed)
Pt calling because he was hoping to get samples of his Dofetilide.  Pt is prescribed this medication from his EP doctor at [facility].  States they recently decreased his dose to 270mcg BID and now his prescription is in limbo because [facility] and a 90 day to [facility].  Pt almost out of meds.  Pt hoping we had samples or knew of something he could do to get medication to tie him over until he could get his prescription straightened out. Advised we did not have samples.  Also made him aware that he could speak with his pharmacy and see about getting a few days supply to hold him over.  Spoke with Jinny Blossom, [facility] to see if other options and she said no, just the ones provided.  Attempted to call pt back to let him know and had to leave a message.

## 2017-07-26 NOTE — Telephone Encounter (Signed)
Spoke with pt and made him aware of info given by [facility].  Pt appreciative for assistance.

## 2017-07-26 NOTE — Telephone Encounter (Signed)
Mr.Marseille is calling because he has a question about his medication that was prescribe from [facility] . Please call

## 2017-07-31 ENCOUNTER — Ambulatory Visit (INDEPENDENT_AMBULATORY_CARE_PROVIDER_SITE_OTHER): Payer: Medicare Other

## 2017-07-31 DIAGNOSIS — G459 Transient cerebral ischemic attack, unspecified: Secondary | ICD-10-CM | POA: Diagnosis not present

## 2017-07-31 DIAGNOSIS — I480 Paroxysmal atrial fibrillation: Secondary | ICD-10-CM

## 2017-07-31 DIAGNOSIS — Z5181 Encounter for therapeutic drug level monitoring: Secondary | ICD-10-CM | POA: Diagnosis not present

## 2017-07-31 LAB — POCT INR: INR: 3.1

## 2017-07-31 NOTE — Patient Instructions (Signed)
Eat greens today and continue current dose of 1 tablet (5mg ) daily except 1 and 1/2 tablets (7.5mg ) on Mondays, Wednesdays, and Fridays. Follow-up in 1 week. Call clinic with any concerns [phone]

## 2017-08-07 ENCOUNTER — Ambulatory Visit (INDEPENDENT_AMBULATORY_CARE_PROVIDER_SITE_OTHER): Payer: Medicare Other | Admitting: *Deleted

## 2017-08-07 DIAGNOSIS — G459 Transient cerebral ischemic attack, unspecified: Secondary | ICD-10-CM

## 2017-08-07 DIAGNOSIS — Z5181 Encounter for therapeutic drug level monitoring: Secondary | ICD-10-CM

## 2017-08-07 DIAGNOSIS — I480 Paroxysmal atrial fibrillation: Secondary | ICD-10-CM

## 2017-08-07 LAB — POCT INR: INR: 3.7

## 2017-08-07 MED ORDER — WARFARIN SODIUM 5 MG PO TABS: 5.0000 mg | ORAL_TABLET | ORAL | 2 refills | Status: DC

## 2017-08-07 NOTE — Patient Instructions (Signed)
Skip tomorrow's dose, then change your dose to 1 tablet daily except 1.5 tablets on Mondays and Fridays.  Follow-up in 1 week. Call clinic with any concerns [phone]

## 2017-08-09 ENCOUNTER — Other Ambulatory Visit: Payer: Self-pay | Admitting: Interventional Cardiology

## 2017-08-16 ENCOUNTER — Ambulatory Visit (INDEPENDENT_AMBULATORY_CARE_PROVIDER_SITE_OTHER): Payer: Medicare Other | Admitting: *Deleted

## 2017-08-16 DIAGNOSIS — Z5181 Encounter for therapeutic drug level monitoring: Secondary | ICD-10-CM | POA: Diagnosis not present

## 2017-08-16 DIAGNOSIS — G459 Transient cerebral ischemic attack, unspecified: Secondary | ICD-10-CM

## 2017-08-16 DIAGNOSIS — I480 Paroxysmal atrial fibrillation: Secondary | ICD-10-CM

## 2017-08-16 LAB — POCT INR: INR: 1.8

## 2017-08-16 NOTE — Patient Instructions (Signed)
Continue taking the dose you have been taken which is 1 tablet daily except 1.5 tablets on Mondays, Wednesdays,  and Fridays.  Follow-up in 1 week. Call clinic with any concerns [phone]

## 2017-08-21 DIAGNOSIS — I481 Persistent atrial fibrillation: Secondary | ICD-10-CM | POA: Diagnosis not present

## 2017-08-21 DIAGNOSIS — I480 Paroxysmal atrial fibrillation: Secondary | ICD-10-CM | POA: Diagnosis not present

## 2017-08-21 DIAGNOSIS — I472 Ventricular tachycardia: Secondary | ICD-10-CM | POA: Diagnosis not present

## 2017-08-21 DIAGNOSIS — Z79899 Other long term (current) drug therapy: Secondary | ICD-10-CM | POA: Diagnosis not present

## 2017-08-21 DIAGNOSIS — I441 Atrioventricular block, second degree: Secondary | ICD-10-CM | POA: Diagnosis not present

## 2017-08-21 DIAGNOSIS — Z45018 Encounter for adjustment and management of other part of cardiac pacemaker: Secondary | ICD-10-CM | POA: Diagnosis not present

## 2017-08-21 DIAGNOSIS — Z7901 Long term (current) use of anticoagulants: Secondary | ICD-10-CM | POA: Diagnosis not present

## 2017-08-21 DIAGNOSIS — Z950 Presence of cardiac pacemaker: Secondary | ICD-10-CM | POA: Diagnosis not present

## 2017-08-23 ENCOUNTER — Ambulatory Visit (INDEPENDENT_AMBULATORY_CARE_PROVIDER_SITE_OTHER): Payer: Medicare Other

## 2017-08-23 DIAGNOSIS — Z5181 Encounter for therapeutic drug level monitoring: Secondary | ICD-10-CM

## 2017-08-23 DIAGNOSIS — I480 Paroxysmal atrial fibrillation: Secondary | ICD-10-CM

## 2017-08-23 DIAGNOSIS — G459 Transient cerebral ischemic attack, unspecified: Secondary | ICD-10-CM

## 2017-08-23 LAB — POCT INR: INR: 2.9

## 2017-08-23 NOTE — Patient Instructions (Signed)
Continue on same dosage 1 tablet daily except 1.5 tablets on Mondays, Wednesdays,  and Fridays.  Follow-up in 1 week. Call clinic with any concerns [phone]

## 2017-09-01 ENCOUNTER — Ambulatory Visit (INDEPENDENT_AMBULATORY_CARE_PROVIDER_SITE_OTHER): Payer: Medicare Other | Admitting: *Deleted

## 2017-09-01 DIAGNOSIS — G459 Transient cerebral ischemic attack, unspecified: Secondary | ICD-10-CM

## 2017-09-01 DIAGNOSIS — I480 Paroxysmal atrial fibrillation: Secondary | ICD-10-CM | POA: Diagnosis not present

## 2017-09-01 DIAGNOSIS — Z5181 Encounter for therapeutic drug level monitoring: Secondary | ICD-10-CM | POA: Diagnosis not present

## 2017-09-01 LAB — POCT INR: INR: 2.3

## 2017-09-01 NOTE — Patient Instructions (Signed)
Description   Continue on same dosage 1 tablet daily except 1.5 tablets on Mondays, Wednesdays,  and Fridays.  Follow-up in 1 week. Call clinic with any concerns [phone]

## 2017-09-07 DIAGNOSIS — Z8501 Personal history of malignant neoplasm of esophagus: Secondary | ICD-10-CM | POA: Diagnosis not present

## 2017-09-07 DIAGNOSIS — K8689 Other specified diseases of pancreas: Secondary | ICD-10-CM | POA: Diagnosis not present

## 2017-09-07 DIAGNOSIS — R918 Other nonspecific abnormal finding of lung field: Secondary | ICD-10-CM | POA: Diagnosis not present

## 2017-09-07 DIAGNOSIS — C159 Malignant neoplasm of esophagus, unspecified: Secondary | ICD-10-CM | POA: Diagnosis not present

## 2017-09-07 DIAGNOSIS — Z08 Encounter for follow-up examination after completed treatment for malignant neoplasm: Secondary | ICD-10-CM | POA: Diagnosis not present

## 2017-09-08 ENCOUNTER — Ambulatory Visit (INDEPENDENT_AMBULATORY_CARE_PROVIDER_SITE_OTHER): Payer: Medicare Other | Admitting: *Deleted

## 2017-09-08 DIAGNOSIS — Z5181 Encounter for therapeutic drug level monitoring: Secondary | ICD-10-CM | POA: Diagnosis not present

## 2017-09-08 DIAGNOSIS — I480 Paroxysmal atrial fibrillation: Secondary | ICD-10-CM

## 2017-09-08 DIAGNOSIS — G459 Transient cerebral ischemic attack, unspecified: Secondary | ICD-10-CM

## 2017-09-08 LAB — POCT INR: INR: 3.3

## 2017-09-08 NOTE — Patient Instructions (Signed)
Description   Do not take tomorrow's dose then continue on same dosage 1 tablet daily except 1.5 tablets on Mondays, Wednesdays,  and Fridays.  Follow-up in 1 week. Call clinic with any concerns [phone]

## 2017-09-15 ENCOUNTER — Ambulatory Visit (INDEPENDENT_AMBULATORY_CARE_PROVIDER_SITE_OTHER): Payer: Medicare Other | Admitting: *Deleted

## 2017-09-15 DIAGNOSIS — Z5181 Encounter for therapeutic drug level monitoring: Secondary | ICD-10-CM

## 2017-09-15 DIAGNOSIS — R9389 Abnormal findings on diagnostic imaging of other specified body structures: Secondary | ICD-10-CM | POA: Diagnosis not present

## 2017-09-15 DIAGNOSIS — G459 Transient cerebral ischemic attack, unspecified: Secondary | ICD-10-CM | POA: Diagnosis not present

## 2017-09-15 DIAGNOSIS — I480 Paroxysmal atrial fibrillation: Secondary | ICD-10-CM

## 2017-09-15 LAB — POCT INR: INR: 2

## 2017-09-15 NOTE — Patient Instructions (Signed)
Description   Continue on same dosage 1 tablet daily except 1.5 tablets on Mondays, Wednesdays,  and Fridays.  Follow-up in 1 week. Call clinic with any concerns [phone]

## 2017-09-22 ENCOUNTER — Ambulatory Visit (INDEPENDENT_AMBULATORY_CARE_PROVIDER_SITE_OTHER): Payer: Medicare Other | Admitting: *Deleted

## 2017-09-22 DIAGNOSIS — G459 Transient cerebral ischemic attack, unspecified: Secondary | ICD-10-CM | POA: Diagnosis not present

## 2017-09-22 DIAGNOSIS — Z8679 Personal history of other diseases of the circulatory system: Secondary | ICD-10-CM | POA: Diagnosis not present

## 2017-09-22 DIAGNOSIS — Z5181 Encounter for therapeutic drug level monitoring: Secondary | ICD-10-CM | POA: Diagnosis not present

## 2017-09-22 DIAGNOSIS — I480 Paroxysmal atrial fibrillation: Secondary | ICD-10-CM

## 2017-09-22 DIAGNOSIS — Z9889 Other specified postprocedural states: Secondary | ICD-10-CM

## 2017-09-22 LAB — POCT INR: INR: 2

## 2017-09-22 NOTE — Patient Instructions (Signed)
Description   Today Friday Jan 18th take 2 tablets as he did not take coumadin yesterday then continue on same dosage 1 tablet daily except 1.5 tablets on Mondays, Wednesdays,  and Fridays.  Follow-up in 2 weeks. Call clinic with any concerns [phone]

## 2017-10-04 DIAGNOSIS — H40013 Open angle with borderline findings, low risk, bilateral: Secondary | ICD-10-CM | POA: Diagnosis not present

## 2017-10-04 DIAGNOSIS — E113293 Type 2 diabetes mellitus with mild nonproliferative diabetic retinopathy without macular edema, bilateral: Secondary | ICD-10-CM | POA: Diagnosis not present

## 2017-10-04 DIAGNOSIS — H5201 Hypermetropia, right eye: Secondary | ICD-10-CM | POA: Diagnosis not present

## 2017-10-04 DIAGNOSIS — H353132 Nonexudative age-related macular degeneration, bilateral, intermediate dry stage: Secondary | ICD-10-CM | POA: Diagnosis not present

## 2017-10-04 DIAGNOSIS — H52222 Regular astigmatism, left eye: Secondary | ICD-10-CM | POA: Diagnosis not present

## 2017-10-04 DIAGNOSIS — H2513 Age-related nuclear cataract, bilateral: Secondary | ICD-10-CM | POA: Diagnosis not present

## 2017-10-04 DIAGNOSIS — H524 Presbyopia: Secondary | ICD-10-CM | POA: Diagnosis not present

## 2017-10-05 ENCOUNTER — Ambulatory Visit (INDEPENDENT_AMBULATORY_CARE_PROVIDER_SITE_OTHER): Payer: Medicare Other

## 2017-10-05 DIAGNOSIS — I480 Paroxysmal atrial fibrillation: Secondary | ICD-10-CM

## 2017-10-05 DIAGNOSIS — Z5181 Encounter for therapeutic drug level monitoring: Secondary | ICD-10-CM

## 2017-10-05 DIAGNOSIS — G459 Transient cerebral ischemic attack, unspecified: Secondary | ICD-10-CM | POA: Diagnosis not present

## 2017-10-05 LAB — POCT INR: INR: 2.5

## 2017-10-05 NOTE — Patient Instructions (Signed)
Description   Continue on same dosage 1 tablet daily except 1.5 tablets on Mondays, Wednesdays, and Fridays.  Recheck in 4 weeks. Call clinic with any concerns [phone]     

## 2017-10-11 DIAGNOSIS — E1142 Type 2 diabetes mellitus with diabetic polyneuropathy: Secondary | ICD-10-CM | POA: Diagnosis not present

## 2017-10-11 DIAGNOSIS — K219 Gastro-esophageal reflux disease without esophagitis: Secondary | ICD-10-CM | POA: Diagnosis not present

## 2017-10-11 DIAGNOSIS — Z794 Long term (current) use of insulin: Secondary | ICD-10-CM | POA: Diagnosis not present

## 2017-10-11 DIAGNOSIS — I10 Essential (primary) hypertension: Secondary | ICD-10-CM | POA: Diagnosis not present

## 2017-10-11 DIAGNOSIS — E113299 Type 2 diabetes mellitus with mild nonproliferative diabetic retinopathy without macular edema, unspecified eye: Secondary | ICD-10-CM | POA: Diagnosis not present

## 2017-10-11 DIAGNOSIS — Z7984 Long term (current) use of oral hypoglycemic drugs: Secondary | ICD-10-CM | POA: Diagnosis not present

## 2017-10-18 ENCOUNTER — Other Ambulatory Visit: Payer: Self-pay | Admitting: Interventional Cardiology

## 2017-10-25 ENCOUNTER — Other Ambulatory Visit: Payer: Self-pay | Admitting: Interventional Cardiology

## 2017-11-02 ENCOUNTER — Ambulatory Visit (INDEPENDENT_AMBULATORY_CARE_PROVIDER_SITE_OTHER): Payer: Medicare Other

## 2017-11-02 DIAGNOSIS — I480 Paroxysmal atrial fibrillation: Secondary | ICD-10-CM | POA: Diagnosis not present

## 2017-11-02 DIAGNOSIS — Z5181 Encounter for therapeutic drug level monitoring: Secondary | ICD-10-CM | POA: Diagnosis not present

## 2017-11-02 DIAGNOSIS — G459 Transient cerebral ischemic attack, unspecified: Secondary | ICD-10-CM

## 2017-11-02 LAB — POCT INR: INR: 3.3

## 2017-11-02 NOTE — Patient Instructions (Signed)
Description   Skip tomorrow's dosage of Coumadin, then resume same dosage 1 tablet daily except 1.5 tablets on Mondays, Wednesdays, and Fridays.  Recheck in 4 weeks. Call clinic with any concerns [phone]

## 2017-11-13 DIAGNOSIS — R159 Full incontinence of feces: Secondary | ICD-10-CM | POA: Diagnosis not present

## 2017-11-13 DIAGNOSIS — R152 Fecal urgency: Secondary | ICD-10-CM | POA: Diagnosis not present

## 2017-12-01 ENCOUNTER — Ambulatory Visit (INDEPENDENT_AMBULATORY_CARE_PROVIDER_SITE_OTHER): Payer: Medicare Other | Admitting: *Deleted

## 2017-12-01 DIAGNOSIS — G459 Transient cerebral ischemic attack, unspecified: Secondary | ICD-10-CM | POA: Diagnosis not present

## 2017-12-01 DIAGNOSIS — Z950 Presence of cardiac pacemaker: Secondary | ICD-10-CM | POA: Diagnosis not present

## 2017-12-01 DIAGNOSIS — I472 Ventricular tachycardia: Secondary | ICD-10-CM | POA: Diagnosis not present

## 2017-12-01 DIAGNOSIS — Z5181 Encounter for therapeutic drug level monitoring: Secondary | ICD-10-CM | POA: Diagnosis not present

## 2017-12-01 DIAGNOSIS — I480 Paroxysmal atrial fibrillation: Secondary | ICD-10-CM | POA: Diagnosis not present

## 2017-12-01 DIAGNOSIS — Z45018 Encounter for adjustment and management of other part of cardiac pacemaker: Secondary | ICD-10-CM | POA: Diagnosis not present

## 2017-12-01 LAB — POCT INR: INR: 3.1

## 2017-12-01 NOTE — Patient Instructions (Signed)
Description   Do not take coumadin tomorrow March 30th as he has taken today then change coumadin dose to 1 tablet daily except 1.5 tablets only on Mondays and Fridays.  Recheck in 2 weeks. Call clinic with any concerns [phone]

## 2017-12-15 ENCOUNTER — Ambulatory Visit (INDEPENDENT_AMBULATORY_CARE_PROVIDER_SITE_OTHER): Payer: Medicare Other | Admitting: *Deleted

## 2017-12-15 DIAGNOSIS — G459 Transient cerebral ischemic attack, unspecified: Secondary | ICD-10-CM | POA: Diagnosis not present

## 2017-12-15 DIAGNOSIS — Z5181 Encounter for therapeutic drug level monitoring: Secondary | ICD-10-CM

## 2017-12-15 DIAGNOSIS — I480 Paroxysmal atrial fibrillation: Secondary | ICD-10-CM

## 2017-12-15 LAB — POCT INR: INR: 1.6

## 2017-12-15 NOTE — Patient Instructions (Signed)
Description   Today take another 1/2 tablet then continue taking the dose you have been taking, which is 1 tablet daily except 1.5 tablets on Mondays, Wednesdays, and Fridays.  Recheck in 2 weeks. Call clinic with any concerns [phone]

## 2018-01-01 ENCOUNTER — Ambulatory Visit (INDEPENDENT_AMBULATORY_CARE_PROVIDER_SITE_OTHER): Payer: Medicare Other | Admitting: Pharmacist

## 2018-01-01 DIAGNOSIS — Z5181 Encounter for therapeutic drug level monitoring: Secondary | ICD-10-CM

## 2018-01-01 DIAGNOSIS — G459 Transient cerebral ischemic attack, unspecified: Secondary | ICD-10-CM

## 2018-01-01 DIAGNOSIS — I480 Paroxysmal atrial fibrillation: Secondary | ICD-10-CM

## 2018-01-01 LAB — POCT INR: INR: 2.2

## 2018-01-01 NOTE — Patient Instructions (Signed)
Description   Continue taking the dose you have been taking, which is 1 tablet daily except 1.5 tablets on Mondays, Wednesdays, and Fridays.  Recheck in 3 weeks. Call clinic with any concerns [phone]

## 2018-01-10 ENCOUNTER — Other Ambulatory Visit: Payer: Self-pay | Admitting: Interventional Cardiology

## 2018-01-20 ENCOUNTER — Other Ambulatory Visit: Payer: Self-pay | Admitting: Interventional Cardiology

## 2018-01-22 ENCOUNTER — Ambulatory Visit (INDEPENDENT_AMBULATORY_CARE_PROVIDER_SITE_OTHER): Payer: Medicare Other | Admitting: *Deleted

## 2018-01-22 DIAGNOSIS — I480 Paroxysmal atrial fibrillation: Secondary | ICD-10-CM | POA: Diagnosis not present

## 2018-01-22 DIAGNOSIS — G459 Transient cerebral ischemic attack, unspecified: Secondary | ICD-10-CM

## 2018-01-22 DIAGNOSIS — Z5181 Encounter for therapeutic drug level monitoring: Secondary | ICD-10-CM | POA: Diagnosis not present

## 2018-01-22 LAB — POCT INR: INR: 2.4

## 2018-01-22 NOTE — Patient Instructions (Addendum)
Description   Continue taking 1 tablet daily except 1.5 tablets on Mondays, Wednesdays, and Fridays.  Recheck in 4 weeks. Call clinic with any concerns [phone]

## 2018-02-19 DIAGNOSIS — Z45018 Encounter for adjustment and management of other part of cardiac pacemaker: Secondary | ICD-10-CM | POA: Diagnosis not present

## 2018-02-19 DIAGNOSIS — Z950 Presence of cardiac pacemaker: Secondary | ICD-10-CM | POA: Diagnosis not present

## 2018-02-19 DIAGNOSIS — Z8679 Personal history of other diseases of the circulatory system: Secondary | ICD-10-CM | POA: Diagnosis not present

## 2018-02-19 DIAGNOSIS — R001 Bradycardia, unspecified: Secondary | ICD-10-CM | POA: Diagnosis not present

## 2018-02-19 DIAGNOSIS — I472 Ventricular tachycardia: Secondary | ICD-10-CM | POA: Diagnosis not present

## 2018-02-19 DIAGNOSIS — R05 Cough: Secondary | ICD-10-CM | POA: Diagnosis not present

## 2018-02-19 DIAGNOSIS — I959 Hypotension, unspecified: Secondary | ICD-10-CM | POA: Diagnosis not present

## 2018-02-19 DIAGNOSIS — Z7901 Long term (current) use of anticoagulants: Secondary | ICD-10-CM | POA: Diagnosis not present

## 2018-02-19 DIAGNOSIS — I481 Persistent atrial fibrillation: Secondary | ICD-10-CM | POA: Diagnosis not present

## 2018-02-20 ENCOUNTER — Ambulatory Visit (INDEPENDENT_AMBULATORY_CARE_PROVIDER_SITE_OTHER): Payer: Medicare Other

## 2018-02-20 DIAGNOSIS — Z5181 Encounter for therapeutic drug level monitoring: Secondary | ICD-10-CM | POA: Diagnosis not present

## 2018-02-20 DIAGNOSIS — G459 Transient cerebral ischemic attack, unspecified: Secondary | ICD-10-CM

## 2018-02-20 DIAGNOSIS — I480 Paroxysmal atrial fibrillation: Secondary | ICD-10-CM | POA: Diagnosis not present

## 2018-02-20 LAB — POCT INR: INR: 3.4 — AB (ref 2.0–3.0)

## 2018-02-20 NOTE — Patient Instructions (Signed)
Description   Skip tomorrow's dosage of Coumadin, then resume same dosage 1 tablet daily except 1.5 tablets on Mondays, Wednesdays, and Fridays.  Recheck in 3 weeks. Call clinic with any concerns [phone]

## 2018-02-23 DIAGNOSIS — K219 Gastro-esophageal reflux disease without esophagitis: Secondary | ICD-10-CM | POA: Diagnosis not present

## 2018-02-23 DIAGNOSIS — Z0000 Encounter for general adult medical examination without abnormal findings: Secondary | ICD-10-CM | POA: Diagnosis not present

## 2018-02-23 DIAGNOSIS — Z794 Long term (current) use of insulin: Secondary | ICD-10-CM | POA: Diagnosis not present

## 2018-02-23 DIAGNOSIS — E669 Obesity, unspecified: Secondary | ICD-10-CM | POA: Diagnosis not present

## 2018-02-23 DIAGNOSIS — F411 Generalized anxiety disorder: Secondary | ICD-10-CM | POA: Diagnosis not present

## 2018-02-23 DIAGNOSIS — N179 Acute kidney failure, unspecified: Secondary | ICD-10-CM | POA: Diagnosis not present

## 2018-02-23 DIAGNOSIS — F3341 Major depressive disorder, recurrent, in partial remission: Secondary | ICD-10-CM | POA: Diagnosis not present

## 2018-02-23 DIAGNOSIS — I4891 Unspecified atrial fibrillation: Secondary | ICD-10-CM | POA: Diagnosis not present

## 2018-02-23 DIAGNOSIS — F325 Major depressive disorder, single episode, in full remission: Secondary | ICD-10-CM | POA: Diagnosis not present

## 2018-02-23 DIAGNOSIS — E113299 Type 2 diabetes mellitus with mild nonproliferative diabetic retinopathy without macular edema, unspecified eye: Secondary | ICD-10-CM | POA: Diagnosis not present

## 2018-02-23 DIAGNOSIS — Z23 Encounter for immunization: Secondary | ICD-10-CM | POA: Diagnosis not present

## 2018-02-23 DIAGNOSIS — I10 Essential (primary) hypertension: Secondary | ICD-10-CM | POA: Diagnosis not present

## 2018-02-23 DIAGNOSIS — Z1389 Encounter for screening for other disorder: Secondary | ICD-10-CM | POA: Diagnosis not present

## 2018-02-23 DIAGNOSIS — R6882 Decreased libido: Secondary | ICD-10-CM | POA: Diagnosis not present

## 2018-02-23 DIAGNOSIS — E1142 Type 2 diabetes mellitus with diabetic polyneuropathy: Secondary | ICD-10-CM | POA: Diagnosis not present

## 2018-02-23 DIAGNOSIS — E782 Mixed hyperlipidemia: Secondary | ICD-10-CM | POA: Diagnosis not present

## 2018-02-28 DIAGNOSIS — E113299 Type 2 diabetes mellitus with mild nonproliferative diabetic retinopathy without macular edema, unspecified eye: Secondary | ICD-10-CM | POA: Diagnosis not present

## 2018-02-28 DIAGNOSIS — Z794 Long term (current) use of insulin: Secondary | ICD-10-CM | POA: Diagnosis not present

## 2018-03-06 DIAGNOSIS — C159 Malignant neoplasm of esophagus, unspecified: Secondary | ICD-10-CM | POA: Diagnosis not present

## 2018-03-06 DIAGNOSIS — R918 Other nonspecific abnormal finding of lung field: Secondary | ICD-10-CM | POA: Diagnosis not present

## 2018-03-07 DIAGNOSIS — N179 Acute kidney failure, unspecified: Secondary | ICD-10-CM | POA: Diagnosis not present

## 2018-03-14 ENCOUNTER — Ambulatory Visit (INDEPENDENT_AMBULATORY_CARE_PROVIDER_SITE_OTHER): Payer: Medicare Other | Admitting: Interventional Cardiology

## 2018-03-14 ENCOUNTER — Ambulatory Visit (INDEPENDENT_AMBULATORY_CARE_PROVIDER_SITE_OTHER): Payer: Medicare Other | Admitting: *Deleted

## 2018-03-14 ENCOUNTER — Encounter: Payer: Self-pay | Admitting: Interventional Cardiology

## 2018-03-14 VITALS — BP 94/62 | HR 64 | Ht 73.0 in | Wt 234.1 lb

## 2018-03-14 DIAGNOSIS — Z5181 Encounter for therapeutic drug level monitoring: Secondary | ICD-10-CM

## 2018-03-14 DIAGNOSIS — G459 Transient cerebral ischemic attack, unspecified: Secondary | ICD-10-CM

## 2018-03-14 DIAGNOSIS — I480 Paroxysmal atrial fibrillation: Secondary | ICD-10-CM

## 2018-03-14 DIAGNOSIS — Z8679 Personal history of other diseases of the circulatory system: Secondary | ICD-10-CM

## 2018-03-14 DIAGNOSIS — Z7901 Long term (current) use of anticoagulants: Secondary | ICD-10-CM | POA: Diagnosis not present

## 2018-03-14 DIAGNOSIS — I10 Essential (primary) hypertension: Secondary | ICD-10-CM

## 2018-03-14 DIAGNOSIS — Z9889 Other specified postprocedural states: Secondary | ICD-10-CM | POA: Diagnosis not present

## 2018-03-14 LAB — POCT INR: INR: 2 (ref 2.0–3.0)

## 2018-03-14 MED ORDER — METOPROLOL TARTRATE 25 MG PO TABS: 25.0000 mg | ORAL_TABLET | Freq: Two times a day (BID) | ORAL | 3 refills | Status: DC

## 2018-03-14 NOTE — Patient Instructions (Signed)
Description   Continue same dosage 1 tablet daily except 1.5 tablets on Mondays, Wednesdays, and Fridays.  Recheck in 4 weeks. Call clinic with any concerns [phone]

## 2018-03-14 NOTE — Progress Notes (Signed)
Cardiology Office Note    Date:  03/14/2018   ID:  Lawrence [name]., DOB 10/22/1950, MRN [id number]  PCP:  [name], MD  Cardiologist: [name], MD   Chief Complaint  Patient presents with  . Atrial Fibrillation  . Anticoagulation    History of Present Illness:  Lawrence [name]. is a 67 y.o. male malefor f/u atrial fibrillation (Dr.Daubert at [facility]) , dofetilide therapy, chronic anticoagulation, hypertension, TIA, and hyperlipidemia.. Prior history of esophagectomy for esophageal cancer 2013.  He had atrial fibrillation at [facility] in 2018 and has had no subsequent atrial fib.  Dofetilide has been discontinued since November 2018.  Asymptomatic.  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  He walks over 8 miles per week.  He denies chest discomfort, dyspnea, lower extremity swelling, palpitations, and syncope.  Bleeding on Coumadin.  Device is followed in the pacer clinic at [facility].   Past Medical History:  Diagnosis Date  . Bradycardia   . GERD (gastroesophageal reflux disease)   . H/O hiatal hernia    s/p  repair 06-26-2012  . History of esophagectomy 08-21-2012  AT [facility]  . History of kidney stones 2006  . History of malignant neoplasm of esophagus ONCOLOGIST-  DR D'AMICO AT [facility]-- PER LAST NOTE 01/ 2018  NO RECURRENCE   dx 10/ 2013  Stage 2A (T2 N0)  08-21-2012 s/p  esophagectomy w/ gastric pull-through [facility] post residual recurrent anastomotic stricture (multiple EGD w/ dilations)  . History of transient ischemic attack (TIA) 2003   no residual  . Hypertension   . Long term (current) use of anticoagulants    xarelto  . On dofetilide therapy   . PAF (paroxysmal atrial fibrillation) [facility]) first dx 2009   primary cardiologist-  dr Daneen Schick /  EP cardiologist -- dr Lurene Shadow ([facility])  s/p  afib ablation 08-02-2016  . Pancreatic cyst    x2   . Peyronie's disease   . Presence of permanent cardiac pacemaker implanted 05-01-2014  at [facility] follows  . Pulmonary nodule, right    upper and middle lobes --  per last CT stable  . Type 2 diabetes mellitus treated with insulin ([place])   . Wears glasses     Past Surgical History:  Procedure Laterality Date  . ANTERIOR CERVICAL DECOMP/DISCECTOMY FUSION N/A 06/02/2016   Procedure: ANTERIOR CERVICAL DECOMPRESSION FUSION CERVICAL 5-6, CERVICAL 6-7 WITH INSTRUMENTATION AND ALLOGRAFT;  Surgeon: Phylliss Bob, MD;  Location: [place];  Service: Orthopedics;  Laterality: N/A;  ANTERIOR CERVICAL DECOMPRESSION FUSION CERVICAL 5-6, CERVICAL 6-7 WITH INSTRUMENTATION AND ALLOGRAFT  . BIOPSY  06/26/2012   Procedure: BIOPSY;  Surgeon: Madilyn Hook, DO;  Location: [facility];  Service: General;;  . CARDIAC ELECTROPHYSIOLOGY STUDY AND ABLATION  08-02-2016   [facility]   (dr Lurene Shadow)   pulmonary veins isolation and ablation afib  . CARDIAC PACEMAKER PLACEMENT  05-01-2014   [facility]  . CHOLECYSTECTOMY  1988  . COLONOSCOPY WITH PROPOFOL N/A 07/13/2015   Procedure: COLONOSCOPY WITH PROPOFOL;  Surgeon: Garlan Fair, MD;  Location: [facility];  Service: Endoscopy;  Laterality: N/A;  . ESOPHAGECTOMY  08-21-2012   [facility]   w/ gastric pull-through  . ESOPHAGOGASTRODUODENOSCOPY (EGD) WITH ESOPHAGEAL DILATION  multiple x32 per pt approx.--- last one 02-09-2015   recurrent anastomotic stricture post esophagectomy  . HIATAL HERNIA REPAIR  06/26/2012  Procedure: LAPAROSCOPIC REPAIR OF HIATAL HERNIA;  Surgeon: Madilyn Hook, DO;  [facility];  Service: General;;  . Fatima Blank HERNIA REPAIR  06/26/2012   Procedure: HERNIA REPAIR INCISIONAL;  Surgeon: Madilyn Hook, DO;  Location: [facility];  Service: General;;  . INGUINAL HERNIA REPAIR Bilateral 1987  . JEJUNOSTOMY FEEDING TUBE  12/22/2012   Removed 12/ 2016  approx.  Marland Kitchen KNEE ARTHROSCOPY Left 1990  . MASS EXCISION Left 04/04/2013   Procedure:  EXCISION OF SCALP MASS;  Surgeon: Madilyn Hook, DO;  Location: [facility];  Service: General;  Laterality: Left;   pilomatrixoma  . NESBIT PROCEDURE N/A 12/09/2016   Procedure: NESBIT PROCEDURE 16 DOT PLICATION;  Surgeon: Kathie Rhodes, MD;  Location: [facility];  Service: Urology;  Laterality: N/A;  . TONSILLECTOMY  1960  . TRANSTHORACIC ECHOCARDIOGRAM  08-06-2015   [facility]   mild LVH, ef 50%/  mild RAE /  moderate LAE/  trivial AR, MR, PR,  and TR    Current Medications: Outpatient Medications Prior to Visit  Medication Sig Dispense Refill  . amLODipine (NORVASC) 10 MG tablet Take 10 mg by mouth daily.    . chlorthalidone (HYGROTON) 25 MG tablet Take 25 mg by mouth daily.    Marland Kitchen FLUoxetine (PROZAC) 40 MG capsule Take 40 mg by mouth every morning.    Marland Kitchen LANTUS 100 UNIT/ML injection Inject 12 Units into the skin every morning.   3  . lisinopril (PRINIVIL,ZESTRIL) 40 MG tablet Take 40 mg by mouth daily.    Marland Kitchen LORazepam (ATIVAN) 1 MG tablet Take 1 mg by mouth at bedtime.    . magnesium oxide (MAG-OX) 400 MG tablet Take 400 mg by mouth daily.    . pantoprazole (PROTONIX) 40 MG tablet Take 40 mg by mouth daily.    . ranitidine (ZANTAC) 150 MG tablet Take 150 mg by mouth as needed.     . traMADol (ULTRAM) 50 MG tablet Take 50 mg by mouth 2 (two) times daily as needed for moderate pain.    . vitamin B-12 (CYANOCOBALAMIN) 1000 MCG tablet Take 1,000 mcg by mouth every other day. Taking Mon, Wed, Fri    . warfarin (COUMADIN) 5 MG tablet TAKE 1 TO 1.5 TABLETS DAILY AS DIRECTED 40 tablet 3  . metoprolol tartrate (LOPRESSOR) 25 MG tablet Take 1 tablet (25 mg total) by mouth 2 (two) times daily. Please make overdue appt with Dr. Tamala Julian before anymore refills. 2nd attempt 30 tablet 0  . amLODipine (NORVASC) 5 MG tablet Take 10 mg by mouth every morning.   11  . lisinopril (PRINIVIL,ZESTRIL) 20 MG tablet Take 40 mg by mouth every morning.   3  . metFORMIN (GLUCOPHAGE) 500 MG tablet Take by mouth 2 (two)  times daily with a meal.    . traMADol (ULTRAM) 50 MG tablet Take 50 mg by mouth every 12 (twelve) hours as needed.     No facility-administered medications prior to visit.      Allergies:   Patient has no known allergies.   Social History   Socioeconomic History  . Marital status: Married    Spouse name: Not on file  . Number of children: Not on file  . Years of education: Not on file  . Highest education level: Not on file  Occupational History  . Not on file  Social Needs  . Financial resource strain: Not on file  . Food insecurity:    Worry: Not on file    Inability: Not on file  .  Transportation needs:    Medical: Not on file    Non-medical: Not on file  Tobacco Use  . Smoking status: Never Smoker  . Smokeless tobacco: Never Used  Substance and Sexual Activity  . Alcohol use: Yes    Comment: socially  . Drug use: No  . Sexual activity: Not on file  Lifestyle  . Physical activity:    Days per week: Not on file    Minutes per session: Not on file  . Stress: Not on file  Relationships  . Social connections:    Talks on phone: Not on file    Gets together: Not on file    Attends religious service: Not on file    Active member of club or organization: Not on file    Attends meetings of clubs or organizations: Not on file    Relationship status: Not on file  Other Topics Concern  . Not on file  Social History Narrative  . Not on file     Family History:  The patient's family history includes Cancer in his father.   ROS:   Please see the history of present illness.    Anxiety, easy bruising, and some reflux.  No medication side effects. All other systems reviewed and are negative.   PHYSICAL EXAM:   VS:  BP 94/62   Pulse 64   Ht 6\' 1"  (1.854 m)   Wt 234 lb 1.9 oz (106.2 kg)   BMI 30.89 kg/m    GEN: Well nourished, well developed, in no acute distress  HEENT: normal  Neck: no JVD, carotid bruits, or masses Cardiac: RRR; no murmurs, rubs, or  gallops,no edema  Respiratory:  clear to auscultation bilaterally, normal work of breathing GI: soft, nontender, nondistended, + BS MS: no deformity or atrophy  Skin: warm and dry, no rash Neuro:  Alert and Oriented x 3, Strength and sensation are intact Psych: euthymic mood, full affect  Wt Readings from Last 3 Encounters:  03/14/18 234 lb 1.9 oz (106.2 kg)  12/29/16 233 lb 12.8 oz (106.1 kg)  12/09/16 238 lb (108 kg)      Studies/Labs Reviewed:   EKG:  EKG paced rhythm.  Otherwise normal with the exception of nonspecific T wave abnormality  Recent Labs: No results found for requested labs within last 8760 hours.   Lipid Panel    Component Value Date/Time   CHOL 157 04/13/2012 0825   TRIG 103 04/13/2012 0825   HDL 40 04/13/2012 0825   CHOLHDL 3.9 04/13/2012 0825   VLDL 21 04/13/2012 0825   LDLCALC 96 04/13/2012 0825    Additional studies/ records that were reviewed today include:  None    ASSESSMENT:    1. Paroxysmal atrial fibrillation (HCC)   2. TIA (transient ischemic attack)   3. Long term (current) use of anticoagulants   4. Hypertension, benign   5. S/P ablation of atrial fibrillation      PLAN:  In order of problems listed above:  1. No significant recurrence since ablation.  Dofetilide has been discontinued. 2. She has elevated Chads VASC greater than 2 especially given prior TIA.  Will need continuous long-term anticoagulation therapy. 3. Followed in our anticoagulation clinic. 4. Target less than 130/80 mmHg.  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  States that some of the patient's he helps weigh more than 500 pounds. 5. Apparently successful.  Performed by Dr. Kayleen Memos at [facility].  Clinical follow-up in 1 year.  Call if palpitations, edema, or dyspnea.    Medication Adjustments/Labs and Tests Ordered: Current medicines are  reviewed at length with the patient today.  Concerns regarding medicines are outlined above.  Medication changes, Labs and Tests ordered today are listed in the Patient Instructions below. Patient Instructions  Medication Instructions:  Your physician recommends that you continue on your current medications as directed. Please refer to the Current Medication list given to you today.  Labwork: None  Testing/Procedures: None  Follow-Up: Your physician wants you to follow-up in: 1 year with Dr. Tamala Julian.  You will receive a reminder letter in the mail two months in advance. If you don't receive a letter, please call our office to schedule the follow-up appointment.   Any Other Special Instructions Will Be Listed Below (If Applicable).     If you need a refill on your cardiac medications before your next appointment, please call your pharmacy.      Signed, [name], MD  03/14/2018 9:52 AM    [facility] [address], [place], [place]  [address] Phone: [phone]; Fax: [phone]

## 2018-03-14 NOTE — Patient Instructions (Signed)

## 2018-03-16 DIAGNOSIS — F411 Generalized anxiety disorder: Secondary | ICD-10-CM | POA: Diagnosis not present

## 2018-04-10 DIAGNOSIS — F411 Generalized anxiety disorder: Secondary | ICD-10-CM | POA: Diagnosis not present

## 2018-04-10 DIAGNOSIS — F332 Major depressive disorder, recurrent severe without psychotic features: Secondary | ICD-10-CM | POA: Diagnosis not present

## 2018-04-12 DIAGNOSIS — I10 Essential (primary) hypertension: Secondary | ICD-10-CM | POA: Diagnosis not present

## 2018-04-16 ENCOUNTER — Ambulatory Visit (INDEPENDENT_AMBULATORY_CARE_PROVIDER_SITE_OTHER): Payer: Medicare Other | Admitting: *Deleted

## 2018-04-16 DIAGNOSIS — Z5181 Encounter for therapeutic drug level monitoring: Secondary | ICD-10-CM

## 2018-04-16 DIAGNOSIS — I480 Paroxysmal atrial fibrillation: Secondary | ICD-10-CM | POA: Diagnosis not present

## 2018-04-16 DIAGNOSIS — G459 Transient cerebral ischemic attack, unspecified: Secondary | ICD-10-CM

## 2018-04-16 LAB — POCT INR: INR: 1.5 — AB (ref 2.0–3.0)

## 2018-04-16 NOTE — Patient Instructions (Signed)
Description   Today take an extra 1/2 tablet, tomorrow take 1.5 tablets, then Continue same dosage 1 tablet daily except 1.5 tablets on Mondays, Wednesdays, and Fridays.  Recheck in 2 weeks. Call clinic with any concerns [phone]

## 2018-04-25 DIAGNOSIS — Z5181 Encounter for therapeutic drug level monitoring: Secondary | ICD-10-CM | POA: Diagnosis not present

## 2018-04-25 DIAGNOSIS — Z79899 Other long term (current) drug therapy: Secondary | ICD-10-CM | POA: Diagnosis not present

## 2018-04-30 ENCOUNTER — Ambulatory Visit (INDEPENDENT_AMBULATORY_CARE_PROVIDER_SITE_OTHER): Payer: Medicare Other | Admitting: *Deleted

## 2018-04-30 DIAGNOSIS — I480 Paroxysmal atrial fibrillation: Secondary | ICD-10-CM

## 2018-04-30 DIAGNOSIS — Z5181 Encounter for therapeutic drug level monitoring: Secondary | ICD-10-CM

## 2018-04-30 DIAGNOSIS — G459 Transient cerebral ischemic attack, unspecified: Secondary | ICD-10-CM | POA: Diagnosis not present

## 2018-04-30 LAB — POCT INR: INR: 1.7 — AB (ref 2.0–3.0)

## 2018-04-30 NOTE — Patient Instructions (Addendum)
Description   Today take 1.5 tablets then continue same dosage 1 tablet daily except 1.5 tablets on Mondays, Wednesdays, and Fridays.  Recheck in 3 weeks per pt as he will be out of town. Call clinic with any concerns [phone]

## 2018-05-02 ENCOUNTER — Telehealth: Payer: Self-pay | Admitting: *Deleted

## 2018-05-02 NOTE — Telephone Encounter (Signed)
   [facility] Pre-operative Risk Assessment    Request for surgical clearance:  1. What type of surgery is being performed? Periodontal Surgical Therapy, Possible Tooth Removal and/or Implant Placement  2. When is this surgery scheduled? Pending Clearance  3. What type of clearance is required (medical clearance vs. Pharmacy clearance to hold med vs. Both)? Both  4. Are there any medications that need to be held prior to surgery and how long? Coumadin, Pending Provider Advisory  5. Practice name and name of physician performing surgery? [facility],   [facility] L[facility]. [facility], DDS  6. What is your office phone number [phone]    7.   What is your office fax number [phone]  8.   Anesthesia type (None, local, MAC, general) ? Pending Clearance   Lawrence [name] 05/02/2018, 7:48 AM  _________________________________________________________________   (provider comments below)

## 2018-05-02 NOTE — Telephone Encounter (Signed)
How many teeth will be removed? Generally do not recommend holding anticoagulation unless >3 teeth removed.

## 2018-05-02 NOTE — Telephone Encounter (Addendum)
      Primary Cardiologist: [name], MD  Chart reviewed as part of pre-operative protocol coverage. Given past medical history and time since last visit, based on ACC/AHA guidelines, Lawrence [name]. would be at acceptable risk for the planned procedure without further cardiovascular testing.   Seen by Dr. Tamala Julian on 03/14/2018, walking over 8 miles a week volunteering at the [place] and having no ischemic or atrial fibrillation symptoms.  I have routed this to the pharmacy to address the Coumadin.  I will route this recommendation to the requesting party via Epic fax function 1 to her back from the pharmacist, and remove from pre-op pool.  Please call with questions.  Rosaria Ferries, PA-C 05/02/2018, 2:18 PM

## 2018-05-03 NOTE — Telephone Encounter (Signed)
Spoke with Estill Bamberg at dentist office and she stated patient is having 1 tooth removed.

## 2018-05-03 NOTE — Telephone Encounter (Signed)
Patient with diagnosis of atrial fibrillation on warfarin for anticoagulation.    Procedure: dental extraction - single tooth Date of procedure: pending  CHADS2-VASc score of  4 (, HTN, , DM2, stroke/tia x 2, )  CrCl 72.8 Platelet count 193 (from 2017)  We do not hold warfarin for single tooth extractions.  Patient should continue with normal dosing.

## 2018-05-08 DIAGNOSIS — F332 Major depressive disorder, recurrent severe without psychotic features: Secondary | ICD-10-CM | POA: Diagnosis not present

## 2018-05-10 ENCOUNTER — Other Ambulatory Visit: Payer: Self-pay | Admitting: Interventional Cardiology

## 2018-05-17 ENCOUNTER — Ambulatory Visit (INDEPENDENT_AMBULATORY_CARE_PROVIDER_SITE_OTHER): Payer: Medicare Other

## 2018-05-17 DIAGNOSIS — Z5181 Encounter for therapeutic drug level monitoring: Secondary | ICD-10-CM | POA: Diagnosis not present

## 2018-05-17 DIAGNOSIS — I480 Paroxysmal atrial fibrillation: Secondary | ICD-10-CM | POA: Diagnosis not present

## 2018-05-17 DIAGNOSIS — G459 Transient cerebral ischemic attack, unspecified: Secondary | ICD-10-CM

## 2018-05-17 LAB — POCT INR: INR: 3 (ref 2.0–3.0)

## 2018-05-17 NOTE — Patient Instructions (Signed)
Description   Continue on same dosage 1 tablet daily except 1.5 tablets on Mondays, Wednesdays, and Fridays.  Recheck in 4 weeks. Call clinic with any concerns [phone]

## 2018-05-22 DIAGNOSIS — N400 Enlarged prostate without lower urinary tract symptoms: Secondary | ICD-10-CM | POA: Diagnosis not present

## 2018-05-22 DIAGNOSIS — E782 Mixed hyperlipidemia: Secondary | ICD-10-CM | POA: Diagnosis not present

## 2018-05-22 DIAGNOSIS — I10 Essential (primary) hypertension: Secondary | ICD-10-CM | POA: Diagnosis not present

## 2018-05-22 DIAGNOSIS — F3341 Major depressive disorder, recurrent, in partial remission: Secondary | ICD-10-CM | POA: Diagnosis not present

## 2018-05-22 DIAGNOSIS — F411 Generalized anxiety disorder: Secondary | ICD-10-CM | POA: Diagnosis not present

## 2018-05-22 DIAGNOSIS — Z794 Long term (current) use of insulin: Secondary | ICD-10-CM | POA: Diagnosis not present

## 2018-05-22 DIAGNOSIS — E1142 Type 2 diabetes mellitus with diabetic polyneuropathy: Secondary | ICD-10-CM | POA: Diagnosis not present

## 2018-05-22 DIAGNOSIS — I4891 Unspecified atrial fibrillation: Secondary | ICD-10-CM | POA: Diagnosis not present

## 2018-05-22 DIAGNOSIS — I472 Ventricular tachycardia: Secondary | ICD-10-CM | POA: Diagnosis not present

## 2018-05-22 DIAGNOSIS — G459 Transient cerebral ischemic attack, unspecified: Secondary | ICD-10-CM | POA: Diagnosis not present

## 2018-05-22 DIAGNOSIS — Z950 Presence of cardiac pacemaker: Secondary | ICD-10-CM | POA: Diagnosis not present

## 2018-05-22 DIAGNOSIS — Z45018 Encounter for adjustment and management of other part of cardiac pacemaker: Secondary | ICD-10-CM | POA: Diagnosis not present

## 2018-06-04 DIAGNOSIS — F411 Generalized anxiety disorder: Secondary | ICD-10-CM | POA: Diagnosis not present

## 2018-06-04 DIAGNOSIS — F332 Major depressive disorder, recurrent severe without psychotic features: Secondary | ICD-10-CM | POA: Diagnosis not present

## 2018-06-14 ENCOUNTER — Ambulatory Visit (INDEPENDENT_AMBULATORY_CARE_PROVIDER_SITE_OTHER): Payer: Medicare Other | Admitting: *Deleted

## 2018-06-14 DIAGNOSIS — G459 Transient cerebral ischemic attack, unspecified: Secondary | ICD-10-CM | POA: Diagnosis not present

## 2018-06-14 DIAGNOSIS — Z5181 Encounter for therapeutic drug level monitoring: Secondary | ICD-10-CM

## 2018-06-14 DIAGNOSIS — I480 Paroxysmal atrial fibrillation: Secondary | ICD-10-CM | POA: Diagnosis not present

## 2018-06-14 LAB — POCT INR: INR: 3.5 — AB (ref 2.0–3.0)

## 2018-06-14 NOTE — Patient Instructions (Signed)
Description   Skip today's dose, then Continue on same dosage 1 tablet daily except 1.5 tablets on Mondays, Wednesdays, and Fridays.  Recheck in 3 weeks. Call clinic with any concerns [phone]

## 2018-06-22 DIAGNOSIS — E1142 Type 2 diabetes mellitus with diabetic polyneuropathy: Secondary | ICD-10-CM | POA: Diagnosis not present

## 2018-06-22 DIAGNOSIS — Z86010 Personal history of colonic polyps: Secondary | ICD-10-CM | POA: Diagnosis not present

## 2018-06-22 DIAGNOSIS — I10 Essential (primary) hypertension: Secondary | ICD-10-CM | POA: Diagnosis not present

## 2018-06-22 DIAGNOSIS — Z23 Encounter for immunization: Secondary | ICD-10-CM | POA: Diagnosis not present

## 2018-06-22 DIAGNOSIS — K219 Gastro-esophageal reflux disease without esophagitis: Secondary | ICD-10-CM | POA: Diagnosis not present

## 2018-06-22 DIAGNOSIS — F411 Generalized anxiety disorder: Secondary | ICD-10-CM | POA: Diagnosis not present

## 2018-06-22 DIAGNOSIS — E113299 Type 2 diabetes mellitus with mild nonproliferative diabetic retinopathy without macular edema, unspecified eye: Secondary | ICD-10-CM | POA: Diagnosis not present

## 2018-07-05 ENCOUNTER — Ambulatory Visit (INDEPENDENT_AMBULATORY_CARE_PROVIDER_SITE_OTHER): Payer: Medicare Other | Admitting: Pharmacist

## 2018-07-05 DIAGNOSIS — Z5181 Encounter for therapeutic drug level monitoring: Secondary | ICD-10-CM | POA: Diagnosis not present

## 2018-07-05 DIAGNOSIS — I480 Paroxysmal atrial fibrillation: Secondary | ICD-10-CM | POA: Diagnosis not present

## 2018-07-05 DIAGNOSIS — G459 Transient cerebral ischemic attack, unspecified: Secondary | ICD-10-CM | POA: Diagnosis not present

## 2018-07-05 LAB — POCT INR: INR: 3.8 — AB (ref 2.0–3.0)

## 2018-07-05 NOTE — Patient Instructions (Signed)
Description   Skip your Coumadin tomorrow, then continue on same dosage 1 tablet daily except 1.5 tablets on Mondays, Wednesdays, and Fridays. Stay consistent with your greens. Recheck in 3 weeks. Call clinic with any concerns [phone]

## 2018-07-17 DIAGNOSIS — F332 Major depressive disorder, recurrent severe without psychotic features: Secondary | ICD-10-CM | POA: Diagnosis not present

## 2018-07-17 DIAGNOSIS — F411 Generalized anxiety disorder: Secondary | ICD-10-CM | POA: Diagnosis not present

## 2018-07-27 DIAGNOSIS — Z794 Long term (current) use of insulin: Secondary | ICD-10-CM | POA: Diagnosis not present

## 2018-07-27 DIAGNOSIS — I10 Essential (primary) hypertension: Secondary | ICD-10-CM | POA: Diagnosis not present

## 2018-07-27 DIAGNOSIS — N400 Enlarged prostate without lower urinary tract symptoms: Secondary | ICD-10-CM | POA: Diagnosis not present

## 2018-07-27 DIAGNOSIS — G459 Transient cerebral ischemic attack, unspecified: Secondary | ICD-10-CM | POA: Diagnosis not present

## 2018-07-27 DIAGNOSIS — E113291 Type 2 diabetes mellitus with mild nonproliferative diabetic retinopathy without macular edema, right eye: Secondary | ICD-10-CM | POA: Diagnosis not present

## 2018-07-27 DIAGNOSIS — F331 Major depressive disorder, recurrent, moderate: Secondary | ICD-10-CM | POA: Diagnosis not present

## 2018-07-27 DIAGNOSIS — F3341 Major depressive disorder, recurrent, in partial remission: Secondary | ICD-10-CM | POA: Diagnosis not present

## 2018-07-27 DIAGNOSIS — E1142 Type 2 diabetes mellitus with diabetic polyneuropathy: Secondary | ICD-10-CM | POA: Diagnosis not present

## 2018-07-27 DIAGNOSIS — F325 Major depressive disorder, single episode, in full remission: Secondary | ICD-10-CM | POA: Diagnosis not present

## 2018-07-27 DIAGNOSIS — E782 Mixed hyperlipidemia: Secondary | ICD-10-CM | POA: Diagnosis not present

## 2018-07-27 DIAGNOSIS — I4891 Unspecified atrial fibrillation: Secondary | ICD-10-CM | POA: Diagnosis not present

## 2018-08-01 ENCOUNTER — Ambulatory Visit (INDEPENDENT_AMBULATORY_CARE_PROVIDER_SITE_OTHER): Payer: Medicare Other | Admitting: Pharmacist

## 2018-08-01 ENCOUNTER — Telehealth: Payer: Self-pay | Admitting: *Deleted

## 2018-08-01 DIAGNOSIS — Z5181 Encounter for therapeutic drug level monitoring: Secondary | ICD-10-CM

## 2018-08-01 DIAGNOSIS — I480 Paroxysmal atrial fibrillation: Secondary | ICD-10-CM

## 2018-08-01 DIAGNOSIS — G459 Transient cerebral ischemic attack, unspecified: Secondary | ICD-10-CM

## 2018-08-01 LAB — POCT INR: INR: 2.9 (ref 2.0–3.0)

## 2018-08-01 NOTE — Telephone Encounter (Signed)
   [facility] Pre-operative Risk Assessment    Request for surgical clearance:  1. What type of surgery is being performed? COLONOSCOPY / ENDOSCOPY   2. When is this surgery scheduled? 08/16/18   3. What type of clearance is required (medical clearance vs. Pharmacy clearance to hold med vs. Both)? PHARMACY  4. Are there any medications that need to be held prior to surgery and how long?COUMADIN   5. Practice name and name of physician performing surgery?  [facility] / DR. Alessandra Bevels   6. What is your office phone number [phone]    7.   What is your office fax number [phone]  8.   Anesthesia type (None, local, MAC, general) ?  PROPROFOL   Lawrence [name] 08/01/2018, 1:00 PM  _________________________________________________________________   (provider comments below)

## 2018-08-01 NOTE — Telephone Encounter (Signed)
Pt takes warfarin for afib with CHADS2VASc score of 5 (age, HTN, DM, TIA). Pt had INR checked today and states he does not recall having a TIA (listed in Epic as occurring in 2003). He also had an ablation in the fall of 2017 at [facility] and has not had recurrent afib since then. I have sent a staff message to Dr Tamala Julian to see if he would like [place] to bridge pt with Lovenox. Pt is ok with coming in next week for bridging if needed (he was seen in Coumadin clinic this AM and all of this was discussed). He has not used Lovenox before since he previously took Xarelto and did not require bridging.

## 2018-08-01 NOTE — Patient Instructions (Signed)
Description   Continue on same dosage 1 tablet daily except 1.5 tablets on Mondays, Wednesdays, and Fridays. Stay consistent with your greens. Have the GI office send a clearance request to fax [phone]. Recheck in 4 weeks. Call clinic with any concerns [phone]

## 2018-08-06 NOTE — Telephone Encounter (Addendum)
Discussed with Dr Tamala Julian. Ok to hold warfarin 4 days prior to procedure without Lovenox bridge. No afib detected on device since Tikosyn discontinued after afib ablation. Will have pt boost with extra 1/2 tablet of warfarin x2 days when he resumes warfarin on 12/12 in PM after procedure. Will recheck INR 1 week after procedure. Pt is aware of anticoagulation plan.

## 2018-08-07 NOTE — Telephone Encounter (Signed)
   Primary Cardiologist: [name], MD  Chart reviewed as part of pre-operative protocol coverage. Patient was contacted 08/07/2018 in reference to pre-operative risk assessment for pending surgery as outlined below.  Lawrence [name]. was last seen on 03/2018 by Dr. Tamala Julian. H/o AF, HTN, ?TIA, HLD, pacer, DM. EF normal 2015. Revised cardiac risk index 6.6% but procedure is generally low risk. Called patient to ensure he has been doing well but got voicemail. LMOM to call us back ASAP to review for any new sx. As long as feeling well, should be cleared to proceed - anticoag plan as outlined below.  [name], PA-C 08/07/2018, 3:59 PM

## 2018-08-08 ENCOUNTER — Other Ambulatory Visit: Payer: Self-pay | Admitting: Interventional Cardiology

## 2018-08-09 NOTE — Telephone Encounter (Signed)
Attempted to reach out to patient again but got VM. LMOM again to call us back. Dayna Dunn PA-C

## 2018-08-09 NOTE — Telephone Encounter (Signed)
   Primary Cardiologist: [name], MD  Chart revisited as part of pre-operative protocol coverage. Patient called back and affirms he's doing great. No new anginal sx. No CP, SOB, palpitations, syncope. Given past medical history and time since last visit, based on ACC/AHA guidelines, Vergil Burby. would be at acceptable risk for the planned procedure without further cardiovascular testing.   Pharmacist reviewed chart and stated, "Discussed with Dr Tamala Julian. Ok to hold warfarin 4 days prior to procedure without Lovenox bridge. No afib detected on device since Tikosyn discontinued after afib ablation. Will have pt boost with extra 1/2 tablet of warfarin x2 days when he resumes warfarin on 12/12 in PM after procedure. Will recheck INR 1 week after procedure. Pt is aware of anticoagulation plan."  Patient affirms he understands anticoag plan. I will route this recommendation to the requesting party via Epic fax function and remove from pre-op pool.  Please call with questions.  [name], PA-C 08/09/2018, 3:38 PM

## 2018-08-10 DIAGNOSIS — J209 Acute bronchitis, unspecified: Secondary | ICD-10-CM | POA: Diagnosis not present

## 2018-08-16 DIAGNOSIS — D122 Benign neoplasm of ascending colon: Secondary | ICD-10-CM | POA: Diagnosis not present

## 2018-08-16 DIAGNOSIS — D120 Benign neoplasm of cecum: Secondary | ICD-10-CM | POA: Diagnosis not present

## 2018-08-16 DIAGNOSIS — Z86010 Personal history of colonic polyps: Secondary | ICD-10-CM | POA: Diagnosis not present

## 2018-08-16 DIAGNOSIS — Z8501 Personal history of malignant neoplasm of esophagus: Secondary | ICD-10-CM | POA: Diagnosis not present

## 2018-08-16 DIAGNOSIS — K2950 Unspecified chronic gastritis without bleeding: Secondary | ICD-10-CM | POA: Diagnosis not present

## 2018-08-16 DIAGNOSIS — Z9889 Other specified postprocedural states: Secondary | ICD-10-CM | POA: Diagnosis not present

## 2018-08-16 DIAGNOSIS — K319 Disease of stomach and duodenum, unspecified: Secondary | ICD-10-CM | POA: Diagnosis not present

## 2018-08-16 DIAGNOSIS — K621 Rectal polyp: Secondary | ICD-10-CM | POA: Diagnosis not present

## 2018-08-16 DIAGNOSIS — D125 Benign neoplasm of sigmoid colon: Secondary | ICD-10-CM | POA: Diagnosis not present

## 2018-08-16 DIAGNOSIS — K219 Gastro-esophageal reflux disease without esophagitis: Secondary | ICD-10-CM | POA: Diagnosis not present

## 2018-08-16 DIAGNOSIS — D123 Benign neoplasm of transverse colon: Secondary | ICD-10-CM | POA: Diagnosis not present

## 2018-08-16 DIAGNOSIS — K635 Polyp of colon: Secondary | ICD-10-CM | POA: Diagnosis not present

## 2018-08-16 DIAGNOSIS — K3189 Other diseases of stomach and duodenum: Secondary | ICD-10-CM | POA: Diagnosis not present

## 2018-08-20 DIAGNOSIS — R911 Solitary pulmonary nodule: Secondary | ICD-10-CM | POA: Diagnosis not present

## 2018-08-20 DIAGNOSIS — R062 Wheezing: Secondary | ICD-10-CM | POA: Diagnosis not present

## 2018-08-20 DIAGNOSIS — Z7901 Long term (current) use of anticoagulants: Secondary | ICD-10-CM | POA: Diagnosis not present

## 2018-08-20 DIAGNOSIS — I4819 Other persistent atrial fibrillation: Secondary | ICD-10-CM | POA: Diagnosis not present

## 2018-08-20 DIAGNOSIS — I472 Ventricular tachycardia: Secondary | ICD-10-CM | POA: Diagnosis not present

## 2018-08-20 DIAGNOSIS — Z79899 Other long term (current) drug therapy: Secondary | ICD-10-CM | POA: Diagnosis not present

## 2018-08-20 DIAGNOSIS — R001 Bradycardia, unspecified: Secondary | ICD-10-CM | POA: Diagnosis not present

## 2018-08-20 DIAGNOSIS — Z8679 Personal history of other diseases of the circulatory system: Secondary | ICD-10-CM | POA: Diagnosis not present

## 2018-08-20 DIAGNOSIS — E875 Hyperkalemia: Secondary | ICD-10-CM | POA: Diagnosis not present

## 2018-08-20 DIAGNOSIS — Z45018 Encounter for adjustment and management of other part of cardiac pacemaker: Secondary | ICD-10-CM | POA: Diagnosis not present

## 2018-08-20 DIAGNOSIS — R05 Cough: Secondary | ICD-10-CM | POA: Diagnosis not present

## 2018-08-20 DIAGNOSIS — Z8501 Personal history of malignant neoplasm of esophagus: Secondary | ICD-10-CM | POA: Diagnosis not present

## 2018-08-20 DIAGNOSIS — Z950 Presence of cardiac pacemaker: Secondary | ICD-10-CM | POA: Diagnosis not present

## 2018-08-23 ENCOUNTER — Ambulatory Visit (INDEPENDENT_AMBULATORY_CARE_PROVIDER_SITE_OTHER): Payer: Medicare Other | Admitting: Pharmacist

## 2018-08-23 DIAGNOSIS — K621 Rectal polyp: Secondary | ICD-10-CM | POA: Diagnosis not present

## 2018-08-23 DIAGNOSIS — D125 Benign neoplasm of sigmoid colon: Secondary | ICD-10-CM | POA: Diagnosis not present

## 2018-08-23 DIAGNOSIS — G459 Transient cerebral ischemic attack, unspecified: Secondary | ICD-10-CM

## 2018-08-23 DIAGNOSIS — D120 Benign neoplasm of cecum: Secondary | ICD-10-CM | POA: Diagnosis not present

## 2018-08-23 DIAGNOSIS — K319 Disease of stomach and duodenum, unspecified: Secondary | ICD-10-CM | POA: Diagnosis not present

## 2018-08-23 DIAGNOSIS — D122 Benign neoplasm of ascending colon: Secondary | ICD-10-CM | POA: Diagnosis not present

## 2018-08-23 DIAGNOSIS — K635 Polyp of colon: Secondary | ICD-10-CM | POA: Diagnosis not present

## 2018-08-23 DIAGNOSIS — I480 Paroxysmal atrial fibrillation: Secondary | ICD-10-CM | POA: Diagnosis not present

## 2018-08-23 DIAGNOSIS — Z5181 Encounter for therapeutic drug level monitoring: Secondary | ICD-10-CM | POA: Diagnosis not present

## 2018-08-23 DIAGNOSIS — D123 Benign neoplasm of transverse colon: Secondary | ICD-10-CM | POA: Diagnosis not present

## 2018-08-23 LAB — POCT INR: INR: 2.2 (ref 2.0–3.0)

## 2018-08-23 NOTE — Patient Instructions (Signed)
Continue on same dosage 1 tablet daily except 1.5 tablets on Mondays, Wednesdays, and Fridays. Stay consistent with your greens. Recheck in 3 weeks. Call clinic with any concerns [phone]

## 2018-09-04 DIAGNOSIS — N400 Enlarged prostate without lower urinary tract symptoms: Secondary | ICD-10-CM | POA: Diagnosis not present

## 2018-09-04 DIAGNOSIS — I10 Essential (primary) hypertension: Secondary | ICD-10-CM | POA: Diagnosis not present

## 2018-09-04 DIAGNOSIS — E113299 Type 2 diabetes mellitus with mild nonproliferative diabetic retinopathy without macular edema, unspecified eye: Secondary | ICD-10-CM | POA: Diagnosis not present

## 2018-09-04 DIAGNOSIS — E1142 Type 2 diabetes mellitus with diabetic polyneuropathy: Secondary | ICD-10-CM | POA: Diagnosis not present

## 2018-09-04 DIAGNOSIS — G459 Transient cerebral ischemic attack, unspecified: Secondary | ICD-10-CM | POA: Diagnosis not present

## 2018-09-04 DIAGNOSIS — F331 Major depressive disorder, recurrent, moderate: Secondary | ICD-10-CM | POA: Diagnosis not present

## 2018-09-04 DIAGNOSIS — E782 Mixed hyperlipidemia: Secondary | ICD-10-CM | POA: Diagnosis not present

## 2018-09-04 DIAGNOSIS — F3341 Major depressive disorder, recurrent, in partial remission: Secondary | ICD-10-CM | POA: Diagnosis not present

## 2018-09-04 DIAGNOSIS — F325 Major depressive disorder, single episode, in full remission: Secondary | ICD-10-CM | POA: Diagnosis not present

## 2018-09-04 DIAGNOSIS — I4891 Unspecified atrial fibrillation: Secondary | ICD-10-CM | POA: Diagnosis not present

## 2018-09-12 DIAGNOSIS — R05 Cough: Secondary | ICD-10-CM | POA: Diagnosis not present

## 2018-09-13 ENCOUNTER — Ambulatory Visit (INDEPENDENT_AMBULATORY_CARE_PROVIDER_SITE_OTHER): Payer: Medicare Other

## 2018-09-13 DIAGNOSIS — I480 Paroxysmal atrial fibrillation: Secondary | ICD-10-CM | POA: Diagnosis not present

## 2018-09-13 DIAGNOSIS — G459 Transient cerebral ischemic attack, unspecified: Secondary | ICD-10-CM | POA: Diagnosis not present

## 2018-09-13 DIAGNOSIS — Z5181 Encounter for therapeutic drug level monitoring: Secondary | ICD-10-CM

## 2018-09-13 LAB — POCT INR: INR: 5 — AB (ref 2.0–3.0)

## 2018-09-13 NOTE — Patient Instructions (Signed)
Description   Skip 2 dosages of Coumadin, then start taking 1 tablet daily except 1.5 tablets on Mondays and Fridays. Stay consistent with your greens. Recheck in 10 days.  Call clinic with any concerns [phone]

## 2018-09-19 DIAGNOSIS — F332 Major depressive disorder, recurrent severe without psychotic features: Secondary | ICD-10-CM | POA: Diagnosis not present

## 2018-09-19 DIAGNOSIS — F411 Generalized anxiety disorder: Secondary | ICD-10-CM | POA: Diagnosis not present

## 2018-09-24 ENCOUNTER — Ambulatory Visit (INDEPENDENT_AMBULATORY_CARE_PROVIDER_SITE_OTHER): Payer: Medicare Other | Admitting: Pharmacist

## 2018-09-24 DIAGNOSIS — Z5181 Encounter for therapeutic drug level monitoring: Secondary | ICD-10-CM | POA: Diagnosis not present

## 2018-09-24 DIAGNOSIS — I10 Essential (primary) hypertension: Secondary | ICD-10-CM | POA: Diagnosis not present

## 2018-09-24 DIAGNOSIS — E1142 Type 2 diabetes mellitus with diabetic polyneuropathy: Secondary | ICD-10-CM | POA: Diagnosis not present

## 2018-09-24 DIAGNOSIS — I480 Paroxysmal atrial fibrillation: Secondary | ICD-10-CM

## 2018-09-24 DIAGNOSIS — E782 Mixed hyperlipidemia: Secondary | ICD-10-CM | POA: Diagnosis not present

## 2018-09-24 DIAGNOSIS — G459 Transient cerebral ischemic attack, unspecified: Secondary | ICD-10-CM | POA: Diagnosis not present

## 2018-09-24 DIAGNOSIS — F3341 Major depressive disorder, recurrent, in partial remission: Secondary | ICD-10-CM | POA: Diagnosis not present

## 2018-09-24 DIAGNOSIS — Z794 Long term (current) use of insulin: Secondary | ICD-10-CM | POA: Diagnosis not present

## 2018-09-24 DIAGNOSIS — N400 Enlarged prostate without lower urinary tract symptoms: Secondary | ICD-10-CM | POA: Diagnosis not present

## 2018-09-24 DIAGNOSIS — F331 Major depressive disorder, recurrent, moderate: Secondary | ICD-10-CM | POA: Diagnosis not present

## 2018-09-24 DIAGNOSIS — F325 Major depressive disorder, single episode, in full remission: Secondary | ICD-10-CM | POA: Diagnosis not present

## 2018-09-24 DIAGNOSIS — I4891 Unspecified atrial fibrillation: Secondary | ICD-10-CM | POA: Diagnosis not present

## 2018-09-24 DIAGNOSIS — E113293 Type 2 diabetes mellitus with mild nonproliferative diabetic retinopathy without macular edema, bilateral: Secondary | ICD-10-CM | POA: Diagnosis not present

## 2018-09-24 LAB — POCT INR: INR: 1.6 — AB (ref 2.0–3.0)

## 2018-09-24 NOTE — Patient Instructions (Signed)
Description   Take 2 tablets today and 1.5 tablets tomorrow, then continue taking 1 tablet daily except 1.5 tablets on Mondays and Fridays. Stay consistent with your greens. Recheck in 2 weeks. Call clinic with any concerns [phone]

## 2018-10-03 DIAGNOSIS — H52222 Regular astigmatism, left eye: Secondary | ICD-10-CM | POA: Diagnosis not present

## 2018-10-03 DIAGNOSIS — E113293 Type 2 diabetes mellitus with mild nonproliferative diabetic retinopathy without macular edema, bilateral: Secondary | ICD-10-CM | POA: Diagnosis not present

## 2018-10-03 DIAGNOSIS — H40013 Open angle with borderline findings, low risk, bilateral: Secondary | ICD-10-CM | POA: Diagnosis not present

## 2018-10-03 DIAGNOSIS — H2513 Age-related nuclear cataract, bilateral: Secondary | ICD-10-CM | POA: Diagnosis not present

## 2018-10-03 DIAGNOSIS — H353132 Nonexudative age-related macular degeneration, bilateral, intermediate dry stage: Secondary | ICD-10-CM | POA: Diagnosis not present

## 2018-10-03 DIAGNOSIS — H5201 Hypermetropia, right eye: Secondary | ICD-10-CM | POA: Diagnosis not present

## 2018-10-03 DIAGNOSIS — H524 Presbyopia: Secondary | ICD-10-CM | POA: Diagnosis not present

## 2018-10-03 DIAGNOSIS — H40053 Ocular hypertension, bilateral: Secondary | ICD-10-CM | POA: Diagnosis not present

## 2018-10-08 ENCOUNTER — Ambulatory Visit (INDEPENDENT_AMBULATORY_CARE_PROVIDER_SITE_OTHER): Payer: Medicare Other

## 2018-10-08 DIAGNOSIS — I480 Paroxysmal atrial fibrillation: Secondary | ICD-10-CM

## 2018-10-08 DIAGNOSIS — Z5181 Encounter for therapeutic drug level monitoring: Secondary | ICD-10-CM | POA: Diagnosis not present

## 2018-10-08 DIAGNOSIS — G459 Transient cerebral ischemic attack, unspecified: Secondary | ICD-10-CM

## 2018-10-08 LAB — POCT INR: INR: 1.9 — AB (ref 2.0–3.0)

## 2018-10-08 NOTE — Patient Instructions (Signed)
Description   Start taking 1 tablet daily except 1.5 tablets on Mondays, Wednesdays and Fridays. Recheck in 3 weeks. Call clinic with any concerns [phone]

## 2018-10-25 DIAGNOSIS — E113293 Type 2 diabetes mellitus with mild nonproliferative diabetic retinopathy without macular edema, bilateral: Secondary | ICD-10-CM | POA: Diagnosis not present

## 2018-10-25 DIAGNOSIS — K219 Gastro-esophageal reflux disease without esophagitis: Secondary | ICD-10-CM | POA: Diagnosis not present

## 2018-10-25 DIAGNOSIS — I10 Essential (primary) hypertension: Secondary | ICD-10-CM | POA: Diagnosis not present

## 2018-10-25 DIAGNOSIS — E1142 Type 2 diabetes mellitus with diabetic polyneuropathy: Secondary | ICD-10-CM | POA: Diagnosis not present

## 2018-10-29 ENCOUNTER — Ambulatory Visit (INDEPENDENT_AMBULATORY_CARE_PROVIDER_SITE_OTHER): Payer: Medicare Other | Admitting: *Deleted

## 2018-10-29 DIAGNOSIS — Z5181 Encounter for therapeutic drug level monitoring: Secondary | ICD-10-CM

## 2018-10-29 DIAGNOSIS — G459 Transient cerebral ischemic attack, unspecified: Secondary | ICD-10-CM

## 2018-10-29 DIAGNOSIS — I480 Paroxysmal atrial fibrillation: Secondary | ICD-10-CM | POA: Diagnosis not present

## 2018-10-29 LAB — POCT INR: INR: 2.5 (ref 2.0–3.0)

## 2018-10-29 NOTE — Patient Instructions (Signed)
Description   Continue taking 1 tablet daily except 1.5 tablets on Mondays, Wednesdays and Fridays. Recheck in 4 weeks. Call clinic with any concerns [phone]

## 2018-11-14 DIAGNOSIS — F332 Major depressive disorder, recurrent severe without psychotic features: Secondary | ICD-10-CM | POA: Diagnosis not present

## 2018-11-14 DIAGNOSIS — F411 Generalized anxiety disorder: Secondary | ICD-10-CM | POA: Diagnosis not present

## 2018-11-26 ENCOUNTER — Telehealth: Payer: Self-pay

## 2018-11-26 NOTE — Telephone Encounter (Signed)
LMOM FOR PRESCREEN AND DRIVE THRU 

## 2018-11-27 ENCOUNTER — Telehealth: Payer: Self-pay | Admitting: Pharmacist

## 2018-11-27 MED ORDER — RIVAROXABAN 20 MG PO TABS: 20.0000 mg | ORAL_TABLET | Freq: Every day | ORAL | 5 refills | Status: DC

## 2018-11-27 NOTE — Telephone Encounter (Signed)

## 2018-11-27 NOTE — Telephone Encounter (Signed)
Copay for Lawrence [name] is $461-patient stated that even if this is his deducible it is still cost prohibitive.

## 2018-11-27 NOTE — Telephone Encounter (Signed)
Patient interested in switching to Xarelto. I have sent in Rx for Xarelto to check on the price. Patient thinks he paid his deducible already. Will

## 2018-11-27 NOTE — Addendum Note (Signed)
Addended by: Marcelle Overlie D on: 11/27/2018 11:34 AM   Modules accepted: Orders

## 2018-11-28 ENCOUNTER — Other Ambulatory Visit: Payer: Self-pay

## 2018-11-28 ENCOUNTER — Ambulatory Visit (INDEPENDENT_AMBULATORY_CARE_PROVIDER_SITE_OTHER): Payer: Medicare Other | Admitting: Pharmacist

## 2018-11-28 DIAGNOSIS — I480 Paroxysmal atrial fibrillation: Secondary | ICD-10-CM

## 2018-11-28 DIAGNOSIS — G459 Transient cerebral ischemic attack, unspecified: Secondary | ICD-10-CM

## 2018-11-28 DIAGNOSIS — Z5181 Encounter for therapeutic drug level monitoring: Secondary | ICD-10-CM

## 2018-11-28 LAB — POCT INR: INR: 2.6 (ref 2.0–3.0)

## 2018-12-17 DIAGNOSIS — Z950 Presence of cardiac pacemaker: Secondary | ICD-10-CM | POA: Diagnosis not present

## 2018-12-17 DIAGNOSIS — Z45018 Encounter for adjustment and management of other part of cardiac pacemaker: Secondary | ICD-10-CM | POA: Diagnosis not present

## 2018-12-20 DIAGNOSIS — Z794 Long term (current) use of insulin: Secondary | ICD-10-CM | POA: Diagnosis not present

## 2018-12-20 DIAGNOSIS — E1142 Type 2 diabetes mellitus with diabetic polyneuropathy: Secondary | ICD-10-CM | POA: Diagnosis not present

## 2018-12-20 DIAGNOSIS — I10 Essential (primary) hypertension: Secondary | ICD-10-CM | POA: Diagnosis not present

## 2018-12-20 DIAGNOSIS — N400 Enlarged prostate without lower urinary tract symptoms: Secondary | ICD-10-CM | POA: Diagnosis not present

## 2018-12-20 DIAGNOSIS — G459 Transient cerebral ischemic attack, unspecified: Secondary | ICD-10-CM | POA: Diagnosis not present

## 2018-12-20 DIAGNOSIS — E782 Mixed hyperlipidemia: Secondary | ICD-10-CM | POA: Diagnosis not present

## 2018-12-20 DIAGNOSIS — E113299 Type 2 diabetes mellitus with mild nonproliferative diabetic retinopathy without macular edema, unspecified eye: Secondary | ICD-10-CM | POA: Diagnosis not present

## 2018-12-20 DIAGNOSIS — F331 Major depressive disorder, recurrent, moderate: Secondary | ICD-10-CM | POA: Diagnosis not present

## 2018-12-20 DIAGNOSIS — F325 Major depressive disorder, single episode, in full remission: Secondary | ICD-10-CM | POA: Diagnosis not present

## 2018-12-20 DIAGNOSIS — F3341 Major depressive disorder, recurrent, in partial remission: Secondary | ICD-10-CM | POA: Diagnosis not present

## 2018-12-20 DIAGNOSIS — I4891 Unspecified atrial fibrillation: Secondary | ICD-10-CM | POA: Diagnosis not present

## 2019-01-07 ENCOUNTER — Telehealth: Payer: Self-pay

## 2019-01-07 DIAGNOSIS — F411 Generalized anxiety disorder: Secondary | ICD-10-CM | POA: Diagnosis not present

## 2019-01-07 DIAGNOSIS — F332 Major depressive disorder, recurrent severe without psychotic features: Secondary | ICD-10-CM | POA: Diagnosis not present

## 2019-01-07 NOTE — Telephone Encounter (Signed)

## 2019-01-07 NOTE — Telephone Encounter (Signed)
lmom for prescreen  

## 2019-01-09 ENCOUNTER — Telehealth: Payer: Self-pay | Admitting: Interventional Cardiology

## 2019-01-09 ENCOUNTER — Ambulatory Visit (INDEPENDENT_AMBULATORY_CARE_PROVIDER_SITE_OTHER): Payer: Medicare Other

## 2019-01-09 ENCOUNTER — Other Ambulatory Visit: Payer: Self-pay

## 2019-01-09 DIAGNOSIS — I480 Paroxysmal atrial fibrillation: Secondary | ICD-10-CM

## 2019-01-09 DIAGNOSIS — G459 Transient cerebral ischemic attack, unspecified: Secondary | ICD-10-CM

## 2019-01-09 DIAGNOSIS — Z5181 Encounter for therapeutic drug level monitoring: Secondary | ICD-10-CM

## 2019-01-09 LAB — POCT INR: INR: 2.5 (ref 2.0–3.0)

## 2019-01-09 NOTE — Telephone Encounter (Signed)
Attempted to contact pt back, LMOM TCB for INR results see anticoagulation note in Epic.

## 2019-01-09 NOTE — Telephone Encounter (Signed)
Follow up: ° ° ° °Patient returning your call back. please call patient. °

## 2019-01-10 NOTE — Patient Instructions (Signed)
Description   Called spoke with pt, advised to continue on same dosage 1 tablet daily except 1.5 tablets on Mondays, Wednesdays and Fridays. Recheck in 6 weeks. Call clinic with any concerns [phone]

## 2019-02-10 ENCOUNTER — Other Ambulatory Visit: Payer: Self-pay | Admitting: Interventional Cardiology

## 2019-02-13 ENCOUNTER — Telehealth: Payer: Self-pay

## 2019-02-13 NOTE — Telephone Encounter (Signed)
lmom for prescreen  

## 2019-03-01 DIAGNOSIS — Z0000 Encounter for general adult medical examination without abnormal findings: Secondary | ICD-10-CM | POA: Diagnosis not present

## 2019-03-01 DIAGNOSIS — Z1389 Encounter for screening for other disorder: Secondary | ICD-10-CM | POA: Diagnosis not present

## 2019-03-04 DIAGNOSIS — F332 Major depressive disorder, recurrent severe without psychotic features: Secondary | ICD-10-CM | POA: Diagnosis not present

## 2019-03-06 ENCOUNTER — Telehealth: Payer: Self-pay

## 2019-03-06 NOTE — Telephone Encounter (Signed)
lmom for prescreen  

## 2019-03-07 ENCOUNTER — Other Ambulatory Visit: Payer: Self-pay | Admitting: Interventional Cardiology

## 2019-03-07 DIAGNOSIS — I472 Ventricular tachycardia: Secondary | ICD-10-CM | POA: Diagnosis not present

## 2019-03-07 DIAGNOSIS — I4819 Other persistent atrial fibrillation: Secondary | ICD-10-CM | POA: Diagnosis not present

## 2019-03-07 DIAGNOSIS — R001 Bradycardia, unspecified: Secondary | ICD-10-CM | POA: Diagnosis not present

## 2019-03-07 DIAGNOSIS — Z950 Presence of cardiac pacemaker: Secondary | ICD-10-CM | POA: Diagnosis not present

## 2019-03-07 DIAGNOSIS — Z8679 Personal history of other diseases of the circulatory system: Secondary | ICD-10-CM | POA: Diagnosis not present

## 2019-03-13 ENCOUNTER — Other Ambulatory Visit: Payer: Self-pay

## 2019-03-13 ENCOUNTER — Ambulatory Visit (INDEPENDENT_AMBULATORY_CARE_PROVIDER_SITE_OTHER): Payer: Medicare Other | Admitting: Pharmacist

## 2019-03-13 DIAGNOSIS — G459 Transient cerebral ischemic attack, unspecified: Secondary | ICD-10-CM | POA: Diagnosis not present

## 2019-03-13 DIAGNOSIS — I480 Paroxysmal atrial fibrillation: Secondary | ICD-10-CM

## 2019-03-13 DIAGNOSIS — Z5181 Encounter for therapeutic drug level monitoring: Secondary | ICD-10-CM

## 2019-03-13 LAB — POCT INR: INR: 3.7 — AB (ref 2.0–3.0)

## 2019-03-13 NOTE — Patient Instructions (Addendum)
Description   Skip Coumadin tomorrow, then continue on same dosage 1 tablet daily except 1.5 tablets on Mondays, Wednesdays and Fridays. Recheck in 3 weeks. Call clinic with any concerns [phone]

## 2019-03-14 DIAGNOSIS — R1111 Vomiting without nausea: Secondary | ICD-10-CM | POA: Diagnosis not present

## 2019-03-18 DIAGNOSIS — Z45018 Encounter for adjustment and management of other part of cardiac pacemaker: Secondary | ICD-10-CM | POA: Diagnosis not present

## 2019-03-18 DIAGNOSIS — Z950 Presence of cardiac pacemaker: Secondary | ICD-10-CM | POA: Diagnosis not present

## 2019-03-19 ENCOUNTER — Ambulatory Visit
Admission: RE | Admit: 2019-03-19 | Discharge: 2019-03-19 | Disposition: A | Discharge status: Home / Self Care | Payer: Medicare Other | Source: Ambulatory Visit | Attending: Internal Medicine | Admitting: Internal Medicine

## 2019-03-19 ENCOUNTER — Other Ambulatory Visit: Payer: Self-pay | Admitting: Internal Medicine

## 2019-03-19 DIAGNOSIS — Z125 Encounter for screening for malignant neoplasm of prostate: Secondary | ICD-10-CM | POA: Diagnosis not present

## 2019-03-19 DIAGNOSIS — K219 Gastro-esophageal reflux disease without esophagitis: Secondary | ICD-10-CM | POA: Diagnosis not present

## 2019-03-19 DIAGNOSIS — E782 Mixed hyperlipidemia: Secondary | ICD-10-CM | POA: Diagnosis not present

## 2019-03-19 DIAGNOSIS — R05 Cough: Secondary | ICD-10-CM

## 2019-03-19 DIAGNOSIS — I10 Essential (primary) hypertension: Secondary | ICD-10-CM | POA: Diagnosis not present

## 2019-03-19 DIAGNOSIS — F411 Generalized anxiety disorder: Secondary | ICD-10-CM | POA: Diagnosis not present

## 2019-03-19 DIAGNOSIS — E113293 Type 2 diabetes mellitus with mild nonproliferative diabetic retinopathy without macular edema, bilateral: Secondary | ICD-10-CM | POA: Diagnosis not present

## 2019-03-19 DIAGNOSIS — R053 Chronic cough: Secondary | ICD-10-CM

## 2019-03-19 DIAGNOSIS — E1142 Type 2 diabetes mellitus with diabetic polyneuropathy: Secondary | ICD-10-CM | POA: Diagnosis not present

## 2019-03-19 DIAGNOSIS — F325 Major depressive disorder, single episode, in full remission: Secondary | ICD-10-CM | POA: Diagnosis not present

## 2019-03-22 DIAGNOSIS — Z950 Presence of cardiac pacemaker: Secondary | ICD-10-CM | POA: Diagnosis not present

## 2019-04-02 ENCOUNTER — Telehealth: Payer: Self-pay | Admitting: Pharmacist

## 2019-04-02 NOTE — Telephone Encounter (Signed)

## 2019-04-03 ENCOUNTER — Other Ambulatory Visit: Payer: Self-pay

## 2019-04-03 ENCOUNTER — Ambulatory Visit (INDEPENDENT_AMBULATORY_CARE_PROVIDER_SITE_OTHER): Payer: Medicare Other | Admitting: *Deleted

## 2019-04-03 DIAGNOSIS — Z5181 Encounter for therapeutic drug level monitoring: Secondary | ICD-10-CM | POA: Diagnosis not present

## 2019-04-03 DIAGNOSIS — G459 Transient cerebral ischemic attack, unspecified: Secondary | ICD-10-CM

## 2019-04-03 DIAGNOSIS — I480 Paroxysmal atrial fibrillation: Secondary | ICD-10-CM | POA: Diagnosis not present

## 2019-04-03 LAB — POCT INR: INR: 3.4 — AB (ref 2.0–3.0)

## 2019-04-03 NOTE — Patient Instructions (Signed)
Description   Since you missed yesterdays's dose take 1/2 tablet tomorrow then change dose 1 tablet daily except 1.5 tablets on Mondays and Fridays. Recheck in 3 weeks. Call clinic with any concerns [phone]

## 2019-04-08 DIAGNOSIS — I10 Essential (primary) hypertension: Secondary | ICD-10-CM | POA: Diagnosis not present

## 2019-04-08 DIAGNOSIS — R9389 Abnormal findings on diagnostic imaging of other specified body structures: Secondary | ICD-10-CM | POA: Diagnosis not present

## 2019-04-08 DIAGNOSIS — R05 Cough: Secondary | ICD-10-CM | POA: Diagnosis not present

## 2019-04-08 DIAGNOSIS — E113291 Type 2 diabetes mellitus with mild nonproliferative diabetic retinopathy without macular edema, right eye: Secondary | ICD-10-CM | POA: Diagnosis not present

## 2019-04-18 NOTE — Progress Notes (Signed)
Cardiology Office Note:    Date:  04/19/2019   ID:  Lawrence [name]., DOB [birth year], MRN [id number]  PCP:  [name], MD  Cardiologist:  [name], MD   Referring MD: [name], MD   Chief Complaint  Patient presents with  . Atrial Fibrillation    History of Present Illness:    Lawrence [name]. is a 68 y.o. male with a hx of atrial fibrillation (Dr.Daubert at [facility]) , dofetilide therapy, chronic anticoagulation, hypertension, TIA, and hyperlipidemia.. Prior history of esophagectomy for esophageal cancer 2013.  He had atrial fibrillation at [facility] in 2018 and has had no subsequent atrial fib.  Dofetilide has been discontinued since November 2018.  He is having cough that is worse when he lays down.  ACE inhibitor therapy was stopped and the cough is improved.  A chest x-ray done because of the cough suggested the presence of congestive heart failure with cardiomegaly and parenchymal changes consistent with failure.  The patient complains of exertional dyspnea and fatigue.  Exertional tolerance is significantly worse than a year ago.  He has gained 20 pounds and has been sedentary doing cold at 19 pandemic.  There is mild tightness in the chest when he is short of breath.  Rest relieves the discomfort.  Past Medical History:  Diagnosis Date  . Bradycardia   . GERD (gastroesophageal reflux disease)   . H/O hiatal hernia    s/p  repair 06-26-2012  . History of esophagectomy 08-21-2012  AT [facility]  . History of kidney stones 2006  . History of malignant neoplasm of esophagus ONCOLOGIST-  DR D'AMICO AT [facility]-- PER LAST NOTE 01/ 2018  NO RECURRENCE   dx 10/ 2013  Stage 2A (T2 N0)  08-21-2012 s/p  esophagectomy w/ gastric pull-through [facility]) post residual recurrent anastomotic stricture (multiple EGD w/ dilations)  . History of transient ischemic attack (TIA) 2003   no residual  . Hypertension   . Long term (current)  use of anticoagulants    xarelto  . On dofetilide therapy   . PAF (paroxysmal atrial fibrillation) [facility]) first dx 2009   primary cardiologist-  dr Daneen Schick /  EP cardiologist -- dr Lurene Shadow ([facility])  s/p  afib ablation 08-02-2016  . Pancreatic cyst    x2   . Peyronie's disease   . Presence of permanent cardiac pacemaker implanted 05-01-2014  at [facility] follows  . Pulmonary nodule, right    upper and middle lobes --  per last CT stable  . Type 2 diabetes mellitus treated with insulin ([place])   . Wears glasses     Past Surgical History:  Procedure Laterality Date  . ANTERIOR CERVICAL DECOMP/DISCECTOMY FUSION N/A 06/02/2016   Procedure: ANTERIOR CERVICAL DECOMPRESSION FUSION CERVICAL 5-6, CERVICAL 6-7 WITH INSTRUMENTATION AND ALLOGRAFT;  Surgeon: Phylliss Bob, MD;  Location: [facility];  Service: Orthopedics;  Laterality: N/A;  ANTERIOR CERVICAL DECOMPRESSION FUSION CERVICAL 5-6, CERVICAL 6-7 WITH INSTRUMENTATION AND ALLOGRAFT  . BIOPSY  06/26/2012   Procedure: BIOPSY;  Surgeon: Madilyn Hook, DO;  Location: [facility];  Service: General;;  . CARDIAC ELECTROPHYSIOLOGY STUDY AND ABLATION  08-02-2016   [facility]   (dr Lurene Shadow)   pulmonary veins isolation and ablation afib  . CARDIAC PACEMAKER PLACEMENT  05-01-2014   [facility]  . CHOLECYSTECTOMY  1988  . COLONOSCOPY WITH PROPOFOL N/A 07/13/2015   Procedure: COLONOSCOPY WITH PROPOFOL;  Surgeon: Garlan Fair, MD;  Location: [facility];  Service: Endoscopy;  Laterality: N/A;  . ESOPHAGECTOMY  08-21-2012   [facility]   w/ gastric pull-through  . ESOPHAGOGASTRODUODENOSCOPY (EGD) WITH ESOPHAGEAL DILATION  multiple x32 per pt approx.--- last one 02-09-2015   recurrent anastomotic stricture post esophagectomy  . HIATAL HERNIA REPAIR  06/26/2012   Procedure: LAPAROSCOPIC REPAIR OF HIATAL HERNIA;  Surgeon: Madilyn Hook, DO;  Location: [facility];  Service: General;;  . Fatima Blank HERNIA REPAIR  06/26/2012   Procedure: HERNIA REPAIR INCISIONAL;   Surgeon: Madilyn Hook, DO;  Location: [facility];  Service: General;;  . INGUINAL HERNIA REPAIR Bilateral 1987  . JEJUNOSTOMY FEEDING TUBE  12/22/2012   Removed 12/ 2016  approx.  Marland Kitchen KNEE ARTHROSCOPY Left 1990  . MASS EXCISION Left 04/04/2013   Procedure: EXCISION OF SCALP MASS;  Surgeon: Madilyn Hook, DO;  Location: [facility];  Service: General;  Laterality: Left;   pilomatrixoma  . NESBIT PROCEDURE N/A 12/09/2016   Procedure: NESBIT PROCEDURE 16 DOT PLICATION;  Surgeon: Kathie Rhodes, MD;  Location[facility];  Service: Urology;  Laterality: N/A;  . TONSILLECTOMY  1960  . TRANSTHORACIC ECHOCARDIOGRAM  08-06-2015   [facility]   mild LVH, ef 50%/  mild RAE /  moderate LAE/  trivial AR, MR, PR,  and TR    Current Medications: Current Meds  Medication Sig  . amLODipine (NORVASC) 10 MG tablet Take 10 mg by mouth daily.  Marland Kitchen atorvastatin (LIPITOR) 20 MG tablet Take 20 mg by mouth daily.  . busPIRone (BUSPAR) 15 MG tablet Take 15 mg by mouth 2 (two) times daily.  Marland Kitchen FLUoxetine HCl 60 MG TABS Take 1 capsule by mouth daily.  Marland Kitchen LORazepam (ATIVAN) 1 MG tablet Take 1 mg by mouth 2 (two) times daily.   . magnesium oxide (MAG-OX) 400 MG tablet Take 400 mg by mouth daily.  . metoprolol tartrate (LOPRESSOR) 25 MG tablet TAKE 1 TABLET BY MOUTH TWICE A DAY  . NOVOLIN N RELION 100 UNIT/ML injection 15 Units BID  . pantoprazole (PROTONIX) 40 MG tablet Take 40 mg by mouth daily.  . ranitidine (ZANTAC) 150 MG tablet Take 150 mg by mouth as needed.   Marland Kitchen telmisartan (MICARDIS) 80 MG tablet Take 80 mg by mouth daily.  . temazepam (RESTORIL) 15 MG capsule Take 15 mg by mouth at bedtime as needed.  . traMADol (ULTRAM) 50 MG tablet Take 50 mg by mouth 2 (two) times daily as needed for moderate pain.  . vitamin B-12 (CYANOCOBALAMIN) 1000 MCG tablet Take 1,000 mcg by mouth every other day. Taking Mon, Wed, Fri  . warfarin (COUMADIN) 5 MG tablet TAKE 1 TO 1 AND 1/2 TABLETS BY MOUTH DAILY AS DIRECTED     Allergies:    Patient has no known allergies.   Social History   Socioeconomic History  . Marital status: Married    Spouse name: Not on file  . Number of children: Not on file  . Years of education: Not on file  . Highest education level: Not on file  Occupational History  . Not on file  Social Needs  . Financial resource strain: Not on file  . Food insecurity    Worry: Not on file    Inability: Not on file  . Transportation needs    Medical: Not on file    Non-medical: Not on file  Tobacco Use  . Smoking status: Never Smoker  . Smokeless tobacco: Never Used  Substance and Sexual Activity  . Alcohol use: Yes  Comment: socially  . Drug use: No  . Sexual activity: Not on file  Lifestyle  . Physical activity    Days per week: Not on file    Minutes per session: Not on file  . Stress: Not on file  Relationships  . Social Herbalist on phone: Not on file    Gets together: Not on file    Attends religious service: Not on file    Active member of club or organization: Not on file    Attends meetings of clubs or organizations: Not on file    Relationship status: Not on file  Other Topics Concern  . Not on file  Social History Narrative  . Not on file     Family History: The patient's family history includes Cancer in his father.  ROS:   Please see the history of present illness.    He is not having significant chest pain.  No lower extremity swelling.  Denies palpitations.  No recent episodes of atrial fib.  All other systems reviewed and are negative.  EKGs/Labs/Other Studies Reviewed:    The following studies were reviewed today: No recent or new vascular imaging studies.  EKG:  EKG sinus rhythm with normal EKG appearance.  Nonspecific ST abnormality.  No significant difference when compared to July 2019.  Recent Labs: No results found for requested labs within last 8760 hours.  Recent Lipid Panel    Component Value Date/Time   CHOL 157 04/13/2012 0825    TRIG 103 04/13/2012 0825   HDL 40 04/13/2012 0825   CHOLHDL 3.9 04/13/2012 0825   VLDL 21 04/13/2012 0825   LDLCALC 96 04/13/2012 0825    Physical Exam:    VS:  BP (!) 142/88   Pulse 72   Ht 6\' 1"  (1.854 m)   Wt 245 lb 3.2 oz (111.2 kg)   SpO2 97%   BMI 32.35 kg/m     Wt Readings from Last 3 Encounters:  04/19/19 245 lb 3.2 oz (111.2 kg)  03/14/18 234 lb 1.9 oz (106.2 kg)  12/29/16 233 lb 12.8 oz (106.1 kg)     GEN: Obese with recent weight gain. No acute distress HEENT: Normal NECK: No JVD. LYMPHATICS: No lymphadenopathy CARDIAC:  RRR without murmur, gallop, or edema. VASCULAR:  Normal Pulses. No bruits. RESPIRATORY:  Clear to auscultation without rales, wheezing or rhonchi  ABDOMEN: Soft, non-tender, non-distended, No pulsatile mass, MUSCULOSKELETAL: No deformity  SKIN: Warm and dry NEUROLOGIC:  Alert and oriented x 3 PSYCHIATRIC:  Normal affect   ASSESSMENT:    1. DOE (dyspnea on exertion)   2. Chest tightness   3. Cough   4. TIA (transient ischemic attack)   5. Long term (current) use of anticoagulants   6. Hypertension, benign   7. Paroxysmal atrial fibrillation (HCC)   8. Educated About Covid-19 Virus Infection    PLAN:    In order of problems listed above:  1. Recent chest x-ray was done for cough and dyspnea and revealed findings compatible with CHF.  We will plan to do a BNP today and get a 2D Doppler echocardiogram performed. 2. If echo shows normal wall motion and BNP is normal he will need to have a stress test of some sort done to rule out myocardial ischemia. 3. Cough is significantly improved off ACE inhibitor therapy. 4. Not addressed. 5. He endorses compliance. 6. Blood pressure 128/72. 7. Continue dofetilide therapy. 8. Social distancing, masking, and handwashing is encouraged.  Medication Adjustments/Labs and Tests Ordered: Current medicines are reviewed at length with the patient today.  Concerns regarding medicines are outlined  above.  Orders Placed This Encounter  Procedures  . EKG 12-Lead   No orders of the defined types were placed in this encounter.   There are no Patient Instructions on file for this visit.   Signed, [name], MD  04/19/2019 4:20 PM    [facility]

## 2019-04-19 ENCOUNTER — Other Ambulatory Visit: Payer: Self-pay

## 2019-04-19 ENCOUNTER — Encounter: Payer: Self-pay | Admitting: Interventional Cardiology

## 2019-04-19 ENCOUNTER — Ambulatory Visit (INDEPENDENT_AMBULATORY_CARE_PROVIDER_SITE_OTHER): Payer: Medicare Other | Admitting: Interventional Cardiology

## 2019-04-19 VITALS — BP 142/88 | HR 72 | Ht 73.0 in | Wt 245.2 lb

## 2019-04-19 DIAGNOSIS — Z7901 Long term (current) use of anticoagulants: Secondary | ICD-10-CM | POA: Diagnosis not present

## 2019-04-19 DIAGNOSIS — G459 Transient cerebral ischemic attack, unspecified: Secondary | ICD-10-CM

## 2019-04-19 DIAGNOSIS — R0609 Other forms of dyspnea: Secondary | ICD-10-CM

## 2019-04-19 DIAGNOSIS — R0789 Other chest pain: Secondary | ICD-10-CM | POA: Diagnosis not present

## 2019-04-19 DIAGNOSIS — R05 Cough: Secondary | ICD-10-CM

## 2019-04-19 DIAGNOSIS — I10 Essential (primary) hypertension: Secondary | ICD-10-CM

## 2019-04-19 DIAGNOSIS — I480 Paroxysmal atrial fibrillation: Secondary | ICD-10-CM

## 2019-04-19 DIAGNOSIS — R059 Cough, unspecified: Secondary | ICD-10-CM

## 2019-04-19 DIAGNOSIS — Z7189 Other specified counseling: Secondary | ICD-10-CM | POA: Diagnosis not present

## 2019-04-19 DIAGNOSIS — R0600 Dyspnea, unspecified: Secondary | ICD-10-CM

## 2019-04-19 NOTE — Patient Instructions (Addendum)
Medication Instructions:  Your physician recommends that you continue on your current medications as directed. Please refer to the Current Medication list given to you today.  If you need a refill on your cardiac medications before your next appointment, please call your pharmacy.   Lab work: Pro BNP today  If you have labs (blood work) drawn today and your tests are completely normal, you will receive your results only by: Marland Kitchen MyChart Message (if you have MyChart) OR . A paper copy in the mail If you have any lab test that is abnormal or we need to change your treatment, we will call you to review the results.  Testing/Procedures: Your physician has requested that you have an echocardiogram. Echocardiography is a painless test that uses sound waves to create images of your heart. It provides your doctor with information about the size and shape of your heart and how well your heart's chambers and valves are working. This procedure takes approximately one hour. There are no restrictions for this procedure.   Follow-Up: At [facility], you and your health needs are our priority.  As part of our continuing mission to provide you with exceptional heart care, we have created designated Provider Care Teams.  These Care Teams include your primary Cardiologist (physician) and Advanced Practice Providers (APPs -  Physician Assistants and Nurse Practitioners) who all work together to provide you with the care you need, when you need it. You will need a follow up appointment in 6 weeks (Can have one of the held slots on 9/28).  Please call our office 2 months in advance to schedule this appointment.  You may see [name], MD or one of the following Advanced Practice Providers on your designated Care Team:   Truitt Merle, NP Cecilie Kicks, NP . Kathyrn Drown, NP  Any Other Special Instructions Will Be Listed Below (If Applicable).

## 2019-04-20 LAB — PRO B NATRIURETIC PEPTIDE: NT-Pro BNP: 291 pg/mL (ref 0–376)

## 2019-04-25 ENCOUNTER — Ambulatory Visit (HOSPITAL_COMMUNITY): Payer: Medicare Other | Attending: Cardiovascular Disease

## 2019-04-25 ENCOUNTER — Ambulatory Visit (INDEPENDENT_AMBULATORY_CARE_PROVIDER_SITE_OTHER): Payer: Medicare Other | Admitting: *Deleted

## 2019-04-25 ENCOUNTER — Other Ambulatory Visit: Payer: Self-pay

## 2019-04-25 DIAGNOSIS — I480 Paroxysmal atrial fibrillation: Secondary | ICD-10-CM | POA: Diagnosis not present

## 2019-04-25 DIAGNOSIS — Z5181 Encounter for therapeutic drug level monitoring: Secondary | ICD-10-CM | POA: Diagnosis not present

## 2019-04-25 DIAGNOSIS — G459 Transient cerebral ischemic attack, unspecified: Secondary | ICD-10-CM

## 2019-04-25 DIAGNOSIS — R0600 Dyspnea, unspecified: Secondary | ICD-10-CM

## 2019-04-25 DIAGNOSIS — R0609 Other forms of dyspnea: Secondary | ICD-10-CM | POA: Insufficient documentation

## 2019-04-25 LAB — POCT INR: INR: 3 (ref 2.0–3.0)

## 2019-04-25 NOTE — Patient Instructions (Signed)
Description   Continue taking 1 tablet daily except 1.5 tablets on Mondays and Fridays. Recheck in 4 weeks. Call clinic with any concerns [phone]

## 2019-04-26 ENCOUNTER — Telehealth: Payer: Self-pay | Admitting: *Deleted

## 2019-04-26 ENCOUNTER — Encounter: Payer: Self-pay | Admitting: *Deleted

## 2019-04-26 DIAGNOSIS — R0602 Shortness of breath: Secondary | ICD-10-CM

## 2019-04-26 DIAGNOSIS — E785 Hyperlipidemia, unspecified: Secondary | ICD-10-CM

## 2019-04-26 DIAGNOSIS — R7989 Other specified abnormal findings of blood chemistry: Secondary | ICD-10-CM

## 2019-04-26 DIAGNOSIS — I10 Essential (primary) hypertension: Secondary | ICD-10-CM

## 2019-04-26 DIAGNOSIS — I480 Paroxysmal atrial fibrillation: Secondary | ICD-10-CM

## 2019-04-26 NOTE — Telephone Encounter (Signed)
-----   Message from Belva Crome, MD sent at 04/25/2019  4:49 PM EDT ----- Let the patient know that the heart size and function is normal.We need to do evaluation of the heart arteries. Please schedule Coronary CT with  Morphology and FFR if needed. A copy will be sent to [name], MD

## 2019-04-26 NOTE — Telephone Encounter (Signed)
Spoke with pt and went over results and recommendations.  Advised I will place order for CT and mail instructions.  Advised to call if any questions.  Pt verbalized understanding and was in agreement with this plan.

## 2019-04-29 DIAGNOSIS — F332 Major depressive disorder, recurrent severe without psychotic features: Secondary | ICD-10-CM | POA: Diagnosis not present

## 2019-05-02 ENCOUNTER — Other Ambulatory Visit: Payer: Medicare Other

## 2019-05-02 DIAGNOSIS — Z8501 Personal history of malignant neoplasm of esophagus: Secondary | ICD-10-CM | POA: Diagnosis not present

## 2019-05-02 NOTE — Telephone Encounter (Signed)
Called pt regarding his my chart message about his upcoming CT scan. Pt thankful for information regarding the CT machine size/lay out as he does not do well in confined spaces.

## 2019-05-03 ENCOUNTER — Other Ambulatory Visit: Payer: Self-pay

## 2019-05-03 ENCOUNTER — Other Ambulatory Visit: Payer: Medicare Other | Admitting: *Deleted

## 2019-05-03 DIAGNOSIS — R0602 Shortness of breath: Secondary | ICD-10-CM | POA: Diagnosis not present

## 2019-05-03 LAB — BASIC METABOLIC PANEL
BUN/Creatinine Ratio: 12 (ref 10–24)
BUN: 17 mg/dL (ref 8–27)
CO2: 26 mmol/L (ref 20–29)
Calcium: 9.4 mg/dL (ref 8.6–10.2)
Chloride: 94 mmol/L — ABNORMAL LOW (ref 96–106)
Creatinine, Ser: 1.42 mg/dL — ABNORMAL HIGH (ref 0.76–1.27)
GFR calc Af Amer: 59 mL/min/{1.73_m2} — ABNORMAL LOW (ref >59)
GFR calc non Af Amer: 51 mL/min/{1.73_m2} — ABNORMAL LOW (ref >59)
Glucose: 216 mg/dL — ABNORMAL HIGH (ref 65–99)
Potassium: 4.6 mmol/L (ref 3.5–5.2)
Sodium: 134 mmol/L (ref 134–144)

## 2019-05-07 DIAGNOSIS — I10 Essential (primary) hypertension: Secondary | ICD-10-CM | POA: Diagnosis not present

## 2019-05-07 DIAGNOSIS — R9389 Abnormal findings on diagnostic imaging of other specified body structures: Secondary | ICD-10-CM | POA: Diagnosis not present

## 2019-05-07 NOTE — Telephone Encounter (Signed)
Spoke with pt about labs results and recommendations.  Pt verbalized understanding.

## 2019-05-07 NOTE — Addendum Note (Signed)
Addended by: Loren Racer on: 05/07/2019 09:22 AM   Modules accepted: Orders

## 2019-05-07 NOTE — Telephone Encounter (Signed)
Follow up ° ° °Patient states that he is returning your call. Please call. ° ° °

## 2019-05-08 ENCOUNTER — Telehealth (HOSPITAL_COMMUNITY): Payer: Self-pay | Admitting: Emergency Medicine

## 2019-05-08 NOTE — Telephone Encounter (Signed)
Left message on voicemail with name and callback number Rechel Delosreyes RN Navigator Cardiac Imaging [facility] [phone] Office [phone] Cell  

## 2019-05-08 NOTE — Telephone Encounter (Signed)
Reaching out to patient to offer assistance regarding upcoming cardiac imaging study; pt verbalizes understanding of appt date/time, parking situation and where to check in, pre-test NPO status and medications ordered, and verified current allergies; name and call back number provided for further questions should they arise Leslie Jester RN Navigator Cardiac Imaging [facility] [phone] office [phone] cell 

## 2019-05-09 ENCOUNTER — Ambulatory Visit (HOSPITAL_COMMUNITY): Admission: RE | Admit: 2019-05-09 | Payer: Medicare Other | Source: Ambulatory Visit

## 2019-05-09 ENCOUNTER — Other Ambulatory Visit: Payer: Self-pay

## 2019-05-09 ENCOUNTER — Ambulatory Visit (HOSPITAL_COMMUNITY)
Admission: RE | Admit: 2019-05-09 | Discharge: 2019-05-09 | Disposition: A | Discharge status: Home / Self Care | Payer: Medicare Other | Source: Ambulatory Visit | Attending: Interventional Cardiology | Admitting: Interventional Cardiology

## 2019-05-09 DIAGNOSIS — I700 Atherosclerosis of aorta: Secondary | ICD-10-CM | POA: Diagnosis not present

## 2019-05-09 DIAGNOSIS — I480 Paroxysmal atrial fibrillation: Secondary | ICD-10-CM

## 2019-05-09 DIAGNOSIS — E785 Hyperlipidemia, unspecified: Secondary | ICD-10-CM | POA: Insufficient documentation

## 2019-05-09 DIAGNOSIS — I10 Essential (primary) hypertension: Secondary | ICD-10-CM

## 2019-05-09 DIAGNOSIS — R0602 Shortness of breath: Secondary | ICD-10-CM

## 2019-05-09 MED ORDER — NITROGLYCERIN 0.4 MG SL SUBL
0.8000 mg | SUBLINGUAL_TABLET | Freq: Once | SUBLINGUAL | Status: AC
  Administered 2019-05-09: 0.8 mg via SUBLINGUAL
  Filled 2019-05-09: qty 25

## 2019-05-09 MED ORDER — NITROGLYCERIN 0.4 MG SL SUBL
SUBLINGUAL_TABLET | SUBLINGUAL | Status: AC
  Filled 2019-05-09: qty 2

## 2019-05-09 MED ORDER — IOHEXOL 350 MG/ML SOLN
80.0000 mL | Freq: Once | INTRAVENOUS | Status: AC | PRN
  Administered 2019-05-09: 08:00:00 80 mL via INTRAVENOUS

## 2019-05-09 NOTE — Progress Notes (Signed)
Ambulated patient around nurses hallway, states he feels normal. Denies any complaints. Patient informed Probation officer he has a ride home.

## 2019-05-09 NOTE — Progress Notes (Signed)
Ct complete. Patient denies any complaints. Offered snack and drink to patient.

## 2019-05-09 NOTE — Progress Notes (Signed)
BP noted low, 84/60. Patient states he feels fine with no complaints. Patients feet elevated, and more snacks and drink encouraged. Patient stable.

## 2019-05-14 ENCOUNTER — Other Ambulatory Visit: Payer: Self-pay

## 2019-05-14 ENCOUNTER — Other Ambulatory Visit: Payer: Self-pay | Admitting: *Deleted

## 2019-05-14 ENCOUNTER — Other Ambulatory Visit: Payer: Medicare Other

## 2019-05-14 DIAGNOSIS — R7989 Other specified abnormal findings of blood chemistry: Secondary | ICD-10-CM | POA: Diagnosis not present

## 2019-05-14 LAB — BASIC METABOLIC PANEL
BUN/Creatinine Ratio: 15 (ref 10–24)
BUN: 19 mg/dL (ref 8–27)
CO2: 24 mmol/L (ref 20–29)
Calcium: 9.3 mg/dL (ref 8.6–10.2)
Chloride: 99 mmol/L (ref 96–106)
Creatinine, Ser: 1.29 mg/dL — ABNORMAL HIGH (ref 0.76–1.27)
GFR calc Af Amer: 66 mL/min/{1.73_m2} (ref >59)
GFR calc non Af Amer: 57 mL/min/{1.73_m2} — ABNORMAL LOW (ref >59)
Glucose: 159 mg/dL — ABNORMAL HIGH (ref 65–99)
Potassium: 4.9 mmol/L (ref 3.5–5.2)
Sodium: 135 mmol/L (ref 134–144)

## 2019-05-14 MED ORDER — ISOSORBIDE MONONITRATE ER 30 MG PO TB24: 30.0000 mg | ORAL_TABLET | Freq: Every day | ORAL | 3 refills | Status: DC

## 2019-05-15 DIAGNOSIS — Z23 Encounter for immunization: Secondary | ICD-10-CM | POA: Diagnosis not present

## 2019-05-23 ENCOUNTER — Other Ambulatory Visit: Payer: Self-pay

## 2019-05-23 ENCOUNTER — Ambulatory Visit (INDEPENDENT_AMBULATORY_CARE_PROVIDER_SITE_OTHER): Payer: Medicare Other | Admitting: *Deleted

## 2019-05-23 DIAGNOSIS — G459 Transient cerebral ischemic attack, unspecified: Secondary | ICD-10-CM

## 2019-05-23 DIAGNOSIS — I480 Paroxysmal atrial fibrillation: Secondary | ICD-10-CM | POA: Diagnosis not present

## 2019-05-23 DIAGNOSIS — Z5181 Encounter for therapeutic drug level monitoring: Secondary | ICD-10-CM

## 2019-05-23 LAB — POCT INR: INR: 3.8 — AB (ref 2.0–3.0)

## 2019-05-23 NOTE — Patient Instructions (Signed)
Description   Hold Coumadin tomorrow, then start taking 1 tablet daily except 1.5 tablets on Mondays. Recheck in 3 weeks. Call clinic with any concerns [phone]

## 2019-05-27 DIAGNOSIS — E1142 Type 2 diabetes mellitus with diabetic polyneuropathy: Secondary | ICD-10-CM | POA: Diagnosis not present

## 2019-05-27 DIAGNOSIS — I10 Essential (primary) hypertension: Secondary | ICD-10-CM | POA: Diagnosis not present

## 2019-05-27 DIAGNOSIS — Z794 Long term (current) use of insulin: Secondary | ICD-10-CM | POA: Diagnosis not present

## 2019-05-27 DIAGNOSIS — F331 Major depressive disorder, recurrent, moderate: Secondary | ICD-10-CM | POA: Diagnosis not present

## 2019-05-27 DIAGNOSIS — G459 Transient cerebral ischemic attack, unspecified: Secondary | ICD-10-CM | POA: Diagnosis not present

## 2019-05-27 DIAGNOSIS — E113293 Type 2 diabetes mellitus with mild nonproliferative diabetic retinopathy without macular edema, bilateral: Secondary | ICD-10-CM | POA: Diagnosis not present

## 2019-05-27 DIAGNOSIS — E782 Mixed hyperlipidemia: Secondary | ICD-10-CM | POA: Diagnosis not present

## 2019-05-27 DIAGNOSIS — N400 Enlarged prostate without lower urinary tract symptoms: Secondary | ICD-10-CM | POA: Diagnosis not present

## 2019-05-27 DIAGNOSIS — F3341 Major depressive disorder, recurrent, in partial remission: Secondary | ICD-10-CM | POA: Diagnosis not present

## 2019-05-27 DIAGNOSIS — F325 Major depressive disorder, single episode, in full remission: Secondary | ICD-10-CM | POA: Diagnosis not present

## 2019-05-27 DIAGNOSIS — I4891 Unspecified atrial fibrillation: Secondary | ICD-10-CM | POA: Diagnosis not present

## 2019-06-02 NOTE — Progress Notes (Signed)
Cardiology Office Note:    Date:  06/03/2019   ID:  Lawrence Fudge., DOB 05/11/1951, MRN [id number]  PCP:  [name], MD  Cardiologist:  [name], MD   Referring MD: [name], MD   Chief Complaint  Patient presents with  . Shortness of Breath    History of Present Illness:    Lawrence Lenney. is a 68 y.o. male with a hx of atrial fibrillation (Dr.Daubert at [facility]) , dofetilide therapy, chronic anticoagulation, hypertension, TIA, and hyperlipidemia.. Prior history of esophagectomy for esophageal cancer 2013.He had atrial fibrillation at [facility] in 2018 and has had no subsequent atrial fib. Dofetilide has been discontinued since November 2018.  [name] is still experiencing dyspnea and fatigue on exertion.  He does acknowledge decreased exertional activities and weight gain since the pandemic started.  Because of these complaints, we did cardiac evaluation.  The work-up has included an echocardiogram and coronary CT with morphology.  Systolic function is normal but there is mild diastolic abnormality.  Coronaries are widely patent with mild plaque.  We discussed the implications of these studies.  I have encouraged him to begin a graded attempt to improve endurance and lose weight.  Past Medical History:  Diagnosis Date  . Bradycardia   . GERD (gastroesophageal reflux disease)   . H/O hiatal hernia    s/p  repair 06-26-2012  . History of esophagectomy 08-21-2012  AT [facility]  . History of kidney stones 2006  . History of malignant neoplasm of esophagus ONCOLOGIST-  DR D'AMICO AT [facility]-- PER LAST NOTE 01/ 2018  NO RECURRENCE   dx 10/ 2013  Stage 2A (T2 N0)  08-21-2012 s/p  esophagectomy w/ gastric pull-through [facility]) post residual recurrent anastomotic stricture (multiple EGD w/ dilations)  . History of transient ischemic attack (TIA) 2003   no residual  . Hypertension   . Long term (current) use of  anticoagulants    xarelto  . On dofetilide therapy   . PAF (paroxysmal atrial fibrillation) [facility]) first dx 2009   primary cardiologist-  dr Daneen Schick /  EP cardiologist -- dr Lurene Shadow ([facility])  s/p  afib ablation 08-02-2016  . Pancreatic cyst    x2   . Peyronie's disease   . Presence of permanent cardiac pacemaker implanted 05-01-2014  at [facility] follows  . Pulmonary nodule, right    upper and middle lobes --  per last CT stable  . Type 2 diabetes mellitus treated with insulin ([place])   . Wears glasses     Past Surgical History:  Procedure Laterality Date  . ANTERIOR CERVICAL DECOMP/DISCECTOMY FUSION N/A 06/02/2016   Procedure: ANTERIOR CERVICAL DECOMPRESSION FUSION CERVICAL 5-6, CERVICAL 6-7 WITH INSTRUMENTATION AND ALLOGRAFT;  Surgeon: Phylliss Bob, MD;  Location: [place];  Service: Orthopedics;  Laterality: N/A;  ANTERIOR CERVICAL DECOMPRESSION FUSION CERVICAL 5-6, CERVICAL 6-7 WITH INSTRUMENTATION AND ALLOGRAFT  . BIOPSY  06/26/2012   Procedure: BIOPSY;  Surgeon: Madilyn Hook, DO;  Location: [facility];  Service: General;;  . CARDIAC ELECTROPHYSIOLOGY STUDY AND ABLATION  08-02-2016   [facility]   (dr Lurene Shadow)   pulmonary veins isolation and ablation afib  . CARDIAC PACEMAKER PLACEMENT  05-01-2014   [facility]  . CHOLECYSTECTOMY  1988  . COLONOSCOPY WITH PROPOFOL N/A 07/13/2015   Procedure: COLONOSCOPY WITH PROPOFOL;  Surgeon: Garlan Fair, MD;  Location: [facility];  Service: Endoscopy;  Laterality: N/A;  . ESOPHAGECTOMY  08-21-2012   [facility]   w/ gastric pull-through  . ESOPHAGOGASTRODUODENOSCOPY (EGD) WITH ESOPHAGEAL DILATION  multiple x32 per pt approx.--- last one 02-09-2015   recurrent anastomotic stricture post esophagectomy  . HIATAL HERNIA REPAIR  06/26/2012   Procedure: LAPAROSCOPIC REPAIR OF HIATAL HERNIA;  Surgeon: Madilyn Hook, DO;  Location: [facility];  Service: General;;  . Fatima Blank HERNIA REPAIR  06/26/2012   Procedure: HERNIA REPAIR INCISIONAL;   Surgeon: Madilyn Hook, DO;  Location: [facility];  Service: General;;  . INGUINAL HERNIA REPAIR Bilateral 1987  . JEJUNOSTOMY FEEDING TUBE  12/22/2012   Removed 12/ 2016  approx.  Marland Kitchen KNEE ARTHROSCOPY Left 1990  . MASS EXCISION Left 04/04/2013   Procedure: EXCISION OF SCALP MASS;  Surgeon: Madilyn Hook, DO;  Location: [facility];  Service: General;  Laterality: Left;   pilomatrixoma  . NESBIT PROCEDURE N/A 12/09/2016   Procedure: NESBIT PROCEDURE 16 DOT PLICATION;  Surgeon: Kathie Rhodes, MD;  Location: [facility];  Service: Urology;  Laterality: N/A;  . TONSILLECTOMY  1960  . TRANSTHORACIC ECHOCARDIOGRAM  08-06-2015   [facility]   mild LVH, ef 50%/  mild RAE /  moderate LAE/  trivial AR, MR, PR,  and TR    Current Medications: Current Meds  Medication Sig  . amLODipine (NORVASC) 10 MG tablet Take 10 mg by mouth daily.  Marland Kitchen atorvastatin (LIPITOR) 20 MG tablet Take 20 mg by mouth daily.  . busPIRone (BUSPAR) 15 MG tablet Take 15 mg by mouth 2 (two) times daily.  . famotidine (PEPCID) 20 MG tablet Take 20 mg by mouth as needed.  Marland Kitchen FLUoxetine HCl 60 MG TABS Take 1 capsule by mouth daily.  . hydrochlorothiazide (MICROZIDE) 12.5 MG capsule Take 12.5 mg by mouth daily.  . isosorbide mononitrate (IMDUR) 30 MG 24 hr tablet Take 1 tablet (30 mg total) by mouth daily.  Marland Kitchen LORazepam (ATIVAN) 1 MG tablet Take 1 mg by mouth 2 (two) times daily.   . magnesium oxide (MAG-OX) 400 MG tablet Take 400 mg by mouth daily.  . metoprolol tartrate (LOPRESSOR) 25 MG tablet TAKE 1 TABLET BY MOUTH TWICE A DAY  . NOVOLIN N RELION 100 UNIT/ML injection 15 Units BID  . pantoprazole (PROTONIX) 40 MG tablet Take 40 mg by mouth daily.  Marland Kitchen telmisartan (MICARDIS) 80 MG tablet Take 80 mg by mouth daily.  . temazepam (RESTORIL) 15 MG capsule Take 15 mg by mouth at bedtime as needed.  . traMADol (ULTRAM) 50 MG tablet Take 50 mg by mouth 2 (two) times daily as needed for moderate pain.  . vitamin B-12 (CYANOCOBALAMIN) 1000 MCG  tablet Take 1,000 mcg by mouth every other day. Taking Mon, Wed, Fri  . warfarin (COUMADIN) 5 MG tablet TAKE 1 TO 1 AND 1/2 TABLETS BY MOUTH DAILY AS DIRECTED     Allergies:   Patient has no known allergies.   Social History   Socioeconomic History  . Marital status: Married    Spouse name: Not on file  . Number of children: Not on file  . Years of education: Not on file  . Highest education level: Not on file  Occupational History  . Not on file  Social Needs  . Financial resource strain: Not on file  . Food insecurity    Worry: Not on file    Inability: Not on file  . Transportation needs    Medical: Not on file    Non-medical: Not on file  Tobacco Use  . Smoking status:  Never Smoker  . Smokeless tobacco: Never Used  Substance and Sexual Activity  . Alcohol use: Yes    Comment: socially  . Drug use: No  . Sexual activity: Not on file  Lifestyle  . Physical activity    Days per week: Not on file    Minutes per session: Not on file  . Stress: Not on file  Relationships  . Social Herbalist on phone: Not on file    Gets together: Not on file    Attends religious service: Not on file    Active member of club or organization: Not on file    Attends meetings of clubs or organizations: Not on file    Relationship status: Not on file  Other Topics Concern  . Not on file  Social History Narrative  . Not on file     Family History: The patient's family history includes Cancer in his father.  ROS:   Please see the history of present illness.    Concerned that his loss of exertional tolerance is related to a significant underlying heart problem.  All other systems reviewed and are negative.  EKGs/Labs/Other Studies Reviewed:    The following studies were reviewed today:  Coronary CT with Morphology 05/09/2019: IMPRESSION: 1. Multivessel non-obstructive CAD, with mild non-obstructive mixed plaque of the the LAD, RCA and OM1 and moderate mixed stenosis of  a small Ramus branch, not amenable to intervention. CADRADS 2/3.  2. Coronary calcium score of 357. This was 73rd percentile for age and sex matched control.  3. Normal coronary origin with right dominance.  4. Trileaflet aortic valve with calcification of the right coronary cusp and annulus.  5. Dilated main PA measuring 32 mm.  6. Dual chamber pacemaker noted with lead tips in the expected Position  Echocardiogram 2020: IMPRESSIONS    1. The left ventricle has normal systolic function, with an ejection fraction of 55-60%. The cavity size was normal. There is moderate asymmetric left ventricular hypertrophy. Left ventricular diastolic Doppler parameters are consistent with  pseudonormalization.  2. The right ventricle has normal systolic function. The cavity was normal. There is no increase in right ventricular wall thickness.  3. The aortic valve is abnormal. Moderate thickening of the aortic valve. Mild calcification of the aortic valve.  4. The aorta is normal unless otherwise noted.  5. The aortic root and ascending aorta are normal in size and structure.  6. RA pacing wire present.  7. The atrial septum is grossly normal.  8. The average left ventricular global longitudinal strain is -18.1 %.  EKG:  EKG not repeated  Recent Labs: 04/19/2019: NT-Pro BNP 291 05/14/2019: BUN 19; Creatinine, Ser 1.29; Potassium 4.9; Sodium 135  Recent Lipid Panel    Component Value Date/Time   CHOL 157 04/13/2012 0825   TRIG 103 04/13/2012 0825   HDL 40 04/13/2012 0825   CHOLHDL 3.9 04/13/2012 0825   VLDL 21 04/13/2012 0825   LDLCALC 96 04/13/2012 0825    Physical Exam:    VS:  BP 132/82   Pulse 63   Ht 6\' 1"  (1.854 m)   Wt 246 lb (111.6 kg)   SpO2 97%   BMI 32.46 kg/m     Wt Readings from Last 3 Encounters:  06/03/19 246 lb (111.6 kg)  04/19/19 245 lb 3.2 oz (111.2 kg)  03/14/18 234 lb 1.9 oz (106.2 kg)     GEN: Obese with greater than 15 pound weight gain over the  past year.. No acute distress HEENT: Normal NECK: No JVD. LYMPHATICS: No lymphadenopathy CARDIAC:  RRR without murmur, gallop, or edema. VASCULAR:  Normal Pulses. No bruits. RESPIRATORY:  Clear to auscultation without rales, wheezing or rhonchi  ABDOMEN: Soft, non-tender, non-distended, No pulsatile mass, MUSCULOSKELETAL: No deformity  SKIN: Warm and dry NEUROLOGIC:  Alert and oriented x 3 PSYCHIATRIC:  Normal affect   ASSESSMENT:    1. Paroxysmal atrial fibrillation (HCC)   2. Shortness of breath   3. TIA (transient ischemic attack)   4. Hypertension, benign   5. Hyperlipidemia, unspecified hyperlipidemia type   6. Chest tightness   7. Long term (current) use of anticoagulants   8. S/P ablation of atrial fibrillation   9. Educated About Covid-19 Virus Infection    PLAN:    In order of problems listed above:  1. No clinical recurrence 2. Likely related to deconditioning.  He does have diastolic dysfunction but no evidence of volume overload. 3. Not addressed 4. Blood pressure control currently. 5. Not addressed 6. This is not really a clinical problem. 7. No bleeding complications.  Overall education and awareness concerning primary risk prevention was discussed in detail: LDL less than 70, hemoglobin A1c less than 7, blood pressure target less than 130/80 mmHg, >150 minutes of moderate aerobic activity per week, avoidance of smoking, weight control (via diet and exercise), and continued surveillance/management of/for obstructive sleep apnea.     Medication Adjustments/Labs and Tests Ordered: Current medicines are reviewed at length with the patient today.  Concerns regarding medicines are outlined above.  No orders of the defined types were placed in this encounter.  No orders of the defined types were placed in this encounter.   Patient Instructions  Medication Instructions:  Your physician recommends that you continue on your current medications as directed.  Please refer to the Current Medication list given to you today.  If you need a refill on your cardiac medications before your next appointment, please call your pharmacy.   Lab work: None If you have labs (blood work) drawn today and your tests are completely normal, you will receive your results only by: Marland Kitchen MyChart Message (if you have MyChart) OR . A paper copy in the mail If you have any lab test that is abnormal or we need to change your treatment, we will call you to review the results.  Testing/Procedures: None  Follow-Up: At [facility], you and your health needs are our priority.  As part of our continuing mission to provide you with exceptional heart care, we have created designated Provider Care Teams.  These Care Teams include your primary Cardiologist (physician) and Advanced Practice Providers (APPs -  Physician Assistants and Nurse Practitioners) who all work together to provide you with the care you need, when you need it. You will need a follow up appointment in 9 months.  Please call our office 2 months in advance to schedule this appointment.  You may see [name], MD or one of the following Advanced Practice Providers on your designated Care Team:   Truitt Merle, NP Cecilie Kicks, NP . Kathyrn Drown, NP  Any Other Special Instructions Will Be Listed Below (If Applicable).  Your provider recommends that you maintain 150 minutes per week of moderate aerobic activity.      Signed, [name], MD  06/03/2019 11:17 AM    [place]

## 2019-06-03 ENCOUNTER — Encounter: Payer: Self-pay | Admitting: Interventional Cardiology

## 2019-06-03 ENCOUNTER — Ambulatory Visit (INDEPENDENT_AMBULATORY_CARE_PROVIDER_SITE_OTHER): Payer: Medicare Other | Admitting: Interventional Cardiology

## 2019-06-03 ENCOUNTER — Other Ambulatory Visit: Payer: Self-pay

## 2019-06-03 ENCOUNTER — Ambulatory Visit: Payer: Medicare Other | Admitting: Interventional Cardiology

## 2019-06-03 VITALS — BP 132/82 | HR 63 | Ht 73.0 in | Wt 246.0 lb

## 2019-06-03 DIAGNOSIS — Z7189 Other specified counseling: Secondary | ICD-10-CM

## 2019-06-03 DIAGNOSIS — R0602 Shortness of breath: Secondary | ICD-10-CM | POA: Diagnosis not present

## 2019-06-03 DIAGNOSIS — Z9889 Other specified postprocedural states: Secondary | ICD-10-CM | POA: Diagnosis not present

## 2019-06-03 DIAGNOSIS — Z8679 Personal history of other diseases of the circulatory system: Secondary | ICD-10-CM

## 2019-06-03 DIAGNOSIS — G459 Transient cerebral ischemic attack, unspecified: Secondary | ICD-10-CM | POA: Diagnosis not present

## 2019-06-03 DIAGNOSIS — E785 Hyperlipidemia, unspecified: Secondary | ICD-10-CM

## 2019-06-03 DIAGNOSIS — I480 Paroxysmal atrial fibrillation: Secondary | ICD-10-CM

## 2019-06-03 DIAGNOSIS — Z7901 Long term (current) use of anticoagulants: Secondary | ICD-10-CM

## 2019-06-03 DIAGNOSIS — R0789 Other chest pain: Secondary | ICD-10-CM | POA: Diagnosis not present

## 2019-06-03 DIAGNOSIS — I10 Essential (primary) hypertension: Secondary | ICD-10-CM

## 2019-06-03 NOTE — Patient Instructions (Signed)
Medication Instructions:  Your physician recommends that you continue on your current medications as directed. Please refer to the Current Medication list given to you today.  If you need a refill on your cardiac medications before your next appointment, please call your pharmacy.   Lab work: None If you have labs (blood work) drawn today and your tests are completely normal, you will receive your results only by: Marland Kitchen MyChart Message (if you have MyChart) OR . A paper copy in the mail If you have any lab test that is abnormal or we need to change your treatment, we will call you to review the results.  Testing/Procedures: None  Follow-Up: At [facility], you and your health needs are our priority.  As part of our continuing mission to provide you with exceptional heart care, we have created designated Provider Care Teams.  These Care Teams include your primary Cardiologist (physician) and Advanced Practice Providers (APPs -  Physician Assistants and Nurse Practitioners) who all work together to provide you with the care you need, when you need it. You will need a follow up appointment in 9 months.  Please call our office 2 months in advance to schedule this appointment.  You may see [name], MD or one of the following Advanced Practice Providers on your designated Care Team:   Truitt Merle, NP Cecilie Kicks, NP . Kathyrn Drown, NP  Any Other Special Instructions Will Be Listed Below (If Applicable).  Your provider recommends that you maintain 150 minutes per week of moderate aerobic activity.

## 2019-06-13 ENCOUNTER — Ambulatory Visit (INDEPENDENT_AMBULATORY_CARE_PROVIDER_SITE_OTHER): Payer: Medicare Other

## 2019-06-13 ENCOUNTER — Other Ambulatory Visit: Payer: Self-pay

## 2019-06-13 DIAGNOSIS — Z5181 Encounter for therapeutic drug level monitoring: Secondary | ICD-10-CM | POA: Diagnosis not present

## 2019-06-13 DIAGNOSIS — I480 Paroxysmal atrial fibrillation: Secondary | ICD-10-CM

## 2019-06-13 DIAGNOSIS — G459 Transient cerebral ischemic attack, unspecified: Secondary | ICD-10-CM | POA: Diagnosis not present

## 2019-06-13 LAB — POCT INR: INR: 4.7 — AB (ref 2.0–3.0)

## 2019-06-13 NOTE — Patient Instructions (Signed)
Description   Skip 2 dosages of Coumadin, then start taking 1 tablet daily.  Recheck in 2 weeks. Call clinic with any concerns [phone]

## 2019-06-17 DIAGNOSIS — I472 Ventricular tachycardia: Secondary | ICD-10-CM | POA: Diagnosis not present

## 2019-06-17 DIAGNOSIS — Z45018 Encounter for adjustment and management of other part of cardiac pacemaker: Secondary | ICD-10-CM | POA: Diagnosis not present

## 2019-06-17 DIAGNOSIS — Z950 Presence of cardiac pacemaker: Secondary | ICD-10-CM | POA: Diagnosis not present

## 2019-06-20 DIAGNOSIS — I4891 Unspecified atrial fibrillation: Secondary | ICD-10-CM | POA: Diagnosis not present

## 2019-06-20 DIAGNOSIS — E113293 Type 2 diabetes mellitus with mild nonproliferative diabetic retinopathy without macular edema, bilateral: Secondary | ICD-10-CM | POA: Diagnosis not present

## 2019-06-20 DIAGNOSIS — I10 Essential (primary) hypertension: Secondary | ICD-10-CM | POA: Diagnosis not present

## 2019-06-20 DIAGNOSIS — K219 Gastro-esophageal reflux disease without esophagitis: Secondary | ICD-10-CM | POA: Diagnosis not present

## 2019-06-20 DIAGNOSIS — E782 Mixed hyperlipidemia: Secondary | ICD-10-CM | POA: Diagnosis not present

## 2019-06-20 DIAGNOSIS — R05 Cough: Secondary | ICD-10-CM | POA: Diagnosis not present

## 2019-06-20 DIAGNOSIS — F411 Generalized anxiety disorder: Secondary | ICD-10-CM | POA: Diagnosis not present

## 2019-06-20 DIAGNOSIS — Z7289 Other problems related to lifestyle: Secondary | ICD-10-CM | POA: Diagnosis not present

## 2019-06-24 DIAGNOSIS — F332 Major depressive disorder, recurrent severe without psychotic features: Secondary | ICD-10-CM | POA: Diagnosis not present

## 2019-07-01 ENCOUNTER — Other Ambulatory Visit: Payer: Self-pay | Admitting: Interventional Cardiology

## 2019-07-01 NOTE — Telephone Encounter (Signed)
Called pt, he missed his last appt.  Rescheduled him for 07/03/19 at 8:45am.  Pt states he does not need a refill on his Warfarin at this time, he has plenty of medication presently.  Advised pt I will deny this refill and he will let us know when he needs it refilled again.

## 2019-07-04 DIAGNOSIS — R05 Cough: Secondary | ICD-10-CM | POA: Diagnosis not present

## 2019-07-05 DIAGNOSIS — E782 Mixed hyperlipidemia: Secondary | ICD-10-CM | POA: Diagnosis not present

## 2019-07-05 DIAGNOSIS — F331 Major depressive disorder, recurrent, moderate: Secondary | ICD-10-CM | POA: Diagnosis not present

## 2019-07-05 DIAGNOSIS — I10 Essential (primary) hypertension: Secondary | ICD-10-CM | POA: Diagnosis not present

## 2019-07-05 DIAGNOSIS — F3341 Major depressive disorder, recurrent, in partial remission: Secondary | ICD-10-CM | POA: Diagnosis not present

## 2019-07-05 DIAGNOSIS — R05 Cough: Secondary | ICD-10-CM | POA: Diagnosis not present

## 2019-07-05 DIAGNOSIS — I4891 Unspecified atrial fibrillation: Secondary | ICD-10-CM | POA: Diagnosis not present

## 2019-07-05 DIAGNOSIS — F325 Major depressive disorder, single episode, in full remission: Secondary | ICD-10-CM | POA: Diagnosis not present

## 2019-07-05 DIAGNOSIS — N400 Enlarged prostate without lower urinary tract symptoms: Secondary | ICD-10-CM | POA: Diagnosis not present

## 2019-07-05 DIAGNOSIS — E1142 Type 2 diabetes mellitus with diabetic polyneuropathy: Secondary | ICD-10-CM | POA: Diagnosis not present

## 2019-07-05 DIAGNOSIS — E113293 Type 2 diabetes mellitus with mild nonproliferative diabetic retinopathy without macular edema, bilateral: Secondary | ICD-10-CM | POA: Diagnosis not present

## 2019-07-05 DIAGNOSIS — G459 Transient cerebral ischemic attack, unspecified: Secondary | ICD-10-CM | POA: Diagnosis not present

## 2019-07-11 ENCOUNTER — Ambulatory Visit (INDEPENDENT_AMBULATORY_CARE_PROVIDER_SITE_OTHER): Payer: Medicare Other

## 2019-07-11 ENCOUNTER — Other Ambulatory Visit: Payer: Self-pay

## 2019-07-11 DIAGNOSIS — Z5181 Encounter for therapeutic drug level monitoring: Secondary | ICD-10-CM

## 2019-07-11 DIAGNOSIS — I480 Paroxysmal atrial fibrillation: Secondary | ICD-10-CM

## 2019-07-11 DIAGNOSIS — G459 Transient cerebral ischemic attack, unspecified: Secondary | ICD-10-CM

## 2019-07-11 LAB — POCT INR: INR: 2.7 (ref 2.0–3.0)

## 2019-07-11 NOTE — Patient Instructions (Signed)
Description   Continue on same dosage 1 tablet daily except 1.5 tablets on Mondays.  Recheck in 4 weeks. Call clinic with any concerns [phone]

## 2019-07-17 ENCOUNTER — Other Ambulatory Visit: Payer: Self-pay | Admitting: Interventional Cardiology

## 2019-07-18 DIAGNOSIS — F331 Major depressive disorder, recurrent, moderate: Secondary | ICD-10-CM | POA: Diagnosis not present

## 2019-07-18 DIAGNOSIS — E113293 Type 2 diabetes mellitus with mild nonproliferative diabetic retinopathy without macular edema, bilateral: Secondary | ICD-10-CM | POA: Diagnosis not present

## 2019-07-18 DIAGNOSIS — E1142 Type 2 diabetes mellitus with diabetic polyneuropathy: Secondary | ICD-10-CM | POA: Diagnosis not present

## 2019-07-18 DIAGNOSIS — I10 Essential (primary) hypertension: Secondary | ICD-10-CM | POA: Diagnosis not present

## 2019-07-18 DIAGNOSIS — E782 Mixed hyperlipidemia: Secondary | ICD-10-CM | POA: Diagnosis not present

## 2019-07-18 DIAGNOSIS — I4891 Unspecified atrial fibrillation: Secondary | ICD-10-CM | POA: Diagnosis not present

## 2019-07-18 DIAGNOSIS — G459 Transient cerebral ischemic attack, unspecified: Secondary | ICD-10-CM | POA: Diagnosis not present

## 2019-07-18 DIAGNOSIS — F325 Major depressive disorder, single episode, in full remission: Secondary | ICD-10-CM | POA: Diagnosis not present

## 2019-07-18 DIAGNOSIS — N400 Enlarged prostate without lower urinary tract symptoms: Secondary | ICD-10-CM | POA: Diagnosis not present

## 2019-07-18 DIAGNOSIS — F3341 Major depressive disorder, recurrent, in partial remission: Secondary | ICD-10-CM | POA: Diagnosis not present

## 2019-08-08 ENCOUNTER — Ambulatory Visit (INDEPENDENT_AMBULATORY_CARE_PROVIDER_SITE_OTHER): Payer: Medicare Other

## 2019-08-08 ENCOUNTER — Other Ambulatory Visit: Payer: Self-pay

## 2019-08-08 DIAGNOSIS — G459 Transient cerebral ischemic attack, unspecified: Secondary | ICD-10-CM

## 2019-08-08 DIAGNOSIS — I480 Paroxysmal atrial fibrillation: Secondary | ICD-10-CM

## 2019-08-08 DIAGNOSIS — Z5181 Encounter for therapeutic drug level monitoring: Secondary | ICD-10-CM

## 2019-08-08 LAB — POCT INR: INR: 3.9 — AB (ref 2.0–3.0)

## 2019-08-08 NOTE — Patient Instructions (Signed)
Description   Skip today's dosage of Coumadin, then start taking 1 tablet daily.  Recheck in 4 weeks. Call clinic with any concerns [phone]

## 2019-08-19 DIAGNOSIS — F332 Major depressive disorder, recurrent severe without psychotic features: Secondary | ICD-10-CM | POA: Diagnosis not present

## 2019-09-02 ENCOUNTER — Other Ambulatory Visit: Payer: Self-pay

## 2019-09-02 ENCOUNTER — Ambulatory Visit (INDEPENDENT_AMBULATORY_CARE_PROVIDER_SITE_OTHER): Payer: Medicare Other | Admitting: Pharmacist

## 2019-09-02 DIAGNOSIS — Z5181 Encounter for therapeutic drug level monitoring: Secondary | ICD-10-CM

## 2019-09-02 DIAGNOSIS — G459 Transient cerebral ischemic attack, unspecified: Secondary | ICD-10-CM

## 2019-09-02 DIAGNOSIS — I480 Paroxysmal atrial fibrillation: Secondary | ICD-10-CM

## 2019-09-02 LAB — POCT INR: INR: 2.7 (ref 2.0–3.0)

## 2019-09-02 NOTE — Patient Instructions (Signed)
Continue taking 1 tablet daily.  Recheck in 4 weeks. Call clinic with any concerns [phone]

## 2019-09-12 ENCOUNTER — Other Ambulatory Visit (HOSPITAL_COMMUNITY): Payer: Self-pay | Admitting: Psychiatry

## 2019-09-12 DIAGNOSIS — F332 Major depressive disorder, recurrent severe without psychotic features: Secondary | ICD-10-CM

## 2019-09-17 DIAGNOSIS — F331 Major depressive disorder, recurrent, moderate: Secondary | ICD-10-CM | POA: Diagnosis not present

## 2019-09-17 DIAGNOSIS — E782 Mixed hyperlipidemia: Secondary | ICD-10-CM | POA: Diagnosis not present

## 2019-09-17 DIAGNOSIS — I10 Essential (primary) hypertension: Secondary | ICD-10-CM | POA: Diagnosis not present

## 2019-09-17 DIAGNOSIS — G459 Transient cerebral ischemic attack, unspecified: Secondary | ICD-10-CM | POA: Diagnosis not present

## 2019-09-17 DIAGNOSIS — I4891 Unspecified atrial fibrillation: Secondary | ICD-10-CM | POA: Diagnosis not present

## 2019-09-17 DIAGNOSIS — F325 Major depressive disorder, single episode, in full remission: Secondary | ICD-10-CM | POA: Diagnosis not present

## 2019-09-17 DIAGNOSIS — E113293 Type 2 diabetes mellitus with mild nonproliferative diabetic retinopathy without macular edema, bilateral: Secondary | ICD-10-CM | POA: Diagnosis not present

## 2019-09-17 DIAGNOSIS — N400 Enlarged prostate without lower urinary tract symptoms: Secondary | ICD-10-CM | POA: Diagnosis not present

## 2019-09-17 DIAGNOSIS — F3341 Major depressive disorder, recurrent, in partial remission: Secondary | ICD-10-CM | POA: Diagnosis not present

## 2019-09-17 DIAGNOSIS — E1142 Type 2 diabetes mellitus with diabetic polyneuropathy: Secondary | ICD-10-CM | POA: Diagnosis not present

## 2019-09-18 DIAGNOSIS — Z03818 Encounter for observation for suspected exposure to other biological agents ruled out: Secondary | ICD-10-CM | POA: Diagnosis not present

## 2019-09-26 DIAGNOSIS — Z45018 Encounter for adjustment and management of other part of cardiac pacemaker: Secondary | ICD-10-CM | POA: Diagnosis not present

## 2019-09-26 DIAGNOSIS — Z950 Presence of cardiac pacemaker: Secondary | ICD-10-CM | POA: Diagnosis not present

## 2019-09-26 DIAGNOSIS — I472 Ventricular tachycardia: Secondary | ICD-10-CM | POA: Diagnosis not present

## 2019-09-30 ENCOUNTER — Other Ambulatory Visit: Payer: Self-pay

## 2019-09-30 ENCOUNTER — Ambulatory Visit (INDEPENDENT_AMBULATORY_CARE_PROVIDER_SITE_OTHER): Payer: Medicare Other | Admitting: *Deleted

## 2019-09-30 DIAGNOSIS — G459 Transient cerebral ischemic attack, unspecified: Secondary | ICD-10-CM | POA: Diagnosis not present

## 2019-09-30 DIAGNOSIS — I480 Paroxysmal atrial fibrillation: Secondary | ICD-10-CM | POA: Diagnosis not present

## 2019-09-30 DIAGNOSIS — Z5181 Encounter for therapeutic drug level monitoring: Secondary | ICD-10-CM

## 2019-09-30 LAB — POCT INR: INR: 3.2 — AB (ref 2.0–3.0)

## 2019-09-30 NOTE — Patient Instructions (Signed)
Description   Today take 1/2 tablet then continue taking 1 tablet daily.  Recheck in 4 weeks. Call clinic with any concerns [phone]

## 2019-10-04 DIAGNOSIS — H5201 Hypermetropia, right eye: Secondary | ICD-10-CM | POA: Diagnosis not present

## 2019-10-04 DIAGNOSIS — E113293 Type 2 diabetes mellitus with mild nonproliferative diabetic retinopathy without macular edema, bilateral: Secondary | ICD-10-CM | POA: Diagnosis not present

## 2019-10-04 DIAGNOSIS — H52222 Regular astigmatism, left eye: Secondary | ICD-10-CM | POA: Diagnosis not present

## 2019-10-04 DIAGNOSIS — H524 Presbyopia: Secondary | ICD-10-CM | POA: Diagnosis not present

## 2019-10-04 DIAGNOSIS — H2513 Age-related nuclear cataract, bilateral: Secondary | ICD-10-CM | POA: Diagnosis not present

## 2019-10-04 DIAGNOSIS — H40013 Open angle with borderline findings, low risk, bilateral: Secondary | ICD-10-CM | POA: Diagnosis not present

## 2019-10-04 DIAGNOSIS — H353132 Nonexudative age-related macular degeneration, bilateral, intermediate dry stage: Secondary | ICD-10-CM | POA: Diagnosis not present

## 2019-10-14 DIAGNOSIS — F332 Major depressive disorder, recurrent severe without psychotic features: Secondary | ICD-10-CM | POA: Diagnosis not present

## 2019-10-20 ENCOUNTER — Ambulatory Visit: Payer: Medicare Other | Attending: Internal Medicine

## 2019-10-20 DIAGNOSIS — Z23 Encounter for immunization: Secondary | ICD-10-CM | POA: Insufficient documentation

## 2019-10-20 NOTE — Progress Notes (Signed)
   [id number]  Name:  Lawrence [name].    MRN: [id number] DOB: June 16, 1951  10/20/2019  Mr. Lawrence [name] was observed post Covid-19 immunization for 30 minutes based on pre-vaccination screening without incidence. He was provided with Vaccine Information Sheet and instruction to access the V-Safe system.   Mr. Lawrence [name] was instructed to call 911 with any severe reactions post vaccine: Marland Kitchen Difficulty breathing  . Swelling of your face and throat  . A fast heartbeat  . A bad rash all over your body  . Dizziness and weakness    Immunizations Administered    Name Date Dose VIS Date Route   Pfizer COVID-19 Vaccine 10/20/2019  9:10 AM 0.3 mL 08/16/2019 Intramuscular   Manufacturer: [place]   Lot: [id number]   [place]: [id number]

## 2019-10-28 ENCOUNTER — Ambulatory Visit (INDEPENDENT_AMBULATORY_CARE_PROVIDER_SITE_OTHER): Payer: Medicare Other | Admitting: *Deleted

## 2019-10-28 ENCOUNTER — Other Ambulatory Visit: Payer: Self-pay

## 2019-10-28 DIAGNOSIS — I480 Paroxysmal atrial fibrillation: Secondary | ICD-10-CM

## 2019-10-28 DIAGNOSIS — G459 Transient cerebral ischemic attack, unspecified: Secondary | ICD-10-CM | POA: Diagnosis not present

## 2019-10-28 DIAGNOSIS — E113293 Type 2 diabetes mellitus with mild nonproliferative diabetic retinopathy without macular edema, bilateral: Secondary | ICD-10-CM | POA: Diagnosis not present

## 2019-10-28 DIAGNOSIS — Z5181 Encounter for therapeutic drug level monitoring: Secondary | ICD-10-CM

## 2019-10-28 LAB — POCT INR: INR: 1.6 — AB (ref 2.0–3.0)

## 2019-10-28 NOTE — Patient Instructions (Signed)
Description   Today take 1.5 tablets then continue taking 1 tablet daily.  Recheck in 3 weeks. Call clinic with any concerns [phone]

## 2019-11-08 DIAGNOSIS — Z8679 Personal history of other diseases of the circulatory system: Secondary | ICD-10-CM | POA: Diagnosis not present

## 2019-11-08 DIAGNOSIS — I472 Ventricular tachycardia: Secondary | ICD-10-CM | POA: Diagnosis not present

## 2019-11-08 DIAGNOSIS — Z950 Presence of cardiac pacemaker: Secondary | ICD-10-CM | POA: Diagnosis not present

## 2019-11-08 DIAGNOSIS — Z45018 Encounter for adjustment and management of other part of cardiac pacemaker: Secondary | ICD-10-CM | POA: Diagnosis not present

## 2019-11-11 ENCOUNTER — Ambulatory Visit: Payer: Medicare Other | Attending: Internal Medicine

## 2019-11-11 DIAGNOSIS — Z23 Encounter for immunization: Secondary | ICD-10-CM | POA: Insufficient documentation

## 2019-11-11 NOTE — Progress Notes (Signed)
   [id number] Vaccination [facility]  Name:  Muril Andrzejewski.    MRN: [id number] DOB: 14-Mar-1951  11/11/2019  Mr. Trier was observed post Covid-19 immunization for 15 minutes without incident. He was provided with Vaccine Information Sheet and instruction to access the V-Safe system.   Mr. Apolonio was instructed to call 911 with any severe reactions post vaccine: Marland Kitchen Difficulty breathing  . Swelling of face and throat  . A fast heartbeat  . A bad rash all over body  . Dizziness and weakness   Immunizations Administered    Name Date Dose VIS Date Route   Pfizer COVID-19 Vaccine 11/11/2019  5:58 PM 0.3 mL 08/16/2019 Intramuscular   Manufacturer: [place]   Lot: [id number]   [place]: [id number]

## 2019-11-19 ENCOUNTER — Other Ambulatory Visit: Payer: Self-pay

## 2019-11-19 ENCOUNTER — Ambulatory Visit (INDEPENDENT_AMBULATORY_CARE_PROVIDER_SITE_OTHER): Payer: Medicare Other | Admitting: *Deleted

## 2019-11-19 DIAGNOSIS — Z5181 Encounter for therapeutic drug level monitoring: Secondary | ICD-10-CM | POA: Diagnosis not present

## 2019-11-19 DIAGNOSIS — I480 Paroxysmal atrial fibrillation: Secondary | ICD-10-CM | POA: Diagnosis not present

## 2019-11-19 DIAGNOSIS — G459 Transient cerebral ischemic attack, unspecified: Secondary | ICD-10-CM

## 2019-11-19 LAB — POCT INR: INR: 1.5 — AB (ref 2.0–3.0)

## 2019-11-19 NOTE — Patient Instructions (Signed)
Description   Today take 1.5 tablets then start taking 1 tablet daily except for 1.5 tablets on Wednesdays.  Recheck in 2 weeks. Call clinic with any concerns [phone]

## 2019-12-03 ENCOUNTER — Ambulatory Visit (INDEPENDENT_AMBULATORY_CARE_PROVIDER_SITE_OTHER): Payer: Medicare Other | Admitting: *Deleted

## 2019-12-03 ENCOUNTER — Other Ambulatory Visit: Payer: Self-pay

## 2019-12-03 DIAGNOSIS — G459 Transient cerebral ischemic attack, unspecified: Secondary | ICD-10-CM | POA: Diagnosis not present

## 2019-12-03 DIAGNOSIS — I480 Paroxysmal atrial fibrillation: Secondary | ICD-10-CM

## 2019-12-03 DIAGNOSIS — Z5181 Encounter for therapeutic drug level monitoring: Secondary | ICD-10-CM

## 2019-12-03 LAB — POCT INR: INR: 2.6 (ref 2.0–3.0)

## 2019-12-03 NOTE — Progress Notes (Signed)
Pt will be out of town from 4/17- 4/24

## 2019-12-03 NOTE — Patient Instructions (Signed)
Description    Continue taking 1 tablet daily.  Recheck in 3 weeks. Call clinic with any concerns [phone]

## 2019-12-05 HISTORY — PX: KNEE ARTHROSCOPY: SUR90

## 2019-12-09 DIAGNOSIS — F332 Major depressive disorder, recurrent severe without psychotic features: Secondary | ICD-10-CM | POA: Diagnosis not present

## 2019-12-30 ENCOUNTER — Other Ambulatory Visit: Payer: Self-pay

## 2019-12-30 ENCOUNTER — Ambulatory Visit (INDEPENDENT_AMBULATORY_CARE_PROVIDER_SITE_OTHER): Payer: Medicare Other | Admitting: *Deleted

## 2019-12-30 DIAGNOSIS — G459 Transient cerebral ischemic attack, unspecified: Secondary | ICD-10-CM | POA: Diagnosis not present

## 2019-12-30 DIAGNOSIS — I480 Paroxysmal atrial fibrillation: Secondary | ICD-10-CM | POA: Diagnosis not present

## 2019-12-30 DIAGNOSIS — Z5181 Encounter for therapeutic drug level monitoring: Secondary | ICD-10-CM

## 2019-12-30 LAB — POCT INR: INR: 1.9 — AB (ref 2.0–3.0)

## 2019-12-30 NOTE — Patient Instructions (Signed)
Description    Take 1.5 tablets today and then continue taking 1 tablet daily.  Recheck in 3 weeks. Call clinic with any concerns [phone]

## 2020-01-09 DIAGNOSIS — K219 Gastro-esophageal reflux disease without esophagitis: Secondary | ICD-10-CM | POA: Diagnosis not present

## 2020-01-09 DIAGNOSIS — R05 Cough: Secondary | ICD-10-CM | POA: Diagnosis not present

## 2020-01-10 DIAGNOSIS — G4700 Insomnia, unspecified: Secondary | ICD-10-CM | POA: Diagnosis not present

## 2020-01-10 DIAGNOSIS — F3341 Major depressive disorder, recurrent, in partial remission: Secondary | ICD-10-CM | POA: Diagnosis not present

## 2020-01-10 DIAGNOSIS — I10 Essential (primary) hypertension: Secondary | ICD-10-CM | POA: Diagnosis not present

## 2020-01-10 DIAGNOSIS — F325 Major depressive disorder, single episode, in full remission: Secondary | ICD-10-CM | POA: Diagnosis not present

## 2020-01-10 DIAGNOSIS — G459 Transient cerebral ischemic attack, unspecified: Secondary | ICD-10-CM | POA: Diagnosis not present

## 2020-01-10 DIAGNOSIS — E782 Mixed hyperlipidemia: Secondary | ICD-10-CM | POA: Diagnosis not present

## 2020-01-10 DIAGNOSIS — N400 Enlarged prostate without lower urinary tract symptoms: Secondary | ICD-10-CM | POA: Diagnosis not present

## 2020-01-10 DIAGNOSIS — I4891 Unspecified atrial fibrillation: Secondary | ICD-10-CM | POA: Diagnosis not present

## 2020-01-10 DIAGNOSIS — F331 Major depressive disorder, recurrent, moderate: Secondary | ICD-10-CM | POA: Diagnosis not present

## 2020-01-10 DIAGNOSIS — E1142 Type 2 diabetes mellitus with diabetic polyneuropathy: Secondary | ICD-10-CM | POA: Diagnosis not present

## 2020-01-13 ENCOUNTER — Other Ambulatory Visit: Payer: Self-pay | Admitting: Pharmacist

## 2020-01-13 MED ORDER — WARFARIN SODIUM 5 MG PO TABS: ORAL_TABLET | ORAL | 0 refills | Status: DC

## 2020-01-20 ENCOUNTER — Ambulatory Visit (INDEPENDENT_AMBULATORY_CARE_PROVIDER_SITE_OTHER): Payer: Medicare Other | Admitting: *Deleted

## 2020-01-20 ENCOUNTER — Other Ambulatory Visit: Payer: Self-pay

## 2020-01-20 DIAGNOSIS — Z5181 Encounter for therapeutic drug level monitoring: Secondary | ICD-10-CM

## 2020-01-20 DIAGNOSIS — I480 Paroxysmal atrial fibrillation: Secondary | ICD-10-CM

## 2020-01-20 DIAGNOSIS — G459 Transient cerebral ischemic attack, unspecified: Secondary | ICD-10-CM

## 2020-01-20 LAB — POCT INR: INR: 3 (ref 2.0–3.0)

## 2020-01-20 NOTE — Patient Instructions (Signed)
Description   Continue taking 1 tablet daily.  Recheck in 4 weeks. Call clinic with any concerns [phone] fax number [phone]

## 2020-02-01 ENCOUNTER — Other Ambulatory Visit (HOSPITAL_COMMUNITY): Payer: Self-pay | Admitting: Psychiatry

## 2020-02-01 DIAGNOSIS — F332 Major depressive disorder, recurrent severe without psychotic features: Secondary | ICD-10-CM

## 2020-02-05 DIAGNOSIS — F411 Generalized anxiety disorder: Secondary | ICD-10-CM | POA: Diagnosis not present

## 2020-02-14 ENCOUNTER — Other Ambulatory Visit: Payer: Self-pay

## 2020-02-14 ENCOUNTER — Ambulatory Visit (INDEPENDENT_AMBULATORY_CARE_PROVIDER_SITE_OTHER): Payer: Medicare Other | Admitting: Emergency Medicine

## 2020-02-14 ENCOUNTER — Encounter: Payer: Self-pay | Admitting: Emergency Medicine

## 2020-02-14 ENCOUNTER — Institutional Professional Consult (permissible substitution): Payer: Medicare Other | Admitting: Internal Medicine

## 2020-02-14 DIAGNOSIS — R911 Solitary pulmonary nodule: Secondary | ICD-10-CM | POA: Diagnosis not present

## 2020-02-14 DIAGNOSIS — R053 Chronic cough: Secondary | ICD-10-CM | POA: Insufficient documentation

## 2020-02-14 DIAGNOSIS — R9389 Abnormal findings on diagnostic imaging of other specified body structures: Secondary | ICD-10-CM | POA: Diagnosis not present

## 2020-02-14 DIAGNOSIS — R05 Cough: Secondary | ICD-10-CM | POA: Diagnosis not present

## 2020-02-14 NOTE — Patient Instructions (Signed)
We will perform a CT scan of the chest without contrast to follow your pulmonary nodules, compare to [facility] Please stop Breo temporarily Keep albuterol available to use 2 puffs when you need it for shortness of breath, coughing, chest tightness, wheezing We will perform full pulmonary function testing in next office visit Please continue your pantoprazole and famotidine as you have been taking them. Use your wedge pillow reliably Please add loratadine 10 mg (Claritin) once daily until next visit to help with allergy symptoms Follow with Dr. Lamonte Sakai next available with full pulmonary function testing on the same day.

## 2020-02-14 NOTE — Assessment & Plan Note (Signed)
I suspect that most of his cough is being driven by reflux disease that is anatomical following his esophagectomy, etc.  He does the best he can with this on PPI, H2 blockade and with positional modification, diet modification.  He may be experiencing some intermittent microaspiration.  Not clear that he has asthma but he may be benefiting from the albuterol.  Discussed with him the fact that Breo, powdered formulation inhalers can irritate upper airway disease, contributed cough.  I would like to perform PFT to determine whether he has true airflow obstruction.  If so he may benefit from changing to an alternative ICS/LABA.  Keep the albuterol for now.  Continue aggressive GERD and reflux avoidance therapies.  He has some allergic rhinitis although not clear how much of his cough is being driven by this.  Would be reasonable to trial loratadine.

## 2020-02-14 NOTE — Progress Notes (Signed)
Subjective:    Patient ID: Lawrence [name]., male    DOB: 09/17/1950, 69 y.o.   MRN: [id number]  HPI 69 year old man with a history of esophageal cancer, esophagectomy (2013) with gastric pull-through complicated by anastomotic strictures requiring EGD dilations, associated GERD.  Also with hypertension, A. fib with pacer on anticoagulation, diabetes.  He has pulmonary nodules that have been followed on CT chest, most recently. He is referred today for evaluation of chronic cough. He still deals with a lot of reflux, is on famotidine, pantoprazole. He has felt some evidence for aspiration before, can happen when he doesn't sleep on a wedge pillow. He has some sputum production, clear mucous. He is on Breo and albuterol, was started 1 year ago for the coughing. Hasn't really changed his cough much. He uses albuterol a few times a day. He does seem to benefit - makes the cough better. He does have some exertional SOB. He can hear wheeze at night and w some exertion. He has developed some allergy sx - seasonal.    Coronary CT scan 05/09/2019 reviewed, there is a calcified right middle lobe granuloma.  No other nodule seen.  CT's from [facility] reviewed, 03/06/2018, 09/07/17, 09/08/16, 09/10/15.  He has waxing and waning groundglass infiltrates as well as stable calcified and non-calcified nodular disease   Review of Systems As per HPI  Past Medical History:  Diagnosis Date  . Bradycardia   . GERD (gastroesophageal reflux disease)   . H/O hiatal hernia    s/p  repair 06-26-2012  . History of esophagectomy 08-21-2012  AT [facility]  . History of kidney stones 2006  . History of malignant neoplasm of esophagus ONCOLOGIST-  DR D'AMICO AT [facility]-- PER LAST NOTE 01/ 2018  NO RECURRENCE   dx 10/ 2013  Stage 2A (T2 N0)  08-21-2012 s/p  esophagectomy w/ gastric pull-through [facility]) post residual recurrent anastomotic stricture (multiple EGD w/ dilations)  . History of transient ischemic attack (TIA) 2003   no  residual  . Hypertension   . Long term (current) use of anticoagulants    xarelto  . On dofetilide therapy   . PAF (paroxysmal atrial fibrillation) [facility]) first dx 2009   primary cardiologist-  dr Daneen Schick /  EP cardiologist -- dr Lurene Shadow ([facility])  s/p  afib ablation 08-02-2016  . Pancreatic cyst    x2   . Peyronie's disease   . Presence of permanent cardiac pacemaker implanted 05-01-2014  at [facility] follows  . Pulmonary nodule, right    upper and middle lobes --  per last CT stable  . Type 2 diabetes mellitus treated with insulin ([place])   . Wears glasses      Family History  Problem Relation Age of Onset  . Cancer Father        kidney     Social History   Socioeconomic History  . Marital status: Married    Spouse name: Not on file  . Number of children: Not on file  . Years of education: Not on file  . Highest education level: Not on file  Occupational History  . Not on file  Tobacco Use  . Smoking status: Never Smoker  . Smokeless tobacco: Never Used  Substance and Sexual Activity  . Alcohol use: Yes    Comment: socially  . Drug use: No  . Sexual activity: Not on file  Other Topics Concern  . Not on file  Social History Narrative  .  Not on file   Social Determinants of Health   Financial Resource Strain:   . Difficulty of Paying Living Expenses:   Food Insecurity:   . Worried About Charity fundraiser in the Last Year:   . Arboriculturist in the Last Year:   Transportation Needs:   . Film/video editor (Medical):   Marland Kitchen Lack of Transportation (Non-Medical):   Physical Activity:   . Days of Exercise per Week:   . Minutes of Exercise per Session:   Stress:   . Feeling of Stress :   Social Connections:   . Frequency of Communication with Friends and Family:   . Frequency of Social Gatherings with Friends and Family:   . Attends Religious Services:   . Active Member of Clubs or Organizations:   . Attends Theatre manager Meetings:   Marland Kitchen Marital Status:   Intimate Partner Violence:   . Fear of Current or Ex-Partner:   . Emotionally Abused:   Marland Kitchen Physically Abused:   . Sexually Abused:      No Known Allergies   Outpatient Medications Prior to Visit  Medication Sig Dispense Refill  . albuterol (VENTOLIN HFA) 108 (90 Base) MCG/ACT inhaler once daily as needed    . amLODipine (NORVASC) 10 MG tablet Take 10 mg by mouth daily.    Marland Kitchen atorvastatin (LIPITOR) 20 MG tablet Take 20 mg by mouth daily.    Marland Kitchen BREO ELLIPTA 200-25 MCG/INH AEPB 1 puff daily.    . busPIRone (BUSPAR) 15 MG tablet Take 15 mg by mouth 2 (two) times daily.    . famotidine (PEPCID) 20 MG tablet Take 20 mg by mouth as needed.    Marland Kitchen FLUoxetine (PROZAC) 20 MG capsule Take 60 mg by mouth daily.    Marland Kitchen FLUoxetine HCl 60 MG TABS Take 1 capsule by mouth daily.    . hydrochlorothiazide (MICROZIDE) 12.5 MG capsule Take 12.5 mg by mouth daily.    . insulin NPH Human (NOVOLIN N) 100 UNIT/ML injection 15 Units 2 (two) times daily    . isosorbide mononitrate (IMDUR) 30 MG 24 hr tablet Take by mouth.    Marland Kitchen LORazepam (ATIVAN) 1 MG tablet Take 1 mg by mouth 2 (two) times daily.     . magnesium oxide (MAG-OX) 400 MG tablet Take 400 mg by mouth daily.    . magnesium oxide (MAG-OX) 400 MG tablet Take by mouth.    . metoprolol tartrate (LOPRESSOR) 25 MG tablet TAKE 1 TABLET BY MOUTH TWICE A DAY 180 tablet 2  . NOVOLIN N RELION 100 UNIT/ML injection 15 Units BID    . pantoprazole (PROTONIX) 40 MG tablet Take 40 mg by mouth daily.    Marland Kitchen telmisartan (MICARDIS) 80 MG tablet Take 80 mg by mouth daily.    . temazepam (RESTORIL) 15 MG capsule Take 15 mg by mouth at bedtime as needed.    . traMADol (ULTRAM) 50 MG tablet Take 50 mg by mouth 2 (two) times daily as needed for moderate pain.    . vitamin B-12 (CYANOCOBALAMIN) 1000 MCG tablet Take 1,000 mcg by mouth every other day. Taking Mon, Wed, Fri    . warfarin (COUMADIN) 5 MG tablet Take one tablet by mouth daily  or as directed by coumadin clinic 100 tablet 0  . warfarin (COUMADIN) 5 MG tablet Take by mouth.    . isosorbide mononitrate (IMDUR) 30 MG 24 hr tablet Take 1 tablet (30 mg total) by mouth daily. 90 tablet 3  No facility-administered medications prior to visit.         Objective:   Physical Exam Vitals:   02/14/20 0924  BP: 130/72  Pulse: 68  Temp: 98.2 F (36.8 C)  TempSrc: Oral  SpO2: 97%  Weight: 247 lb 6.4 oz (112.2 kg)  Height: 6\' 1"  (1.854 m)   Gen: Pleasant, well-nourished, in no distress,  normal affect  ENT: No lesions,  mouth clear,  oropharynx clear, no postnasal drip  Neck: No JVD, no stridor  Lungs: No use of accessory muscles, some focal Rhonchi on the R on insp and exp, L is clear, no wheezing  Cardiovascular: RRR, heart sounds normal, no murmur or gallops, no peripheral edema  Musculoskeletal: No deformities, no cyanosis or clubbing  Neuro: alert, awake, non focal  Skin: Warm, no lesions or rash      Assessment & Plan:  Chronic cough I suspect that most of his cough is being driven by reflux disease that is anatomical following his esophagectomy, etc.  He does the best he can with this on PPI, H2 blockade and with positional modification, diet modification.  He may be experiencing some intermittent microaspiration.  Not clear that he has asthma but he may be benefiting from the albuterol.  Discussed with him the fact that Breo, powdered formulation inhalers can irritate upper airway disease, contributed cough.  I would like to perform PFT to determine whether he has true airflow obstruction.  If so he may benefit from changing to an alternative ICS/LABA.  Keep the albuterol for now.  Continue aggressive GERD and reflux avoidance therapies.  He has some allergic rhinitis although not clear how much of his cough is being driven by this.  Would be reasonable to trial loratadine.  Abnormal CT of the chest He has had waxing and waning groundglass  infiltrates/nodules on prior CT scans from [facility] that were followed, last July 2019.  Also stable pulmonary nodular disease some calcified.  Question whether he is having intermittent microaspiration leading to the groundglass.  None of the groundglass findings have proven to persist or grow.  He needs a repeat CT scan of the chest now to follow for interval stability or change.  Based on the results we will decide if he needs serial follow-up, need to consider other diagnostics like bronchoscopy.  Baltazar Apo, MD, PhD 02/14/2020, 10:15 AM [facility] [phone] or if no answer [phone]

## 2020-02-14 NOTE — Assessment & Plan Note (Signed)
He has had waxing and waning groundglass infiltrates/nodules on prior CT scans from [facility] that were followed, last July 2019.  Also stable pulmonary nodular disease some calcified.  Question whether he is having intermittent microaspiration leading to the groundglass.  None of the groundglass findings have proven to persist or grow.  He needs a repeat CT scan of the chest now to follow for interval stability or change.  Based on the results we will decide if he needs serial follow-up, need to consider other diagnostics like bronchoscopy.

## 2020-02-17 ENCOUNTER — Ambulatory Visit (INDEPENDENT_AMBULATORY_CARE_PROVIDER_SITE_OTHER): Payer: Medicare Other | Admitting: Pharmacist

## 2020-02-17 ENCOUNTER — Other Ambulatory Visit: Payer: Self-pay

## 2020-02-17 DIAGNOSIS — Z5181 Encounter for therapeutic drug level monitoring: Secondary | ICD-10-CM

## 2020-02-17 DIAGNOSIS — G459 Transient cerebral ischemic attack, unspecified: Secondary | ICD-10-CM | POA: Diagnosis not present

## 2020-02-17 DIAGNOSIS — I480 Paroxysmal atrial fibrillation: Secondary | ICD-10-CM

## 2020-02-17 LAB — POCT INR: INR: 2.4 (ref 2.0–3.0)

## 2020-02-17 NOTE — Patient Instructions (Addendum)
Description   Continue taking 1 tablet daily.  Recheck in 6 weeks. Call clinic with any concerns [phone] fax number [phone]

## 2020-02-18 ENCOUNTER — Telehealth: Payer: Self-pay | Admitting: Emergency Medicine

## 2020-02-18 NOTE — Telephone Encounter (Signed)
Spoke with pt in the lobby. Pt is wanting to make sure his COVID vaccine information is in his chart for his PFT appointment. Pt's vaccine dates were already in his chart but I have verified them with his vaccine card. Dates have been confirmed in the pt's chart.

## 2020-03-02 ENCOUNTER — Ambulatory Visit
Admission: RE | Admit: 2020-03-02 | Discharge: 2020-03-02 | Disposition: A | Discharge status: Home / Self Care | Payer: Medicare Other | Source: Ambulatory Visit | Attending: Emergency Medicine | Admitting: Emergency Medicine

## 2020-03-02 DIAGNOSIS — R911 Solitary pulmonary nodule: Secondary | ICD-10-CM

## 2020-03-23 DIAGNOSIS — F325 Major depressive disorder, single episode, in full remission: Secondary | ICD-10-CM | POA: Diagnosis not present

## 2020-03-23 DIAGNOSIS — K219 Gastro-esophageal reflux disease without esophagitis: Secondary | ICD-10-CM | POA: Diagnosis not present

## 2020-03-23 DIAGNOSIS — R05 Cough: Secondary | ICD-10-CM | POA: Diagnosis not present

## 2020-03-23 DIAGNOSIS — Z1389 Encounter for screening for other disorder: Secondary | ICD-10-CM | POA: Diagnosis not present

## 2020-03-23 DIAGNOSIS — I10 Essential (primary) hypertension: Secondary | ICD-10-CM | POA: Diagnosis not present

## 2020-03-23 DIAGNOSIS — E782 Mixed hyperlipidemia: Secondary | ICD-10-CM | POA: Diagnosis not present

## 2020-03-23 DIAGNOSIS — E1142 Type 2 diabetes mellitus with diabetic polyneuropathy: Secondary | ICD-10-CM | POA: Diagnosis not present

## 2020-03-23 DIAGNOSIS — F411 Generalized anxiety disorder: Secondary | ICD-10-CM | POA: Diagnosis not present

## 2020-03-23 DIAGNOSIS — E113291 Type 2 diabetes mellitus with mild nonproliferative diabetic retinopathy without macular edema, right eye: Secondary | ICD-10-CM | POA: Diagnosis not present

## 2020-03-23 DIAGNOSIS — Z0000 Encounter for general adult medical examination without abnormal findings: Secondary | ICD-10-CM | POA: Diagnosis not present

## 2020-03-30 DIAGNOSIS — F332 Major depressive disorder, recurrent severe without psychotic features: Secondary | ICD-10-CM | POA: Diagnosis not present

## 2020-04-01 ENCOUNTER — Encounter: Payer: Self-pay | Admitting: Emergency Medicine

## 2020-04-01 ENCOUNTER — Ambulatory Visit (INDEPENDENT_AMBULATORY_CARE_PROVIDER_SITE_OTHER): Payer: Medicare Other | Admitting: Emergency Medicine

## 2020-04-01 ENCOUNTER — Other Ambulatory Visit: Payer: Self-pay

## 2020-04-01 DIAGNOSIS — R9389 Abnormal findings on diagnostic imaging of other specified body structures: Secondary | ICD-10-CM

## 2020-04-01 DIAGNOSIS — R053 Chronic cough: Secondary | ICD-10-CM

## 2020-04-01 DIAGNOSIS — R05 Cough: Secondary | ICD-10-CM

## 2020-04-01 DIAGNOSIS — R911 Solitary pulmonary nodule: Secondary | ICD-10-CM

## 2020-04-01 LAB — PULMONARY FUNCTION TEST
DL/VA % pred: 106 %
DL/VA: 4.28 ml/min/mmHg/L
DLCO cor % pred: 71 %
DLCO cor: 20.48 ml/min/mmHg
DLCO unc % pred: 71 %
DLCO unc: 20.48 ml/min/mmHg
FEF 25-75 Post: 2.53 L/sec
FEF 25-75 Pre: 2.27 L/sec
FEF2575-%Change-Post: 11 %
FEF2575-%Pred-Post: 88 %
FEF2575-%Pred-Pre: 79 %
FEV1-%Change-Post: 2 %
FEV1-%Pred-Post: 70 %
FEV1-%Pred-Pre: 68 %
FEV1-Post: 2.61 L
FEV1-Pre: 2.56 L
FEV1FVC-%Change-Post: 0 %
FEV1FVC-%Pred-Pre: 106 %
FEV6-%Change-Post: 1 %
FEV6-%Pred-Post: 69 %
FEV6-%Pred-Pre: 68 %
FEV6-Post: 3.3 L
FEV6-Pre: 3.24 L
FEV6FVC-%Pred-Post: 105 %
FEV6FVC-%Pred-Pre: 105 %
FVC-%Change-Post: 1 %
FVC-%Pred-Post: 65 %
FVC-%Pred-Pre: 64 %
FVC-Post: 3.3 L
FVC-Pre: 3.24 L
Post FEV1/FVC ratio: 79 %
Post FEV6/FVC ratio: 100 %
Pre FEV1/FVC ratio: 79 %
Pre FEV6/FVC Ratio: 100 %
RV % pred: 102 %
RV: 2.64 L
TLC % pred: 75 %
TLC: 5.79 L

## 2020-04-01 NOTE — Assessment & Plan Note (Signed)
Stable granulomatous disease, calcified nodular disease without any groundglass infiltrates.  Based on this I do not think we need to schedule repeat scanning at this time.  We can follow clinically.

## 2020-04-01 NOTE — Progress Notes (Signed)
Subjective:    Patient ID: Lawrence Fudge., male    DOB: 1951/01/26, 69 y.o.   MRN: [id number]  HPI 69 year old man with a history of esophageal cancer, esophagectomy (2013) with gastric pull-through complicated by anastomotic strictures requiring EGD dilations, associated GERD.  Also with hypertension, A. fib with pacer on anticoagulation, diabetes.  He has pulmonary nodules that have been followed on CT chest, most recently. He is referred today for evaluation of chronic cough. He still deals with a lot of reflux, is on famotidine, pantoprazole. He has felt some evidence for aspiration before, can happen when he doesn't sleep on a wedge pillow. He has some sputum production, clear mucous. He is on Breo and albuterol, was started 1 year ago for the coughing. Hasn't really changed his cough much. He uses albuterol a few times a day. He does seem to benefit - makes the cough better. He does have some exertional SOB. He can hear wheeze at night and w some exertion. He has developed some allergy sx - seasonal.   Coronary CT scan 05/09/2019 reviewed, there is a calcified right middle lobe granuloma.  No other nodule seen.  CT's from [facility] reviewed, 03/06/2018, 09/07/17, 09/08/16, 09/10/15.  He has waxing and waning groundglass infiltrates as well as stable calcified and non-calcified nodular disease  ROV 04/01/20 --this follow-up visit for 69 year old gentleman with a history of esophageal cancer and surgeries, associated GERD, hypertension, atrial fibrillation (pacer), diabetes.  I saw him last month for chronic cough, felt to be significantly impacted by GERD.  Start Breo temporarily, continue pantoprazole and famotidine, wedge pillow Also has waxing and waning groundglass infiltrates on prior CT scans of the chest, some calcified nodular disease. Still with sporadic episodic cough, affects his breathing as well. Better w albuterol - uses 2-3x a day when the cough is active. The flares are happening a bit less  frequently.   Pulmonary function testing done today, reviewed by me, shows changes consistent with restriction, possible superimposed obstruction, no bronchodilator response, restrictive lung volumes, decreased diffusion capacity that corrects to normal range when adjusted for alveolar volume.  CT scan of the chest 03/02/2020 reviewed by me, shows stable right upper lobe pulmonary nodule with some calcification, no significant change compared with 05/2019, remote PET scan 2013. No GGI's  MDM: Reviewed CT chest and PFT    Review of Systems As per HPI  Past Medical History:  Diagnosis Date  . Bradycardia   . GERD (gastroesophageal reflux disease)   . H/O hiatal hernia    s/p  repair 06-26-2012  . History of esophagectomy 08-21-2012  AT [facility]  . History of kidney stones 2006  . History of malignant neoplasm of esophagus ONCOLOGIST-  DR D'AMICO AT [facility]-- PER LAST NOTE 01/ 2018  NO RECURRENCE   dx 10/ 2013  Stage 2A (T2 N0)  08-21-2012 s/p  esophagectomy w/ gastric pull-through [facility]) post residual recurrent anastomotic stricture (multiple EGD w/ dilations)  . History of transient ischemic attack (TIA) 2003   no residual  . Hypertension   . Long term (current) use of anticoagulants    xarelto  . On dofetilide therapy   . PAF (paroxysmal atrial fibrillation) [facility]) first dx 2009   primary cardiologist-  dr Daneen Schick /  EP cardiologist -- dr Lurene Shadow ([facility])  s/p  afib ablation 08-02-2016  . Pancreatic cyst    x2   . Peyronie's disease   . Presence of permanent cardiac pacemaker implanted 05-01-2014  at [facility]  Dr.Daubert [facility] follows  . Pulmonary nodule, right    upper and middle lobes --  per last CT stable  . Type 2 diabetes mellitus treated with insulin ([place])   . Wears glasses      Family History  Problem Relation Age of Onset  . Cancer Father        kidney     Social History   Socioeconomic History  . Marital status: Married    Spouse name: Not on file   . Number of children: Not on file  . Years of education: Not on file  . Highest education level: Not on file  Occupational History  . Not on file  Tobacco Use  . Smoking status: Never Smoker  . Smokeless tobacco: Never Used  Substance and Sexual Activity  . Alcohol use: Yes    Comment: socially  . Drug use: No  . Sexual activity: Not on file  Other Topics Concern  . Not on file  Social History Narrative  . Not on file   Social Determinants of Health   Financial Resource Strain:   . Difficulty of Paying Living Expenses:   Food Insecurity:   . Worried About Charity fundraiser in the Last Year:   . Arboriculturist in the Last Year:   Transportation Needs:   . Film/video editor (Medical):   Marland Kitchen Lack of Transportation (Non-Medical):   Physical Activity:   . Days of Exercise per Week:   . Minutes of Exercise per Session:   Stress:   . Feeling of Stress :   Social Connections:   . Frequency of Communication with Friends and Family:   . Frequency of Social Gatherings with Friends and Family:   . Attends Religious Services:   . Active Member of Clubs or Organizations:   . Attends Archivist Meetings:   Marland Kitchen Marital Status:   Intimate Partner Violence:   . Fear of Current or Ex-Partner:   . Emotionally Abused:   Marland Kitchen Physically Abused:   . Sexually Abused:      No Known Allergies   Outpatient Medications Prior to Visit  Medication Sig Dispense Refill  . albuterol (VENTOLIN HFA) 108 (90 Base) MCG/ACT inhaler once daily as needed    . amLODipine (NORVASC) 10 MG tablet Take 10 mg by mouth daily.    Marland Kitchen atorvastatin (LIPITOR) 20 MG tablet Take 20 mg by mouth daily.    . busPIRone (BUSPAR) 15 MG tablet Take 15 mg by mouth 2 (two) times daily.    . famotidine (PEPCID) 20 MG tablet Take 20 mg by mouth as needed.    Marland Kitchen FLUoxetine HCl 60 MG TABS Take 1 capsule by mouth daily.    . hydrochlorothiazide (MICROZIDE) 12.5 MG capsule Take 12.5 mg by mouth daily.    . insulin  NPH Human (NOVOLIN N) 100 UNIT/ML injection 15 Units 2 (two) times daily    . isosorbide mononitrate (IMDUR) 30 MG 24 hr tablet Take by mouth.    Marland Kitchen LORazepam (ATIVAN) 1 MG tablet Take 1 mg by mouth 2 (two) times daily.     . magnesium oxide (MAG-OX) 400 MG tablet Take 400 mg by mouth daily.    . magnesium oxide (MAG-OX) 400 MG tablet Take by mouth.    . metoprolol tartrate (LOPRESSOR) 25 MG tablet TAKE 1 TABLET BY MOUTH TWICE A DAY 180 tablet 2  . NOVOLIN N RELION 100 UNIT/ML injection 14 Units 2 (two) times daily  before a meal. 15 Units BID    . pantoprazole (PROTONIX) 40 MG tablet Take 40 mg by mouth daily.    Marland Kitchen telmisartan (MICARDIS) 80 MG tablet Take 80 mg by mouth daily.    . traMADol (ULTRAM) 50 MG tablet Take 50 mg by mouth 2 (two) times daily as needed for moderate pain.    . vitamin B-12 (CYANOCOBALAMIN) 1000 MCG tablet Take 1,000 mcg by mouth every other day. Taking Mon, Wed, Fri    . warfarin (COUMADIN) 5 MG tablet Take one tablet by mouth daily or as directed by coumadin clinic 100 tablet 0  . warfarin (COUMADIN) 5 MG tablet Take by mouth.    Marland Kitchen BREO ELLIPTA 200-25 MCG/INH AEPB 1 puff daily. (Patient not taking: Reported on 04/01/2020)    . FLUoxetine (PROZAC) 20 MG capsule Take 60 mg by mouth daily. (Patient not taking: Reported on 04/01/2020)    . isosorbide mononitrate (IMDUR) 30 MG 24 hr tablet Take 1 tablet (30 mg total) by mouth daily. 90 tablet 3   No facility-administered medications prior to visit.         Objective:   Physical Exam Vitals:   04/01/20 0953  BP: (!) 132/68  Pulse: 65  Temp: 98.3 F (36.8 C)  TempSrc: Oral  SpO2: 98%  Weight: (!) 243 lb (110.2 kg)  Height: 6\' 1"  (1.854 m)   Gen: Pleasant, well-nourished, in no distress,  normal affect  ENT: No lesions,  mouth clear,  oropharynx clear, no postnasal drip  Neck: No JVD, no stridor  Lungs: No use of accessory muscles, some focal Rhonchi on the R on insp and exp, L is clear, no  wheezing  Cardiovascular: RRR, heart sounds normal, no murmur or gallops, no peripheral edema  Musculoskeletal: No deformities, no cyanosis or clubbing  Neuro: alert, awake, non focal  Skin: Warm, no lesions or rash      Assessment & Plan:  Abnormal CT of the chest Stable granulomatous disease, calcified nodular disease without any groundglass infiltrates.  Based on this I do not think we need to schedule repeat scanning at this time.  We can follow clinically.  Chronic cough Pulmonary function testing with mixed restriction, possible mild obstruction.  I believe that silent reflux is still impacting his cough the most.  Note that he also may have allergic disease during the fall and spring.  He benefits from albuterol and his pulmonary function testing does show mild obstruction.  I do not think we should restart a scheduled ICS/LABA at this time but we could consider depending on the frequency of his flaring going forward.  If we do so we will choose a nonpowdered formulation.  He may need an allergy regimen.  I will follow with him in October to assess the frequency of his symptoms.   Please continue your Pepcid and Protonix as you have been taking them.  Continue to use your wedge pillow reliably and be careful with the timing of your meals. Keep albuterol available to use 2 puffs when you need it for shortness of breath, coughing, chest tightness. We will hold off on starting an every day maintenance inhaler for now.  We may decide to reconsider depending on how frequently you have symptoms going forward. Follow-up in October or sooner if you have any problems  Baltazar Apo, MD, PhD 04/01/2020, 10:26 AM [facility] [phone] or if no answer [phone]

## 2020-04-01 NOTE — Assessment & Plan Note (Signed)
Pulmonary function testing with mixed restriction, possible mild obstruction.  I believe that silent reflux is still impacting his cough the most.  Note that he also may have allergic disease during the fall and spring.  He benefits from albuterol and his pulmonary function testing does show mild obstruction.  I do not think we should restart a scheduled ICS/LABA at this time but we could consider depending on the frequency of his flaring going forward.  If we do so we will choose a nonpowdered formulation.  He may need an allergy regimen.  I will follow with him in October to assess the frequency of his symptoms.   Please continue your Pepcid and Protonix as you have been taking them.  Continue to use your wedge pillow reliably and be careful with the timing of your meals. Keep albuterol available to use 2 puffs when you need it for shortness of breath, coughing, chest tightness. We will hold off on starting an every day maintenance inhaler for now.  We may decide to reconsider depending on how frequently you have symptoms going forward. Follow-up in October or sooner if you have any problems

## 2020-04-01 NOTE — Progress Notes (Signed)
Full PFT performed today. °

## 2020-04-01 NOTE — Patient Instructions (Addendum)
Your CT scan of the chest is stable compared with priors.  We should not need to repeat this unless there is a clinical change Please continue your Pepcid and Protonix as you have been taking them.  Continue to use your wedge pillow reliably and be careful with the timing of your meals. Keep albuterol available to use 2 puffs when you need it for shortness of breath, coughing, chest tightness. We will hold off on starting an every day maintenance inhaler for now.  We may decide to reconsider depending on how frequently you have symptoms going forward. Follow-up in October or sooner if you have any problems.

## 2020-04-02 NOTE — Progress Notes (Signed)
Cardiology Office Note:    Date:  04/03/2020   ID:  Lawrence Fudge., DOB July 17, 1951, MRN [id number]  PCP:  [name], MD  Cardiologist:  [name], MD   Referring MD: [name], MD   Chief Complaint  Patient presents with  . Atrial Fibrillation  . Advice Only    Anticoagulation    History of Present Illness:    Lawrence Thal. is a 69 y.o. male with a hx of atrial fibrillation (Dr.Daubert at [facility]) , dofetilide therapy, chronic anticoagulation, hypertension, TIA, and hyperlipidemia. Prior history of esophagectomy for esophageal cancer 2013.He had atrial fibrillation at [facility] in 2018 and has had no subsequent atrial fib. Dofetilide has been discontinued since November 2018.  Lawrence [name] is doing well. He volunteers 3 times per week at the [facility] where he gets 2-1/2 to 3 hours of walking and activity with pushing patients and walking throughout the facility. He has not had chest pain. He still has some dyspnea on exertion. Dyspnea is associated with wheezing. He has seen Dr. Lamonte Sakai from pulmonary standpoint. He denies edema. He has not had chest discomfort.  Past Medical History:  Diagnosis Date  . Bradycardia   . GERD (gastroesophageal reflux disease)   . H/O hiatal hernia    s/p  repair 06-26-2012  . History of esophagectomy 08-21-2012  AT [facility]  . History of kidney stones 2006  . History of malignant neoplasm of esophagus ONCOLOGIST-  DR D'AMICO AT [facility]-- PER LAST NOTE 01/ 2018  NO RECURRENCE   dx 10/ 2013  Stage 2A (T2 N0)  08-21-2012 s/p  esophagectomy w/ gastric pull-through [facility]) post residual recurrent anastomotic stricture (multiple EGD w/ dilations)  . History of transient ischemic attack (TIA) 2003   no residual  . Hypertension   . Long term (current) use of anticoagulants    xarelto  . On dofetilide therapy   . PAF (paroxysmal atrial fibrillation) [facility]) first dx 2009   primary  cardiologist-  dr Daneen Schick /  EP cardiologist -- dr Lurene Shadow ([facility])  s/p  afib ablation 08-02-2016  . Pancreatic cyst    x2   . Peyronie's disease   . Presence of permanent cardiac pacemaker implanted 05-01-2014  at [facility] follows  . Pulmonary nodule, right    upper and middle lobes --  per last CT stable  . Type 2 diabetes mellitus treated with insulin ([place])   . Wears glasses     Past Surgical History:  Procedure Laterality Date  . ANTERIOR CERVICAL DECOMP/DISCECTOMY FUSION N/A 06/02/2016   Procedure: ANTERIOR CERVICAL DECOMPRESSION FUSION CERVICAL 5-6, CERVICAL 6-7 WITH INSTRUMENTATION AND ALLOGRAFT;  Surgeon: Phylliss Bob, MD;  Location: [facility];  Service: Orthopedics;  Laterality: N/A;  ANTERIOR CERVICAL DECOMPRESSION FUSION CERVICAL 5-6, CERVICAL 6-7 WITH INSTRUMENTATION AND ALLOGRAFT  . BIOPSY  06/26/2012   Procedure: BIOPSY;  Surgeon: Madilyn Hook, DO;  Location: [facility];  Service: General;;  . CARDIAC ELECTROPHYSIOLOGY STUDY AND ABLATION  08-02-2016   [facility]   (dr Lurene Shadow)   pulmonary veins isolation and ablation afib  . CARDIAC PACEMAKER PLACEMENT  05-01-2014   [facility]  . CHOLECYSTECTOMY  1988  . COLONOSCOPY WITH PROPOFOL N/A 07/13/2015   Procedure: COLONOSCOPY WITH PROPOFOL;  Surgeon: Garlan Fair, MD;  Location[facility];  Service: Endoscopy;  Laterality: N/A;  . ESOPHAGECTOMY  08-21-2012   [facility]   w/ gastric pull-through  .  ESOPHAGOGASTRODUODENOSCOPY (EGD) WITH ESOPHAGEAL DILATION  multiple x32 per pt approx.--- last one 02-09-2015   recurrent anastomotic stricture post esophagectomy  . HIATAL HERNIA REPAIR  06/26/2012   Procedure: LAPAROSCOPIC REPAIR OF HIATAL HERNIA;  Surgeon: Madilyn Hook, DO;  Location[facility];  Service: General;;  . Fatima Blank HERNIA REPAIR  06/26/2012   Procedure: HERNIA REPAIR INCISIONAL;  Surgeon: Madilyn Hook, DO;  Location[facility];  Service: General;;  . INGUINAL HERNIA REPAIR Bilateral 1987  . JEJUNOSTOMY FEEDING  TUBE  12/22/2012   Removed 12/ 2016  approx.  Marland Kitchen KNEE ARTHROSCOPY Left 1990  . MASS EXCISION Left 04/04/2013   Procedure: EXCISION OF SCALP MASS;  Surgeon: Madilyn Hook, DO;  [facility];  Service: General;  Laterality: Left;   pilomatrixoma  . NESBIT PROCEDURE N/A 12/09/2016   Procedure: NESBIT PROCEDURE 16 DOT PLICATION;  Surgeon: Kathie Rhodes, MD;  Location: [facility];  Service: Urology;  Laterality: N/A;  . TONSILLECTOMY  1960  . TRANSTHORACIC ECHOCARDIOGRAM  08-06-2015   [facility]   mild LVH, ef 50%/  mild RAE /  moderate LAE/  trivial AR, MR, PR,  and TR    Current Medications: Current Meds  Medication Sig  . albuterol (VENTOLIN HFA) 108 (90 Base) MCG/ACT inhaler once daily as needed  . amLODipine (NORVASC) 10 MG tablet Take 10 mg by mouth daily.  Marland Kitchen atorvastatin (LIPITOR) 20 MG tablet Take 20 mg by mouth daily.  . busPIRone (BUSPAR) 15 MG tablet Take 15 mg by mouth 2 (two) times daily.  . famotidine (PEPCID) 20 MG tablet Take 20 mg by mouth as needed.  Marland Kitchen FLUoxetine HCl 60 MG TABS Take 1 capsule by mouth daily.  . hydrochlorothiazide (MICROZIDE) 12.5 MG capsule Take 12.5 mg by mouth daily.  . insulin NPH Human (NOVOLIN N) 100 UNIT/ML injection 15 Units 2 (two) times daily  . isosorbide mononitrate (IMDUR) 30 MG 24 hr tablet Take 1 tablet (30 mg total) by mouth daily.  Marland Kitchen LORazepam (ATIVAN) 1 MG tablet Take 1 mg by mouth 2 (two) times daily.   . magnesium oxide (MAG-OX) 400 MG tablet Take 400 mg by mouth daily.  . metoprolol tartrate (LOPRESSOR) 25 MG tablet TAKE 1 TABLET BY MOUTH TWICE A DAY  . pantoprazole (PROTONIX) 40 MG tablet Take 40 mg by mouth daily.  Marland Kitchen telmisartan (MICARDIS) 80 MG tablet Take 80 mg by mouth daily.  . traMADol (ULTRAM) 50 MG tablet Take 50 mg by mouth 2 (two) times daily as needed for moderate pain.  . vitamin B-12 (CYANOCOBALAMIN) 1000 MCG tablet Take 1,000 mcg by mouth every other day. Taking Mon, Wed, Fri  . warfarin (COUMADIN) 5 MG tablet  Take one tablet by mouth daily or as directed by coumadin clinic  . warfarin (COUMADIN) 5 MG tablet Take by mouth.     Allergies:   Patient has no known allergies.   Social History   Socioeconomic History  . Marital status: Married    Spouse name: Not on file  . Number of children: Not on file  . Years of education: Not on file  . Highest education level: Not on file  Occupational History  . Not on file  Tobacco Use  . Smoking status: Never Smoker  . Smokeless tobacco: Never Used  Substance and Sexual Activity  . Alcohol use: Yes    Comment: socially  . Drug use: No  . Sexual activity: Not on file  Other Topics Concern  . Not on file  Social History Narrative  . Not on file   Social Determinants of Health   Financial Resource Strain:   . Difficulty of Paying Living Expenses:   Food Insecurity:   . Worried About Charity fundraiser in the Last Year:   . Arboriculturist in the Last Year:   Transportation Needs:   . Film/video editor (Medical):   Marland Kitchen Lack of Transportation (Non-Medical):   Physical Activity:   . Days of Exercise per Week:   . Minutes of Exercise per Session:   Stress:   . Feeling of Stress :   Social Connections:   . Frequency of Communication with Friends and Family:   . Frequency of Social Gatherings with Friends and Family:   . Attends Religious Services:   . Active Member of Clubs or Organizations:   . Attends Archivist Meetings:   Marland Kitchen Marital Status:      Family History: The patient's family history includes Cancer in his father.  ROS:   Please see the history of present illness.    Apnea with occasional wheezing. No chest discomfort all other systems reviewed and are negative.  EKGs/Labs/Other Studies Reviewed:    The following studies were reviewed today:  Coronary CT angiogram September 2020: IMPRESSION: 1. Multivessel non-obstructive CAD, with mild non-obstructive mixed plaque of the the LAD, RCA and OM1 and  moderate mixed stenosis of a small Ramus branch, not amenable to intervention. CADRADS 2/3.  2. Coronary calcium score of 357. This was 73rd percentile for age and sex matched control.  3. Normal coronary origin with right dominance.  4. Trileaflet aortic valve with calcification of the right coronary cusp and annulus.  5. Dilated main PA measuring 32 mm.  6. Dual chamber pacemaker noted with lead tips in the expected position   EKG:  EKG atrial paced rhythm, otherwise normal.  Recent Labs: 04/19/2019: NT-Pro BNP 291 05/14/2019: BUN 19; Creatinine, Ser 1.29; Potassium 4.9; Sodium 135  Recent Lipid Panel    Component Value Date/Time   CHOL 157 04/13/2012 0825   TRIG 103 04/13/2012 0825   HDL 40 04/13/2012 0825   CHOLHDL 3.9 04/13/2012 0825   VLDL 21 04/13/2012 0825   LDLCALC 96 04/13/2012 0825    Physical Exam:    VS:  BP (!) 96/64   Pulse 64   Ht 6\' 1"  (1.854 m)   Wt (!) 243 lb 8 oz (110.5 kg)   SpO2 96%   BMI 32.13 kg/m     Wt Readings from Last 3 Encounters:  04/03/20 (!) 243 lb 8 oz (110.5 kg)  04/01/20 (!) 243 lb (110.2 kg)  02/14/20 247 lb 6.4 oz (112.2 kg)     GEN: Age in appearance. No acute distress HEENT: Normal NECK: No JVD. LYMPHATICS: No lymphadenopathy CARDIAC:  RRR without murmur, gallop, or edema. VASCULAR:  Normal Pulses. No bruits. RESPIRATORY:  Clear to auscultation without rales, wheezing or rhonchi  ABDOMEN: Soft, non-tender, non-distended, No pulsatile mass, MUSCULOSKELETAL: No deformity  SKIN: Warm and dry NEUROLOGIC:  Alert and oriented x 3 PSYCHIATRIC:  Normal affect   ASSESSMENT:    1. Paroxysmal atrial fibrillation (HCC)   2. Hypertension, benign   3. Long term (current) use of anticoagulants   4. S/P ablation of atrial fibrillation   5. TIA (transient ischemic attack)   6. Hyperlipidemia, unspecified hyperlipidemia type   7. DDD Pacemaker   8. Educated about COVID-19 virus infection    PLAN:    In  order of  problems listed above:  1. Controlled after ablation. Currently in a paced rhythm. Not currently on antiarrhythmic therapy. 2. Blood pressure is relatively low today. Will need to consider whether or not Lopressor, Imdur, Microzide, Norvasc, and myocarditis all need to be continued. States home blood pressures are excellent. He is not lightheaded or dizzy. I repeated the blood pressure obtained and cath 120/72 mmHg. 3. Anticoagulation. Watch for bleeding. 4. No recurrence of atrial fibrillation. 5. No new neurological complaints. 6. Continue high intensity statin therapy. 7. Pacer function appears normal. 8. He has been vaccinated and is actively practicing social distancing and masking given the delta variant.  He should notify me of chest pain, lightheadedness, low blood pressures, or any other cardiac complaints. Otherwise plan see him back in 1 year.   Medication Adjustments/Labs and Tests Ordered: Current medicines are reviewed at length with the patient today.  Concerns regarding medicines are outlined above.  Orders Placed This Encounter  Procedures  . EKG 12-Lead   No orders of the defined types were placed in this encounter.   Patient Instructions  Medication Instructions:  Your physician recommends that you continue on your current medications as directed. Please refer to the Current Medication list given to you today.  *If you need a refill on your cardiac medications before your next appointment, please call your pharmacy*   Lab Work: None If you have labs (blood work) drawn today and your tests are completely normal, you will receive your results only by: Marland Kitchen MyChart Message (if you have MyChart) OR . A paper copy in the mail If you have any lab test that is abnormal or we need to change your treatment, we will call you to review the results.   Testing/Procedures: None   Follow-Up: At [facility], you and your health needs are our priority.  As part of our  continuing mission to provide you with exceptional heart care, we have created designated Provider Care Teams.  These Care Teams include your primary Cardiologist (physician) and Advanced Practice Providers (APPs -  Physician Assistants and Nurse Practitioners) who all work together to provide you with the care you need, when you need it.  We recommend signing up for the patient portal called "MyChart".  Sign up information is provided on this After Visit Summary.  MyChart is used to connect with patients for Virtual Visits (Telemedicine).  Patients are able to view lab/test results, encounter notes, upcoming appointments, etc.  Non-urgent messages can be sent to your provider as well.   To learn more about what you can do with MyChart, go to [email or web address].    Your next appointment:   12 month(s)  The format for your next appointment:   In Person  Provider:   You may see [name], MD or one of the following Advanced Practice Providers on your designated Care Team:    Truitt Merle, NP  Cecilie Kicks, NP  Kathyrn Drown, NP    Other Instructions      Signed, [name], MD  04/03/2020 9:02 AM    [place]

## 2020-04-03 ENCOUNTER — Ambulatory Visit (INDEPENDENT_AMBULATORY_CARE_PROVIDER_SITE_OTHER): Payer: Medicare Other | Admitting: Interventional Cardiology

## 2020-04-03 ENCOUNTER — Ambulatory Visit (INDEPENDENT_AMBULATORY_CARE_PROVIDER_SITE_OTHER): Payer: Medicare Other | Admitting: *Deleted

## 2020-04-03 ENCOUNTER — Other Ambulatory Visit: Payer: Self-pay

## 2020-04-03 ENCOUNTER — Encounter: Payer: Self-pay | Admitting: Interventional Cardiology

## 2020-04-03 VITALS — BP 96/64 | HR 64 | Ht 73.0 in | Wt 243.5 lb

## 2020-04-03 DIAGNOSIS — Z5181 Encounter for therapeutic drug level monitoring: Secondary | ICD-10-CM | POA: Diagnosis not present

## 2020-04-03 DIAGNOSIS — Z7901 Long term (current) use of anticoagulants: Secondary | ICD-10-CM

## 2020-04-03 DIAGNOSIS — Z9889 Other specified postprocedural states: Secondary | ICD-10-CM

## 2020-04-03 DIAGNOSIS — E785 Hyperlipidemia, unspecified: Secondary | ICD-10-CM

## 2020-04-03 DIAGNOSIS — G459 Transient cerebral ischemic attack, unspecified: Secondary | ICD-10-CM

## 2020-04-03 DIAGNOSIS — I10 Essential (primary) hypertension: Secondary | ICD-10-CM | POA: Diagnosis not present

## 2020-04-03 DIAGNOSIS — I480 Paroxysmal atrial fibrillation: Secondary | ICD-10-CM

## 2020-04-03 DIAGNOSIS — Z7189 Other specified counseling: Secondary | ICD-10-CM | POA: Diagnosis not present

## 2020-04-03 DIAGNOSIS — Z950 Presence of cardiac pacemaker: Secondary | ICD-10-CM

## 2020-04-03 DIAGNOSIS — Z8679 Personal history of other diseases of the circulatory system: Secondary | ICD-10-CM | POA: Diagnosis not present

## 2020-04-03 LAB — POCT INR: INR: 3.2 — AB (ref 2.0–3.0)

## 2020-04-03 NOTE — Patient Instructions (Signed)

## 2020-04-03 NOTE — Patient Instructions (Signed)
Description   Hold today, then continue taking 1 tablet daily.  Recheck in 4 weeks. Call clinic with any concerns [phone] fax number [phone]

## 2020-04-05 ENCOUNTER — Other Ambulatory Visit: Payer: Self-pay | Admitting: Interventional Cardiology

## 2020-04-07 ENCOUNTER — Other Ambulatory Visit: Payer: Self-pay | Admitting: Internal Medicine

## 2020-04-28 ENCOUNTER — Ambulatory Visit (INDEPENDENT_AMBULATORY_CARE_PROVIDER_SITE_OTHER): Payer: Medicare Other | Admitting: *Deleted

## 2020-04-28 ENCOUNTER — Other Ambulatory Visit: Payer: Self-pay

## 2020-04-28 DIAGNOSIS — G459 Transient cerebral ischemic attack, unspecified: Secondary | ICD-10-CM

## 2020-04-28 DIAGNOSIS — I480 Paroxysmal atrial fibrillation: Secondary | ICD-10-CM | POA: Diagnosis not present

## 2020-04-28 DIAGNOSIS — Z5181 Encounter for therapeutic drug level monitoring: Secondary | ICD-10-CM | POA: Diagnosis not present

## 2020-04-28 LAB — POCT INR: INR: 3.5 — AB (ref 2.0–3.0)

## 2020-04-28 NOTE — Patient Instructions (Addendum)
Description   Hold today's dose, then start taking 1 tablet daily except 1/2 tablet on Sundays.  Recheck in 3 weeks. Call clinic with any concerns [phone] fax number [phone]

## 2020-05-15 ENCOUNTER — Other Ambulatory Visit: Payer: Self-pay | Admitting: Interventional Cardiology

## 2020-05-21 ENCOUNTER — Ambulatory Visit (INDEPENDENT_AMBULATORY_CARE_PROVIDER_SITE_OTHER): Payer: Medicare Other | Admitting: Pharmacist

## 2020-05-21 ENCOUNTER — Other Ambulatory Visit: Payer: Self-pay

## 2020-05-21 DIAGNOSIS — I480 Paroxysmal atrial fibrillation: Secondary | ICD-10-CM | POA: Diagnosis not present

## 2020-05-21 DIAGNOSIS — Z5181 Encounter for therapeutic drug level monitoring: Secondary | ICD-10-CM

## 2020-05-21 DIAGNOSIS — G459 Transient cerebral ischemic attack, unspecified: Secondary | ICD-10-CM | POA: Diagnosis not present

## 2020-05-21 LAB — POCT INR: INR: 3.9 — AB (ref 2.0–3.0)

## 2020-05-21 NOTE — Patient Instructions (Signed)
Hold today's dose, then start taking 1 tablet daily except 1/2 tablet on Sundays.  Recheck in 2 weeks. Call clinic with any concerns [phone] fax number [phone]

## 2020-05-26 DIAGNOSIS — F332 Major depressive disorder, recurrent severe without psychotic features: Secondary | ICD-10-CM | POA: Diagnosis not present

## 2020-05-28 DIAGNOSIS — Z23 Encounter for immunization: Secondary | ICD-10-CM | POA: Diagnosis not present

## 2020-06-01 DIAGNOSIS — Z23 Encounter for immunization: Secondary | ICD-10-CM | POA: Diagnosis not present

## 2020-06-04 ENCOUNTER — Other Ambulatory Visit: Payer: Self-pay

## 2020-06-04 ENCOUNTER — Ambulatory Visit (INDEPENDENT_AMBULATORY_CARE_PROVIDER_SITE_OTHER): Payer: Medicare Other | Admitting: *Deleted

## 2020-06-04 ENCOUNTER — Ambulatory Visit (INDEPENDENT_AMBULATORY_CARE_PROVIDER_SITE_OTHER): Payer: Medicare Other | Admitting: Emergency Medicine

## 2020-06-04 ENCOUNTER — Encounter: Payer: Self-pay | Admitting: Emergency Medicine

## 2020-06-04 DIAGNOSIS — R05 Cough: Secondary | ICD-10-CM | POA: Diagnosis not present

## 2020-06-04 DIAGNOSIS — Z5181 Encounter for therapeutic drug level monitoring: Secondary | ICD-10-CM | POA: Diagnosis not present

## 2020-06-04 DIAGNOSIS — F331 Major depressive disorder, recurrent, moderate: Secondary | ICD-10-CM | POA: Diagnosis not present

## 2020-06-04 DIAGNOSIS — I4891 Unspecified atrial fibrillation: Secondary | ICD-10-CM | POA: Diagnosis not present

## 2020-06-04 DIAGNOSIS — G459 Transient cerebral ischemic attack, unspecified: Secondary | ICD-10-CM

## 2020-06-04 DIAGNOSIS — I10 Essential (primary) hypertension: Secondary | ICD-10-CM | POA: Diagnosis not present

## 2020-06-04 DIAGNOSIS — R053 Chronic cough: Secondary | ICD-10-CM

## 2020-06-04 DIAGNOSIS — G4700 Insomnia, unspecified: Secondary | ICD-10-CM | POA: Diagnosis not present

## 2020-06-04 DIAGNOSIS — R9389 Abnormal findings on diagnostic imaging of other specified body structures: Secondary | ICD-10-CM | POA: Diagnosis not present

## 2020-06-04 DIAGNOSIS — F3341 Major depressive disorder, recurrent, in partial remission: Secondary | ICD-10-CM | POA: Diagnosis not present

## 2020-06-04 DIAGNOSIS — F325 Major depressive disorder, single episode, in full remission: Secondary | ICD-10-CM | POA: Diagnosis not present

## 2020-06-04 DIAGNOSIS — E1142 Type 2 diabetes mellitus with diabetic polyneuropathy: Secondary | ICD-10-CM | POA: Diagnosis not present

## 2020-06-04 DIAGNOSIS — E782 Mixed hyperlipidemia: Secondary | ICD-10-CM | POA: Diagnosis not present

## 2020-06-04 DIAGNOSIS — N400 Enlarged prostate without lower urinary tract symptoms: Secondary | ICD-10-CM | POA: Diagnosis not present

## 2020-06-04 DIAGNOSIS — E113293 Type 2 diabetes mellitus with mild nonproliferative diabetic retinopathy without macular edema, bilateral: Secondary | ICD-10-CM | POA: Diagnosis not present

## 2020-06-04 DIAGNOSIS — I480 Paroxysmal atrial fibrillation: Secondary | ICD-10-CM | POA: Diagnosis not present

## 2020-06-04 LAB — POCT INR: INR: 2.8 (ref 2.0–3.0)

## 2020-06-04 NOTE — Patient Instructions (Signed)
Description   Continue taking the dose you have been taking, which is: 1 tablet daily. Recheck in 3 weeks. Call clinic with any concerns [phone] fax number [phone]

## 2020-06-04 NOTE — Progress Notes (Signed)
Subjective:    Patient ID: Lawrence [name]., male    DOB: [birth year], 69 y.o.   MRN: [id number]  HPI 69 year old man with a history of esophageal cancer, esophagectomy (2013) with gastric pull-through complicated by anastomotic strictures requiring EGD dilations, associated GERD.  Also with hypertension, A. fib with pacer on anticoagulation, diabetes.  He has pulmonary nodules that have been followed on CT chest, most recently. He is referred today for evaluation of chronic cough. He still deals with a lot of reflux, is on famotidine, pantoprazole. He has felt some evidence for aspiration before, can happen when he doesn't sleep on a wedge pillow. He has some sputum production, clear mucous. He is on Breo and albuterol, was started 1 year ago for the coughing. Hasn't really changed his cough much. He uses albuterol a few times a day. He does seem to benefit - makes the cough better. He does have some exertional SOB. He can hear wheeze at night and w some exertion. He has developed some allergy sx - seasonal.   Coronary CT scan 05/09/2019 reviewed, there is a calcified right middle lobe granuloma.  No other nodule seen.  CT's from [facility] reviewed, 03/06/2018, 09/07/17, 09/08/16, 09/10/15.  He has waxing and waning groundglass infiltrates as well as stable calcified and non-calcified nodular disease  ROV 04/01/20 --this follow-up visit for 69 year old gentleman with a history of esophageal cancer and surgeries, associated GERD, hypertension, atrial fibrillation (pacer), diabetes.  I saw him last month for chronic cough, felt to be significantly impacted by GERD.  Start Breo temporarily, continue pantoprazole and famotidine, wedge pillow Also has waxing and waning groundglass infiltrates on prior CT scans of the chest, some calcified nodular disease. Still with sporadic episodic cough, affects his breathing as well. Better w albuterol - uses 2-3x a day when the cough is active. The flares are happening a bit less  frequently.   Pulmonary function testing done today, reviewed by me, shows changes consistent with restriction, possible superimposed obstruction, no bronchodilator response, restrictive lung volumes, decreased diffusion capacity that corrects to normal range when adjusted for alveolar volume.  CT scan of the chest 03/02/2020 reviewed by me, shows stable right upper lobe pulmonary nodule with some calcification, no significant change compared with 05/2019, remote PET scan 2013. No GGI's  MDM: Reviewed CT chest and PFT  ROV 06/04/20 --follow-up visit.  69 year old man who follows up today for cough in the setting of GERD, waxing and waning groundglass infiltrates and some calcified nodular disease by CT scan of the chest.  Groundglass was resolved on his most recent CT scan 03/02/2020.Marland Kitchen  His pulmonary function testing shows restriction with possible superimposed obstruction.  At his last visit we continued Pepcid, Protonix, albuterol which she has used approximately once a week, usually for cough.  He reports that he has been doing well, cough is controlled - he anticipates that the cough will increase as fall arrives. Just now beginning to get some congestion.    Review of Systems As per HPI  Past Medical History:  Diagnosis Date  . Bradycardia   . GERD (gastroesophageal reflux disease)   . H/O hiatal hernia    s/p  repair 06-26-2012  . History of esophagectomy 08-21-2012  AT [facility]  . History of kidney stones 2006  . History of malignant neoplasm of esophagus ONCOLOGIST-  DR D'AMICO AT [facility]-- PER LAST NOTE 01/ 2018  NO RECURRENCE   dx 10/ 2013  Stage 2A (T2 N0)  08-21-2012 s/p  esophagectomy w/ gastric pull-through [facility]) post residual recurrent anastomotic stricture (multiple EGD w/ dilations)  . History of transient ischemic attack (TIA) 2003   no residual  . Hypertension   . Long term (current) use of anticoagulants    xarelto  . On dofetilide therapy   . PAF (paroxysmal atrial  fibrillation) [facility]) first dx 2009   primary cardiologist-  dr Daneen Schick /  EP cardiologist -- dr Lurene Shadow ([facility])  s/p  afib ablation 08-02-2016  . Pancreatic cyst    x2   . Peyronie's disease   . Presence of permanent cardiac pacemaker implanted 05-01-2014  at [facility] follows  . Pulmonary nodule, right    upper and middle lobes --  per last CT stable  . Type 2 diabetes mellitus treated with insulin ([place])   . Wears glasses      Family History  Problem Relation Age of Onset  . Cancer Father        kidney     Social History   Socioeconomic History  . Marital status: Married    Spouse name: Not on file  . Number of children: Not on file  . Years of education: Not on file  . Highest education level: Not on file  Occupational History  . Not on file  Tobacco Use  . Smoking status: Never Smoker  . Smokeless tobacco: Never Used  Substance and Sexual Activity  . Alcohol use: Yes    Comment: socially  . Drug use: No  . Sexual activity: Not on file  Other Topics Concern  . Not on file  Social History Narrative  . Not on file   Social Determinants of Health   Financial Resource Strain:   . Difficulty of Paying Living Expenses: Not on file  Food Insecurity:   . Worried About Charity fundraiser in the Last Year: Not on file  . Ran Out of Food in the Last Year: Not on file  Transportation Needs:   . Lack of Transportation (Medical): Not on file  . Lack of Transportation (Non-Medical): Not on file  Physical Activity:   . Days of Exercise per Week: Not on file  . Minutes of Exercise per Session: Not on file  Stress:   . Feeling of Stress : Not on file  Social Connections:   . Frequency of Communication with Friends and Family: Not on file  . Frequency of Social Gatherings with Friends and Family: Not on file  . Attends Religious Services: Not on file  . Active Member of Clubs or Organizations: Not on file  . Attends Archivist  Meetings: Not on file  . Marital Status: Not on file  Intimate Partner Violence:   . Fear of Current or Ex-Partner: Not on file  . Emotionally Abused: Not on file  . Physically Abused: Not on file  . Sexually Abused: Not on file     No Known Allergies   Outpatient Medications Prior to Visit  Medication Sig Dispense Refill  . albuterol (VENTOLIN HFA) 108 (90 Base) MCG/ACT inhaler once daily as needed    . amLODipine (NORVASC) 10 MG tablet Take 10 mg by mouth daily.    Marland Kitchen atorvastatin (LIPITOR) 20 MG tablet Take 20 mg by mouth daily.    . busPIRone (BUSPAR) 15 MG tablet Take 15 mg by mouth 2 (two) times daily.    . famotidine (PEPCID) 20 MG tablet Take 20 mg by mouth as needed.    Marland Kitchen  FLUoxetine HCl 60 MG TABS Take 1 capsule by mouth daily.    . hydrochlorothiazide (MICROZIDE) 12.5 MG capsule Take 12.5 mg by mouth daily.    . insulin NPH Human (NOVOLIN N) 100 UNIT/ML injection 15 Units 2 (two) times daily    . isosorbide mononitrate (IMDUR) 30 MG 24 hr tablet TAKE 1 TABLET BY MOUTH EVERY DAY 90 tablet 3  . LORazepam (ATIVAN) 1 MG tablet Take 1 mg by mouth 2 (two) times daily.     . magnesium oxide (MAG-OX) 400 MG tablet Take 400 mg by mouth daily.    . metoprolol tartrate (LOPRESSOR) 25 MG tablet TAKE 1 TABLET BY MOUTH TWICE A DAY 180 tablet 2  . pantoprazole (PROTONIX) 40 MG tablet Take 40 mg by mouth daily.    Marland Kitchen telmisartan (MICARDIS) 80 MG tablet Take 80 mg by mouth daily.    . traMADol (ULTRAM) 50 MG tablet Take 50 mg by mouth 2 (two) times daily as needed for moderate pain.    . vitamin B-12 (CYANOCOBALAMIN) 1000 MCG tablet Take 1,000 mcg by mouth every other day. Taking Mon, Wed, Fri    . warfarin (COUMADIN) 5 MG tablet Take by mouth.    . warfarin (COUMADIN) 5 MG tablet TAKE ONE TABLET BY MOUTH DAILY OR AS DIRECTED BY COUMADIN CLINIC 100 tablet 0   No facility-administered medications prior to visit.         Objective:   Physical Exam Vitals:   06/04/20 0852  BP: 132/78   Pulse: 64  Temp: 98.5 F (36.9 C)  TempSrc: Oral  SpO2: 93%  Weight: 247 lb 3.2 oz (112.1 kg)  Height: 6\' 1"  (1.854 m)   Gen: Pleasant, well-nourished, in no distress,  normal affect  ENT: No lesions,  mouth clear,  oropharynx clear, no postnasal drip  Neck: No JVD, no stridor  Lungs: No use of accessory muscles, clear B  Cardiovascular: RRR, heart sounds normal, no murmur or gallops, no peripheral edema  Musculoskeletal: No deformities, no cyanosis or clubbing  Neuro: alert, awake, non focal  Skin: Warm, no lesions or rash      Assessment & Plan:  Chronic cough GERD appears to be well controlled Needs maintenance medications for allergic rhinitis which is probably going to flare in the fall and spring months.  Continue Protonix and Pepcid as you have been taking them Start loratadine 10 mg once daily (Claritin) Start fluticasone nasal spray, 2 sprays each nostril once daily. Keep your albuterol available use 2 puffs when you need it for shortness of breath or coughing. Follow with Dr Lamonte Sakai in 6 months or sooner if you have any problems  Abnormal CT of the chest Nodular disease is stable and is groundglass infiltrates resolved.  I do not think we need to set a dedicated repeat CT scan schedule at this point.  We may decide to repeat going forward based on clinical status, other imaging.  Baltazar Apo, MD, PhD 06/04/2020, 9:16 AM [facility] [phone] or if no answer [phone]

## 2020-06-04 NOTE — Patient Instructions (Signed)
Continue Protonix and Pepcid as you have been taking them Start loratadine 10 mg once daily (Claritin) Start fluticasone nasal spray, 2 sprays each nostril once daily. Keep your albuterol available use 2 puffs when you need it for shortness of breath or coughing We will not set a dedicated time to repeat your CT scan of the chest at this point.  We can discuss it going forward depending on how your symptoms are doing. Follow with Dr Lamonte Sakai in 6 months or sooner if you have any problems

## 2020-06-04 NOTE — Assessment & Plan Note (Signed)
Nodular disease is stable and is groundglass infiltrates resolved.  I do not think we need to set a dedicated repeat CT scan schedule at this point.  We may decide to repeat going forward based on clinical status, other imaging.

## 2020-06-04 NOTE — Assessment & Plan Note (Signed)
GERD appears to be well controlled Needs maintenance medications for allergic rhinitis which is probably going to flare in the fall and spring months.  Continue Protonix and Pepcid as you have been taking them Start loratadine 10 mg once daily (Claritin) Start fluticasone nasal spray, 2 sprays each nostril once daily. Keep your albuterol available use 2 puffs when you need it for shortness of breath or coughing. Follow with Dr Lamonte Sakai in 6 months or sooner if you have any problems

## 2020-06-25 ENCOUNTER — Ambulatory Visit (INDEPENDENT_AMBULATORY_CARE_PROVIDER_SITE_OTHER): Payer: Medicare Other | Admitting: *Deleted

## 2020-06-25 ENCOUNTER — Other Ambulatory Visit: Payer: Self-pay

## 2020-06-25 DIAGNOSIS — G459 Transient cerebral ischemic attack, unspecified: Secondary | ICD-10-CM | POA: Diagnosis not present

## 2020-06-25 DIAGNOSIS — I480 Paroxysmal atrial fibrillation: Secondary | ICD-10-CM

## 2020-06-25 DIAGNOSIS — Z5181 Encounter for therapeutic drug level monitoring: Secondary | ICD-10-CM | POA: Diagnosis not present

## 2020-06-25 LAB — POCT INR: INR: 1.7 — AB (ref 2.0–3.0)

## 2020-06-25 NOTE — Patient Instructions (Signed)
Description   Take 1.5 tablets today and then continue taking the dose you have been taking, which is, 1 tablet daily. Recheck in 2 weeks. Call clinic with any concerns [phone] fax number [phone]

## 2020-06-30 ENCOUNTER — Other Ambulatory Visit: Payer: Self-pay | Admitting: Internal Medicine

## 2020-07-14 ENCOUNTER — Ambulatory Visit (INDEPENDENT_AMBULATORY_CARE_PROVIDER_SITE_OTHER): Payer: Medicare Other | Admitting: *Deleted

## 2020-07-14 ENCOUNTER — Other Ambulatory Visit: Payer: Self-pay

## 2020-07-14 DIAGNOSIS — G459 Transient cerebral ischemic attack, unspecified: Secondary | ICD-10-CM | POA: Diagnosis not present

## 2020-07-14 DIAGNOSIS — I480 Paroxysmal atrial fibrillation: Secondary | ICD-10-CM | POA: Diagnosis not present

## 2020-07-14 DIAGNOSIS — Z5181 Encounter for therapeutic drug level monitoring: Secondary | ICD-10-CM

## 2020-07-14 LAB — POCT INR: INR: 2.2 (ref 2.0–3.0)

## 2020-07-14 NOTE — Patient Instructions (Signed)
Description   Continue taking 1 tablet daily. Recheck in 3 weeks. Call clinic with any concerns [phone] fax number [phone]

## 2020-07-21 DIAGNOSIS — F332 Major depressive disorder, recurrent severe without psychotic features: Secondary | ICD-10-CM | POA: Diagnosis not present

## 2020-08-04 ENCOUNTER — Other Ambulatory Visit: Payer: Self-pay

## 2020-08-04 ENCOUNTER — Ambulatory Visit (INDEPENDENT_AMBULATORY_CARE_PROVIDER_SITE_OTHER): Payer: Medicare Other | Admitting: *Deleted

## 2020-08-04 DIAGNOSIS — Z5181 Encounter for therapeutic drug level monitoring: Secondary | ICD-10-CM | POA: Diagnosis not present

## 2020-08-04 DIAGNOSIS — I480 Paroxysmal atrial fibrillation: Secondary | ICD-10-CM | POA: Diagnosis not present

## 2020-08-04 DIAGNOSIS — G459 Transient cerebral ischemic attack, unspecified: Secondary | ICD-10-CM

## 2020-08-04 LAB — POCT INR: INR: 4 — AB (ref 2.0–3.0)

## 2020-08-04 NOTE — Patient Instructions (Addendum)
Description   Do not take any Warfarin today then continue taking 1 tablet daily. Recheck in 3 weeks. Call clinic with any concerns [phone] fax number [phone]

## 2020-08-06 DIAGNOSIS — E113291 Type 2 diabetes mellitus with mild nonproliferative diabetic retinopathy without macular edema, right eye: Secondary | ICD-10-CM | POA: Diagnosis not present

## 2020-08-06 DIAGNOSIS — E1142 Type 2 diabetes mellitus with diabetic polyneuropathy: Secondary | ICD-10-CM | POA: Diagnosis not present

## 2020-08-06 DIAGNOSIS — I10 Essential (primary) hypertension: Secondary | ICD-10-CM | POA: Diagnosis not present

## 2020-08-06 DIAGNOSIS — K219 Gastro-esophageal reflux disease without esophagitis: Secondary | ICD-10-CM | POA: Diagnosis not present

## 2020-08-06 DIAGNOSIS — I700 Atherosclerosis of aorta: Secondary | ICD-10-CM | POA: Diagnosis not present

## 2020-08-06 DIAGNOSIS — R053 Chronic cough: Secondary | ICD-10-CM | POA: Diagnosis not present

## 2020-08-06 DIAGNOSIS — E782 Mixed hyperlipidemia: Secondary | ICD-10-CM | POA: Diagnosis not present

## 2020-08-14 DIAGNOSIS — Z950 Presence of cardiac pacemaker: Secondary | ICD-10-CM | POA: Diagnosis not present

## 2020-08-25 ENCOUNTER — Ambulatory Visit (INDEPENDENT_AMBULATORY_CARE_PROVIDER_SITE_OTHER): Payer: Medicare Other | Admitting: *Deleted

## 2020-08-25 ENCOUNTER — Other Ambulatory Visit: Payer: Self-pay

## 2020-08-25 DIAGNOSIS — I480 Paroxysmal atrial fibrillation: Secondary | ICD-10-CM | POA: Diagnosis not present

## 2020-08-25 DIAGNOSIS — Z5181 Encounter for therapeutic drug level monitoring: Secondary | ICD-10-CM | POA: Diagnosis not present

## 2020-08-25 DIAGNOSIS — G459 Transient cerebral ischemic attack, unspecified: Secondary | ICD-10-CM | POA: Diagnosis not present

## 2020-08-25 LAB — POCT INR: INR: 1.7 — AB (ref 2.0–3.0)

## 2020-08-25 NOTE — Patient Instructions (Signed)
Description   Today take 1.5 tablets then continue taking 1 tablet daily. Recheck in 3 weeks. Call clinic with any concerns [phone] fax number [phone]     

## 2020-08-31 DIAGNOSIS — Z20822 Contact with and (suspected) exposure to covid-19: Secondary | ICD-10-CM | POA: Diagnosis not present

## 2020-09-15 ENCOUNTER — Ambulatory Visit (INDEPENDENT_AMBULATORY_CARE_PROVIDER_SITE_OTHER): Payer: Medicare PPO | Admitting: *Deleted

## 2020-09-15 ENCOUNTER — Other Ambulatory Visit: Payer: Self-pay

## 2020-09-15 DIAGNOSIS — Z5181 Encounter for therapeutic drug level monitoring: Secondary | ICD-10-CM | POA: Diagnosis not present

## 2020-09-15 DIAGNOSIS — G459 Transient cerebral ischemic attack, unspecified: Secondary | ICD-10-CM

## 2020-09-15 DIAGNOSIS — I480 Paroxysmal atrial fibrillation: Secondary | ICD-10-CM | POA: Diagnosis not present

## 2020-09-15 LAB — POCT INR: INR: 2 (ref 2.0–3.0)

## 2020-09-15 NOTE — Patient Instructions (Signed)
Description   Today take 1.5 tablets then continue taking 1 tablet daily. Recheck in 3 weeks. Call clinic with any concerns [phone] fax number [phone]

## 2020-10-07 ENCOUNTER — Other Ambulatory Visit: Payer: Self-pay

## 2020-10-07 ENCOUNTER — Ambulatory Visit (INDEPENDENT_AMBULATORY_CARE_PROVIDER_SITE_OTHER): Payer: Medicare PPO

## 2020-10-07 DIAGNOSIS — Z5181 Encounter for therapeutic drug level monitoring: Secondary | ICD-10-CM

## 2020-10-07 DIAGNOSIS — I480 Paroxysmal atrial fibrillation: Secondary | ICD-10-CM | POA: Diagnosis not present

## 2020-10-07 DIAGNOSIS — G459 Transient cerebral ischemic attack, unspecified: Secondary | ICD-10-CM

## 2020-10-07 LAB — POCT INR: INR: 1.9 — AB (ref 2.0–3.0)

## 2020-10-07 NOTE — Patient Instructions (Signed)
Description   Start taking 1 tablet daily except 1.5 tablets on Wednesdays. Recheck in 3 weeks. Call clinic with any concerns [phone] fax number [phone]

## 2020-10-30 ENCOUNTER — Other Ambulatory Visit: Payer: Self-pay

## 2020-10-30 ENCOUNTER — Ambulatory Visit (INDEPENDENT_AMBULATORY_CARE_PROVIDER_SITE_OTHER): Payer: Medicare PPO | Admitting: *Deleted

## 2020-10-30 DIAGNOSIS — Z5181 Encounter for therapeutic drug level monitoring: Secondary | ICD-10-CM | POA: Diagnosis not present

## 2020-10-30 DIAGNOSIS — G459 Transient cerebral ischemic attack, unspecified: Secondary | ICD-10-CM

## 2020-10-30 DIAGNOSIS — I480 Paroxysmal atrial fibrillation: Secondary | ICD-10-CM | POA: Diagnosis not present

## 2020-10-30 LAB — POCT INR: INR: 3 (ref 2.0–3.0)

## 2020-10-30 NOTE — Patient Instructions (Signed)
Description    Continue to take warfarin 1 tablet daily. Recheck INR in 4 weeks.  Call clinic with any concerns [phone] fax number [phone]

## 2020-11-27 ENCOUNTER — Ambulatory Visit (INDEPENDENT_AMBULATORY_CARE_PROVIDER_SITE_OTHER): Payer: Medicare PPO

## 2020-11-27 ENCOUNTER — Other Ambulatory Visit: Payer: Self-pay

## 2020-11-27 DIAGNOSIS — I480 Paroxysmal atrial fibrillation: Secondary | ICD-10-CM

## 2020-11-27 DIAGNOSIS — G459 Transient cerebral ischemic attack, unspecified: Secondary | ICD-10-CM

## 2020-11-27 DIAGNOSIS — Z5181 Encounter for therapeutic drug level monitoring: Secondary | ICD-10-CM

## 2020-11-27 LAB — POCT INR: INR: 3.6 — AB (ref 2.0–3.0)

## 2020-11-27 NOTE — Patient Instructions (Signed)
-   skip warfarin tonight, then - Continue to take warfarin 1 tablet daily.  - Recheck INR in 4 weeks.   Call clinic with any concerns [phone] fax number [phone]

## 2020-12-24 ENCOUNTER — Other Ambulatory Visit: Payer: Self-pay | Admitting: Interventional Cardiology

## 2020-12-25 ENCOUNTER — Telehealth: Payer: Self-pay

## 2020-12-25 ENCOUNTER — Ambulatory Visit (INDEPENDENT_AMBULATORY_CARE_PROVIDER_SITE_OTHER): Payer: Medicare PPO | Admitting: *Deleted

## 2020-12-25 ENCOUNTER — Other Ambulatory Visit: Payer: Self-pay

## 2020-12-25 DIAGNOSIS — Z5181 Encounter for therapeutic drug level monitoring: Secondary | ICD-10-CM | POA: Diagnosis not present

## 2020-12-25 DIAGNOSIS — I480 Paroxysmal atrial fibrillation: Secondary | ICD-10-CM | POA: Diagnosis not present

## 2020-12-25 DIAGNOSIS — G459 Transient cerebral ischemic attack, unspecified: Secondary | ICD-10-CM | POA: Diagnosis not present

## 2020-12-25 LAB — POCT INR: INR: 2.4 (ref 2.0–3.0)

## 2020-12-25 NOTE — Telephone Encounter (Signed)
   [facility] Pre-operative Risk Assessment    Patient Name: Lawrence [name].  DOB: 05/14/1951  MRN: [id number]   HEARTCARE STAFF: - Please ensure there is not already an duplicate clearance open for this procedure. - Under Visit Info/Reason for Call, type in Other and utilize the format Clearance MM/DD/YY or Clearance TBD. Do not use dashes or single digits. - If request is for dental extraction, please clarify the # of teeth to be extracted.  Request for surgical clearance:  1. What type of surgery is being performed? Right Knee Arthroscopy   2. When is this surgery scheduled? 01/13/21  3. What type of clearance is required (medical clearance vs. Pharmacy clearance to hold med vs. Both)? Both   4. Are there any medications that need to be held prior to surgery and how long? Plavix   5. Practice name and name of physician performing surgery? [facility], Dorna Leitz, MD  6. What is the office phone number? [phone]   7.   What is the office fax number? [phone]  8.   Anesthesia type (None, local, MAC, general) ? General    Mendel Ryder 12/25/2020, 4:27 PM  _________________________________________________________________   (provider comments below)

## 2020-12-25 NOTE — Patient Instructions (Signed)
Description    - Continue to take warfarin 1 tablet daily.  - Recheck INR in 5 weeks.   Call clinic with any concerns [phone] fax number [phone]

## 2020-12-28 NOTE — Telephone Encounter (Signed)
   Name: Lawrence [name].  DOB: 1951/08/07  MRN: [id number]   Primary Cardiologist: [name], MD  Chart reviewed as part of pre-operative protocol coverage. Patient was contacted 12/28/2020 in reference to pre-operative risk assessment for pending surgery as outlined below.  [name] was last seen on 04/03/2020 by Dr. Daneen Schick.  Since that day, Dimetrius Montfort. has done well without exertional dyspnea or chest discomfort.  He is able to accomplish more than 4 METS of activity without issue.  Therefore, based on ACC/AHA guidelines, the patient would be at acceptable risk for the planned procedure without further cardiovascular testing.   He may hold Coumadin for 5 days prior to the procedure and restart as soon as possible afterward at the surgeon's discretion.  The patient was advised that if he develops new symptoms prior to surgery to contact our office to arrange for a follow-up visit, and he verbalized understanding.  I will route this recommendation to the requesting party via Epic fax function and remove from pre-op pool. Please call with questions.  [place], [place] 12/28/2020, 5:06 PM

## 2020-12-28 NOTE — Telephone Encounter (Signed)
Patient with diagnosis of A Fib on warfarin for anticoagulation.    1. Procedure: Right Knee Arthroscopy   Date of procedure: 01/13/21   CHA2DS2-VASc Score = 6  This indicates a 9.7% annual risk of stroke. The patient's score is based upon: CHF History: No HTN History: Yes Diabetes History: Yes Stroke History: Yes Vascular Disease History: Yes Age Score: 1 Gender Score: 0   CrCl 88 mL/min Platelet count  No recent Platelet Count  Patient with A Fib and records show a TIA, however patient does not recall having a TIA.    From clearance in 2019:  Pt had INR checked today and states he does not recall having a TIA (listed in Epic as occurring in 2003).   Has no history of being prescribed Lovenox.  Patient can hold warfarin for 5 days before procedure and does not need Lovenox bridge.

## 2020-12-30 ENCOUNTER — Other Ambulatory Visit: Payer: Self-pay | Admitting: Orthopedic Surgery

## 2021-01-04 ENCOUNTER — Other Ambulatory Visit: Payer: Self-pay | Admitting: Orthopedic Surgery

## 2021-01-04 DIAGNOSIS — M25561 Pain in right knee: Secondary | ICD-10-CM

## 2021-01-04 DIAGNOSIS — M19111 Post-traumatic osteoarthritis, right shoulder: Secondary | ICD-10-CM

## 2021-01-05 ENCOUNTER — Other Ambulatory Visit: Payer: Self-pay | Admitting: Interventional Cardiology

## 2021-01-29 ENCOUNTER — Ambulatory Visit (INDEPENDENT_AMBULATORY_CARE_PROVIDER_SITE_OTHER): Payer: Medicare PPO

## 2021-01-29 ENCOUNTER — Other Ambulatory Visit: Payer: Self-pay

## 2021-01-29 DIAGNOSIS — I480 Paroxysmal atrial fibrillation: Secondary | ICD-10-CM | POA: Diagnosis not present

## 2021-01-29 DIAGNOSIS — Z5181 Encounter for therapeutic drug level monitoring: Secondary | ICD-10-CM | POA: Diagnosis not present

## 2021-01-29 DIAGNOSIS — G459 Transient cerebral ischemic attack, unspecified: Secondary | ICD-10-CM | POA: Diagnosis not present

## 2021-01-29 LAB — POCT INR: INR: 2 (ref 2.0–3.0)

## 2021-01-29 NOTE — Patient Instructions (Signed)
Description   Continue to take warfarin 1 tablet daily. Recheck INR in 6 weeks.  Call clinic with any concerns [phone] fax number [phone]

## 2021-02-24 ENCOUNTER — Telehealth: Payer: Self-pay | Admitting: Emergency Medicine

## 2021-02-24 NOTE — Telephone Encounter (Signed)
I have called the pts wife and she is ware of last OV note that has been sent to [facility] for Dr. Elenor Quinones for his referral appt. Nothing further is needed.

## 2021-02-24 NOTE — Telephone Encounter (Signed)
Correct fax number is; [phone] for Dr. Elenor Quinones at [facility]

## 2021-03-05 ENCOUNTER — Ambulatory Visit: Payer: Medicare PPO | Admitting: Primary Care

## 2021-03-05 ENCOUNTER — Encounter: Payer: Self-pay | Admitting: Primary Care

## 2021-03-05 ENCOUNTER — Other Ambulatory Visit: Payer: Self-pay

## 2021-03-05 ENCOUNTER — Telehealth: Payer: Self-pay | Admitting: Primary Care

## 2021-03-05 ENCOUNTER — Ambulatory Visit (INDEPENDENT_AMBULATORY_CARE_PROVIDER_SITE_OTHER): Payer: Medicare PPO

## 2021-03-05 DIAGNOSIS — G459 Transient cerebral ischemic attack, unspecified: Secondary | ICD-10-CM | POA: Diagnosis not present

## 2021-03-05 DIAGNOSIS — R053 Chronic cough: Secondary | ICD-10-CM

## 2021-03-05 DIAGNOSIS — I480 Paroxysmal atrial fibrillation: Secondary | ICD-10-CM

## 2021-03-05 DIAGNOSIS — Z5181 Encounter for therapeutic drug level monitoring: Secondary | ICD-10-CM

## 2021-03-05 LAB — POCT INR: INR: 4.4 — AB (ref 2.0–3.0)

## 2021-03-05 MED ORDER — HYDROCODONE BIT-HOMATROP MBR 5-1.5 MG/5ML PO SOLN
5.0000 mL | Freq: Four times a day (QID) | ORAL | 0 refills | Status: DC | PRN
Start: 2021-03-05 — End: 2021-03-10

## 2021-03-05 MED ORDER — BENZONATATE 100 MG PO CAPS: 200.0000 mg | ORAL_CAPSULE | Freq: Three times a day (TID) | ORAL | 1 refills | Status: DC | PRN

## 2021-03-05 MED ORDER — HYDROCODONE BIT-HOMATROP MBR 5-1.5 MG/5ML PO SOLN
5.0000 mL | Freq: Four times a day (QID) | ORAL | 0 refills | Status: DC | PRN
Start: 2021-03-05 — End: 2021-03-05

## 2021-03-05 NOTE — Progress Notes (Signed)
@Patient  ID: Lawrence [name]., male    DOB: [birth year], 70 y.o.   MRN: [id number]  Chief Complaint  Patient presents with   Follow-up    Referring provider: [name], MD  HPI: 70 year old male, never smoked.  Past medical history significant for chronic cough, abnormal CT chest.  Patient of Dr. Lamonte Sakai, last seen in office on 06/04/2020.  03/05/2021 During last visit with Dr. Lamonte Sakai nodular disease appeared stable and groundglass infiltrates had resolved.  Dedicated repeat CT was not recommended at this point.  He is on Protonix and Pepcid for GERD symptoms.  He was advised to start loratadine and Flonase nasal spray for chronic cough symptoms during last visit.    Patient presents today for a overdue 28-month follow-up.  Cough is not getting better. Somedays it is much worse. He coughs vigorously 50-100 times a hour. At times, cough will cause him to vomit. He is complaint with GERD medication. He did not try loratadine or Flonase as he does not feel his cough is related to allergies/post nasal drip. He had esophageal cancer, esophagectomy (2013) with gastric pull-through complicated by anastomotic strictures requiring EGD dilations. Patient is scheduled to see [facility] for thoracic consultation.  Pulmonary function testing shows changes consistent with restriction, possible superimposed obstruction, no bronchodilator response, restrictive lung volumes, decreased diffusion capacity that corrects to normal range when adjusted for alveolar volume.  CT chest in June 2021 showed stable subpleural nodule and adjacent calcified nodules posterior right upper lobe and right middle lobe with associated volume loss, compatible with scarring and old granulomatous disease.  No Known Allergies  Immunization History  Administered Date(s) Administered   Influenza-Unspecified 07/02/2014, 06/08/2015, 05/07/2016, 06/08/2017, 06/06/2018   PFIZER Comirnaty(Gray Top)Covid-19 Tri-Sucrose Vaccine 12/12/2020    PFIZER(Purple Top)SARS-COV-2 Vaccination 10/20/2019, 11/11/2019, 06/01/2020   Tdap 03/31/2017   Zoster Recombinat (Shingrix) 04/12/2018    Past Medical History:  Diagnosis Date   Bradycardia    GERD (gastroesophageal reflux disease)    H/O hiatal hernia    s/p  repair 06-26-2012   History of esophagectomy 08-21-2012  AT [facility]   History of kidney stones 2006   History of malignant neoplasm of esophagus ONCOLOGIST-  DR D'AMICO AT [facility]-- PER LAST NOTE 01/ 2018  NO RECURRENCE   dx 10/ 2013  Stage 2A (T2 N0)  08-21-2012 s/p  esophagectomy w/ gastric pull-through [facility]) post residual recurrent anastomotic stricture (multiple EGD w/ dilations)   History of transient ischemic attack (TIA) 2003   no residual   Hypertension    Long term (current) use of anticoagulants    xarelto   On dofetilide therapy    PAF (paroxysmal atrial fibrillation) ([place]) first dx 2009   primary cardiologist-  dr Daneen Schick /  EP cardiologist -- dr Lurene Shadow ([facility])  s/p  afib ablation 08-02-2016   Pancreatic cyst    x2    Peyronie's disease    Presence of permanent cardiac pacemaker implanted 05-01-2014  at [facility] follows   Pulmonary nodule, right    upper and middle lobes --  per last CT stable   Type 2 diabetes mellitus treated with insulin (HCC)    Wears glasses     Tobacco History: Social History   Tobacco Use  Smoking Status Never  Smokeless Tobacco Never   Counseling given: Not Answered   Outpatient Medications Prior to Visit  Medication Sig Dispense Refill   albuterol (VENTOLIN HFA) 108 (90 Base) MCG/ACT inhaler once daily as  needed     amLODipine (NORVASC) 10 MG tablet Take 10 mg by mouth daily.     atorvastatin (LIPITOR) 20 MG tablet Take 20 mg by mouth daily.     busPIRone (BUSPAR) 15 MG tablet Take 15 mg by mouth 2 (two) times daily.     famotidine (PEPCID) 20 MG tablet Take 20 mg by mouth as needed.     FLUoxetine HCl 60 MG TABS Take 1 capsule by mouth  daily.     hydrochlorothiazide (MICROZIDE) 12.5 MG capsule Take 12.5 mg by mouth daily.     insulin NPH Human (NOVOLIN N) 100 UNIT/ML injection 15 Units 2 (two) times daily     isosorbide mononitrate (IMDUR) 30 MG 24 hr tablet TAKE 1 TABLET BY MOUTH EVERY DAY 90 tablet 3   LORazepam (ATIVAN) 1 MG tablet Take 1 mg by mouth 2 (two) times daily.      magnesium oxide (MAG-OX) 400 MG tablet Take 400 mg by mouth daily.     metoprolol tartrate (LOPRESSOR) 25 MG tablet TAKE 1 TABLET BY MOUTH TWICE A DAY 180 tablet 1   pantoprazole (PROTONIX) 40 MG tablet Take 40 mg by mouth daily.     telmisartan (MICARDIS) 80 MG tablet Take 80 mg by mouth daily.     traMADol (ULTRAM) 50 MG tablet Take 50 mg by mouth 2 (two) times daily as needed for moderate pain.     vitamin B-12 (CYANOCOBALAMIN) 1000 MCG tablet Take 1,000 mcg by mouth every other day. Taking Mon, Wed, Fri     warfarin (COUMADIN) 5 MG tablet TAKE ONE TABLET BY MOUTH DAILY OR AS DIRECTED BY COUMADIN CLINIC 90 tablet 2   No facility-administered medications prior to visit.    Review of Systems  Review of Systems  Constitutional: Negative.   Respiratory:  Positive for cough. Negative for chest tightness, shortness of breath and wheezing.     Physical Exam  BP 128/82 (BP Location: Right Arm, Patient Position: Sitting, Cuff Size: Large)   Pulse 88   Temp 98.4 F (36.9 C) (Oral)   Ht 6\' 1"  (1.854 m)   Wt 241 lb 9.6 oz (109.6 kg)   SpO2 96%   BMI 31.88 kg/m  Physical Exam Constitutional:      Appearance: Normal appearance.  Cardiovascular:     Rate and Rhythm: Normal rate and regular rhythm.  Pulmonary:     Effort: Pulmonary effort is normal.     Breath sounds: Normal breath sounds. No wheezing or rales.  Neurological:     Mental Status: He is alert.     Lab Results:  CBC    Component Value Date/Time   WBC 6.8 05/26/2016 0928   RBC 4.55 05/26/2016 0928   HGB 14.3 12/09/2016 0629   HCT 42.0 12/09/2016 0629   PLT 193  05/26/2016 0928   MCV 96.7 05/26/2016 0928   MCH 33.0 05/26/2016 0928   MCHC 34.1 05/26/2016 0928   RDW 12.5 05/26/2016 0928   LYMPHSABS 2.1 05/26/2016 0928   MONOABS 0.6 05/26/2016 0928   EOSABS 0.2 05/26/2016 0928   BASOSABS 0.1 05/26/2016 0928    BMET    Component Value Date/Time   NA 135 05/14/2019 1027   K 4.9 05/14/2019 1027   CL 99 05/14/2019 1027   CO2 24 05/14/2019 1027   GLUCOSE 159 (H) 05/14/2019 1027   GLUCOSE 166 (H) 12/09/2016 0629   BUN 19 05/14/2019 1027   CREATININE 1.29 (H) 05/14/2019 1027   CREATININE 1.16 04/13/2012  0825   CALCIUM 9.3 05/14/2019 1027   GFRNONAA 57 (L) 05/14/2019 1027   GFRAA 66 05/14/2019 1027    BNP No results found for: BNP  ProBNP    Component Value Date/Time   PROBNP 291 04/19/2019 1631    Imaging: No results found.   Assessment & Plan:   Chronic cough Cough is worse, it is vigorous at times and intractable. GERD appears controlled, he has not trial medication for possible underlying allergies/PND that could be contributing. We feels his cough is likely related to his hx esophageal cancer and prior surgical procedures. Recommend he continue GERD medication and advised he trial Loratadine and fluticasone nasal spray. We will add tessalon perles/hycodan cough syrup to help with cough supression. Safe precautions were reviewed with patient. He will not combine with other sedating medication or alcohol. He will be following up with thoracic surgery at [facility].    Recommendations: - Continue Protonix and Pepcid  - Trial Claritin and Flonase for 2 week period when off cough suppressant to see if this helps  - Take tessalon perles three times a day for cough suppression - You can take prescription cough medication at bedtime and as needed during severe coughing fits only (use caution with ativan)   Follow-up: - 6 months with Dr. Lamonte Sakai or sooner if needed    Martyn Ehrich, NP 03/09/2021

## 2021-03-05 NOTE — Telephone Encounter (Signed)
Sent to [facility]

## 2021-03-05 NOTE — Telephone Encounter (Signed)
Pts wife stated that the [facility] does not have the [facility]cod[facility] in stock.  They are wanting to see if this can be sent to [facility] on [address].  EW please advise. Thanks

## 2021-03-05 NOTE — Patient Instructions (Signed)
Description   Skip today and tomorrow's dosage of Warfarin, then start taking 1 tablet daily except 1/2 tablet on Fridays.  Recheck INR in 2 weeks.  Call clinic with any concerns [phone] fax number [phone]

## 2021-03-05 NOTE — Patient Instructions (Signed)
Recommendations: - Continue Protonix and pepcid  - Trial Claritin and Flonase for 2 week period when off cough suppressant to see if this helps  - Take tessalon perles three times a day for cough suppression - You can take prescription cough medication at bedtime and as needed during severe coughing fits only (use caution with ativan)   Follow-up: - 6 months with Dr. Lamonte Sakai or sooner if needed

## 2021-03-05 NOTE — Telephone Encounter (Signed)
Left detailed message on patient's answering machine. Advising him that his prescription has been sent into [facility]. If he has any questions give the office a call.  Nothing further needed at this time.

## 2021-03-09 NOTE — Assessment & Plan Note (Signed)
Cough is worse, it is vigorous at times and intractable. GERD appears controlled, he has not trial medication for possible underlying allergies/PND that could be contributing. We feels his cough is likely related to his hx esophageal cancer and prior surgical procedures. Recommend he continue GERD medication and advised he trial Loratadine and fluticasone nasal spray. We will add tessalon perles/hycodan cough syrup to help with cough supression. Safe precautions were reviewed with patient. He will not combine with other sedating medication or alcohol. He will be following up with thoracic surgery at [facility].    Recommendations: - Continue Protonix and Pepcid  - Trial Claritin and Flonase for 2 week period when off cough suppressant to see if this helps  - Take tessalon perles three times a day for cough suppression - You can take prescription cough medication at bedtime and as needed during severe coughing fits only (use caution with ativan)   Follow-up: - 6 months with Dr. Lamonte Sakai or sooner if needed

## 2021-03-10 ENCOUNTER — Telehealth: Payer: Self-pay

## 2021-03-10 NOTE — Telephone Encounter (Signed)
Noted.  Will close encounter.  

## 2021-03-10 NOTE — Telephone Encounter (Signed)
It says he filled it on 03/05/21 at [facility]

## 2021-03-10 NOTE — Telephone Encounter (Signed)
Received request from pharmacy. They do not carry Hycodan or Hydromet. Requesting to send to a different pharmacy or switching to Promethazine/DM, Promethazine w/codeine or Guaifenesin w/codeine.    Beth please advise  Thank you

## 2021-03-22 ENCOUNTER — Ambulatory Visit (INDEPENDENT_AMBULATORY_CARE_PROVIDER_SITE_OTHER): Payer: Medicare PPO | Admitting: *Deleted

## 2021-03-22 ENCOUNTER — Other Ambulatory Visit: Payer: Self-pay

## 2021-03-22 DIAGNOSIS — I480 Paroxysmal atrial fibrillation: Secondary | ICD-10-CM | POA: Diagnosis not present

## 2021-03-22 DIAGNOSIS — G459 Transient cerebral ischemic attack, unspecified: Secondary | ICD-10-CM | POA: Diagnosis not present

## 2021-03-22 DIAGNOSIS — Z5181 Encounter for therapeutic drug level monitoring: Secondary | ICD-10-CM

## 2021-03-22 LAB — POCT INR: INR: 4.2 — AB (ref 2.0–3.0)

## 2021-03-22 NOTE — Patient Instructions (Signed)
Description   Hold warfarin tomorrow then start taking warfarin 1 tablet daily except for 1/2 a tablet on Mondays, Wednesdays and Fridays. Recheck INR in 2 weeks. Coumadin Clinic [phone].

## 2021-03-25 DIAGNOSIS — Z0000 Encounter for general adult medical examination without abnormal findings: Secondary | ICD-10-CM | POA: Diagnosis not present

## 2021-03-25 DIAGNOSIS — E1142 Type 2 diabetes mellitus with diabetic polyneuropathy: Secondary | ICD-10-CM | POA: Diagnosis not present

## 2021-03-25 DIAGNOSIS — F411 Generalized anxiety disorder: Secondary | ICD-10-CM | POA: Diagnosis not present

## 2021-03-25 DIAGNOSIS — E782 Mixed hyperlipidemia: Secondary | ICD-10-CM | POA: Diagnosis not present

## 2021-03-25 DIAGNOSIS — Z125 Encounter for screening for malignant neoplasm of prostate: Secondary | ICD-10-CM | POA: Diagnosis not present

## 2021-03-25 DIAGNOSIS — Z1389 Encounter for screening for other disorder: Secondary | ICD-10-CM | POA: Diagnosis not present

## 2021-03-25 DIAGNOSIS — N400 Enlarged prostate without lower urinary tract symptoms: Secondary | ICD-10-CM | POA: Diagnosis not present

## 2021-03-25 DIAGNOSIS — I10 Essential (primary) hypertension: Secondary | ICD-10-CM | POA: Diagnosis not present

## 2021-03-25 DIAGNOSIS — Z794 Long term (current) use of insulin: Secondary | ICD-10-CM | POA: Diagnosis not present

## 2021-03-25 DIAGNOSIS — E113293 Type 2 diabetes mellitus with mild nonproliferative diabetic retinopathy without macular edema, bilateral: Secondary | ICD-10-CM | POA: Diagnosis not present

## 2021-03-25 DIAGNOSIS — K219 Gastro-esophageal reflux disease without esophagitis: Secondary | ICD-10-CM | POA: Diagnosis not present

## 2021-03-25 DIAGNOSIS — F325 Major depressive disorder, single episode, in full remission: Secondary | ICD-10-CM | POA: Diagnosis not present

## 2021-03-25 DIAGNOSIS — I700 Atherosclerosis of aorta: Secondary | ICD-10-CM | POA: Diagnosis not present

## 2021-04-05 ENCOUNTER — Ambulatory Visit (INDEPENDENT_AMBULATORY_CARE_PROVIDER_SITE_OTHER): Payer: Medicare PPO

## 2021-04-05 ENCOUNTER — Other Ambulatory Visit: Payer: Self-pay

## 2021-04-05 DIAGNOSIS — Z5181 Encounter for therapeutic drug level monitoring: Secondary | ICD-10-CM

## 2021-04-05 DIAGNOSIS — I480 Paroxysmal atrial fibrillation: Secondary | ICD-10-CM | POA: Diagnosis not present

## 2021-04-05 DIAGNOSIS — G459 Transient cerebral ischemic attack, unspecified: Secondary | ICD-10-CM

## 2021-04-05 LAB — POCT INR: INR: 2.6 (ref 2.0–3.0)

## 2021-04-05 NOTE — Patient Instructions (Signed)
Description   Continue taking warfarin 1 tablet daily except for 1/2 a tablet on Mondays, Wednesdays and Fridays. Recheck INR in 4 weeks. Coumadin Clinic [phone].

## 2021-04-08 DIAGNOSIS — R918 Other nonspecific abnormal finding of lung field: Secondary | ICD-10-CM | POA: Diagnosis not present

## 2021-04-08 DIAGNOSIS — Z9889 Other specified postprocedural states: Secondary | ICD-10-CM | POA: Diagnosis not present

## 2021-04-08 DIAGNOSIS — R053 Chronic cough: Secondary | ICD-10-CM | POA: Diagnosis not present

## 2021-04-08 DIAGNOSIS — Z9049 Acquired absence of other specified parts of digestive tract: Secondary | ICD-10-CM | POA: Diagnosis not present

## 2021-04-08 DIAGNOSIS — R911 Solitary pulmonary nodule: Secondary | ICD-10-CM | POA: Diagnosis not present

## 2021-04-08 DIAGNOSIS — C159 Malignant neoplasm of esophagus, unspecified: Secondary | ICD-10-CM | POA: Diagnosis not present

## 2021-04-09 NOTE — Telephone Encounter (Signed)
Called pt to discuss symptoms and he did not answer- LMTCB

## 2021-04-09 NOTE — Telephone Encounter (Signed)
EW please advise if the hycodan can be sent to the pharmacy for a refill.  Pt has refilled the benzonatate.  This has helped his cough.  Thanks

## 2021-04-12 MED ORDER — HYDROCODONE BIT-HOMATROP MBR 5-1.5 MG/5ML PO SOLN
5.0000 mL | Freq: Four times a day (QID) | ORAL | 0 refills | Status: DC | PRN
Start: 2021-04-12 — End: 2021-04-16

## 2021-04-12 NOTE — Telephone Encounter (Signed)
Dr Lamonte Sakai, please advise on hycodan refill, thanks

## 2021-04-12 NOTE — Telephone Encounter (Signed)
I sent a script to [facility].

## 2021-04-12 NOTE — Telephone Encounter (Signed)
That's fine, can you send the hycodan prescription to his primary pulmonary MD if on- I dont have the application on my phone. Or call it in

## 2021-04-15 ENCOUNTER — Other Ambulatory Visit: Payer: Self-pay | Admitting: Emergency Medicine

## 2021-04-16 ENCOUNTER — Telehealth: Payer: Self-pay | Admitting: Emergency Medicine

## 2021-04-16 MED ORDER — HYDROCODONE BIT-HOMATROP MBR 5-1.5 MG/5ML PO SOLN
5.0000 mL | Freq: Four times a day (QID) | ORAL | 0 refills | Status: DC | PRN
Start: 2021-04-16 — End: 2022-04-04

## 2021-04-16 NOTE — Telephone Encounter (Signed)
I changed it to the new pharmacy

## 2021-04-16 NOTE — Telephone Encounter (Signed)
Called and spoke with Patient.  Patient stated Dr. Lamonte Sakai sent his Hycodan prescription to [place] [facility].  Patient stated [place] is currently out of the mixture and will not have it in until next week. Patient stated he called [facility] and was told they have Hycodan available.  Patient requested a new prescription for Hycodan be sent to [place] routed to Dr. Lamonte Sakai to advise

## 2021-04-16 NOTE — Telephone Encounter (Signed)
Left detailed msg for the pt letting her know rx was sent to preferred pharm

## 2021-05-04 DIAGNOSIS — E1142 Type 2 diabetes mellitus with diabetic polyneuropathy: Secondary | ICD-10-CM | POA: Diagnosis not present

## 2021-05-04 DIAGNOSIS — G459 Transient cerebral ischemic attack, unspecified: Secondary | ICD-10-CM | POA: Diagnosis not present

## 2021-05-04 DIAGNOSIS — K219 Gastro-esophageal reflux disease without esophagitis: Secondary | ICD-10-CM | POA: Diagnosis not present

## 2021-05-04 DIAGNOSIS — I10 Essential (primary) hypertension: Secondary | ICD-10-CM | POA: Diagnosis not present

## 2021-05-04 DIAGNOSIS — I4891 Unspecified atrial fibrillation: Secondary | ICD-10-CM | POA: Diagnosis not present

## 2021-05-04 DIAGNOSIS — F325 Major depressive disorder, single episode, in full remission: Secondary | ICD-10-CM | POA: Diagnosis not present

## 2021-05-04 DIAGNOSIS — N400 Enlarged prostate without lower urinary tract symptoms: Secondary | ICD-10-CM | POA: Diagnosis not present

## 2021-05-04 DIAGNOSIS — F331 Major depressive disorder, recurrent, moderate: Secondary | ICD-10-CM | POA: Diagnosis not present

## 2021-05-04 DIAGNOSIS — E782 Mixed hyperlipidemia: Secondary | ICD-10-CM | POA: Diagnosis not present

## 2021-05-05 ENCOUNTER — Telehealth: Payer: Self-pay | Admitting: *Deleted

## 2021-05-05 DIAGNOSIS — Z86010 Personal history of colonic polyps: Secondary | ICD-10-CM | POA: Diagnosis not present

## 2021-05-05 DIAGNOSIS — R1310 Dysphagia, unspecified: Secondary | ICD-10-CM | POA: Diagnosis not present

## 2021-05-05 DIAGNOSIS — Z8679 Personal history of other diseases of the circulatory system: Secondary | ICD-10-CM | POA: Diagnosis not present

## 2021-05-05 DIAGNOSIS — Z8501 Personal history of malignant neoplasm of esophagus: Secondary | ICD-10-CM | POA: Diagnosis not present

## 2021-05-05 DIAGNOSIS — K449 Diaphragmatic hernia without obstruction or gangrene: Secondary | ICD-10-CM | POA: Diagnosis not present

## 2021-05-05 NOTE — Telephone Encounter (Signed)
   [facility] Pre-operative Risk Assessment    Patient Name: Sherron Mummert.  DOB: 1950/11/27 MRN: [id number]  HEARTCARE STAFF:  - IMPORTANT!!!!!! Under Visit Info/Reason for Call, type in Other and utilize the format Clearance MM/DD/YY or Clearance TBD. Do not use dashes or single digits. - Please review there is not already an duplicate clearance open for this procedure. - If request is for dental extraction, please clarify the # of teeth to be extracted. - If the patient is currently at the dentist's office, call Pre-Op Callback Staff (MA/nurse) to input urgent request.  - If the patient is not currently in the dentist office, please route to the Pre-Op pool.  Request for surgical clearance:  What type of surgery is being performed? COLONOSCOPY  When is this surgery scheduled? 05/17/21  What type of clearance is required (medical clearance vs. Pharmacy clearance to hold med vs. Both)? BOTH  Are there any medications that need to be held prior to surgery and how long?  COUMADIN x 5 DAYS PRIOR  Practice name and name of physician performing surgery? [facility]; DR. Alessandra Bevels  What is the office phone number? [phone]   7.   What is the office fax number? [phone]  8.   Anesthesia type (None, local, MAC, general) ? PROPOFOL   Julaine Hua 05/05/2021, 5:43 PM  _________________________________________________________________   (provider comments below)

## 2021-05-05 NOTE — Progress Notes (Signed)
Cardiology Office Note:    Date:  05/07/2021   ID:  Lawrence [name]., DOB 1951-01-06, MRN [id number]  PCP:  [name], MD  Cardiologist:  [name], MD   Referring MD: [name], MD   No chief complaint on file.   History of Present Illness:    Lawrence [name]. is a 70 y.o. male with a hx of atrial fibrillation (Dr.Daubert at [facility]) , dofetilide therapy, chronic anticoagulation, hypertension, TIA, and hyperlipidemia. Prior history of esophagectomy for esophageal cancer 2013.  He had atrial fibrillation at [facility] in 2018 and has had no subsequent atrial fib.  Dofetilide has been discontinued since November 2018.   He is here to have colonoscopy clearance.  He also has another procedure scheduled for November 2022 at [facility] which will include repair of paraesophageal hiatal hernia with associated partial esophagectomy.  He is doing okay.  We will have major surgery on his esophagus in November at [facility].  Has upcoming colonoscopy which we are asked to clear the patient for and to give input concerning anticoagulation.  He is on Coumadin.  He is on antiarrhythmic drug therapy and has had no significant prolonged episodes of atrial fibrillation that are apparent from chart review.  He denies orthopnea and PND.  He has not had syncope although he does get somewhat dizzy when he stands.  He denies angina.  Past Medical History:  Diagnosis Date   Bradycardia    GERD (gastroesophageal reflux disease)    H/O hiatal hernia    s/p  repair 06-26-2012   History of esophagectomy 08-21-2012  AT [facility]   History of kidney stones 2006   History of malignant neoplasm of esophagus ONCOLOGIST-  DR D'AMICO AT [facility]-- PER LAST NOTE 01/ 2018  NO RECURRENCE   dx 10/ 2013  Stage 2A (T2 N0)  08-21-2012 s/p  esophagectomy w/ gastric pull-through [facility]) post residual recurrent anastomotic stricture (multiple EGD w/ dilations)   History of  transient ischemic attack (TIA) 2003   no residual   Hypertension    Long term (current) use of anticoagulants    xarelto   On dofetilide therapy    PAF (paroxysmal atrial fibrillation) ([place]) first dx 2009   primary cardiologist-  dr Daneen Schick /  EP cardiologist -- dr Lurene Shadow ([facility])  s/p  afib ablation 08-02-2016   Pancreatic cyst    x2    Peyronie's disease    Presence of permanent cardiac pacemaker implanted 05-01-2014  at [facility] follows   Pulmonary nodule, right    upper and middle lobes --  per last CT stable   Type 2 diabetes mellitus treated with insulin ([place])    Wears glasses     Past Surgical History:  Procedure Laterality Date   ANTERIOR CERVICAL DECOMP/DISCECTOMY FUSION N/A 06/02/2016   Procedure: ANTERIOR CERVICAL DECOMPRESSION FUSION CERVICAL 5-6, CERVICAL 6-7 WITH INSTRUMENTATION AND ALLOGRAFT;  Surgeon: Phylliss Bob, MD;  Location: [place];  Service: Orthopedics;  Laterality: N/A;  ANTERIOR CERVICAL DECOMPRESSION FUSION CERVICAL 5-6, CERVICAL 6-7 WITH INSTRUMENTATION AND ALLOGRAFT   BIOPSY  06/26/2012   Procedure: BIOPSY;  Surgeon: Madilyn Hook, DO;  Location: [facility];  Service: General;;   CARDIAC ELECTROPHYSIOLOGY STUDY AND ABLATION  08-02-2016   [facility]   (dr Lurene Shadow)   pulmonary veins isolation and ablation afib   CARDIAC PACEMAKER PLACEMENT  05-01-2014   [place]  COLONOSCOPY WITH PROPOFOL N/A 07/13/2015   Procedure: COLONOSCOPY WITH PROPOFOL;  Surgeon: Garlan Fair, MD;  Location: [facility];  Service: Endoscopy;  Laterality: N/A;   ESOPHAGECTOMY  08-21-2012   [facility]   w/ gastric pull-through   ESOPHAGOGASTRODUODENOSCOPY (EGD) WITH ESOPHAGEAL DILATION  multiple x32 per pt approx.--- last one 02-09-2015   recurrent anastomotic stricture post esophagectomy   HIATAL HERNIA REPAIR  06/26/2012   Procedure: LAPAROSCOPIC REPAIR OF HIATAL HERNIA;  Surgeon: Madilyn Hook, DO;  Location: [facility];  Service: General;;    [place]  06/26/2012   Procedure: HERNIA REPAIR INCISIONAL;  Surgeon: Madilyn Hook, DO;  Location: [facility];  Service: General;;   INGUINAL HERNIA REPAIR Bilateral 1987   JEJUNOSTOMY FEEDING TUBE  12/22/2012   Removed 12/ 2016  approx.   KNEE ARTHROSCOPY Left 1990   MASS EXCISION Left 04/04/2013   Procedure: EXCISION OF SCALP MASS;  Surgeon: Madilyn Hook, DO;  Location: [facility];  Service: General;  Laterality: Left;   pilomatrixoma   NESBIT PROCEDURE N/A 12/09/2016   Procedure: NESBIT PROCEDURE 16 DOT PLICATION;  Surgeon: Kathie Rhodes, MD;  Location: [place];  Service: Urology;  Laterality: N/A;   TONSILLECTOMY  1960   TRANSTHORACIC ECHOCARDIOGRAM  08-06-2015   [facility]   mild LVH, ef 50%/  mild RAE /  moderate LAE/  trivial AR, MR, PR,  and TR    Current Medications: Current Meds  Medication Sig   albuterol (VENTOLIN HFA) 108 (90 Base) MCG/ACT inhaler once daily as needed   amLODipine (NORVASC) 5 MG tablet Take 1 tablet (5 mg total) by mouth daily.   atorvastatin (LIPITOR) 40 MG tablet Take 40 mg by mouth daily.   benzonatate (TESSALON) 100 MG capsule Take 2 capsules (200 mg total) by mouth 3 (three) times daily as needed for cough.   busPIRone (BUSPAR) 15 MG tablet Take 15 mg by mouth 2 (two) times daily.   famotidine (PEPCID) 20 MG tablet Take 20 mg by mouth as needed.   FLUoxetine HCl 60 MG TABS Take 1 capsule by mouth daily.   hydrochlorothiazide (MICROZIDE) 12.5 MG capsule Take 12.5 mg by mouth daily.   HYDROcodone bit-homatropine (HYCODAN) 5-1.5 MG/5ML syrup Take 5 mLs by mouth every 6 (six) hours as needed for cough.   insulin NPH Human (NOVOLIN N) 100 UNIT/ML injection 15 Units 2 (two) times daily   LORazepam (ATIVAN) 1 MG tablet Take 1 mg by mouth 2 (two) times daily.    magnesium oxide (MAG-OX) 400 MG tablet Take 400 mg by mouth daily.   metoprolol tartrate (LOPRESSOR) 25 MG tablet TAKE 1 TABLET BY MOUTH TWICE A DAY   pantoprazole (PROTONIX) 40 MG  tablet Take 40 mg by mouth daily.   telmisartan (MICARDIS) 80 MG tablet Take 80 mg by mouth daily.   traMADol (ULTRAM) 50 MG tablet Take 50 mg by mouth 2 (two) times daily as needed for moderate pain.   VALIUM 5 MG tablet Take 5 mg by mouth daily.   vitamin B-12 (CYANOCOBALAMIN) 1000 MCG tablet Take 1,000 mcg by mouth every other day. Taking Mon, Wed, Fri   warfarin (COUMADIN) 5 MG tablet TAKE ONE TABLET BY MOUTH DAILY OR AS DIRECTED BY COUMADIN CLINIC   [DISCONTINUED] amLODipine (NORVASC) 10 MG tablet Take 10 mg by mouth daily.   [DISCONTINUED] isosorbide mononitrate (IMDUR) 30 MG 24 hr tablet TAKE 1 TABLET BY MOUTH EVERY DAY     Allergies:   Patient has no known allergies.  Social History   Socioeconomic History   Marital status: Married    Spouse name: Not on file   Number of children: Not on file   Years of education: Not on file   Highest education level: Not on file  Occupational History   Not on file  Tobacco Use   Smoking status: Never   Smokeless tobacco: Never  Substance and Sexual Activity   Alcohol use: Yes    Comment: socially   Drug use: No   Sexual activity: Not on file  Other Topics Concern   Not on file  Social History Narrative   Not on file   Social Determinants of Health   Financial Resource Strain: Not on file  Food Insecurity: Not on file  Transportation Needs: Not on file  Physical Activity: Not on file  Stress: Not on file  Social Connections: Not on file     Family History: The patient's family history includes Cancer in his father.  ROS:   Please see the history of present illness.    Has incessant coughing in the morning when he first arises.  There is concern that his esophagectomy and intestinal rerouting is causing aspiration.  He is therefore said to have a large surgical procedure with resection of paraesophageal hernia and other maneuvers to improve communication between his posterior pharynx and stomach.  Will be a major  undertaking.  Preoperative evaluation will be done at [facility] in October.  He is not feeling palpitations but does occasionally have his communicator from pacemaker to activate.  All other systems reviewed and are negative.  EKGs/Labs/Other Studies Reviewed:    The following studies were reviewed today: No new data  EKG:  EKG sinus rhythm, 67 bpm, atrial pacing, nonspecific T wave flattening.  No evidence of infarction.  Leftward axis.  No significant change compared to 04/03/2020.  Recent Labs: No results found for requested labs within last 8760 hours.  Recent Lipid Panel    Component Value Date/Time   CHOL 157 04/13/2012 0825   TRIG 103 04/13/2012 0825   HDL 40 04/13/2012 0825   CHOLHDL 3.9 04/13/2012 0825   VLDL 21 04/13/2012 0825   LDLCALC 96 04/13/2012 0825    Physical Exam:    VS:  BP 92/60   Pulse 67   Ht '6\' 1"'$  (1.854 m)   Wt 244 lb 12.8 oz (111 kg)   SpO2 96%   BMI 32.30 kg/m     Wt Readings from Last 3 Encounters:  05/07/21 244 lb 12.8 oz (111 kg)  03/05/21 241 lb 9.6 oz (109.6 kg)  06/04/20 247 lb 3.2 oz (112.1 kg)    Blood pressure initially 92/60.  Blood pressure checked after sitting for 10 minutes was 125/72.  With standing for 1 minute blood pressure is 80/60 mmHg. GEN: Pasty complexion. No acute distress HEENT: Normal NECK: No JVD. LYMPHATICS: No lymphadenopathy CARDIAC: No murmur. RRR no gallop, or edema. VASCULAR:  Normal Pulses. No bruits. RESPIRATORY:  Clear to auscultation without rales, wheezing or rhonchi  ABDOMEN: Soft, non-tender, non-distended, No pulsatile mass, MUSCULOSKELETAL: No deformity  SKIN: Warm and dry NEUROLOGIC:  Alert and oriented x 3 PSYCHIATRIC:  Normal affect   ASSESSMENT:    1. Paroxysmal atrial fibrillation (HCC)   2. Orthostatic hypotension   3. Hypertension, benign   4. Long term (current) use of anticoagulants   5. Hyperlipidemia, unspecified hyperlipidemia type   6. S/P ablation of atrial fibrillation   7. DDD  Pacemaker  PLAN:    In order of problems listed above:  Good control on dofetilide therapy.  Had searched through telemetry data from the pacemaker at [facility].  No mention of any significant burden of atrial fibrillation. Discontinue isosorbide.  Monitor for any chest pain.  Decrease amlodipine to 5 mg/day.  Monitor blood pressures at home.  Except systolic blood pressure as high as 150. Significant orthostasis.  See #2 for medication adjustment.  Encouraged fluid intake. Continue Coumadin therapy.  Okay to discontinue for 5 days prior to colonoscopy.  Resume standard dose per GI approval after the procedure Continue statin therapy with Lipitor 40 mg/day. A. fib control has been excellent. Monitor pacemaker at [facility] with Dr. Jason Coop. Cleared for upcoming colonoscopy.   6 to 53-monthfollow-up with me.   Medication Adjustments/Labs and Tests Ordered: Current medicines are reviewed at length with the patient today.  Concerns regarding medicines are outlined above.  Orders Placed This Encounter  Procedures   EKG 12-Lead   Meds ordered this encounter  Medications   amLODipine (NORVASC) 5 MG tablet    Sig: Take 1 tablet (5 mg total) by mouth daily.    Dispense:  90 tablet    Refill:  3    Dose change     Patient Instructions  Medication Instructions:  1) DISCONTINUE Imdur 2) DECREASE Amlodipine to '5mg'$  once daily  *If you need a refill on your cardiac medications before your next appointment, please call your pharmacy*   Lab Work: None If you have labs (blood work) drawn today and your tests are completely normal, you will receive your results only by: [place](if you have MyChart) OR A paper copy in the mail If you have any lab test that is abnormal or we need to change your treatment, we will call you to review the results.   Testing/Procedures: None   Follow-Up: [facility] you and your health needs are our priority.  As part  of our continuing mission to provide you with exceptional heart care, we have created designated Provider Care Teams.  These Care Teams include your primary Cardiologist (physician) and Advanced Practice Providers (APPs -  Physician Assistants and Nurse Practitioners) who all work together to provide you with the care you need, when you need it.  We recommend signing up for the patient portal called "MyChart".  Sign up information is provided on this After Visit Summary.  MyChart is used to connect with patients for Virtual Visits (Telemedicine).  Patients are able to view lab/test results, encounter notes, upcoming appointments, etc.  Non-urgent messages can be sent to your provider as well.   To learn more about what you can do with MyChart, go to [email or web address]    Your next appointment:   6-9 month(s)  The format for your next appointment:   In Person  Provider:   You may see HSinclair Grooms MD or one of the following Advanced Practice Providers on your designated Care Team:   LCecilie Kicks NP   Other Instructions     Signed, HSinclair Grooms MD  05/07/2021 11:28 AM    [place]

## 2021-05-06 NOTE — Telephone Encounter (Signed)
Patient has an appointment with Dr. Tamala Julian scheduled for tomorrow on 05/07/2021; therefore, pre-op risk assessment can be completed at that time. I will route pre-op form to Dr. Tamala Julian so that he is aware and will add "PREOP EVAL" to appointments notes and then remove from pre-op pool.  I will also go ahead and route note to pharmacy pool so that they can give Coumadin recommendations.  Darreld Mclean, PA-C 05/06/2021 8:29 AM

## 2021-05-06 NOTE — Telephone Encounter (Signed)
Patient with diagnosis of A fib with history of TIA (2003) on warfarin for anticoagulation.    Procedure: colonoscopy Date of procedure: 05/17/21   CHA2DS2-VASc Score = 6  This indicates a 9.7% annual risk of stroke. The patient's score is based upon: CHF History: No HTN History: Yes Diabetes History: Yes Stroke History: Yes Vascular Disease History: Yes Age Score: 1 Gender Score: 0   CrCl 88 mL/min Platelet count  no recent platelet count  Patient has not been bridged before.  Patient held warfarin without Lovenox for 5 days in May for right knee arthroscopy so can likely hold again but will defer decision to Dr Tamala Julian who is seeing patient tomorrow

## 2021-05-07 ENCOUNTER — Ambulatory Visit: Payer: Medicare PPO | Admitting: Interventional Cardiology

## 2021-05-07 ENCOUNTER — Ambulatory Visit (INDEPENDENT_AMBULATORY_CARE_PROVIDER_SITE_OTHER): Payer: Medicare PPO | Admitting: *Deleted

## 2021-05-07 ENCOUNTER — Other Ambulatory Visit: Payer: Self-pay

## 2021-05-07 ENCOUNTER — Encounter: Payer: Self-pay | Admitting: Interventional Cardiology

## 2021-05-07 VITALS — BP 92/60 | HR 67 | Ht 73.0 in | Wt 244.8 lb

## 2021-05-07 DIAGNOSIS — I10 Essential (primary) hypertension: Secondary | ICD-10-CM

## 2021-05-07 DIAGNOSIS — G459 Transient cerebral ischemic attack, unspecified: Secondary | ICD-10-CM

## 2021-05-07 DIAGNOSIS — E785 Hyperlipidemia, unspecified: Secondary | ICD-10-CM

## 2021-05-07 DIAGNOSIS — Z950 Presence of cardiac pacemaker: Secondary | ICD-10-CM | POA: Diagnosis not present

## 2021-05-07 DIAGNOSIS — Z9889 Other specified postprocedural states: Secondary | ICD-10-CM

## 2021-05-07 DIAGNOSIS — Z7901 Long term (current) use of anticoagulants: Secondary | ICD-10-CM

## 2021-05-07 DIAGNOSIS — Z5181 Encounter for therapeutic drug level monitoring: Secondary | ICD-10-CM | POA: Diagnosis not present

## 2021-05-07 DIAGNOSIS — I480 Paroxysmal atrial fibrillation: Secondary | ICD-10-CM

## 2021-05-07 DIAGNOSIS — Z8679 Personal history of other diseases of the circulatory system: Secondary | ICD-10-CM

## 2021-05-07 DIAGNOSIS — I951 Orthostatic hypotension: Secondary | ICD-10-CM | POA: Diagnosis not present

## 2021-05-07 LAB — POCT INR: INR: 2.4 (ref 2.0–3.0)

## 2021-05-07 MED ORDER — AMLODIPINE BESYLATE 5 MG PO TABS
5.0000 mg | ORAL_TABLET | Freq: Every day | ORAL | 3 refills | Status: DC
Start: 2021-05-07 — End: 2021-10-05

## 2021-05-07 NOTE — Patient Instructions (Addendum)
Description   Continue taking the dose you have been taking which is 1 tablet daily. Recheck INR in 1 week after procedure. Coumadin Clinic [phone].      Take your last dose of Warfarin on 05/11/2021. When you resume your Warfarin take an extra 1/2 tablet for 2 days as noted on the calendar.  If the GI doctor delay you resuming Warfarin call us to let us know at [phone]

## 2021-05-07 NOTE — Patient Instructions (Addendum)
Medication Instructions:  1) DISCONTINUE Imdur 2) DECREASE Amlodipine to '5mg'$  once daily  *If you need a refill on your cardiac medications before your next appointment, please call your pharmacy*   Lab Work: None If you have labs (blood work) drawn today and your tests are completely normal, you will receive your results only by: [place] (if you have MyChart) OR A paper copy in the mail If you have any lab test that is abnormal or we need to change your treatment, we will call you to review the results.   Testing/Procedures: None   Follow-Up: At [facility], you and your health needs are our priority.  As part of our continuing mission to provide you with exceptional heart care, we have created designated Provider Care Teams.  These Care Teams include your primary Cardiologist (physician) and Advanced Practice Providers (APPs -  Physician Assistants and Nurse Practitioners) who all work together to provide you with the care you need, when you need it.  We recommend signing up for the patient portal called "MyChart".  Sign up information is provided on this After Visit Summary.  MyChart is used to connect with patients for Virtual Visits (Telemedicine).  Patients are able to view lab/test results, encounter notes, upcoming appointments, etc.  Non-urgent messages can be sent to your provider as well.   To learn more about what you can do with MyChart, go to [email or web address].    Your next appointment:   6-9 month(s)  The format for your next appointment:   In Person  Provider:   You may see [name], MD or one of the following Advanced Practice Providers on your designated Care Team:   Cecilie Kicks, NP   Other Instructions

## 2021-05-07 NOTE — Telephone Encounter (Signed)
   Primary Cardiologist: [name], MD  Chart reviewed as part of pre-operative protocol coverage. Given past medical history and time since last visit, based on ACC/AHA guidelines, Lawrence [name]. would be at acceptable risk for the planned procedure without further cardiovascular testing.   Patient with diagnosis of A fib with history of TIA (2003) on warfarin for anticoagulation.     Procedure: colonoscopy Date of procedure: 05/17/21     CHA2DS2-VASc Score = 6  This indicates a 9.7% annual risk of stroke. The patient's score is based upon: CHF History: No HTN History: Yes Diabetes History: Yes Stroke History: Yes Vascular Disease History: Yes Age Score: 1 Gender Score: 0     CrCl 88 mL/min Platelet count  no recent platelet count   His Coumadin may be held for 5 days prior to his procedure.  Please resume standard dose once hemostasis is achieved at the discretion of surgeon.  I will route this recommendation to the requesting party via Epic fax function and remove from pre-op pool.  Please call with questions.  Jossie Ng. [place] NP-C    05/07/2021, 1:15 PM [place] [address] Office [phone] Fax [phone]

## 2021-05-07 NOTE — Telephone Encounter (Signed)
Per Dr Thompson Caul note today: "Continue Coumadin therapy.  Okay to discontinue for 5 days prior to colonoscopy.  Resume standard dose per GI approval after the procedure."

## 2021-05-13 DIAGNOSIS — Z950 Presence of cardiac pacemaker: Secondary | ICD-10-CM | POA: Diagnosis not present

## 2021-05-17 DIAGNOSIS — D122 Benign neoplasm of ascending colon: Secondary | ICD-10-CM | POA: Diagnosis not present

## 2021-05-17 DIAGNOSIS — K5730 Diverticulosis of large intestine without perforation or abscess without bleeding: Secondary | ICD-10-CM | POA: Diagnosis not present

## 2021-05-17 DIAGNOSIS — K648 Other hemorrhoids: Secondary | ICD-10-CM | POA: Diagnosis not present

## 2021-05-17 DIAGNOSIS — Z86010 Personal history of colonic polyps: Secondary | ICD-10-CM | POA: Diagnosis not present

## 2021-05-17 DIAGNOSIS — D128 Benign neoplasm of rectum: Secondary | ICD-10-CM | POA: Diagnosis not present

## 2021-05-17 DIAGNOSIS — K635 Polyp of colon: Secondary | ICD-10-CM | POA: Diagnosis not present

## 2021-05-17 DIAGNOSIS — D123 Benign neoplasm of transverse colon: Secondary | ICD-10-CM | POA: Diagnosis not present

## 2021-05-17 LAB — HM COLONOSCOPY

## 2021-05-19 DIAGNOSIS — D122 Benign neoplasm of ascending colon: Secondary | ICD-10-CM | POA: Diagnosis not present

## 2021-05-19 DIAGNOSIS — K635 Polyp of colon: Secondary | ICD-10-CM | POA: Diagnosis not present

## 2021-05-19 DIAGNOSIS — D123 Benign neoplasm of transverse colon: Secondary | ICD-10-CM | POA: Diagnosis not present

## 2021-05-24 ENCOUNTER — Ambulatory Visit (INDEPENDENT_AMBULATORY_CARE_PROVIDER_SITE_OTHER): Payer: Medicare PPO | Admitting: *Deleted

## 2021-05-24 ENCOUNTER — Other Ambulatory Visit: Payer: Self-pay

## 2021-05-24 DIAGNOSIS — I480 Paroxysmal atrial fibrillation: Secondary | ICD-10-CM

## 2021-05-24 DIAGNOSIS — Z5181 Encounter for therapeutic drug level monitoring: Secondary | ICD-10-CM

## 2021-05-24 DIAGNOSIS — G459 Transient cerebral ischemic attack, unspecified: Secondary | ICD-10-CM | POA: Diagnosis not present

## 2021-05-24 LAB — POCT INR: INR: 1.5 — AB (ref 2.0–3.0)

## 2021-05-24 NOTE — Patient Instructions (Signed)
Description   Take 1.5 tablets of warfarin today and tomorrow then Continue taking the dose you have been taking which is 1 tablet daily. Recheck INR in 1 week. Coumadin Clinic [phone].

## 2021-05-31 ENCOUNTER — Ambulatory Visit (INDEPENDENT_AMBULATORY_CARE_PROVIDER_SITE_OTHER): Payer: Medicare PPO

## 2021-05-31 ENCOUNTER — Other Ambulatory Visit: Payer: Self-pay

## 2021-05-31 DIAGNOSIS — G459 Transient cerebral ischemic attack, unspecified: Secondary | ICD-10-CM | POA: Diagnosis not present

## 2021-05-31 DIAGNOSIS — I480 Paroxysmal atrial fibrillation: Secondary | ICD-10-CM | POA: Diagnosis not present

## 2021-05-31 DIAGNOSIS — Z5181 Encounter for therapeutic drug level monitoring: Secondary | ICD-10-CM | POA: Diagnosis not present

## 2021-05-31 LAB — POCT INR: INR: 3 (ref 2.0–3.0)

## 2021-05-31 NOTE — Patient Instructions (Signed)
Description   Continue on same dosage of Warfarin 1 tablet daily. Recheck INR in 3 weeks. Coumadin Clinic [phone].

## 2021-06-13 ENCOUNTER — Other Ambulatory Visit: Payer: Self-pay | Admitting: Interventional Cardiology

## 2021-06-21 ENCOUNTER — Ambulatory Visit (INDEPENDENT_AMBULATORY_CARE_PROVIDER_SITE_OTHER): Payer: Medicare PPO | Admitting: *Deleted

## 2021-06-21 ENCOUNTER — Other Ambulatory Visit: Payer: Self-pay

## 2021-06-21 DIAGNOSIS — G459 Transient cerebral ischemic attack, unspecified: Secondary | ICD-10-CM | POA: Diagnosis not present

## 2021-06-21 DIAGNOSIS — I480 Paroxysmal atrial fibrillation: Secondary | ICD-10-CM

## 2021-06-21 DIAGNOSIS — Z5181 Encounter for therapeutic drug level monitoring: Secondary | ICD-10-CM

## 2021-06-21 LAB — POCT INR: INR: 2 (ref 2.0–3.0)

## 2021-06-21 NOTE — Patient Instructions (Addendum)
Description   Today take 1.5 tablets then continue taking Warfarin 1 tablet daily. Recheck INR in 3 weeks. Coumadin Clinic [phone] and fax [phone]

## 2021-06-22 DIAGNOSIS — I2510 Atherosclerotic heart disease of native coronary artery without angina pectoris: Secondary | ICD-10-CM | POA: Diagnosis not present

## 2021-06-22 DIAGNOSIS — Z23 Encounter for immunization: Secondary | ICD-10-CM | POA: Diagnosis not present

## 2021-06-22 DIAGNOSIS — Z01818 Encounter for other preprocedural examination: Secondary | ICD-10-CM | POA: Diagnosis not present

## 2021-06-22 DIAGNOSIS — K449 Diaphragmatic hernia without obstruction or gangrene: Secondary | ICD-10-CM | POA: Diagnosis not present

## 2021-06-22 DIAGNOSIS — K219 Gastro-esophageal reflux disease without esophagitis: Secondary | ICD-10-CM | POA: Diagnosis not present

## 2021-06-22 DIAGNOSIS — R059 Cough, unspecified: Secondary | ICD-10-CM | POA: Diagnosis not present

## 2021-06-22 DIAGNOSIS — E119 Type 2 diabetes mellitus without complications: Secondary | ICD-10-CM | POA: Diagnosis not present

## 2021-06-22 DIAGNOSIS — I10 Essential (primary) hypertension: Secondary | ICD-10-CM | POA: Diagnosis not present

## 2021-06-22 DIAGNOSIS — I4891 Unspecified atrial fibrillation: Secondary | ICD-10-CM | POA: Diagnosis not present

## 2021-06-22 DIAGNOSIS — Z9049 Acquired absence of other specified parts of digestive tract: Secondary | ICD-10-CM | POA: Diagnosis not present

## 2021-06-22 DIAGNOSIS — Z9889 Other specified postprocedural states: Secondary | ICD-10-CM | POA: Diagnosis not present

## 2021-06-22 DIAGNOSIS — C159 Malignant neoplasm of esophagus, unspecified: Secondary | ICD-10-CM | POA: Diagnosis not present

## 2021-06-22 DIAGNOSIS — Z950 Presence of cardiac pacemaker: Secondary | ICD-10-CM | POA: Diagnosis not present

## 2021-06-29 ENCOUNTER — Telehealth: Payer: Self-pay | Admitting: Interventional Cardiology

## 2021-06-29 NOTE — Telephone Encounter (Signed)
Patient with diagnosis of afib on warfarin for anticoagulation.    Procedure: EGD Date of procedure: 07/12/21  CHA2DS2-VASc Score = 6  This indicates a 9.7% annual risk of stroke. The patient's score is based upon: CHF History: 0 HTN History: 1 Diabetes History: 1 Stroke History: 2 Vascular Disease History: 1 Age Score: 1 Gender Score: 0   S/p afib ablation in 2018 with no subsequent afib.  CrCl 46mL/min using adjusted body weight Platelet count 187K  Per office protocol, patient can hold warfarin for 5 days prior to procedure. Patient will not need bridging with Lovenox around procedure per Dr Tamala Julian (previous clearance request 05/05/21 phone note) - has previously held without bridging for multiple other procedures. Called pt to move his INR check (was scheduled during his 5 day holding period), he's aware of anticoag hold before his procedure.

## 2021-06-29 NOTE — Telephone Encounter (Signed)
   [facility] Pre-operative Risk Assessment    Patient Name: Lawrence [name].  DOB: 03/06/1951 MRN: [id number]  HEARTCARE STAFF:  - IMPORTANT!!!!!! Under Visit Info/Reason for Call, type in Other and utilize the format Clearance MM/DD/YY or Clearance TBD. Do not use dashes or single digits. - Please review there is not already an duplicate clearance open for this procedure. - If request is for dental extraction, please clarify the # of teeth to be extracted. - If the patient is currently at the dentist's office, call Pre-Op Callback Staff (MA/nurse) to input urgent request.  - If the patient is not currently in the dentist office, please route to the Pre-Op pool.  Request for surgical clearance:  What type of surgery is being performed?  EGD  When is this surgery scheduled?  07/12/2021  What type of clearance is required (medical clearance vs. Pharmacy clearance to hold med vs. Both)?  PHARMACY  Are there any medications that need to be held prior to surgery and how long?  COUMADIN   Practice name and name of physician performing surgery?  DR. Lerry Paterson / [facility]  What is the office phone number?  [phone]   7.   What is the office fax number?  [phone]  8.   Anesthesia type (None, local, MAC, general) ?  GENERAL   Jeanann Lewandowsky 06/29/2021, 11:13 AM  _________________________________________________________________   (provider comments below)

## 2021-06-29 NOTE — Telephone Encounter (Signed)
Will route to callback to assist. Do not see any duplicate clearances with this. Please route back to P CV DIV PREOP when clearance is entered & ready to address.

## 2021-06-29 NOTE — Telephone Encounter (Signed)
   Patient Name: Lawrence [name].  DOB: [birth year] MRN: [id number]  Primary Cardiologist: [name], MD  Chart reviewed as part of pre-operative protocol coverage. Will route to pharm for finalized input on anticoagulation- see Dr. Thompson Caul OV note 05/07/21 which did comment on colonoscopy clearance. Will plan to call pt after coumadin recommendation known to ensure no new concerns as this appears to be a different procedure/practice.   [name], PA-C 06/29/2021, 12:04 PM

## 2021-06-29 NOTE — Telephone Encounter (Signed)
Lawrence [name] with [facility] states a clearance request was sent to the office on 06/23/21 and he would like to know if it has been received. Please advise.

## 2021-06-29 NOTE — Telephone Encounter (Signed)
   Name: Lawrence [name].  DOB: Jan 01, 1951  MRN: [id number]   Primary Cardiologist: [name], MD  Chart reviewed as part of pre-operative protocol coverage. Patient was contacted 06/29/2021 in reference to pre-operative risk assessment for pending surgery as outlined below.  [name] was last seen on 05/2021. Notes reviewed. Amlodipine decreased but was overall felt to be doing overall well at that visit. At that time he was cleared to proceed with colonoscopy. I reached out to patient for update on how he is doing. He does clarify that the procedure planned is more extensive than EGD - listed as esophagastroduodenoscopy in EMR. He also indicates it will involve possible bronchoscopy as well. The patient affirms he has been doing well without any new cardiac symptoms. He is able to exert over 4 METS without angina or dyspnea. No recent palpitations, dizziness or syncope. BP has improved by review of follow-up notes. Therefore, based on ACC/AHA guidelines, the patient would be at acceptable risk for the planned procedure without further cardiovascular testing. The patient was advised that if he develops new symptoms prior to surgery to contact our office to arrange for a follow-up visit, and he verbalized understanding.  Per pharmD recommendations: "Per office protocol, patient can hold warfarin for 5 days prior to procedure. Patient will not need bridging with Lovenox around procedure per Dr Tamala Julian (previous clearance request 05/05/21 phone note) - has previously held without bridging for multiple other procedures. Called pt to move his INR check (was scheduled during his 5 day holding period), he's aware of anticoag hold before his procedure."  Of note, patient has a pacemaker followed by Dr. Lurene Shadow with [facility], not associated with our practice, so would recommend surgical team inform them of the upcoming procedure as well for any device recommendations if  needed.  I will route this recommendation to the requesting party via Epic fax function and remove from pre-op pool. Please call with questions.  [name], PA-C 06/29/2021, 5:06 PM

## 2021-07-05 DIAGNOSIS — E113293 Type 2 diabetes mellitus with mild nonproliferative diabetic retinopathy without macular edema, bilateral: Secondary | ICD-10-CM | POA: Diagnosis not present

## 2021-07-05 DIAGNOSIS — R053 Chronic cough: Secondary | ICD-10-CM | POA: Diagnosis not present

## 2021-07-05 DIAGNOSIS — K219 Gastro-esophageal reflux disease without esophagitis: Secondary | ICD-10-CM | POA: Diagnosis not present

## 2021-07-05 DIAGNOSIS — I10 Essential (primary) hypertension: Secondary | ICD-10-CM | POA: Diagnosis not present

## 2021-07-06 ENCOUNTER — Other Ambulatory Visit: Payer: Self-pay

## 2021-07-06 ENCOUNTER — Other Ambulatory Visit: Payer: Self-pay | Admitting: Interventional Cardiology

## 2021-07-06 ENCOUNTER — Ambulatory Visit (INDEPENDENT_AMBULATORY_CARE_PROVIDER_SITE_OTHER): Payer: Medicare PPO | Admitting: *Deleted

## 2021-07-06 DIAGNOSIS — Z5181 Encounter for therapeutic drug level monitoring: Secondary | ICD-10-CM

## 2021-07-06 DIAGNOSIS — H43823 Vitreomacular adhesion, bilateral: Secondary | ICD-10-CM | POA: Diagnosis not present

## 2021-07-06 DIAGNOSIS — H35033 Hypertensive retinopathy, bilateral: Secondary | ICD-10-CM | POA: Diagnosis not present

## 2021-07-06 DIAGNOSIS — G459 Transient cerebral ischemic attack, unspecified: Secondary | ICD-10-CM

## 2021-07-06 DIAGNOSIS — I480 Paroxysmal atrial fibrillation: Secondary | ICD-10-CM | POA: Diagnosis not present

## 2021-07-06 DIAGNOSIS — H353132 Nonexudative age-related macular degeneration, bilateral, intermediate dry stage: Secondary | ICD-10-CM | POA: Diagnosis not present

## 2021-07-06 DIAGNOSIS — E113293 Type 2 diabetes mellitus with mild nonproliferative diabetic retinopathy without macular edema, bilateral: Secondary | ICD-10-CM | POA: Diagnosis not present

## 2021-07-06 LAB — POCT INR: INR: 1.8 — AB (ref 2.0–3.0)

## 2021-07-06 NOTE — Patient Instructions (Addendum)
Description   Today take 1.5 tablets then continue taking Warfarin 1 tablet daily. Recheck INR in 1 week after surgery, call once discharged from hospital/rehab facility. [facility] [phone] and fax [phone]      Last dose of  warfarin is tonight then start holding for procedure. Call once discharged from facility.

## 2021-07-08 DIAGNOSIS — K219 Gastro-esophageal reflux disease without esophagitis: Secondary | ICD-10-CM | POA: Diagnosis not present

## 2021-07-08 DIAGNOSIS — C159 Malignant neoplasm of esophagus, unspecified: Secondary | ICD-10-CM | POA: Diagnosis not present

## 2021-07-08 DIAGNOSIS — Z7901 Long term (current) use of anticoagulants: Secondary | ICD-10-CM | POA: Diagnosis not present

## 2021-07-08 DIAGNOSIS — K449 Diaphragmatic hernia without obstruction or gangrene: Secondary | ICD-10-CM | POA: Diagnosis not present

## 2021-07-10 DIAGNOSIS — J811 Chronic pulmonary edema: Secondary | ICD-10-CM | POA: Diagnosis not present

## 2021-07-10 DIAGNOSIS — D899 Disorder involving the immune mechanism, unspecified: Secondary | ICD-10-CM | POA: Diagnosis not present

## 2021-07-10 DIAGNOSIS — D72829 Elevated white blood cell count, unspecified: Secondary | ICD-10-CM | POA: Diagnosis not present

## 2021-07-10 DIAGNOSIS — K660 Peritoneal adhesions (postprocedural) (postinfection): Secondary | ICD-10-CM | POA: Diagnosis not present

## 2021-07-10 DIAGNOSIS — R918 Other nonspecific abnormal finding of lung field: Secondary | ICD-10-CM | POA: Diagnosis not present

## 2021-07-10 DIAGNOSIS — K440 Diaphragmatic hernia with obstruction, without gangrene: Secondary | ICD-10-CM | POA: Diagnosis not present

## 2021-07-10 DIAGNOSIS — E119 Type 2 diabetes mellitus without complications: Secondary | ICD-10-CM | POA: Diagnosis not present

## 2021-07-10 DIAGNOSIS — Z794 Long term (current) use of insulin: Secondary | ICD-10-CM | POA: Diagnosis not present

## 2021-07-10 DIAGNOSIS — J948 Other specified pleural conditions: Secondary | ICD-10-CM | POA: Diagnosis not present

## 2021-07-10 DIAGNOSIS — Z9889 Other specified postprocedural states: Secondary | ICD-10-CM | POA: Diagnosis not present

## 2021-07-10 DIAGNOSIS — J90 Pleural effusion, not elsewhere classified: Secondary | ICD-10-CM | POA: Diagnosis not present

## 2021-07-10 DIAGNOSIS — Z45018 Encounter for adjustment and management of other part of cardiac pacemaker: Secondary | ICD-10-CM | POA: Diagnosis not present

## 2021-07-10 DIAGNOSIS — Z950 Presence of cardiac pacemaker: Secondary | ICD-10-CM | POA: Diagnosis not present

## 2021-07-10 DIAGNOSIS — R571 Hypovolemic shock: Secondary | ICD-10-CM | POA: Diagnosis not present

## 2021-07-10 DIAGNOSIS — E872 Acidosis, unspecified: Secondary | ICD-10-CM | POA: Diagnosis not present

## 2021-07-10 DIAGNOSIS — K449 Diaphragmatic hernia without obstruction or gangrene: Secondary | ICD-10-CM | POA: Diagnosis not present

## 2021-07-10 DIAGNOSIS — K3189 Other diseases of stomach and duodenum: Secondary | ICD-10-CM | POA: Diagnosis not present

## 2021-07-10 DIAGNOSIS — K9189 Other postprocedural complications and disorders of digestive system: Secondary | ICD-10-CM | POA: Diagnosis not present

## 2021-07-10 DIAGNOSIS — J952 Acute pulmonary insufficiency following nonthoracic surgery: Secondary | ICD-10-CM | POA: Diagnosis not present

## 2021-07-10 DIAGNOSIS — E1165 Type 2 diabetes mellitus with hyperglycemia: Secondary | ICD-10-CM | POA: Diagnosis not present

## 2021-07-10 DIAGNOSIS — D689 Coagulation defect, unspecified: Secondary | ICD-10-CM | POA: Diagnosis not present

## 2021-07-10 DIAGNOSIS — C159 Malignant neoplasm of esophagus, unspecified: Secondary | ICD-10-CM | POA: Diagnosis not present

## 2021-07-10 DIAGNOSIS — D62 Acute posthemorrhagic anemia: Secondary | ICD-10-CM | POA: Diagnosis not present

## 2021-07-10 DIAGNOSIS — I4819 Other persistent atrial fibrillation: Secondary | ICD-10-CM | POA: Diagnosis not present

## 2021-07-10 DIAGNOSIS — I959 Hypotension, unspecified: Secondary | ICD-10-CM | POA: Diagnosis not present

## 2021-07-10 DIAGNOSIS — Z9049 Acquired absence of other specified parts of digestive tract: Secondary | ICD-10-CM | POA: Diagnosis not present

## 2021-07-10 DIAGNOSIS — K222 Esophageal obstruction: Secondary | ICD-10-CM | POA: Diagnosis not present

## 2021-07-10 DIAGNOSIS — Z4682 Encounter for fitting and adjustment of non-vascular catheter: Secondary | ICD-10-CM | POA: Diagnosis not present

## 2021-07-10 DIAGNOSIS — I10 Essential (primary) hypertension: Secondary | ICD-10-CM | POA: Diagnosis not present

## 2021-07-10 DIAGNOSIS — Z452 Encounter for adjustment and management of vascular access device: Secondary | ICD-10-CM | POA: Diagnosis not present

## 2021-07-10 DIAGNOSIS — J9811 Atelectasis: Secondary | ICD-10-CM | POA: Diagnosis not present

## 2021-07-10 DIAGNOSIS — G8918 Other acute postprocedural pain: Secondary | ICD-10-CM | POA: Diagnosis not present

## 2021-07-10 DIAGNOSIS — J9601 Acute respiratory failure with hypoxia: Secondary | ICD-10-CM | POA: Diagnosis not present

## 2021-07-12 HISTORY — PX: THORACOTOMY: SUR1349

## 2021-07-12 HISTORY — PX: LAPAROTOMY: SHX154

## 2021-07-13 DIAGNOSIS — D649 Anemia, unspecified: Secondary | ICD-10-CM | POA: Insufficient documentation

## 2021-08-09 ENCOUNTER — Telehealth: Payer: Self-pay | Admitting: Interventional Cardiology

## 2021-08-09 NOTE — Telephone Encounter (Signed)
Spoke with pt and he states this is an issue that he has been dealing with for awhile.  States meds were changed at his last appt for the same reason.  Gets lightheaded when BP drops and legs get weak after standing for a little bit.  Just recently had a 4 week stay at [facility] due to some surgeries that he needed to have.  States they changed all of his medication but didn't have list nearby.  Currently has a chest tube in.  Seeing PCP next week and Dr. Tamala Julian on 12/21.  Advised I will send to Dr. Tamala Julian for review.  Pt also mentioned that the surgeons at [facility] thought it would be a good idea to transition from Coumadin to Eliquis.  Advised I will send to [place] team to discuss as he hasn't had an INR done since he has his surgeries and they may need that first.  He states if INR needed, he would need to plan to do that the morning of his appt with Dr. Tamala Julian because it is hard for him to get transported to appts due to having to bring IV poles and things with him.

## 2021-08-09 NOTE — Telephone Encounter (Signed)
Pt c/o BP issue: STAT if pt c/o blurred vision, one-sided weakness or slurred speech  1. What are your last 5 BP readings? Seated 120/82, standing 100/70, after 1 minute still standing 80/68   2. Are you having any other symptoms (ex. Dizziness, headache, blurred vision, passed out)? No symptoms  3. What is your BP issue? Herbert Deaner, physical therapist calling to report patient's BP. He states the patient also does not have compression hose. He says if there are any questions his number is: [phone]

## 2021-08-09 NOTE — Telephone Encounter (Signed)
Returned call to Clair Gulling, physical therapist with [facility], he states pt does have nursing services with [facility] as well at this time.  Pt has IVs and a chest tube at the present.  Wyatt Haste [phone] gave verbal order for Glenard Haring, RN to check PT/INR at next [facility] visit this week and call results to [phone].  There was no note from 1st visit yesterday documented so she was unsure of exact date they would be going back out on, but will recheck at next [facility] visit. Will route to pharmacy pool to address possibly switching from Warfarin to Eliquis as recommended by the surgeons at [facility].

## 2021-08-10 NOTE — Telephone Encounter (Signed)
[name] is going to check pt's INR at next [facility] visit this week.  Will await results and transition to Eliquis at that time.

## 2021-08-10 NOTE — Telephone Encounter (Signed)
Ok to change to Eliquis. Had previously discussed DOAC with pt in 2020 but he declined due to deductible on his insurance plan of > $400. Looks like he has different insurance this year with [employer] which covers Eliquis better as a Tier 2 med ($40/1 month or cost savings of $80/3 month supply). He qualifies for Eliquis 5mg  BID.

## 2021-08-11 ENCOUNTER — Telehealth: Payer: Self-pay

## 2021-08-11 NOTE — Telephone Encounter (Signed)
Called and spoke with pt's wife to get update of pt's status. Mrs Munyan stated pt was discharged on Friday 08/06/21; however, he still had drains, chest tube, and is currently NPO, receiving injections. I instructed her to call Coumadin clinic with anything changes or concerns and we will continue to follow-up to check on pt.

## 2021-08-12 ENCOUNTER — Ambulatory Visit (INDEPENDENT_AMBULATORY_CARE_PROVIDER_SITE_OTHER): Payer: Medicare PPO | Admitting: *Deleted

## 2021-08-12 DIAGNOSIS — Z5181 Encounter for therapeutic drug level monitoring: Secondary | ICD-10-CM

## 2021-08-12 DIAGNOSIS — I480 Paroxysmal atrial fibrillation: Secondary | ICD-10-CM | POA: Diagnosis not present

## 2021-08-12 DIAGNOSIS — G459 Transient cerebral ischemic attack, unspecified: Secondary | ICD-10-CM

## 2021-08-12 LAB — POCT INR: INR: 1.1 — AB (ref 2.0–3.0)

## 2021-08-12 MED ORDER — APIXABAN 5 MG PO TABS: 5.0000 mg | ORAL_TABLET | Freq: Two times a day (BID) | ORAL | 5 refills | Status: DC

## 2021-08-12 NOTE — Patient Instructions (Signed)
Description   Spoke with Beth RN with [facility] and instructed pt to take Lovenox injection twice a day per your [facility] Surgeon's recommendation & follow up with Dr. Tamala Julian on 08/25/21. Coumadin Clinic [phone] and fax [phone]  Eliquis 5mg  sent & canceled, Warfarin on hold per [facility] Surgeon-NPO, J tube only, pt taking Lovenox only. Follow up after pt sees Dr. Tamala Julian on 08/25/21 for the plan on Anticoagulation.

## 2021-08-12 NOTE — Telephone Encounter (Signed)
New Message:   Lawrence [name] from [place] called.    Pt c/o BP issue: STAT if pt c/o blurred vision, one-sided weakness or slurred speech  1. What are your last 5 BP readings? 08-10-21-  125/88 lying down- sitting 77/60 standing 64/49 08-11-21   lying 117/73, sitting 113/73 and standing 80/56 08-12-21  118/81 lying, 90/64 sitting and standing 61/49 this afternoon it was  130/76 sitting, standing 78/66  2. Are you having any other symptoms (ex. Dizziness, headache, blurred vision, passed out)? Little lightheaded and weak could not stand up hardly  3. What is your BP issue? Blood pressure is running low

## 2021-08-12 NOTE — Telephone Encounter (Signed)
Informed that will have Dr. Tamala Julian review tomorrow when he is back in  the office.  She wanted to make sure we were aware of current pt status.

## 2021-08-13 DIAGNOSIS — R110 Nausea: Secondary | ICD-10-CM | POA: Diagnosis not present

## 2021-08-13 DIAGNOSIS — E113293 Type 2 diabetes mellitus with mild nonproliferative diabetic retinopathy without macular edema, bilateral: Secondary | ICD-10-CM | POA: Diagnosis not present

## 2021-08-13 DIAGNOSIS — R0789 Other chest pain: Secondary | ICD-10-CM | POA: Diagnosis not present

## 2021-08-13 DIAGNOSIS — Z931 Gastrostomy status: Secondary | ICD-10-CM | POA: Diagnosis not present

## 2021-08-13 DIAGNOSIS — I10 Essential (primary) hypertension: Secondary | ICD-10-CM | POA: Diagnosis not present

## 2021-08-13 DIAGNOSIS — R42 Dizziness and giddiness: Secondary | ICD-10-CM | POA: Diagnosis not present

## 2021-08-16 DIAGNOSIS — R42 Dizziness and giddiness: Secondary | ICD-10-CM | POA: Diagnosis not present

## 2021-08-17 DIAGNOSIS — Z8719 Personal history of other diseases of the digestive system: Secondary | ICD-10-CM | POA: Diagnosis not present

## 2021-08-17 DIAGNOSIS — Z9889 Other specified postprocedural states: Secondary | ICD-10-CM | POA: Diagnosis not present

## 2021-08-17 DIAGNOSIS — J90 Pleural effusion, not elsewhere classified: Secondary | ICD-10-CM | POA: Diagnosis not present

## 2021-08-17 DIAGNOSIS — Z4682 Encounter for fitting and adjustment of non-vascular catheter: Secondary | ICD-10-CM | POA: Diagnosis not present

## 2021-08-17 DIAGNOSIS — K449 Diaphragmatic hernia without obstruction or gangrene: Secondary | ICD-10-CM | POA: Diagnosis not present

## 2021-08-17 DIAGNOSIS — Z9049 Acquired absence of other specified parts of digestive tract: Secondary | ICD-10-CM | POA: Diagnosis not present

## 2021-08-17 DIAGNOSIS — C159 Malignant neoplasm of esophagus, unspecified: Secondary | ICD-10-CM | POA: Diagnosis not present

## 2021-08-17 DIAGNOSIS — Z48815 Encounter for surgical aftercare following surgery on the digestive system: Secondary | ICD-10-CM | POA: Diagnosis not present

## 2021-08-17 DIAGNOSIS — J9811 Atelectasis: Secondary | ICD-10-CM | POA: Diagnosis not present

## 2021-08-17 NOTE — Telephone Encounter (Signed)
If he has not already done so, hold Hctz, and increase fluid intake if this can be done without violating instructions from surgeon.  ----- Message -----  From: Loren Racer, RN  Sent: 08/13/2021   9:56 AM EST  To: Belva Crome, MD, Loren Racer, RN   Left message to call back

## 2021-08-17 NOTE — Telephone Encounter (Signed)
Left message to call back  

## 2021-08-17 NOTE — Telephone Encounter (Signed)
Pt is returning call.  

## 2021-08-18 NOTE — Telephone Encounter (Signed)
Spoke with Eustaquio Maize, RN with [facility] and she states pt still having issues with orthostatic hypotension.  PCP stopped Metoprolol last week and started Fludrocortisone.  Nurse did orthostatics today.  States number dropped 5-6 pts from lying to sitting but then went frrom 122/80 sitting to 80/50 standing.  After a few mins it dropped to 74/50.  Pt said he was a little lightheaded.  She mentioned that PT had told her they walked him yesterday and he had similar issues with BP dropping as well.  HR consistently 80s-92.   Called wife and scheduled pt to come in this Friday to see Dr. Acie Fredrickson, DOD for eval.  Wife appreciative for call.

## 2021-08-20 ENCOUNTER — Other Ambulatory Visit: Payer: Self-pay

## 2021-08-20 ENCOUNTER — Encounter: Payer: Self-pay | Admitting: Cardiovascular Disease

## 2021-08-20 ENCOUNTER — Ambulatory Visit: Payer: Medicare PPO | Admitting: Cardiovascular Disease

## 2021-08-20 VITALS — BP 112/78 | HR 157 | Ht 73.0 in | Wt 230.4 lb

## 2021-08-20 DIAGNOSIS — I480 Paroxysmal atrial fibrillation: Secondary | ICD-10-CM

## 2021-08-20 MED ORDER — METOPROLOL TARTRATE 25 MG/10 ML ORAL SUSPENSION: 12.5000 mg | Freq: Three times a day (TID) | ORAL | 3 refills | Status: DC

## 2021-08-20 NOTE — Patient Instructions (Signed)
[place] is free on iphone    Medication Instructions:  Your physician has recommended you make the following change in your medication:  1) RESTART taking metoprolol 12.5 mg three times per day  *If you need a refill on your cardiac medications before your next appointment, please call your pharmacy*  Follow-Up: At [facility], you and your health needs are our priority.  As part of our continuing mission to provide you with exceptional heart care, we have created designated Provider Care Teams.  These Care Teams include your primary Cardiologist (physician) and Advanced Practice Providers (APPs -  Physician Assistants and Nurse Practitioners) who all work together to provide you with the care you need, when you need it.  Your next appointment:   12/21 at 10:20am  The format for your next appointment:   In Person  Provider:   [name], MD

## 2021-08-20 NOTE — Progress Notes (Signed)
Cardiology Office Note:    Date:  08/20/2021   ID:  Lawrence Fudge., DOB 07-31-1951, MRN [id number]  PCP:  [name], MD   [facility] Providers Cardiologist:  [name], MD {, [facility] follows his pacer   Referring MD: [name], MD   Chief Complaint  Patient presents with   Atrial Fibrillation     History of Present Illness:    Lawrence Panik. is a 70 y.o. male with a hx of paroxysmal atrial fibrillation, hypertension, pacemaker implantation  He is recovering from a major surgery at [facility]. Had an esophagectomy in 2013 Had surgery to pull his stomach down from his chest . Had a hernia - the small intestine was up his chest from the hernia His esophageal conduit had become twisted and kinked ( was diagonal instead of verticle )   In the hospital from Nov. 7 - Dec. 3 Has a J tube Also had a pleuridex catheter to drain his pleural effusions .   He has an appt next week with Dr. Tamala Julian. He's not sure why he is here today  His home health nurses have recorded low bp with orthostatic changes.   Is back in atrial fib - HR is 157.   Has been on Lovenox , he has been on coumadin for years [facility] had recommended Eliquis  Previously on xarelto  He was on metoprolol due to hypotension)  Was started on Floronef.  I brought in his blood pressure log.  His blood pressure lying is fairly good.  With standing his blood pressure typically would drop between 60 and 90.  His blood pressures have not really improved despite being on Florinef.  Will restart metoprolol 12.5 TID, because of his rapid atrial fibrillation.  This is a very complex case.  He is got multiple issues.  Ordinarily I would encourage him to eat or drink more and add salt to his diet.  I am not sure that we would be able to do this to his liquid diet that he is injecting into his J-tube.  He will consult with his ostomy advisor at [facility] and see how he would be able to add electrolytes and/or protein to  his diet.  I do not think that he is getting enough fluids, electrolytes, protein.   Past Medical History:  Diagnosis Date   Bradycardia    GERD (gastroesophageal reflux disease)    H/O hiatal hernia    s/p  repair 06-26-2012   History of esophagectomy 08-21-2012  AT [facility]   History of kidney stones 2006   History of malignant neoplasm of esophagus ONCOLOGIST-  DR D'AMICO AT [facility]-- PER LAST NOTE 01/ 2018  NO RECURRENCE   dx 10/ 2013  Stage 2A (T2 N0)  08-21-2012 s/p  esophagectomy w/ gastric pull-through [facility]) post residual recurrent anastomotic stricture (multiple EGD w/ dilations)   History of transient ischemic attack (TIA) 2003   no residual   Hypertension    Long term (current) use of anticoagulants    xarelto   On dofetilide therapy    PAF (paroxysmal atrial fibrillation) ([place]) first dx 2009   primary cardiologist-  dr Daneen Schick /  EP cardiologist -- dr Lurene Shadow ([facility])  s/p  afib ablation 08-02-2016   Pancreatic cyst    x2    Peyronie's disease    Presence of permanent cardiac pacemaker implanted 05-01-2014  at [facility] follows   Pulmonary nodule, right  upper and middle lobes --  per last CT stable   Type 2 diabetes mellitus treated with insulin (HCC)    Wears glasses     Past Surgical History:  Procedure Laterality Date   ANTERIOR CERVICAL DECOMP/DISCECTOMY FUSION N/A 06/02/2016   Procedure: ANTERIOR CERVICAL DECOMPRESSION FUSION CERVICAL 5-6, CERVICAL 6-7 WITH INSTRUMENTATION AND ALLOGRAFT;  Surgeon: Phylliss Bob, MD;  Location: [facility];  Service: Orthopedics;  Laterality: N/A;  ANTERIOR CERVICAL DECOMPRESSION FUSION CERVICAL 5-6, CERVICAL 6-7 WITH INSTRUMENTATION AND ALLOGRAFT   BIOPSY  06/26/2012   Procedure: BIOPSY;  Surgeon: Madilyn Hook, DO;  Location: [facility];  Service: General;;   CARDIAC ELECTROPHYSIOLOGY STUDY AND ABLATION  08-02-2016   [facility]   (dr Lurene Shadow)   pulmonary veins isolation and ablation afib   CARDIAC PACEMAKER  PLACEMENT  05-01-2014   [facility]   CHOLECYSTECTOMY  1988   COLONOSCOPY WITH PROPOFOL N/A 07/13/2015   Procedure: COLONOSCOPY WITH PROPOFOL;  Surgeon: Garlan Fair, MD;  Location: [facility];  Service: Endoscopy;  Laterality: N/A;   ESOPHAGECTOMY  08-21-2012   [facility]   w/ gastric pull-through   ESOPHAGOGASTRODUODENOSCOPY (EGD) WITH ESOPHAGEAL DILATION  multiple x32 per pt approx.--- last one 02-09-2015   recurrent anastomotic stricture post esophagectomy   HIATAL HERNIA REPAIR  06/26/2012   Procedure: LAPAROSCOPIC REPAIR OF HIATAL HERNIA;  Surgeon: Madilyn Hook, DO;  Location: [facility];  Service: General;;   [place]  06/26/2012   Procedure: HERNIA REPAIR INCISIONAL;  Surgeon: Madilyn Hook, DO;  Location: [facility];  Service: General;;   INGUINAL HERNIA REPAIR Bilateral 1987   JEJUNOSTOMY FEEDING TUBE  12/22/2012   Removed 12/ 2016  approx.   KNEE ARTHROSCOPY Left 1990   MASS EXCISION Left 04/04/2013   Procedure: EXCISION OF SCALP MASS;  Surgeon: Madilyn Hook, DO;  Location: [facility];  Service: General;  Laterality: Left;   pilomatrixoma   NESBIT PROCEDURE N/A 12/09/2016   Procedure: NESBIT PROCEDURE 16 DOT PLICATION;  Surgeon: Kathie Rhodes, MD;  Location: [place];  Service: Urology;  Laterality: N/A;   TONSILLECTOMY  1960   TRANSTHORACIC ECHOCARDIOGRAM  08-06-2015   [facility]   mild LVH, ef 50%/  mild RAE /  moderate LAE/  trivial AR, MR, PR,  and TR    Current Medications: Current Meds  Medication Sig   acetaminophen (TYLENOL) 160 MG/5ML suspension Take 20.3 mLs (649.6 mg total) by tube every 6 (six) hours   albuterol (VENTOLIN HFA) 108 (90 Base) MCG/ACT inhaler once daily as needed   enoxaparin (LOVENOX) 120 MG/0.8ML injection Inject into the skin.   fludrocortisone (FLORINEF) 0.1 MG tablet Place into feeding tube daily.   insulin NPH Human (NOVOLIN N) 100 UNIT/ML injection 30 Units at bedtime.   insulin regular (NOVOLIN R) 100 units/mL injection Inject 18 Units  into the skin 3 (three) times daily.   LORazepam (ATIVAN) 1 MG tablet Place 1 mg into feeding tube 2 (two) times daily.   metoprolol tartrate (LOPRESSOR) 25 mg/10 mL SUSP Take 5 mLs (12.5 mg total) by mouth 3 (three) times daily.   VALIUM 5 MG tablet Place 5 mg into feeding tube daily.   vitamin B-12 (CYANOCOBALAMIN) 1000 MCG tablet Place 1,000 mcg into feeding tube every other day. Taking Mon, Wed, Fri     Allergies:   Patient has no known allergies.   Social History   Socioeconomic History   Marital status: Married    Spouse name: Not on file   Number of children: Not  on file   Years of education: Not on file   Highest education level: Not on file  Occupational History   Not on file  Tobacco Use   Smoking status: Never   Smokeless tobacco: Never  Substance and Sexual Activity   Alcohol use: Yes    Comment: socially   Drug use: No   Sexual activity: Not on file  Other Topics Concern   Not on file  Social History Narrative   Not on file   Social Determinants of Health   Financial Resource Strain: Not on file  Food Insecurity: Not on file  Transportation Needs: Not on file  Physical Activity: Not on file  Stress: Not on file  Social Connections: Not on file     Family History: The patient's family history includes Cancer in his father.  ROS:   Please see the history of present illness.     All other systems reviewed and are negative.  EKGs/Labs/Other Studies Reviewed:    The following studies were reviewed today:   EKG: August 20, 2021: Atrial fibrillation with rapid ventricular response.  He has rate related ST and T wave changes.  Recent Labs: No results found for requested labs within last 8760 hours.  Recent Lipid Panel    Component Value Date/Time   CHOL 157 04/13/2012 0825   TRIG 103 04/13/2012 0825   HDL 40 04/13/2012 0825   CHOLHDL 3.9 04/13/2012 0825   VLDL 21 04/13/2012 0825   LDLCALC 96 04/13/2012 0825     Risk Assessment/Calculations:     CHA2DS2-VASc Score = 6   This indicates a 9.7% annual risk of stroke. The patient's score is based upon: CHF History: 0 HTN History: 1 Diabetes History: 1 Stroke History: 2 Vascular Disease History: 1 Age Score: 1 Gender Score: 0          Physical Exam:    VS:  BP 112/78 (BP Location: Left Arm, Patient Position: Sitting, Cuff Size: Normal)    Pulse (!) 157    Ht 6\' 1"  (1.854 m)    Wt 230 lb 6.4 oz (104.5 kg)    SpO2 98%    BMI 30.40 kg/m     Wt Readings from Last 3 Encounters:  08/20/21 230 lb 6.4 oz (104.5 kg)  05/07/21 244 lb 12.8 oz (111 kg)  03/05/21 241 lb 9.6 oz (109.6 kg)     GEN:   elderly, moderately obese male,   J tube in place  HEENT: Normal NECK: No JVD; No carotid bruits LYMPHATICS: No lymphadenopathy CARDIAC: Irreg. Irreg.  Tachy  RESPIRATORY:  Clear to auscultation without rales, wheezing or rhonchi  ABDOMEN: Soft, non-tender, non-distended MUSCULOSKELETAL:  No edema; No deformity  SKIN: Warm and dry NEUROLOGIC:  Alert and oriented x 3 PSYCHIATRIC:  Normal affect   ASSESSMENT:    1. Paroxysmal atrial fibrillation (HCC)    PLAN:      1.  Orthostatic hypotension: The patient presents with orthostatic hypotension.  This was originally recognized by his home health nurses.  His wife has been recording his blood pressure and his blood pressure does fall significantly when he stands up.  He has been on a pure liquid diet through the J-tube since his surgery at [facility] and I suspect that he is not getting enough protein, volume, or electrolytes.  I do not know how to advise him on how to increase this.  They have an ostomy coordinator at [facility] and they will reach out to him to see  what they can do. I hesitate to add any typical sports drink that we would normally drink because I am not sure of the effect of injecting that into the  jejunum.  2.  Rapid atrial fibrillation.  He has known history of paroxysmal atrial fibrillation.  He is in rapid A. fib now.   He is not all that symptomatic but I think that long-term he will start to develop heart failure symptoms.  We need to add back metoprolol 12.5 mg 3 times a day.  He was previously on metoprolol but this was discontinued by his primary medical doctor because of the orthostatic hypotension.  I reviewed his blood pressure log and his blood pressure did not improve at all after stopping the metoprolol.  His medical doctor has also started Florinef.  He has doubled his Florinef recently.  I have encouraged him to get back on the metoprolol since it did not actually affect his blood pressure.  He will be seeing Dr. Tamala Julian next week.  We will add an EKG to that office visit.  I encouraged him to get a Kardia monitor to help monitor his A. fib rate.  His blood pressure cuff clearly is not counting his heart rate accurately.         Medication Adjustments/Labs and Tests Ordered: Current medicines are reviewed at length with the patient today.  Concerns regarding medicines are outlined above.  Orders Placed This Encounter  Procedures   EKG 12-Lead    Meds ordered this encounter  Medications   metoprolol tartrate (LOPRESSOR) 25 mg/10 mL SUSP    Sig: Take 5 mLs (12.5 mg total) by mouth 3 (three) times daily.    Dispense:  1350 mL    Refill:  3     Patient Instructions  [name] mobile  - 79$ [employer]  App is free on iphone    Medication Instructions:  Your physician has recommended you make the following change in your medication:  1) RESTART taking metoprolol 12.5 mg three times per day  *If you need a refill on your cardiac medications before your next appointment, please call your pharmacy*  Follow-Up: At [facility], you and your health needs are our priority.  As part of our continuing mission to provide you with exceptional heart care, we have created designated Provider Care Teams.  These Care Teams include your primary Cardiologist (physician) and Advanced Practice Providers (APPs  -  Physician Assistants and Nurse Practitioners) who all work together to provide you with the care you need, when you need it.  Your next appointment:   12/21 at 10:20am  The format for your next appointment:   In Person  Provider:   [name], MD      Signed, Mertie Moores, MD  08/20/2021 6:08 PM    [place]

## 2021-08-23 ENCOUNTER — Telehealth: Payer: Self-pay | Admitting: Interventional Cardiology

## 2021-08-23 NOTE — Telephone Encounter (Signed)
metoprolol restarted.. bp dropping when standing, 124/80 sitting pulse 88/// standing pt fell down to 78/50 pulse 86 ..pt complaining of weakness in legs, dizziness and headache

## 2021-08-23 NOTE — Telephone Encounter (Signed)
Spoke with Dr. Tamala [name] and he said to have pt decrease Metoprolol to BID until seen on Wednesday.  Spoke with Lawrence Maize, RN with [name].  [name] verbalized understanding and will contact the pt and wife to make them aware.

## 2021-08-24 NOTE — Progress Notes (Signed)
Cardiology Office Note:    Date:  08/25/2021   ID:  Lawrence Fudge., DOB June 20, 1951, MRN [id number]  PCP:  [name], MD  Cardiologist:  [name], MD   Referring MD: [name], MD   Chief Complaint  Patient presents with   Atrial Fibrillation   Follow-up    Orthostasis    History of Present Illness:    Lawrence [name]. is a 70 y.o. male with a hx of atrial fibrillation (Dr.Daubert at [facility]) , dofetilide therapy, chronic anticoagulation, hypertension, TIA, and hyperlipidemia. Prior history of esophagectomy for esophageal cancer 2013.  He had atrial fibrillation at [facility] in 2018 and has had no subsequent atrial fib.  Dofetilide has been discontinued since November 2018.  Had an esophagectomy in 2013 Recently 07/12/21--> 08/07/2021, had surgery to pull his stomach and small intestine down from his chest and repair a hernia. His esophageal conduit had become twisted and kinked ( was diagonal instead of verticle ). Had J-tube.  Since being home has been plagued by orthostasis.  Beta-blocker was resumed by Dr. Acie Fredrickson on 08/20/2021.  Dose was slightly decreased by [place] per following  Feels better.  Feels he is getting stronger every day.  They have been recording his blood pressures.  He remains significantly orthostatic but improved compared to 10 days ago.  He drops to around 90/70 mmHg with standing.  This is an improvement compared to systolic pressures in the 60s.  Past Medical History:  Diagnosis Date   Bradycardia    GERD (gastroesophageal reflux disease)    H/O hiatal hernia    s/p  repair 06-26-2012   History of esophagectomy 08-21-2012  AT [facility]   History of kidney stones 2006   History of malignant neoplasm of esophagus ONCOLOGIST-  DR D'AMICO AT [facility]-- PER LAST NOTE 01/ 2018  NO RECURRENCE   dx 10/ 2013  Stage 2A (T2 N0)  08-21-2012 s/p  esophagectomy w/ gastric pull-through [facility]) post residual  recurrent anastomotic stricture (multiple EGD w/ dilations)   History of transient ischemic attack (TIA) 2003   no residual   Hypertension    Long term (current) use of anticoagulants    xarelto   On dofetilide therapy    PAF (paroxysmal atrial fibrillation) ([place]) first dx 2009   primary cardiologist-  dr Daneen Schick /  EP cardiologist -- dr Lurene Shadow ([facility])  s/p  afib ablation 08-02-2016   Pancreatic cyst    x2    Peyronie's disease    Presence of permanent cardiac pacemaker implanted 05-01-2014  at [facility] follows   Pulmonary nodule, right    upper and middle lobes --  per last CT stable   Type 2 diabetes mellitus treated with insulin ([place])    Wears glasses     Past Surgical History:  Procedure Laterality Date   ANTERIOR CERVICAL DECOMP/DISCECTOMY FUSION N/A 06/02/2016   Procedure: ANTERIOR CERVICAL DECOMPRESSION FUSION CERVICAL 5-6, CERVICAL 6-7 WITH INSTRUMENTATION AND ALLOGRAFT;  Surgeon: Phylliss Bob, MD;  Location: [place];  Service: Orthopedics;  Laterality: N/A;  ANTERIOR CERVICAL DECOMPRESSION FUSION CERVICAL 5-6, CERVICAL 6-7 WITH INSTRUMENTATION AND ALLOGRAFT   BIOPSY  06/26/2012   Procedure: BIOPSY;  Surgeon: Madilyn Hook, DO;  Location: [facility];  Service: General;;   CARDIAC ELECTROPHYSIOLOGY STUDY AND ABLATION  08-02-2016   [facility]   (dr Lurene Shadow)   pulmonary veins isolation and ablation afib   CARDIAC PACEMAKER PLACEMENT  05-01-2014   [facility]   CHOLECYSTECTOMY  1988   COLONOSCOPY WITH PROPOFOL N/A 07/13/2015   Procedure: COLONOSCOPY WITH PROPOFOL;  Surgeon: Garlan Fair, MD;  Location: [facility];  Service: Endoscopy;  Laterality: N/A;   ESOPHAGECTOMY  08-21-2012   [facility]   w/ gastric pull-through   ESOPHAGOGASTRODUODENOSCOPY (EGD) WITH ESOPHAGEAL DILATION  multiple x32 per pt approx.--- last one 02-09-2015   recurrent anastomotic stricture post esophagectomy   HIATAL HERNIA REPAIR  06/26/2012   Procedure: LAPAROSCOPIC REPAIR OF HIATAL HERNIA;   Surgeon: Madilyn Hook, DO;  Location: [facility];  Service: General;;   [place]  06/26/2012   Procedure: HERNIA REPAIR INCISIONAL;  Surgeon: Madilyn Hook, DO;  Location: [facility];  Service: General;;   INGUINAL HERNIA REPAIR Bilateral 1987   JEJUNOSTOMY FEEDING TUBE  12/22/2012   Removed 12/ 2016  approx.   KNEE ARTHROSCOPY Left 1990   MASS EXCISION Left 04/04/2013   Procedure: EXCISION OF SCALP MASS;  Surgeon: Madilyn Hook, DO;  Location: [facility];  Service: General;  Laterality: Left;   pilomatrixoma   NESBIT PROCEDURE N/A 12/09/2016   Procedure: NESBIT PROCEDURE 16 DOT PLICATION;  Surgeon: Kathie Rhodes, MD;  Location: [place];  Service: Urology;  Laterality: N/A;   TONSILLECTOMY  1960   TRANSTHORACIC ECHOCARDIOGRAM  08-06-2015   [facility]   mild LVH, ef 50%/  mild RAE /  moderate LAE/  trivial AR, MR, PR,  and TR    Current Medications: Current Meds  Medication Sig   acetaminophen (TYLENOL) 160 MG/5ML suspension Take 20.3 mLs (649.6 mg total) by tube every 6 (six) hours   atorvastatin (LIPITOR) 40 MG tablet Take 40 mg by mouth daily.   Blood Glucose Monitoring Suppl (ONE TOUCH ULTRA 2) w/Device KIT See admin instructions.   Blood Glucose Monitoring Suppl (TRUE METRIX AIR GLUCOSE METER) w/Device KIT See admin instructions.   enoxaparin (LOVENOX) 120 MG/0.8ML injection Inject into the skin.   fludrocortisone (FLORINEF) 0.1 MG tablet Place into feeding tube daily.   hydrochlorothiazide (MICROZIDE) 12.5 MG capsule Take 12.5 mg by mouth daily.   insulin NPH Human (NOVOLIN N) 100 UNIT/ML injection 30 Units at bedtime.   insulin regular (NOVOLIN R) 100 units/mL injection Inject 18 Units into the skin 3 (three) times daily.   LORazepam (ATIVAN) 1 MG tablet Place 1 mg into feeding tube 2 (two) times daily.   magnesium oxide (MAG-OX) 400 MG tablet Take 400 mg by mouth daily.   metoprolol tartrate (LOPRESSOR) 25 mg/10 mL SUSP Take 5 mLs (12.5 mg total) by mouth 3 (three)  times daily.   polyethylene glycol (MIRALAX) 17 g packet 1 packet daily.     Allergies:   Patient has no known allergies.   Social History   Socioeconomic History   Marital status: Married    Spouse name: Not on file   Number of children: Not on file   Years of education: Not on file   Highest education level: Not on file  Occupational History   Not on file  Tobacco Use   Smoking status: Never   Smokeless tobacco: Never  Substance and Sexual Activity   Alcohol use: Yes    Comment: socially   Drug use: No   Sexual activity: Not on file  Other Topics Concern   Not on file  Social History Narrative   Not on file   Social Determinants of Health   Financial Resource Strain: Not on file  Food Insecurity: Not on file  Transportation Needs: Not on file  Physical Activity: Not on file  Stress: Not on file  Social Connections: Not on file     Family History: The patient's family history includes Cancer in his father.  ROS:   Please see the history of present illness.    Is tolerating metoprolol well.  He can feel his heart racing at times.  This probably has to do with levels of metoprolol in his bloodstream.  All other systems reviewed and are negative.  EKGs/Labs/Other Studies Reviewed:    The following studies were reviewed today: No new data  EKG:  EKG performed on 08/20/2021 demonstrated atrial fibrillation with rapid ventricular response.  Recent Labs: No results found for requested labs within last 8760 hours.  Recent Lipid Panel    Component Value Date/Time   CHOL 157 04/13/2012 0825   TRIG 103 04/13/2012 0825   HDL 40 04/13/2012 0825   CHOLHDL 3.9 04/13/2012 0825   VLDL 21 04/13/2012 0825   LDLCALC 96 04/13/2012 0825    Physical Exam:    VS:  BP 112/72    Pulse 92    Ht _0  (1.854 m)    Wt 230 lb 12.8 oz (104.7 kg)    SpO2 99%    BMI 30.45 kg/m     Wt Readings from Last 3 Encounters:  08/25/21 230 lb 12.8 oz (104.7 kg)  08/20/21 230 lb 6.4 oz  (104.5 kg)  05/07/21 244 lb 12.8 oz (111 kg)     GEN: Overweight. No acute distress HEENT: Normal NECK: No JVD. LYMPHATICS: No lymphadenopathy CARDIAC: No murmur. IIRR no gallop, or edema. VASCULAR:  Normal Pulses. No bruits. RESPIRATORY:  Clear to auscultation without rales, wheezing or rhonchi  ABDOMEN: Soft, non-tender, non-distended, No pulsatile mass, MUSCULOSKELETAL: No deformity  SKIN: Warm and dry NEUROLOGIC:  Alert and oriented x 3 PSYCHIATRIC:  Normal affect   ASSESSMENT:    1. Orthostatic hypotension   2. Paroxysmal atrial fibrillation (HCC)   3. S/P ablation of atrial fibrillation   4. TIA (transient ischemic attack)   5. Hypertension, benign   6. Long term (current) use of anticoagulants   7. Hyperlipidemia, unspecified hyperlipidemia type   8. DDD Pacemaker    PLAN:    In order of problems listed above:  Needs compression stockings.  Increase sodium intake.  Continue current therapy with fludrocortisone 0.1 mg twice daily. We will aim for cardioversion.  Already set up with Dr. Lurene [facility] at [facility] for next week. We did not discuss.  If A. fib quickly returns, may need antiarrhythmic. He is anticoagulated with subcu Lovenox.  Having difficulty with the injections causing pain and swelling. Will use Eliquis once he is able to take p.o. therapy. We did not discuss hyperlipidemia or his pacemaker.     Medication Adjustments/Labs and Tests Ordered: Current medicines are reviewed at length with the patient today.  Concerns regarding medicines are outlined above.  Orders Placed This Encounter  Procedures   EKG 12-Lead   No orders of the defined types were placed in this encounter.   Patient Instructions  Medication Instructions:  Your physician recommends that you continue on your current medications as directed. Please refer to the Current Medication list given to you today.  *If you need a refill on your cardiac medications before your next appointment,  please call your pharmacy*   Lab Work: None ordered  If you have labs (blood work) drawn today and your tests are completely normal, you will receive  your results only by: [place] (if you have MyChart) OR A paper copy in the mail If you have any lab test that is abnormal or we need to change your treatment, we will call you to review the results.   Testing/Procedures: None ordered   Follow-Up: At [facility], you and your health needs are our priority.  As part of our continuing mission to provide you with exceptional heart care, we have created designated Provider Care Teams.  These Care Teams include your primary Cardiologist (physician) and Advanced Practice Providers (APPs -  Physician Assistants and Nurse Practitioners) who all work together to provide you with the care you need, when you need it.  We recommend signing up for the patient portal called "MyChart".  Sign up information is provided on this After Visit Summary.  MyChart is used to connect with patients for Virtual Visits (Telemedicine).  Patients are able to view lab/test results, encounter notes, upcoming appointments, etc.  Non-urgent messages can be sent to your provider as well.   To learn more about what you can do with MyChart, go to [email or web address].    Your next appointment:   3 month(s)  11/16/21 arrive at 7:45  The format for your next appointment:   In Person  Provider:   [name], MD     Other Instructions Get some compression stockings, knee high, and wear them.    Signed, [name], MD  08/25/2021 11:13 AM    [place]

## 2021-08-25 ENCOUNTER — Encounter: Payer: Self-pay | Admitting: Interventional Cardiology

## 2021-08-25 ENCOUNTER — Other Ambulatory Visit: Payer: Self-pay

## 2021-08-25 ENCOUNTER — Ambulatory Visit: Payer: Medicare PPO | Admitting: Interventional Cardiology

## 2021-08-25 VITALS — BP 112/72 | HR 92 | Ht 73.0 in | Wt 230.8 lb

## 2021-08-25 DIAGNOSIS — E785 Hyperlipidemia, unspecified: Secondary | ICD-10-CM | POA: Diagnosis not present

## 2021-08-25 DIAGNOSIS — I10 Essential (primary) hypertension: Secondary | ICD-10-CM

## 2021-08-25 DIAGNOSIS — Z8679 Personal history of other diseases of the circulatory system: Secondary | ICD-10-CM | POA: Diagnosis not present

## 2021-08-25 DIAGNOSIS — Z7901 Long term (current) use of anticoagulants: Secondary | ICD-10-CM

## 2021-08-25 DIAGNOSIS — I480 Paroxysmal atrial fibrillation: Secondary | ICD-10-CM

## 2021-08-25 DIAGNOSIS — Z950 Presence of cardiac pacemaker: Secondary | ICD-10-CM | POA: Diagnosis not present

## 2021-08-25 DIAGNOSIS — I951 Orthostatic hypotension: Secondary | ICD-10-CM

## 2021-08-25 DIAGNOSIS — Z9889 Other specified postprocedural states: Secondary | ICD-10-CM | POA: Diagnosis not present

## 2021-08-25 DIAGNOSIS — G459 Transient cerebral ischemic attack, unspecified: Secondary | ICD-10-CM

## 2021-08-25 NOTE — Patient Instructions (Addendum)
Medication Instructions:  Your physician recommends that you continue on your current medications as directed. Please refer to the Current Medication list given to you today.  *If you need a refill on your cardiac medications before your next appointment, please call your pharmacy*   Lab Work: None ordered  If you have labs (blood work) drawn today and your tests are completely normal, you will receive your results only by: [place] (if you have MyChart) OR A paper copy in the mail If you have any lab test that is abnormal or we need to change your treatment, we will call you to review the results.   Testing/Procedures: None ordered   Follow-Up: At [facility], you and your health needs are our priority.  As part of our continuing mission to provide you with exceptional heart care, we have created designated Provider Care Teams.  These Care Teams include your primary Cardiologist (physician) and Advanced Practice Providers (APPs -  Physician Assistants and Nurse Practitioners) who all work together to provide you with the care you need, when you need it.  We recommend signing up for the patient portal called "MyChart".  Sign up information is provided on this After Visit Summary.  MyChart is used to connect with patients for Virtual Visits (Telemedicine).  Patients are able to view lab/test results, encounter notes, upcoming appointments, etc.  Non-urgent messages can be sent to your provider as well.   To learn more about what you can do with MyChart, go to [email or web address].    Your next appointment:   3 month(s)  11/16/21 arrive at 7:45  The format for your next appointment:   In Person  Provider:   [name], MD     Other Instructions Get some compression stockings, knee high, and wear them.

## 2021-08-31 ENCOUNTER — Telehealth: Payer: Self-pay | Admitting: Pharmacist

## 2021-08-31 MED ORDER — ENOXAPARIN SODIUM 100 MG/ML IJ SOSY: 100.0000 mg | PREFILLED_SYRINGE | Freq: Two times a day (BID) | INTRAMUSCULAR | 0 refills | Status: DC

## 2021-08-31 NOTE — Telephone Encounter (Signed)
Will refill Mr. Lawrence [name] lovenox per wife request. Patient has a J tube, therefore administration of apixaban is not recommended. Could no if G tube or NG tube, but since tube is in small intestine, absorption is unreliable. Pt goes for a swallow test tomorrow. Once cleared to take meds po (even if crushed) he will switch to Eliquis.

## 2021-09-01 ENCOUNTER — Telehealth: Payer: Self-pay | Admitting: Interventional Cardiology

## 2021-09-01 DIAGNOSIS — Z8679 Personal history of other diseases of the circulatory system: Secondary | ICD-10-CM | POA: Diagnosis not present

## 2021-09-01 DIAGNOSIS — I440 Atrioventricular block, first degree: Secondary | ICD-10-CM | POA: Diagnosis not present

## 2021-09-01 DIAGNOSIS — I4819 Other persistent atrial fibrillation: Secondary | ICD-10-CM | POA: Diagnosis not present

## 2021-09-01 DIAGNOSIS — I4891 Unspecified atrial fibrillation: Secondary | ICD-10-CM | POA: Diagnosis not present

## 2021-09-01 DIAGNOSIS — D649 Anemia, unspecified: Secondary | ICD-10-CM | POA: Diagnosis not present

## 2021-09-01 DIAGNOSIS — I480 Paroxysmal atrial fibrillation: Secondary | ICD-10-CM

## 2021-09-01 DIAGNOSIS — R9431 Abnormal electrocardiogram [ECG] [EKG]: Secondary | ICD-10-CM | POA: Diagnosis not present

## 2021-09-01 NOTE — Telephone Encounter (Signed)
Pt c/o medication issue:  1. Name of Medication: enoxaparin (LOVENOX) 100 MG/ML injection  2. How are you currently taking this medication (dosage and times per day)? Inject 1 mL (100 mg total) into the skin every 12 (twelve) hours.   3. Are you having a reaction (difficulty breathing--STAT)? NO  4. What is your medication issue? Pharmacy states that she is going to have to run the medication as a 60 ds in order for the pt to have enough of the medication to take twice daily... pharmacists states that if after 30 days pt will not be continuing the medication everything should be fine but if so pt will run into an issue w/ insurance... please advise

## 2021-09-02 ENCOUNTER — Telehealth: Payer: Self-pay | Admitting: Interventional Cardiology

## 2021-09-02 MED ORDER — ENOXAPARIN SODIUM 100 MG/ML IJ SOSY: 100.0000 mg | PREFILLED_SYRINGE | Freq: Two times a day (BID) | INTRAMUSCULAR | 0 refills | Status: DC

## 2021-09-02 NOTE — Telephone Encounter (Signed)
Pt c/o BP issue: STAT if pt c/o blurred vision, one-sided weakness or slurred speech  1. What are your last 5 BP readings?   12/29: 160/90 (seated-asymptomatic)            120/80 (standing-asymptomatic)  *2 minutes passed and they were unable to get another reading/patient was slightly tired*  110/70 (after walking around the house-asymptomatic)   2. Are you having any other symptoms (ex. Dizziness, headache, blurred vision, passed out)?  No   3. What is your BP issue?   Clair Gulling, PT with [facility] is calling to provide today's BP readings. He also mentioned they are currently working on getting compression stockings for the patient.

## 2021-09-02 NOTE — Telephone Encounter (Signed)
Will refill for a 60 day supply

## 2021-09-02 NOTE — Telephone Encounter (Signed)
Pt seen by Dr. Tamala Julian last week.  Will route to him for review.

## 2021-09-07 ENCOUNTER — Other Ambulatory Visit: Payer: Self-pay | Admitting: Interventional Cardiology

## 2021-09-07 DIAGNOSIS — J90 Pleural effusion, not elsewhere classified: Secondary | ICD-10-CM | POA: Diagnosis not present

## 2021-09-07 DIAGNOSIS — Z9889 Other specified postprocedural states: Secondary | ICD-10-CM | POA: Diagnosis not present

## 2021-09-07 DIAGNOSIS — C159 Malignant neoplasm of esophagus, unspecified: Secondary | ICD-10-CM | POA: Diagnosis not present

## 2021-09-07 DIAGNOSIS — K449 Diaphragmatic hernia without obstruction or gangrene: Secondary | ICD-10-CM | POA: Diagnosis not present

## 2021-09-07 DIAGNOSIS — R918 Other nonspecific abnormal finding of lung field: Secondary | ICD-10-CM | POA: Diagnosis not present

## 2021-09-07 DIAGNOSIS — Z950 Presence of cardiac pacemaker: Secondary | ICD-10-CM | POA: Diagnosis not present

## 2021-09-07 DIAGNOSIS — Z9049 Acquired absence of other specified parts of digestive tract: Secondary | ICD-10-CM | POA: Diagnosis not present

## 2021-09-08 ENCOUNTER — Telehealth: Payer: Self-pay | Admitting: Interventional Cardiology

## 2021-09-08 NOTE — Telephone Encounter (Signed)
Lawrence [name] from [facility] wanted me to leave this message for Dr. Tamala Julian. "Patient is still orthostatic. Patients BP was 122/80 and standing systolic number was below 90. Patient became very weak and had to sit down."

## 2021-09-08 NOTE — Telephone Encounter (Signed)
See phone note from 09/08/21.  RN from home health called in about BPs and was directed to call [facility].

## 2021-09-08 NOTE — Telephone Encounter (Signed)
Pt still having symptoms with orthostatic BPs.  Clair Gulling from [facility] called last week and said they were working on getting compression stockings for him but he is not using them yet.  Spoke with Beth, RN and she said pt is refusing to wear the compression stockings because they are too tight.  She explained to him the importance of wearing them to help with BP and he still declined.  She states pt was told to go back to taking Metoprolol TID but I am unable to locate who told him to do that.  She mentioned that pt recently had DCCV.  Upon investigation, pt had DCCV at [facility] on 09/01/21.  She was asking about going back down to BID on Metoprolol because BPs were better.  Advised that she reach out to [facility] facility since they just recently seen him and did DCCV and Metoprolol is to help with rate control.  Eustaquio Maize is agreeable to plan.

## 2021-09-10 DIAGNOSIS — Z950 Presence of cardiac pacemaker: Secondary | ICD-10-CM | POA: Diagnosis not present

## 2021-09-10 DIAGNOSIS — I4891 Unspecified atrial fibrillation: Secondary | ICD-10-CM | POA: Diagnosis not present

## 2021-09-10 DIAGNOSIS — Z9049 Acquired absence of other specified parts of digestive tract: Secondary | ICD-10-CM | POA: Diagnosis not present

## 2021-09-10 DIAGNOSIS — I10 Essential (primary) hypertension: Secondary | ICD-10-CM | POA: Diagnosis not present

## 2021-09-10 DIAGNOSIS — K219 Gastro-esophageal reflux disease without esophagitis: Secondary | ICD-10-CM | POA: Diagnosis not present

## 2021-09-10 DIAGNOSIS — E119 Type 2 diabetes mellitus without complications: Secondary | ICD-10-CM | POA: Diagnosis not present

## 2021-09-10 DIAGNOSIS — Z8673 Personal history of transient ischemic attack (TIA), and cerebral infarction without residual deficits: Secondary | ICD-10-CM | POA: Diagnosis not present

## 2021-09-10 DIAGNOSIS — Z85038 Personal history of other malignant neoplasm of large intestine: Secondary | ICD-10-CM | POA: Diagnosis not present

## 2021-09-10 DIAGNOSIS — Z794 Long term (current) use of insulin: Secondary | ICD-10-CM | POA: Diagnosis not present

## 2021-09-13 ENCOUNTER — Telehealth: Payer: Self-pay | Admitting: Interventional Cardiology

## 2021-09-13 NOTE — Telephone Encounter (Signed)
Called pt on both phone numbers listed in chart.  Left detailed message with appt date and time for tomorrow.

## 2021-09-13 NOTE — Telephone Encounter (Signed)
Left message to call back  

## 2021-09-13 NOTE — Telephone Encounter (Signed)
New message    Patient walked in to make appt with Dr Tamala Julian.  He states that he had a cardioversion on 09-07-21 at [facility].  His bp is extremely low when standing---92/75 and as low at 70ish/50ish.  Patient states that he takes metroprolol tid via a J-tube.  I offered pt an appt with the APP for tomorrow but wanted to see what Dr Tamala Julian said.  Please advise.

## 2021-09-14 ENCOUNTER — Ambulatory Visit: Payer: Medicare PPO | Admitting: Physician Assistant

## 2021-09-14 ENCOUNTER — Other Ambulatory Visit: Payer: Self-pay

## 2021-09-14 ENCOUNTER — Encounter: Payer: Self-pay | Admitting: Physician Assistant

## 2021-09-14 VITALS — BP 114/82 | HR 89 | Ht 73.0 in | Wt 235.2 lb

## 2021-09-14 DIAGNOSIS — N400 Enlarged prostate without lower urinary tract symptoms: Secondary | ICD-10-CM | POA: Diagnosis not present

## 2021-09-14 DIAGNOSIS — E785 Hyperlipidemia, unspecified: Secondary | ICD-10-CM

## 2021-09-14 DIAGNOSIS — I2510 Atherosclerotic heart disease of native coronary artery without angina pectoris: Secondary | ICD-10-CM

## 2021-09-14 DIAGNOSIS — I951 Orthostatic hypotension: Secondary | ICD-10-CM | POA: Diagnosis not present

## 2021-09-14 DIAGNOSIS — Z950 Presence of cardiac pacemaker: Secondary | ICD-10-CM | POA: Diagnosis not present

## 2021-09-14 DIAGNOSIS — F325 Major depressive disorder, single episode, in full remission: Secondary | ICD-10-CM | POA: Diagnosis not present

## 2021-09-14 DIAGNOSIS — G459 Transient cerebral ischemic attack, unspecified: Secondary | ICD-10-CM

## 2021-09-14 DIAGNOSIS — E782 Mixed hyperlipidemia: Secondary | ICD-10-CM | POA: Diagnosis not present

## 2021-09-14 DIAGNOSIS — E1142 Type 2 diabetes mellitus with diabetic polyneuropathy: Secondary | ICD-10-CM | POA: Diagnosis not present

## 2021-09-14 DIAGNOSIS — K219 Gastro-esophageal reflux disease without esophagitis: Secondary | ICD-10-CM | POA: Diagnosis not present

## 2021-09-14 DIAGNOSIS — I480 Paroxysmal atrial fibrillation: Secondary | ICD-10-CM | POA: Diagnosis not present

## 2021-09-14 DIAGNOSIS — G4700 Insomnia, unspecified: Secondary | ICD-10-CM | POA: Diagnosis not present

## 2021-09-14 DIAGNOSIS — I4891 Unspecified atrial fibrillation: Secondary | ICD-10-CM | POA: Diagnosis not present

## 2021-09-14 DIAGNOSIS — I10 Essential (primary) hypertension: Secondary | ICD-10-CM | POA: Diagnosis not present

## 2021-09-14 MED ORDER — METOPROLOL TARTRATE 25 MG/10 ML ORAL SUSPENSION: 12.5000 mg | Freq: Two times a day (BID) | ORAL | 3 refills | Status: DC

## 2021-09-14 MED ORDER — METOPROLOL TARTRATE 25 MG PO TABS: 12.5000 mg | ORAL_TABLET | Freq: Two times a day (BID) | ORAL | 3 refills | Status: DC

## 2021-09-14 MED ORDER — FLUDROCORTISONE ACETATE 0.1 MG PO TABS: 0.1000 mg | ORAL_TABLET | Freq: Two times a day (BID) | ORAL | 3 refills | Status: AC

## 2021-09-14 NOTE — Patient Instructions (Addendum)
Medication Instructions:  Your physician has recommended you make the following change in your medication:  DECREASE: Metoprolol to 12.5mg  twice daily INCREASE: Florinef to 0.1mg  twice daily  *If you need a refill on your cardiac medications before your next appointment, please call your pharmacy*   Lab Work: None If you have labs (blood work) drawn today and your tests are completely normal, you will receive your results only by: [place] (if you have MyChart) OR A paper copy in the mail If you have any lab test that is abnormal or we need to change your treatment, we will call you to review the results.   Follow-Up: At [facility], you and your health needs are our priority.  As part of our continuing mission to provide you with exceptional heart care, we have created designated Provider Care Teams.  These Care Teams include your primary Cardiologist (physician) and Advanced Practice Providers (APPs -  Physician Assistants and Nurse Practitioners) who all work together to provide you with the care you need, when you need it.   Your next appointment:   As scheduled  Other Instructions Contact your GI Dr regarding your nutrition

## 2021-09-14 NOTE — Progress Notes (Signed)
Cardiology Office Note    Date:  09/14/2021   ID:  [name], DOB [birth year], MRN [id number]   PCP:  [name], MD   [place]  Cardiologist:  [name], MD   Advanced Practice Provider:  No care team member to display Electrophysiologist:  None   [phone]   Chief Complaint  Patient presents with   Dizziness    History of Present Illness:  Yousuf Ager. is a 71 y.o. male with a hx of atrial fibrillation (Dr.Daubert at [facility]) , dofetilide therapy, chronic anticoagulation, hypertension, TIA, and hyperlipidemia. Prior history of esophagectomy for esophageal cancer 2013.  He had atrial fibrillation at [facility] in 2018 and has had no subsequent atrial fib.  Dofetilide has been discontinued since November 2018.   Had an esophagectomy in 2013 Recently 07/12/21--> 08/07/2021, had surgery to pull his stomach and small intestine down from his chest and repair a hernia. His esophageal conduit had become twisted and kinked ( was diagonal instead of verticle ). Had J-tube.   He saw Dr. Tamala Julian 08/25/21 and was having trouble with hypotension. Beta blocker resumed by Dr. Acie Fredrickson 08/20/21 at lower dose. He was significantly hypotensive but somewhat improved. He's refused to wear compression stockings was started on fludrocortisone 0.1 mg bid and set up for DCCV at [place] done 09/01/21 on lovenox with plans to take eliquis once he can take po therapy.   Patient comes in with his wife.  He still has significant orthostatic hypotension.  When he first went home his wife was given a lot of protein packs but she no longer has these.  He just started taking clear liquids 1 week ago and is taking Eliquis by mouth.  Still being fed through the PEG.    Past Medical History:  Diagnosis Date   Bradycardia    GERD (gastroesophageal reflux disease)    H/O hiatal hernia    s/p  repair 06-26-2012   History of  esophagectomy 08-21-2012  AT [facility]   History of kidney stones 2006   History of malignant neoplasm of esophagus ONCOLOGIST-  DR D'AMICO AT [facility]-- PER LAST NOTE 01/ 2018  NO RECURRENCE   dx 10/ 2013  Stage 2A (T2 N0)  08-21-2012 s/p  esophagectomy w/ gastric pull-through [facility]) post residual recurrent anastomotic stricture (multiple EGD w/ dilations)   History of transient ischemic attack (TIA) 2003   no residual   Hypertension    Long term (current) use of anticoagulants    xarelto   On dofetilide therapy    PAF (paroxysmal atrial fibrillation) ([place]) first dx 2009   primary cardiologist-  dr Daneen Schick /  EP cardiologist -- dr Lurene Shadow ([facility])  s/p  afib ablation 08-02-2016   Pancreatic cyst    x2    Peyronie's disease    Presence of permanent cardiac pacemaker implanted 05-01-2014  at [facility] follows   Pulmonary nodule, right    upper and middle lobes --  per last CT stable   Type 2 diabetes mellitus treated with insulin ([place])    Wears glasses     Past Surgical History:  Procedure Laterality Date   ANTERIOR CERVICAL DECOMP/DISCECTOMY FUSION N/A 06/02/2016   Procedure: ANTERIOR CERVICAL DECOMPRESSION FUSION CERVICAL 5-6, CERVICAL 6-7 WITH INSTRUMENTATION AND ALLOGRAFT;  Surgeon: Phylliss Bob, MD;  Location: [place];  Service: Orthopedics;  Laterality: N/A;  ANTERIOR CERVICAL DECOMPRESSION FUSION  CERVICAL 5-6, CERVICAL 6-7 WITH INSTRUMENTATION AND ALLOGRAFT   BIOPSY  06/26/2012   Procedure: BIOPSY;  Surgeon: Madilyn Hook, DO;  Location: [facility];  Service: General;;   CARDIAC ELECTROPHYSIOLOGY STUDY AND ABLATION  08-02-2016   [facility]   (dr Lurene Shadow)   pulmonary veins isolation and ablation afib   CARDIAC PACEMAKER PLACEMENT  05-01-2014   [facility]   CHOLECYSTECTOMY  1988   COLONOSCOPY WITH PROPOFOL N/A 07/13/2015   Procedure: COLONOSCOPY WITH PROPOFOL;  Surgeon: Garlan Fair, MD;  Location: [facility];  Service: Endoscopy;  Laterality: N/A;   ESOPHAGECTOMY   08-21-2012   [facility]   w/ gastric pull-through   ESOPHAGOGASTRODUODENOSCOPY (EGD) WITH ESOPHAGEAL DILATION  multiple x32 per pt approx.--- last one 02-09-2015   recurrent anastomotic stricture post esophagectomy   HIATAL HERNIA REPAIR  06/26/2012   Procedure: LAPAROSCOPIC REPAIR OF HIATAL HERNIA;  Surgeon: Madilyn Hook, DO;  Location: [facility];  Service: General;;   [place]  06/26/2012   Procedure: HERNIA REPAIR INCISIONAL;  Surgeon: Madilyn Hook, DO;  [facility]: [facility];  Service: General;;   INGUINAL HERNIA REPAIR Bilateral 1987   JEJUNOSTOMY FEEDING TUBE  12/22/2012   Removed 12/ 2016  approx.   KNEE ARTHROSCOPY Left 1990   MASS EXCISION Left 04/04/2013   Procedure: EXCISION OF SCALP MASS;  Surgeon: Madilyn Hook, DO;  [facility]: [facility];  Service: General;  Laterality: Left;   pilomatrixoma   NESBIT PROCEDURE N/A 12/09/2016   Procedure: NESBIT PROCEDURE 16 DOT PLICATION;  Surgeon: Kathie Rhodes, MD;  Location: [place];  Service: Urology;  Laterality: N/A;   TONSILLECTOMY  1960   TRANSTHORACIC ECHOCARDIOGRAM  08-06-2015   [facility]   mild LVH, ef 50%/  mild RAE /  moderate LAE/  trivial AR, MR, PR,  and TR    Current Medications: Current Meds  Medication Sig   acetaminophen (TYLENOL) 160 MG/5ML suspension Take 20.3 mLs (649.6 mg total) by tube every 6 (six) hours   albuterol (VENTOLIN HFA) 108 (90 Base) MCG/ACT inhaler    apixaban (ELIQUIS) 5 MG TABS tablet Take 5 mg by mouth 2 (two) times daily.   Blood Glucose Monitoring Suppl (ONE TOUCH ULTRA 2) w/Device KIT See admin instructions.   Blood Glucose Monitoring Suppl (TRUE METRIX AIR GLUCOSE METER) w/Device KIT See admin instructions.   insulin NPH Human (NOVOLIN N) 100 UNIT/ML injection 30 Units at bedtime.   insulin regular (NOVOLIN R) 100 units/mL injection Inject 18 Units into the skin 3 (three) times daily.   Insulin Syringe-Needle U-100 (BD VEO INSULIN SYRINGE U/F) 31G X 15/64" 0.3 ML MISC in the morning, at  noon, in the evening, and at bedtime.   LORazepam (ATIVAN) 1 MG tablet Place 1 mg into feeding tube 2 (two) times daily.   metoprolol tartrate (LOPRESSOR) 25 MG tablet Take 0.5 tablets (12.5 mg total) by mouth 2 (two) times daily.   OMEPRAZOLE PO 1 Dose by Other route daily.   ondansetron (ZOFRAN-ODT) 4 MG disintegrating tablet 1 tablet. 1 tablet on the tongue and allow to dissolve   Polyethyl Glycol-Propyl Glycol 0.4-0.3 % SOLN 1 drop at bedtime as needed. 1 drop at bedtime as needed.   polyethylene glycol (MIRALAX / GLYCOLAX) 17 g packet 1 packet daily.   telmisartan (MICARDIS) 80 MG tablet Take 80 mg by mouth daily.   traMADol (ULTRAM) 50 MG tablet Take 50 mg by mouth 2 (two) times daily as needed for moderate pain.   vitamin B-12 (CYANOCOBALAMIN) 1000 MCG tablet  Place 1,000 mcg into feeding tube every other day. Taking Mon, Wed, Fri   [DISCONTINUED] fludrocortisone (FLORINEF) 0.1 MG tablet Place into feeding tube daily.   [DISCONTINUED] metoprolol tartrate (LOPRESSOR) 25 mg/10 mL SUSP Take 5 mLs (12.5 mg total) by mouth 3 (three) times daily.     Allergies:   Patient has no known allergies.   Social History   Socioeconomic History   Marital status: Married    Spouse name: Not on file   Number of children: Not on file   Years of education: Not on file   Highest education level: Not on file  Occupational History   Not on file  Tobacco Use   Smoking status: Never   Smokeless tobacco: Never  Substance and Sexual Activity   Alcohol use: Yes    Comment: socially   Drug use: No   Sexual activity: Not on file  Other Topics Concern   Not on file  Social History Narrative   Not on file   Social Determinants of Health   Financial Resource Strain: Not on file  Food Insecurity: Not on file  Transportation Needs: Not on file  Physical Activity: Not on file  Stress: Not on file  Social Connections: Not on file     Family History:  The patient's  family history includes Cancer in  his father.   ROS:   Please see the history of present illness.    ROS All other systems reviewed and are negative.   PHYSICAL EXAM:   VS:  BP 114/82    Pulse 89    Ht _0  (1.854 m)    Wt 235 lb 3.2 oz (106.7 kg)    SpO2 98%    BMI 31.03 kg/m   Physical Exam  GEN: Obese, in no acute distress  Neck: no JVD, carotid bruits, or masses Cardiac:RRR; no murmurs, rubs, or gallops  Respiratory:  clear to auscultation bilaterally, normal work of breathing GI: soft, nontender, nondistended, + BS, PEG in place Ext: without cyanosis, clubbing, or edema, Good distal pulses bilaterally Neuro:  Alert and Oriented x 3 Psych: euthymic mood, full affect  Wt Readings from Last 3 Encounters:  09/14/21 235 lb 3.2 oz (106.7 kg)  08/25/21 230 lb 12.8 oz (104.7 kg)  08/20/21 230 lb 6.4 oz (104.5 kg)      Studies/Labs Reviewed:   EKG:  EKG is  ordered today.  The ekg ordered today demonstrates normal sinus rhythm at 89 bpm nonspecific ST-T wave changes, no acute change  Recent Labs: No results found for requested labs within last 8760 hours.   Lipid Panel    Component Value Date/Time   CHOL 157 04/13/2012 0825   TRIG 103 04/13/2012 0825   HDL 40 04/13/2012 0825   CHOLHDL 3.9 04/13/2012 0825   VLDL 21 04/13/2012 0825   LDLCALC 96 04/13/2012 0825    Additional studies/ records that were reviewed today include:   Coronary CTA 05/2019 FINDINGS: Coronary calcium score: The patient's coronary artery calcium score is 357, which places the patient in the 73rd percentile.   Coronary arteries: Normal coronary origins.  Right dominance.   Right Coronary Artery: Dominant vessel. There is a mild 25-49% (CADRADS 2) mixed plaque of the proximal RCA. There is another mild 24-49% (CADRADS 2) non-calcified plaque of the distal RCA.   Left Main Coronary Artery: Calcification of the LCC near the ostium of the left main. No significant left main stenosis.   Left Anterior Descending Coronary Artery:  Calcified proximally with a mild 25-49% (CADRADS2) mixed plaque of the proximal to mid LAD just prior to the D1 bifurcation. The distal LAD tapers to a small vessel and is not well-visualized. The moderate-sized D1 branch is calcified proximally with a minimal 1-24% (CADRADS1) stenosis.   Ramus Intermedius Artery: Small branch (<2.0 mm) which is calcified proximally and bifurcates in the mid-vessel. There is a suspected 50-69% (CADRADS3) mixed stenosis in the mid-vessel just prior to the bifurcation. Given the small vessel size, it is not likely amenable to intervention.   Left Circumflex Artery: Calcified proximally with moderate mixed 50-69% stenosis (CADRADS3). There is a moderate-sized OM branch which is calcified proximally and has diffuse mild 24-49% proximal stenosis (CADRADS2).   Aorta: Normal size, 37 mm at the mid ascending aorta (level of the PA bifurcation) measured double oblique. No calcifications. No dissection.   Aortic Valve: Trileaflet valve with calcification of the leaflets (particularly the right coronary cusp) and annulus.   Other findings:   Normal pulmonary vein drainage into the left atrium. No evidence for pulmonary vein stenosis given prior afib ablation.   Normal left atrial appendage without a thrombus.   Dilated pulmonary artery measuring 32 mm at the level of the PA bifurcation.   Dual chamber pacemaker with lead tips in the RA appendage and RV apex.   IMPRESSION: 1. Multivessel non-obstructive CAD, with mild non-obstructive mixed plaque of the the LAD, RCA and OM1 and moderate mixed stenosis of a small Ramus branch, not amenable to intervention. CADRADS 2/3.   2. Coronary calcium score of 357. This was 73rd percentile for age and sex matched control.   3. Normal coronary origin with right dominance.   4. Trileaflet aortic valve with calcification of the right coronary cusp and annulus.   5. Dilated main PA measuring 32 mm.   6. Dual  chamber pacemaker noted with lead tips in the expected position     Electronically Signed   By: Pixie Casino M.D.   On: 05/09/2019 21:10     Echo 04/2019 IMPRESSIONS     1. The left ventricle has normal systolic function, with an ejection  fraction of 55-60%. The cavity size was normal. There is moderate  asymmetric left ventricular hypertrophy. Left ventricular diastolic  Doppler parameters are consistent with  pseudonormalization.   2. The right ventricle has normal systolic function. The cavity was  normal. There is no increase in right ventricular wall thickness.   3. The aortic valve is abnormal. Moderate thickening of the aortic valve.  Mild calcification of the aortic valve.   4. The aorta is normal unless otherwise noted.   5. The aortic root and ascending aorta are normal in size and structure.   6. RA pacing wire present.   7. The atrial septum is grossly normal.   8. The average left ventricular global longitudinal strain is -18.1 %.   Risk Assessment/Calculations:    CHA2DS2-VASc Score = 6   This indicates a 9.7% annual risk of stroke. The patient's score is based upon: CHF History: 0 HTN History: 1 Diabetes History: 1 Stroke History: 2 Vascular Disease History: 1 Age Score: 1 Gender Score: 0        ASSESSMENT:    1. Orthostatic hypotension   2. Paroxysmal atrial fibrillation (HCC)   3. TIA (transient ischemic attack)   4. Coronary artery disease involving native coronary artery of native heart without angina pectoris   5. Hyperlipidemia, unspecified hyperlipidemia type   6. DDD Pacemaker  PLAN:  In order of problems listed above:  Orthostatic hypotension has been a problem since his complicated surgery.  We will reduce metoprolol to 12.5 mg twice daily.  He may need lower dose and even have to have this stopped.  Heart rate remains in the high 80s low 90s here and at home.  Currently he is getting metoprolol compounded at a [facility]  so we could potentially go down to 6.25 mg twice daily if it is compounded.  Metoprolol tablets can be crushed per pharmacy and put through PEG if he is unable to swallow them.  He continues to wear compression hose.  I do feel he is a low dehydrated.  He is only allowed to drink small amounts to make sure there is not leakage of his recent abdominal surgery.  His wife will contact the surgeon to see if his nutrition needs to be adjusted  PAF S/P [facility] 09/01/21 was on lovenox now on eliquis since last week. History of ablation of Afib.  He is in normal sinus rhythm today.  Heart rates remain in the low 90s.  History of TIA now on Eliquis  CAD nonobstructive on CTA 05/2019-Ca score 357  HLD not on Lipitor at this time because of major GI surgery.  Once he is taking oral regularly he can get back on this.  DDD Pacemaker followed at [facility] Making/Informed Consent        Medication Adjustments/Labs and Tests Ordered: Current medicines are reviewed at length with the patient today.  Concerns regarding medicines are outlined above.  Medication changes, Labs and Tests ordered today are listed in the Patient Instructions below. Patient Instructions  Medication Instructions:  Your physician has recommended you make the following change in your medication:  DECREASE: Metoprolol to 12.37m twice daily INCREASE: Florinef to 0.122mtwice daily  *If you need a refill on your cardiac medications before your next appointment, please call your pharmacy*   Lab Work: None If you have labs (blood work) drawn today and your tests are completely normal, you will receive your results only by: [facility] you have MyChart) OR A paper copy in the mail If you have any lab test that is abnormal or we need to change your treatment, we will call you to review the results.   Follow-Up: [facility] our [facility].  As part of our continuing mission  to provide you with exceptional heart care, we have created designated Provider Care Teams.  These Care Teams include your primary Cardiologist (physician) and Advanced Practice Providers (APPs -  Physician Assistants and Nurse Practitioners) who all work together to provide you with the care you need, when you need it.   Your next appointment:   As scheduled  Other Instructions Contact your GI Dr regarding your nutrition     Signed, MiErmalinda BarriosPA-C  09/14/2021 9:55 AM    [place]  [id number]: [phone]: [phone]

## 2021-09-28 ENCOUNTER — Telehealth: Payer: Self-pay | Admitting: Interventional Cardiology

## 2021-09-28 NOTE — Telephone Encounter (Signed)
Spoke with Eustaquio Maize, RN with [facility].  She said pt is not wearing compression stockings as recommended by our office on multiple occasions.  She states he said he just doesn't want to wear them.  Pt complained of dizziness and lightheadedness to her and he was noted to be orthostatic.  Advised he really needs to wear the compression stockings because we don't have much, if any, wiggle room in his meds now due to trying to control his fluid level and AF as well.  She will talk to pt again about compression stockings.  Will route to Dr. Tamala Julian for review.

## 2021-09-28 NOTE — Telephone Encounter (Signed)
Beth from [facility] called to report she saw patient yesterday.  Patient reported feeling dizzy and lightheaded, do to orthostatic BP.  BP' were in the 140's-150's/80's-90's sitting, upon standing systolic it drop to 48'G-89'V.  He also complains of his heart fluttering.  His heart was regular at 80 when she first got there, then he complained of his heart fluttering she checked it again and it was 80 but irregular.

## 2021-10-01 ENCOUNTER — Encounter: Payer: Self-pay | Admitting: Interventional Cardiology

## 2021-10-05 DIAGNOSIS — Z09 Encounter for follow-up examination after completed treatment for conditions other than malignant neoplasm: Secondary | ICD-10-CM | POA: Diagnosis not present

## 2021-10-05 DIAGNOSIS — Z950 Presence of cardiac pacemaker: Secondary | ICD-10-CM | POA: Diagnosis not present

## 2021-10-05 DIAGNOSIS — R1310 Dysphagia, unspecified: Secondary | ICD-10-CM | POA: Diagnosis not present

## 2021-10-05 DIAGNOSIS — J9811 Atelectasis: Secondary | ICD-10-CM | POA: Diagnosis not present

## 2021-10-05 DIAGNOSIS — Z9049 Acquired absence of other specified parts of digestive tract: Secondary | ICD-10-CM | POA: Diagnosis not present

## 2021-10-05 DIAGNOSIS — I4819 Other persistent atrial fibrillation: Secondary | ICD-10-CM | POA: Diagnosis not present

## 2021-10-05 DIAGNOSIS — C159 Malignant neoplasm of esophagus, unspecified: Secondary | ICD-10-CM | POA: Diagnosis not present

## 2021-10-05 DIAGNOSIS — K449 Diaphragmatic hernia without obstruction or gangrene: Secondary | ICD-10-CM | POA: Diagnosis not present

## 2021-10-05 DIAGNOSIS — Z8719 Personal history of other diseases of the digestive system: Secondary | ICD-10-CM | POA: Diagnosis not present

## 2021-10-05 DIAGNOSIS — Z9889 Other specified postprocedural states: Secondary | ICD-10-CM | POA: Diagnosis not present

## 2021-10-05 DIAGNOSIS — J90 Pleural effusion, not elsewhere classified: Secondary | ICD-10-CM | POA: Diagnosis not present

## 2021-10-05 DIAGNOSIS — Z8679 Personal history of other diseases of the circulatory system: Secondary | ICD-10-CM | POA: Diagnosis not present

## 2021-10-05 MED ORDER — AMLODIPINE BESYLATE 5 MG PO TABS: 5.0000 mg | ORAL_TABLET | ORAL | 3 refills | Status: DC

## 2021-10-05 NOTE — Telephone Encounter (Signed)
Decrease amlodipine to M, W, and F. Sitting and lying BP will get higher, but standing won't drop as much.  ----- Message -----  From: Loren Racer, RN  Sent: 09/28/2021   9:33 AM EST  To: Belva Crome, MD, Loren Racer, RN    Spoke with Eustaquio Maize, RN with [facility] and made her aware of recommendations.  Beth verbalized understanding and will update pt.

## 2021-10-08 DIAGNOSIS — H353132 Nonexudative age-related macular degeneration, bilateral, intermediate dry stage: Secondary | ICD-10-CM | POA: Diagnosis not present

## 2021-10-08 DIAGNOSIS — E113293 Type 2 diabetes mellitus with mild nonproliferative diabetic retinopathy without macular edema, bilateral: Secondary | ICD-10-CM | POA: Diagnosis not present

## 2021-10-08 DIAGNOSIS — H40013 Open angle with borderline findings, low risk, bilateral: Secondary | ICD-10-CM | POA: Diagnosis not present

## 2021-10-08 DIAGNOSIS — H2513 Age-related nuclear cataract, bilateral: Secondary | ICD-10-CM | POA: Diagnosis not present

## 2021-10-20 DIAGNOSIS — E1165 Type 2 diabetes mellitus with hyperglycemia: Secondary | ICD-10-CM | POA: Diagnosis not present

## 2021-10-20 DIAGNOSIS — Z794 Long term (current) use of insulin: Secondary | ICD-10-CM | POA: Diagnosis not present

## 2021-11-03 DIAGNOSIS — R2681 Unsteadiness on feet: Secondary | ICD-10-CM | POA: Diagnosis not present

## 2021-11-03 DIAGNOSIS — R531 Weakness: Secondary | ICD-10-CM | POA: Diagnosis not present

## 2021-11-04 DIAGNOSIS — I4891 Unspecified atrial fibrillation: Secondary | ICD-10-CM | POA: Diagnosis not present

## 2021-11-04 DIAGNOSIS — E1142 Type 2 diabetes mellitus with diabetic polyneuropathy: Secondary | ICD-10-CM | POA: Diagnosis not present

## 2021-11-04 DIAGNOSIS — F411 Generalized anxiety disorder: Secondary | ICD-10-CM | POA: Diagnosis not present

## 2021-11-04 DIAGNOSIS — E113293 Type 2 diabetes mellitus with mild nonproliferative diabetic retinopathy without macular edema, bilateral: Secondary | ICD-10-CM | POA: Diagnosis not present

## 2021-11-04 DIAGNOSIS — I951 Orthostatic hypotension: Secondary | ICD-10-CM | POA: Diagnosis not present

## 2021-11-04 DIAGNOSIS — D649 Anemia, unspecified: Secondary | ICD-10-CM | POA: Diagnosis not present

## 2021-11-04 DIAGNOSIS — I700 Atherosclerosis of aorta: Secondary | ICD-10-CM | POA: Diagnosis not present

## 2021-11-04 DIAGNOSIS — D6869 Other thrombophilia: Secondary | ICD-10-CM | POA: Diagnosis not present

## 2021-11-04 DIAGNOSIS — I10 Essential (primary) hypertension: Secondary | ICD-10-CM | POA: Diagnosis not present

## 2021-11-04 LAB — HM DIABETES FOOT EXAM

## 2021-11-04 LAB — CBC: RBC: 4.3 (ref 3.87–5.11)

## 2021-11-04 LAB — COMPREHENSIVE METABOLIC PANEL
Albumin: 3.6 (ref 3.5–5.0)
Calcium: 9.4 (ref 8.7–10.7)
eGFR: 86

## 2021-11-04 LAB — BASIC METABOLIC PANEL
BUN: 22 — AB (ref 4–21)
CO2: 32 — AB (ref 13–22)
Chloride: 98 — AB (ref 99–108)
Creatinine: 1 (ref 0.6–1.3)
Glucose: 88
Potassium: 4.1 mEq/L (ref 3.5–5.1)
Sodium: 137 (ref 137–147)

## 2021-11-04 LAB — VITAMIN B12: Vitamin B-12: 279

## 2021-11-04 LAB — HEMOGLOBIN A1C: Hemoglobin A1C: 6.9

## 2021-11-04 LAB — CBC AND DIFFERENTIAL
HCT: 37 — AB (ref 41–53)
Hemoglobin: 12.4 — AB (ref 13.5–17.5)
Platelets: 254 10*3/uL (ref 150–400)
WBC: 8.2

## 2021-11-04 LAB — IRON,TIBC AND FERRITIN PANEL: Ferritin: 13.6

## 2021-11-08 DIAGNOSIS — R2681 Unsteadiness on feet: Secondary | ICD-10-CM | POA: Diagnosis not present

## 2021-11-08 DIAGNOSIS — R531 Weakness: Secondary | ICD-10-CM | POA: Diagnosis not present

## 2021-11-09 DIAGNOSIS — R2681 Unsteadiness on feet: Secondary | ICD-10-CM | POA: Diagnosis not present

## 2021-11-09 DIAGNOSIS — R531 Weakness: Secondary | ICD-10-CM | POA: Diagnosis not present

## 2021-11-16 ENCOUNTER — Ambulatory Visit: Payer: Medicare PPO | Admitting: Interventional Cardiology

## 2021-11-16 ENCOUNTER — Encounter: Payer: Self-pay | Admitting: Interventional Cardiology

## 2021-11-16 ENCOUNTER — Other Ambulatory Visit: Payer: Self-pay

## 2021-11-16 VITALS — BP 110/78 | HR 80 | Ht 73.0 in | Wt 242.0 lb

## 2021-11-16 DIAGNOSIS — I2510 Atherosclerotic heart disease of native coronary artery without angina pectoris: Secondary | ICD-10-CM

## 2021-11-16 DIAGNOSIS — I480 Paroxysmal atrial fibrillation: Secondary | ICD-10-CM | POA: Diagnosis not present

## 2021-11-16 DIAGNOSIS — G459 Transient cerebral ischemic attack, unspecified: Secondary | ICD-10-CM | POA: Diagnosis not present

## 2021-11-16 DIAGNOSIS — I951 Orthostatic hypotension: Secondary | ICD-10-CM | POA: Diagnosis not present

## 2021-11-16 DIAGNOSIS — Z7901 Long term (current) use of anticoagulants: Secondary | ICD-10-CM | POA: Diagnosis not present

## 2021-11-16 DIAGNOSIS — R531 Weakness: Secondary | ICD-10-CM | POA: Diagnosis not present

## 2021-11-16 DIAGNOSIS — R2681 Unsteadiness on feet: Secondary | ICD-10-CM | POA: Diagnosis not present

## 2021-11-16 DIAGNOSIS — Z950 Presence of cardiac pacemaker: Secondary | ICD-10-CM

## 2021-11-16 NOTE — Progress Notes (Signed)
?Cardiology Office Note:   ? ?Date:  11/16/2021  ? ?ID:  Lawrence Fudge., DOB 07/01/1951, MRN [id number] ? ?PCP:  [name], MD  ?Cardiologist:  [name], MD  ? ?Referring MD: [name], MD  ? ?Chief Complaint  ?Patient presents with  ? Atrial Fibrillation  ? Follow-up  ?  Orthostasis ?Hypotension  ? ? ?History of Present Illness:   ? ?Lawrence Fudge. is a 71 y.o. male with a hx of atrial fibrillation (Dr[facility]Daubert at [facility]) , dofetilide therapy, chronic anticoagulation, hypertension, TIA, and hyperlipidemia. Prior history of esophagectomy for esophageal cancer 2013.  He had atrial fibrillation at [facility] in 2018 and has had no subsequent atrial fib.  Dofetilide has been discontinued since November 2018. ? ? ?Doing better with feeding.  Still receiving nutrition by oral as well as enterostomy tube.  Still has difficulty with dysphagia.  Has not had syncope but if he stands too long he gets weak.  Requiring Florinef for blood pressure control.  Denies orthopnea, PND, lower extremity swelling.  Difficulty with walking distances because he becomes progressively weak.\\ ? ?Reveals he had pacemaker programming done while hospitalized that was never reverted back to his normal settings, resulting in some of his difficulty with dizziness and fatigue.  Adjustments are being made in the device clinic.  When last interrogated, not much atrial fibrillation was noted. ? ?Past Medical History:  ?Diagnosis Date  ? Bradycardia   ? GERD (gastroesophageal reflux disease)   ? H/O hiatal hernia   ? s/p  repair 06-26-2012  ? History of esophagectomy 08-21-2012  AT [facility]  ? History of kidney stones 2006  ? History of malignant neoplasm of esophagus ONCOLOGIST-  DR D'AMICO AT [facility]-- PER LAST NOTE 01/ 2018  NO RECURRENCE  ? dx 10/ 2013  Stage 2A (T2 N0)  08-21-2012 s/p  esophagectomy w/ gastric pull-through [facility]) post residual recurrent anastomotic stricture  (multiple EGD w/ dilations)  ? History of transient ischemic attack (TIA) 2003  ? no residual  ? Hypertension   ? Long term (current) use of anticoagulants   ? xarelto  ? On dofetilide therapy   ? PAF (paroxysmal atrial fibrillation) ([place]) first dx 2009  ? primary cardiologist-  dr Daneen Schick /  EP cardiologist -- dr Lurene Shadow ([place])  s/p  afib ablation 08-02-2016  ? Pancreatic cyst   ? x2   ? Peyronie's disease   ? Presence of permanent cardiac pacemaker implanted 05-01-2014  at [facility]  ? Dr.Daubert -[facility] follows  ? Pulmonary nodule, right   ? upper and middle lobes --  per last CT stable  ? Type 2 diabetes mellitus treated with insulin ([place])   ? Wears glasses   ? ? ?Past Surgical History:  ?Procedure Laterality Date  ? ANTERIOR CERVICAL DECOMP/DISCECTOMY FUSION N/A 06/02/2016  ? Procedure: ANTERIOR CERVICAL DECOMPRESSION FUSION CERVICAL 5-6, CERVICAL 6-7 WITH INSTRUMENTATION AND ALLOGRAFT;  Surgeon: Phylliss Bob, MD;  Location: [place];  Service: Orthopedics;  Laterality: N/A;  ANTERIOR CERVICAL DECOMPRESSION FUSION CERVICAL 5-6, CERVICAL 6-7 WITH INSTRUMENTATION AND ALLOGRAFT  ? BIOPSY  06/26/2012  ? Procedure: BIOPSY;  Surgeon: Madilyn Hook, DO;  [facility];  Service: General;;  ? CARDIAC ELECTROPHYSIOLOGY STUDY AND ABLATION  08-02-2016   [facility]   (dr Lurene Shadow)  ? pulmonary veins isolation and ablation afib  ? CARDIAC PACEMAKER PLACEMENT  05-01-2014   [facility]  ? CHOLECYSTECTOMY  1988  ? COLONOSCOPY  WITH PROPOFOL N/A 07/13/2015  ? Procedure: COLONOSCOPY WITH PROPOFOL;  Surgeon: Garlan Fair, MD;  [facility];  Service: Endoscopy;  Laterality: N/A;  ? ESOPHAGECTOMY  08-21-2012   [facility]  ? w/ gastric pull-through  ? ESOPHAGOGASTRODUODENOSCOPY (EGD) WITH ESOPHAGEAL DILATION  multiple x32 per pt approx.--- last one 02-09-2015  ? recurrent anastomotic stricture post esophagectomy  ? HIATAL HERNIA REPAIR  06/26/2012  ? Procedure: LAPAROSCOPIC REPAIR OF HIATAL HERNIA;  Surgeon: Madilyn Hook, DO;   [facility];  Service: General;;  ? [place]  06/26/2012  ? Procedure: HERNIA REPAIR INCISIONAL;  Surgeon: Madilyn Hook, DO;  [facility];  Service: General;;  ? INGUINAL HERNIA REPAIR Bilateral 1987  ? JEJUNOSTOMY FEEDING TUBE  12/22/2012  ? Removed 12/ 2016  approx.  ? KNEE ARTHROSCOPY Left 1990  ? MASS EXCISION Left 04/04/2013  ? Procedure: EXCISION OF SCALP MASS;  Surgeon: Madilyn Hook, DO;  [facility];  Service: General;  Laterality: Left;   pilomatrixoma  ? NESBIT PROCEDURE N/A 12/09/2016  ? Procedure: NESBIT PROCEDURE 16 DOT PLICATION;  Surgeon: Kathie Rhodes, MD;  Location: [facility];  Service: Urology;  Laterality: N/A;  ? TONSILLECTOMY  1960  ? TRANSTHORACIC ECHOCARDIOGRAM  08-06-2015   [facility]  ? mild LVH, ef 50%/  mild RAE /  moderate LAE/  trivial AR, MR, PR,  and TR  ? ? ?Current Medications: ?Current Meds  ?Medication Sig  ? apixaban (ELIQUIS) 5 MG TABS tablet Take 5 mg by mouth 2 (two) times daily.  ? Blood Glucose Monitoring Suppl (ONE TOUCH ULTRA 2) w/Device KIT See admin instructions.  ? Blood Glucose Monitoring Suppl (TRUE METRIX AIR GLUCOSE METER) w/Device KIT See admin instructions.  ? Cyanocobalamin (VITAMIN B12) 1000 MCG TBCR Take 1,000 mcg by mouth daily.  ? enoxaparin (LOVENOX) 100 MG/ML injection Inject 1 mL (100 mg total) into the skin every 12 (twelve) hours.  ? ferrous sulfate 325 (65 FE) MG tablet Take 1 tablet by mouth daily.  ? fludrocortisone (FLORINEF) 0.1 MG tablet Take 1 tablet by mouth 2 (two) times daily.  ? FLUoxetine (PROZAC) 20 MG tablet Take 3 tablets by mouth 2 (two) times daily.  ? FLUoxetine HCl 60 MG TABS Take 1 capsule by mouth daily.  ? insulin NPH Human (NOVOLIN N) 100 UNIT/ML injection 30 Units at bedtime.  ? insulin regular (NOVOLIN R) 100 units/mL injection Inject 18 Units into the skin 3 (three) times daily.  ? Insulin Syringe-Needle U-100 (BD VEO INSULIN SYRINGE U/F) 31G X 15/64" 0.3 ML MISC in the morning, at noon, in  the evening, and at bedtime.  ? LORazepam (ATIVAN) 1 MG tablet Place 1 mg into feeding tube 2 (two) times daily.  ? metoprolol tartrate (LOPRESSOR) 25 MG tablet Take 0.5 tablets (12.5 mg total) by mouth 2 (two) times daily.  ? Polyethyl Glycol-Propyl Glycol 0.4-0.3 % SOLN 1 drop at bedtime as needed. 1 drop at bedtime as needed.  ? polyethylene glycol (MIRALAX / GLYCOLAX) 17 g packet 1 packet daily.  ? telmisartan (MICARDIS) 80 MG tablet Take 80 mg by mouth daily.  ? traMADol (ULTRAM) 50 MG tablet Take 50 mg by mouth 2 (two) times daily as needed for moderate pain.  ? VALIUM 5 MG tablet Place 5 mg into feeding tube daily.  ? vitamin B-12 (CYANOCOBALAMIN) 1000 MCG tablet Place 1,000 mcg into feeding tube every other day. Taking Mon, Wed, Fri  ?  ? ?Allergies:   Chlorthalidone, Lisinopril, and  Metformin hcl  ? ?Social History  ? ?Socioeconomic History  ? Marital status: Married  ?  Spouse name: Not on file  ? Number of children: Not on file  ? Years of education: Not on file  ? Highest education level: Not on file  ?Occupational History  ? Not on file  ?Tobacco Use  ? Smoking status: Never  ? Smokeless tobacco: Never  ?Substance and Sexual Activity  ? Alcohol use: Yes  ?  Comment: socially  ? Drug use: No  ? Sexual activity: Not on file  ?Other Topics Concern  ? Not on file  ?Social History Narrative  ? Not on file  ? ?Social Determinants of Health  ? ?Financial Resource Strain: Not on file  ?Food Insecurity: Not on file  ?Transportation Needs: Not on file  ?Physical Activity: Not on file  ?Stress: Not on file  ?Social Connections: Not on file  ?  ? ?Family History: ?The patient's family history includes Cancer in his father. ? ?ROS:   ?Please see the history of present illness.    ?He denies orthopnea, PND, and neurological symptoms.  All other systems reviewed and are negative. ? ?EKGs/Labs/Other Studies Reviewed:   ? ?The following studies were reviewed today: ?No new imaging data.  Will review the telemetry data  from pacemaker interrogations done at [facility]. ? ?EKG:  EKG not repeated ? ?Recent Labs: ?No results found for requested labs within last 8760 hours.  ?Recent Lipid Panel ?   ?Component Value Date/Time  ? CHOL 157 0

## 2021-11-16 NOTE — Patient Instructions (Signed)
Medication Instructions:  ?Your physician recommends that you continue on your current medications as directed. Please refer to the Current Medication list given to you today. ? ?*If you need a refill on your cardiac medications before your next appointment, please call your pharmacy* ? ? ?Lab Work: ?None ?If you have labs (blood work) drawn today and your tests are completely normal, you will receive your results only by: ?MyChart Message (if you have MyChart) OR ?A paper copy in the mail ?If you have any lab test that is abnormal or we need to change your treatment, we will call you to review the results. ? ? ?Testing/Procedures: ?None ? ? ?Follow-Up: ?At [facility], you and your health needs are our priority.  As part of our continuing mission to provide you with exceptional heart care, we have created designated Provider Care Teams.  These Care Teams include your primary Cardiologist (physician) and Advanced Practice Providers (APPs -  Physician Assistants and Nurse Practitioners) who all work together to provide you with the care you need, when you need it. ? ?We recommend signing up for the patient portal called "MyChart".  Sign up information is provided on this After Visit Summary.  MyChart is used to connect with patients for Virtual Visits (Telemedicine).  Patients are able to view lab/test results, encounter notes, upcoming appointments, etc.  Non-urgent messages can be sent to your provider as well.   ?To learn more about what you can do with MyChart, go to [email or web address].   ? ?Your next appointment:   ?4-5 month(s) ? ?The format for your next appointment:   ?In Person ? ?Provider:   ?[name], MD  ? ? ?Other Instructions ?  ?

## 2021-11-18 DIAGNOSIS — R531 Weakness: Secondary | ICD-10-CM | POA: Diagnosis not present

## 2021-11-18 DIAGNOSIS — R2681 Unsteadiness on feet: Secondary | ICD-10-CM | POA: Diagnosis not present

## 2021-11-23 DIAGNOSIS — R531 Weakness: Secondary | ICD-10-CM | POA: Diagnosis not present

## 2021-11-23 DIAGNOSIS — R2681 Unsteadiness on feet: Secondary | ICD-10-CM | POA: Diagnosis not present

## 2021-11-24 DIAGNOSIS — M720 Palmar fascial fibromatosis [Dupuytren]: Secondary | ICD-10-CM | POA: Diagnosis not present

## 2021-11-25 DIAGNOSIS — R531 Weakness: Secondary | ICD-10-CM | POA: Diagnosis not present

## 2021-11-25 DIAGNOSIS — R2681 Unsteadiness on feet: Secondary | ICD-10-CM | POA: Diagnosis not present

## 2021-11-30 DIAGNOSIS — R531 Weakness: Secondary | ICD-10-CM | POA: Diagnosis not present

## 2021-11-30 DIAGNOSIS — R2681 Unsteadiness on feet: Secondary | ICD-10-CM | POA: Diagnosis not present

## 2021-12-01 DIAGNOSIS — E1142 Type 2 diabetes mellitus with diabetic polyneuropathy: Secondary | ICD-10-CM | POA: Diagnosis not present

## 2021-12-01 DIAGNOSIS — I10 Essential (primary) hypertension: Secondary | ICD-10-CM | POA: Diagnosis not present

## 2021-12-01 DIAGNOSIS — E782 Mixed hyperlipidemia: Secondary | ICD-10-CM | POA: Diagnosis not present

## 2021-12-02 ENCOUNTER — Telehealth: Payer: Self-pay | Admitting: Interventional Cardiology

## 2021-12-02 DIAGNOSIS — R2681 Unsteadiness on feet: Secondary | ICD-10-CM | POA: Diagnosis not present

## 2021-12-02 DIAGNOSIS — R531 Weakness: Secondary | ICD-10-CM | POA: Diagnosis not present

## 2021-12-02 NOTE — Telephone Encounter (Signed)
Pt c/o medication issue: ? ?1. Name of Medication: metoprolol tartrate (LOPRESSOR) 25 MG tablet ? ?2. How are you currently taking this medication (dosage and times per day)? 1 tablet twice a day ? ?3. Are you having a reaction (difficulty breathing--STAT)? no ? ?4. What is your medication issue? Lawrence [name]. With [name] states according to the patient's last office note he is supposed to be taking half a tablet of metoprolol twice a day. She says he has been taking 1 whole tablet twice a day and never picked up the half tablet prescription. She says he informed them he has been doing well on the medication and that it has helped his BP. She would like to verify how he is supposed to take the medication. She says she is leaving at 1:30 pm and to call her back after 9:00 am tomorrow. Lawrence [name].: [phone] ? ?

## 2021-12-03 NOTE — Telephone Encounter (Signed)
? ?  Lawrence [name] returning call, she said she is available till 2 pm today ?

## 2021-12-03 NOTE — Telephone Encounter (Signed)
Spoke with Joellen Jersey who states that the patient has been taking metoprolol 25 mg twice daily instead of 12.5 mg twice daily.  ?She states that he has been feeling good. Denies any lightheadedness or dizziness. No symptoms of orthostatic hypotension. He mentioned to [place] that his heart rate has been good. The only readings she has is when the patient was in their office HR was 89 and BP 121/79. She would like to know if the patient should continue on the 25 mg BID as he has been doing or reduce it to 12.5 mg BID as prescribed.  ?

## 2021-12-03 NOTE — Telephone Encounter (Signed)
Left message for Lawrence [name] to call back.

## 2021-12-03 NOTE — Telephone Encounter (Signed)
Attempted to call Katie back, unable to get through to anyone. Will try again. ?

## 2021-12-06 MED ORDER — METOPROLOL TARTRATE 25 MG PO TABS: 25.0000 mg | ORAL_TABLET | Freq: Two times a day (BID) | ORAL | 3 refills | Status: DC

## 2021-12-06 NOTE — Telephone Encounter (Signed)
Prescription updated and sent to pharmacy. ? ?Attempted to call Katie back and was on hold for 10 mins.  Will try again later. ?

## 2021-12-07 DIAGNOSIS — Z8719 Personal history of other diseases of the digestive system: Secondary | ICD-10-CM | POA: Diagnosis not present

## 2021-12-07 DIAGNOSIS — Z9889 Other specified postprocedural states: Secondary | ICD-10-CM | POA: Diagnosis not present

## 2021-12-07 DIAGNOSIS — E119 Type 2 diabetes mellitus without complications: Secondary | ICD-10-CM | POA: Diagnosis not present

## 2021-12-07 DIAGNOSIS — Z794 Long term (current) use of insulin: Secondary | ICD-10-CM | POA: Diagnosis not present

## 2021-12-07 DIAGNOSIS — Z9049 Acquired absence of other specified parts of digestive tract: Secondary | ICD-10-CM | POA: Diagnosis not present

## 2021-12-07 DIAGNOSIS — Z7901 Long term (current) use of anticoagulants: Secondary | ICD-10-CM | POA: Diagnosis not present

## 2021-12-09 DIAGNOSIS — R2681 Unsteadiness on feet: Secondary | ICD-10-CM | POA: Diagnosis not present

## 2021-12-09 DIAGNOSIS — R531 Weakness: Secondary | ICD-10-CM | POA: Diagnosis not present

## 2021-12-10 NOTE — Telephone Encounter (Signed)
Attempted to call Lawrence [name].  She is not available per Wells Guiles.  Wells Guiles is sending a message to have someone call back.  ?

## 2021-12-14 DIAGNOSIS — R531 Weakness: Secondary | ICD-10-CM | POA: Diagnosis not present

## 2021-12-14 DIAGNOSIS — R2681 Unsteadiness on feet: Secondary | ICD-10-CM | POA: Diagnosis not present

## 2021-12-14 NOTE — Telephone Encounter (Signed)
Called and Lawrence [name] was not in office today.  The gentleman that answered said he is going to send her a message and have her call when she returns to the office. ?

## 2021-12-15 DIAGNOSIS — Z794 Long term (current) use of insulin: Secondary | ICD-10-CM | POA: Diagnosis not present

## 2021-12-15 DIAGNOSIS — E1165 Type 2 diabetes mellitus with hyperglycemia: Secondary | ICD-10-CM | POA: Diagnosis not present

## 2021-12-16 DIAGNOSIS — R531 Weakness: Secondary | ICD-10-CM | POA: Diagnosis not present

## 2021-12-16 DIAGNOSIS — R2681 Unsteadiness on feet: Secondary | ICD-10-CM | POA: Diagnosis not present

## 2021-12-24 NOTE — Telephone Encounter (Signed)
Called pt and left message that I was calling to make sure he was able to pick up the new prescription of Metoprolol.  Plan to close note once we know.  Have had many unsuccessful attempts at contacting [place], the original caller. ?

## 2021-12-28 DIAGNOSIS — R531 Weakness: Secondary | ICD-10-CM | POA: Diagnosis not present

## 2021-12-28 DIAGNOSIS — R2681 Unsteadiness on feet: Secondary | ICD-10-CM | POA: Diagnosis not present

## 2021-12-30 DIAGNOSIS — R531 Weakness: Secondary | ICD-10-CM | POA: Diagnosis not present

## 2021-12-30 DIAGNOSIS — R2681 Unsteadiness on feet: Secondary | ICD-10-CM | POA: Diagnosis not present

## 2022-01-04 DIAGNOSIS — C159 Malignant neoplasm of esophagus, unspecified: Secondary | ICD-10-CM | POA: Diagnosis not present

## 2022-01-10 DIAGNOSIS — H35033 Hypertensive retinopathy, bilateral: Secondary | ICD-10-CM | POA: Diagnosis not present

## 2022-01-10 DIAGNOSIS — H43823 Vitreomacular adhesion, bilateral: Secondary | ICD-10-CM | POA: Diagnosis not present

## 2022-01-10 DIAGNOSIS — E113293 Type 2 diabetes mellitus with mild nonproliferative diabetic retinopathy without macular edema, bilateral: Secondary | ICD-10-CM | POA: Diagnosis not present

## 2022-01-10 DIAGNOSIS — H353132 Nonexudative age-related macular degeneration, bilateral, intermediate dry stage: Secondary | ICD-10-CM | POA: Diagnosis not present

## 2022-01-11 DIAGNOSIS — R2681 Unsteadiness on feet: Secondary | ICD-10-CM | POA: Diagnosis not present

## 2022-01-11 DIAGNOSIS — R531 Weakness: Secondary | ICD-10-CM | POA: Diagnosis not present

## 2022-01-13 DIAGNOSIS — R531 Weakness: Secondary | ICD-10-CM | POA: Diagnosis not present

## 2022-01-13 DIAGNOSIS — R2681 Unsteadiness on feet: Secondary | ICD-10-CM | POA: Diagnosis not present

## 2022-01-18 DIAGNOSIS — R2681 Unsteadiness on feet: Secondary | ICD-10-CM | POA: Diagnosis not present

## 2022-01-18 DIAGNOSIS — R531 Weakness: Secondary | ICD-10-CM | POA: Diagnosis not present

## 2022-01-20 DIAGNOSIS — R531 Weakness: Secondary | ICD-10-CM | POA: Diagnosis not present

## 2022-01-20 DIAGNOSIS — R2681 Unsteadiness on feet: Secondary | ICD-10-CM | POA: Diagnosis not present

## 2022-01-25 DIAGNOSIS — R531 Weakness: Secondary | ICD-10-CM | POA: Diagnosis not present

## 2022-01-25 DIAGNOSIS — R2681 Unsteadiness on feet: Secondary | ICD-10-CM | POA: Diagnosis not present

## 2022-01-27 DIAGNOSIS — I4819 Other persistent atrial fibrillation: Secondary | ICD-10-CM | POA: Diagnosis not present

## 2022-01-27 DIAGNOSIS — Z950 Presence of cardiac pacemaker: Secondary | ICD-10-CM | POA: Diagnosis not present

## 2022-02-03 DIAGNOSIS — E1165 Type 2 diabetes mellitus with hyperglycemia: Secondary | ICD-10-CM | POA: Diagnosis not present

## 2022-02-03 DIAGNOSIS — Z794 Long term (current) use of insulin: Secondary | ICD-10-CM | POA: Diagnosis not present

## 2022-02-08 DIAGNOSIS — R2681 Unsteadiness on feet: Secondary | ICD-10-CM | POA: Diagnosis not present

## 2022-02-08 DIAGNOSIS — R531 Weakness: Secondary | ICD-10-CM | POA: Diagnosis not present

## 2022-02-10 DIAGNOSIS — R2681 Unsteadiness on feet: Secondary | ICD-10-CM | POA: Diagnosis not present

## 2022-02-10 DIAGNOSIS — R531 Weakness: Secondary | ICD-10-CM | POA: Diagnosis not present

## 2022-02-14 DIAGNOSIS — E113293 Type 2 diabetes mellitus with mild nonproliferative diabetic retinopathy without macular edema, bilateral: Secondary | ICD-10-CM | POA: Diagnosis not present

## 2022-02-14 DIAGNOSIS — H35033 Hypertensive retinopathy, bilateral: Secondary | ICD-10-CM | POA: Diagnosis not present

## 2022-02-14 DIAGNOSIS — H43823 Vitreomacular adhesion, bilateral: Secondary | ICD-10-CM | POA: Diagnosis not present

## 2022-02-14 DIAGNOSIS — H353132 Nonexudative age-related macular degeneration, bilateral, intermediate dry stage: Secondary | ICD-10-CM | POA: Diagnosis not present

## 2022-02-18 DIAGNOSIS — E782 Mixed hyperlipidemia: Secondary | ICD-10-CM | POA: Diagnosis not present

## 2022-02-21 DIAGNOSIS — I10 Essential (primary) hypertension: Secondary | ICD-10-CM | POA: Diagnosis not present

## 2022-02-21 DIAGNOSIS — N400 Enlarged prostate without lower urinary tract symptoms: Secondary | ICD-10-CM | POA: Diagnosis not present

## 2022-02-21 DIAGNOSIS — F325 Major depressive disorder, single episode, in full remission: Secondary | ICD-10-CM | POA: Diagnosis not present

## 2022-02-21 DIAGNOSIS — E782 Mixed hyperlipidemia: Secondary | ICD-10-CM | POA: Diagnosis not present

## 2022-02-21 DIAGNOSIS — F411 Generalized anxiety disorder: Secondary | ICD-10-CM | POA: Diagnosis not present

## 2022-02-21 DIAGNOSIS — K219 Gastro-esophageal reflux disease without esophagitis: Secondary | ICD-10-CM | POA: Diagnosis not present

## 2022-02-21 DIAGNOSIS — I4891 Unspecified atrial fibrillation: Secondary | ICD-10-CM | POA: Diagnosis not present

## 2022-02-24 DIAGNOSIS — Z950 Presence of cardiac pacemaker: Secondary | ICD-10-CM | POA: Diagnosis not present

## 2022-03-03 ENCOUNTER — Other Ambulatory Visit: Payer: Self-pay | Admitting: Interventional Cardiology

## 2022-03-03 DIAGNOSIS — I480 Paroxysmal atrial fibrillation: Secondary | ICD-10-CM

## 2022-03-03 DIAGNOSIS — Z950 Presence of cardiac pacemaker: Secondary | ICD-10-CM | POA: Diagnosis not present

## 2022-03-03 NOTE — Telephone Encounter (Signed)
Appropriate dose and refill sent to requested pharmacy.

## 2022-03-03 NOTE — Telephone Encounter (Signed)
Prescription refill request for Eliquis received. Indication: Afib  Last office visit: 11/16/21 Tamala Julian)  Scr: 1.0 (08/06/21) Age: 71 Weight: 109.8kg

## 2022-03-16 DIAGNOSIS — E1165 Type 2 diabetes mellitus with hyperglycemia: Secondary | ICD-10-CM | POA: Diagnosis not present

## 2022-03-17 DIAGNOSIS — Z794 Long term (current) use of insulin: Secondary | ICD-10-CM | POA: Diagnosis not present

## 2022-03-17 DIAGNOSIS — E782 Mixed hyperlipidemia: Secondary | ICD-10-CM | POA: Diagnosis not present

## 2022-03-17 DIAGNOSIS — K219 Gastro-esophageal reflux disease without esophagitis: Secondary | ICD-10-CM | POA: Diagnosis not present

## 2022-03-17 DIAGNOSIS — I4891 Unspecified atrial fibrillation: Secondary | ICD-10-CM | POA: Diagnosis not present

## 2022-03-17 DIAGNOSIS — E1165 Type 2 diabetes mellitus with hyperglycemia: Secondary | ICD-10-CM | POA: Diagnosis not present

## 2022-03-17 DIAGNOSIS — I10 Essential (primary) hypertension: Secondary | ICD-10-CM | POA: Diagnosis not present

## 2022-03-29 ENCOUNTER — Other Ambulatory Visit: Payer: Self-pay | Admitting: Physician Assistant

## 2022-03-29 MED ORDER — FLUDROCORTISONE ACETATE 0.1 MG PO TABS: 100.0000 ug | ORAL_TABLET | Freq: Two times a day (BID) | ORAL | 2 refills | Status: DC

## 2022-03-30 DIAGNOSIS — E119 Type 2 diabetes mellitus without complications: Secondary | ICD-10-CM | POA: Diagnosis not present

## 2022-03-30 DIAGNOSIS — Z794 Long term (current) use of insulin: Secondary | ICD-10-CM | POA: Diagnosis not present

## 2022-03-30 DIAGNOSIS — Z7189 Other specified counseling: Secondary | ICD-10-CM | POA: Diagnosis not present

## 2022-04-03 NOTE — Progress Notes (Unsigned)
Cardiology Office Note:    Date:  04/04/2022   ID:  Lawrence Fudge., DOB 1951-07-03, MRN [id number]  PCP:  [name], MD  Cardiologist:  [name], MD   Referring MD: [name], MD   Chief Complaint  Patient presents with   Follow-up    Orthostasis   Atrial Fibrillation    Chronic anticoagulation Dofetilide therapy    History of Present Illness:    Lawrence [name]. is a 71 y.o. male with a hx of atrial fibrillation (Dr.Daubert at [facility]) , dofetilide therapy, chronic anticoagulation, hypertension, TIA, and hyperlipidemia. Prior history of esophagectomy for esophageal cancer 2013.  He had atrial fibrillation at [facility] in 2018 and has had no subsequent atrial fib.  Dofetilide has been discontinued since November 2018.  Prior h/o esophagectomy 2013 with revision 2022.  No cardiac complaints.  No recognized episodes of atrial fibrillation.  No device detected atrial arrhythmias.  He continues on dofetilide.  He has not had significant orthostatic changes.  He is wearing compression stockings.  He is also on Florinef 0.1 mg twice daily.  Orthostatic dizziness has completely resolved.  Past Medical History:  Diagnosis Date   Bradycardia    GERD (gastroesophageal reflux disease)    H/O hiatal hernia    s/p  repair 06-26-2012   History of esophagectomy 08-21-2012  AT [facility]   History of kidney stones 2006   History of malignant neoplasm of esophagus ONCOLOGIST-  DR D'AMICO AT [facility]-- PER LAST NOTE 01/ 2018  NO RECURRENCE   dx 10/ 2013  Stage 2A (T2 N0)  08-21-2012 s/p  esophagectomy w/ gastric pull-through [facility]) post residual recurrent anastomotic stricture (multiple EGD w/ dilations)   History of transient ischemic attack (TIA) 2003   no residual   Hypertension    Long term (current) use of anticoagulants    xarelto   On dofetilide therapy    PAF (paroxysmal atrial fibrillation) ([place]) first dx 2009   primary  cardiologist-  dr Daneen Schick /  EP cardiologist -- dr Lurene [facility] ([facility])  s/p  afib ablation 08-02-2016   Pancreatic cyst    x2    Peyronie's disease    Presence of permanent cardiac pacemaker implanted 05-01-2014  at [facility] follows   Pulmonary nodule, right    upper and middle lobes --  per last CT stable   Type 2 diabetes mellitus treated with insulin ([place])    Wears glasses     Past Surgical History:  Procedure Laterality Date   ANTERIOR CERVICAL DECOMP/DISCECTOMY FUSION N/A 06/02/2016   Procedure: ANTERIOR CERVICAL DECOMPRESSION FUSION CERVICAL 5-6, CERVICAL 6-7 WITH INSTRUMENTATION AND ALLOGRAFT;  Surgeon: Phylliss Bob, MD;  Location: [place];  Service: Orthopedics;  Laterality: N/A;  ANTERIOR CERVICAL DECOMPRESSION FUSION CERVICAL 5-6, CERVICAL 6-7 WITH INSTRUMENTATION AND ALLOGRAFT   BIOPSY  06/26/2012   Procedure: BIOPSY;  Surgeon: Madilyn Hook, DO;  [facility];  Service: General;;   CARDIAC ELECTROPHYSIOLOGY STUDY AND ABLATION  08-02-2016   [facility]   (dr Lurene Shadow)   pulmonary veins isolation and ablation afib   CARDIAC PACEMAKER PLACEMENT  05-01-2014   [facility]   CHOLECYSTECTOMY  1988   COLONOSCOPY WITH PROPOFOL N/A 07/13/2015   Procedure: COLONOSCOPY WITH PROPOFOL;  Surgeon: Garlan Fair, MD;  [facility];  Service: Endoscopy;  Laterality: N/A;   ESOPHAGECTOMY  08-21-2012   [facility]   w/ gastric pull-through   ESOPHAGOGASTRODUODENOSCOPY (  EGD) WITH ESOPHAGEAL DILATION  multiple x32 per pt approx.--- last one 02-09-2015   recurrent anastomotic stricture post esophagectomy   HIATAL HERNIA REPAIR  06/26/2012   Procedure: LAPAROSCOPIC REPAIR OF HIATAL HERNIA;  Surgeon: Madilyn Hook, DO;  [facility];  Service: General;;   [place]  06/26/2012   Procedure: HERNIA REPAIR INCISIONAL;  Surgeon: Madilyn Hook, DO;  [facility];  Service: General;;   INGUINAL HERNIA REPAIR Bilateral 1987   JEJUNOSTOMY FEEDING TUBE  12/22/2012    Removed 12/ 2016  approx.   KNEE ARTHROSCOPY Left 1990   MASS EXCISION Left 04/04/2013   Procedure: EXCISION OF SCALP MASS;  Surgeon: Madilyn Hook, DO;  [facility];  Service: General;  Laterality: Left;   pilomatrixoma   NESBIT PROCEDURE N/A 12/09/2016   Procedure: NESBIT PROCEDURE 16 DOT PLICATION;  Surgeon: Kathie Rhodes, MD;  Location: [place];  Service: Urology;  Laterality: N/A;   TONSILLECTOMY  1960   TRANSTHORACIC ECHOCARDIOGRAM  08-06-2015   [facility]   mild LVH, ef 50%/  mild RAE /  moderate LAE/  trivial AR, MR, PR,  and TR    Current Medications: Current Meds  Medication Sig   acetaminophen (TYLENOL) 325 MG tablet Take 1 tablet by mouth as needed.   amLODipine (NORVASC) 5 MG tablet Take 1 tablet (5 mg total) by mouth every Monday, Wednesday, and Friday.   ELIQUIS 5 MG TABS tablet TAKE 1 TABLET BY MOUTH TWICE A DAY   ferrous sulfate 325 (65 FE) MG tablet Take 1 tablet by mouth daily.   fludrocortisone (FLORINEF) 0.1 MG tablet Take 1 tablet (100 mcg total) by mouth 2 (two) times daily.   FLUoxetine (PROZAC) 20 MG tablet Take 20 mg by mouth daily. Pt takes 3 tablet 60 mg once a day.   insulin NPH Human (NOVOLIN N) 100 UNIT/ML injection 30 Units at bedtime.   insulin regular (NOVOLIN R) 100 units/mL injection Inject 18 Units into the skin 3 (three) times daily.   Insulin Syringe-Needle U-100 (BD VEO INSULIN SYRINGE U/F) 31G X 15/64" 0.3 ML MISC in the morning, at noon, in the evening, and at bedtime.   LORazepam (ATIVAN) 1 MG tablet Place 1 mg into feeding tube 2 (two) times daily.   metoprolol tartrate (LOPRESSOR) 25 MG tablet Take 1 tablet (25 mg total) by mouth 2 (two) times daily.   pantoprazole (PROTONIX) 40 MG tablet Take 40 mg by mouth daily.   Polyethyl Glycol-Propyl Glycol 0.4-0.3 % SOLN 1 drop at bedtime as needed. 1 drop at bedtime as needed.   polyethylene glycol (MIRALAX / GLYCOLAX) 17 g packet 1 packet daily.   traMADol (ULTRAM) 50 MG tablet Take 1  tablet by mouth daily at 12 noon.   vitamin B-12 (CYANOCOBALAMIN) 1000 MCG tablet Place 1,000 mcg into feeding tube every other day. Taking Mon, Wed, Fri     Allergies:   Chlorthalidone, Lisinopril, and Metformin hcl   Social History   Socioeconomic History   Marital status: Married    Spouse name: Not on file   Number of children: Not on file   Years of education: Not on file   Highest education level: Not on file  Occupational History   Not on file  Tobacco Use   Smoking status: Never   Smokeless tobacco: Never  Substance and Sexual Activity   Alcohol use: Yes    Comment: socially   Drug use: No   Sexual activity: Not on file  Other  Topics Concern   Not on file  Social History Narrative   Not on file   Social Determinants of Health   Financial Resource Strain: Not on file  Food Insecurity: Not on file  Transportation Needs: Not on file  Physical Activity: Not on file  Stress: Not on file  Social Connections: Not on file     Family History: The patient's family history includes Cancer in his father.  ROS:   Please see the history of present illness.    Has reached the level of equivalents relative to swallowing.  He is able to keep down only one third of the food that he eats every day.  When he swallows will build up and he will have regurgitation.  He has an upcoming procedure at [facility] later this week.  This will be an endoscopy.  All other systems reviewed and are negative.  EKGs/Labs/Other Studies Reviewed:    The following studies were reviewed today: None  EKG:  EKG not performed  Recent Labs: No results found for requested labs within last 365 days.  Recent Lipid Panel    Component Value Date/Time   CHOL 157 04/13/2012 0825   TRIG 103 04/13/2012 0825   HDL 40 04/13/2012 0825   CHOLHDL 3.9 04/13/2012 0825   VLDL 21 04/13/2012 0825   LDLCALC 96 04/13/2012 0825    Physical Exam:    VS:  BP 122/78   Pulse 71   Ht '6\' 1"'$  (1.854 m)   Wt 233 lb  3.2 oz (105.8 kg)   SpO2 96%   BMI 30.77 kg/m     Wt Readings from Last 3 Encounters:  04/04/22 233 lb 3.2 oz (105.8 kg)  11/16/21 242 lb (109.8 kg)  09/14/21 235 lb 3.2 oz (106.7 kg)     GEN: Slightly overweight. No acute distress HEENT: Normal NECK: No JVD. LYMPHATICS: No lymphadenopathy CARDIAC: No murmur. RRR no gallop, or edema. VASCULAR:  Normal Pulses. No bruits. RESPIRATORY:  Clear to auscultation without rales, wheezing or rhonchi  ABDOMEN: Soft, non-tender, non-distended, No pulsatile mass, MUSCULOSKELETAL: No deformity  SKIN: Warm and dry NEUROLOGIC:  Alert and oriented x 3 PSYCHIATRIC:  Normal affect   ASSESSMENT:    1. Paroxysmal atrial fibrillation (HCC)   2. S/P ablation of atrial fibrillation   3. Orthostatic hypotension   4. Coronary artery disease involving native coronary artery of native heart without angina pectoris   5. DDD Pacemaker   6. TIA (transient ischemic attack)   7. Long term (current) use of anticoagulants   8. Hypertension, benign    PLAN:    In order of problems listed above:  No episodes.  Stable on dofetilide. Noted No orthostasis today with blood pressure 122/78 mmHg sitting and 120/70 mmHg standing.  Plan to decrease Florinef to 0.1 mg nightly.  He should do this for at least 1 month.  I have left him with the discretion to discontinue the Florinef altogether if he continues to feel well after the dose reduction noted. Asymptomatic. Continue to have pacemaker followed transtelephonically. No recurrence Continue Eliquis.  No bleeding complications. Not an issue.  Continue with Micardis.    Clinical follow-up with me or an assigned cardiologist in 6 to 9 months.  Call if orthostasis/dizziness develops on reduced dose of Florinef.   Medication Adjustments/Labs and Tests Ordered: Current medicines are reviewed at length with the patient today.  Concerns regarding medicines are outlined above.  No orders of the defined types were  placed in  this encounter.  No orders of the defined types were placed in this encounter.   Patient Instructions  Medication Instructions:  Your physician has recommended you make the following change in your medication:  1) DECREASE    *If you need a refill on your cardiac medications before your next appointment, please call your pharmacy*  Lab Work: NONE  Testing/Procedures: NONE  Follow-Up: At [facility], you and your health needs are our priority.  As part of our continuing mission to provide you with exceptional heart care, we have created designated Provider Care Teams.  These Care Teams include your primary Cardiologist (physician) and Advanced Practice Providers (APPs -  Physician Assistants and Nurse Practitioners) who all work together to provide you with the care you need, when you need it.  Your next appointment:   6-9 month(s)  The format for your next appointment:   In Person  Provider:   [name], MD {   Important Information About Sugar         Signed, [name], MD  04/04/2022 8:42 AM    [place]

## 2022-04-04 ENCOUNTER — Ambulatory Visit: Payer: Medicare PPO | Admitting: Interventional Cardiology

## 2022-04-04 ENCOUNTER — Encounter: Payer: Self-pay | Admitting: Interventional Cardiology

## 2022-04-04 VITALS — BP 122/78 | HR 71 | Ht 73.0 in | Wt 233.2 lb

## 2022-04-04 DIAGNOSIS — Z950 Presence of cardiac pacemaker: Secondary | ICD-10-CM

## 2022-04-04 DIAGNOSIS — Z8679 Personal history of other diseases of the circulatory system: Secondary | ICD-10-CM

## 2022-04-04 DIAGNOSIS — I2510 Atherosclerotic heart disease of native coronary artery without angina pectoris: Secondary | ICD-10-CM | POA: Diagnosis not present

## 2022-04-04 DIAGNOSIS — Z9889 Other specified postprocedural states: Secondary | ICD-10-CM | POA: Diagnosis not present

## 2022-04-04 DIAGNOSIS — Z7901 Long term (current) use of anticoagulants: Secondary | ICD-10-CM | POA: Diagnosis not present

## 2022-04-04 DIAGNOSIS — G459 Transient cerebral ischemic attack, unspecified: Secondary | ICD-10-CM | POA: Diagnosis not present

## 2022-04-04 DIAGNOSIS — I951 Orthostatic hypotension: Secondary | ICD-10-CM

## 2022-04-04 DIAGNOSIS — I480 Paroxysmal atrial fibrillation: Secondary | ICD-10-CM

## 2022-04-04 DIAGNOSIS — I10 Essential (primary) hypertension: Secondary | ICD-10-CM | POA: Diagnosis not present

## 2022-04-04 MED ORDER — FLUDROCORTISONE ACETATE 0.1 MG PO TABS: 0.1000 mg | ORAL_TABLET | Freq: Every day | ORAL | Status: DC

## 2022-04-04 NOTE — Patient Instructions (Addendum)
Medication Instructions:  Your physician has recommended you make the following change in your medication:  1) DECREASE fludrocortisone (Florinef) to 0.'1mg'$  (112mg) once daily at bedtime, may stop after 1 month.  *If you need a refill on your cardiac medications before your next appointment, please call your pharmacy*  Lab Work: NONE  Testing/Procedures: NONE  Follow-Up: At [facility] you and your health needs are our priority.  As part of our continuing mission to provide you with exceptional heart care, we have created designated Provider Care Teams.  These Care Teams include your primary Cardiologist (physician) and Advanced Practice Providers (APPs -  Physician Assistants and Nurse Practitioners) who all work together to provide you with the care you need, when you need it.  Your next appointment:   6-9 month(s)  The format for your next appointment:   In Person  Provider:   HSinclair Grooms MD {   Important Information About [employer]

## 2022-04-06 DIAGNOSIS — Z1331 Encounter for screening for depression: Secondary | ICD-10-CM | POA: Diagnosis not present

## 2022-04-06 DIAGNOSIS — E1142 Type 2 diabetes mellitus with diabetic polyneuropathy: Secondary | ICD-10-CM | POA: Diagnosis not present

## 2022-04-06 DIAGNOSIS — D6869 Other thrombophilia: Secondary | ICD-10-CM | POA: Diagnosis not present

## 2022-04-06 DIAGNOSIS — Z0000 Encounter for general adult medical examination without abnormal findings: Secondary | ICD-10-CM | POA: Diagnosis not present

## 2022-04-06 DIAGNOSIS — F411 Generalized anxiety disorder: Secondary | ICD-10-CM | POA: Diagnosis not present

## 2022-04-06 DIAGNOSIS — E782 Mixed hyperlipidemia: Secondary | ICD-10-CM | POA: Diagnosis not present

## 2022-04-06 DIAGNOSIS — F325 Major depressive disorder, single episode, in full remission: Secondary | ICD-10-CM | POA: Diagnosis not present

## 2022-04-06 DIAGNOSIS — I10 Essential (primary) hypertension: Secondary | ICD-10-CM | POA: Diagnosis not present

## 2022-04-06 DIAGNOSIS — I951 Orthostatic hypotension: Secondary | ICD-10-CM | POA: Diagnosis not present

## 2022-04-06 DIAGNOSIS — E113293 Type 2 diabetes mellitus with mild nonproliferative diabetic retinopathy without macular edema, bilateral: Secondary | ICD-10-CM | POA: Diagnosis not present

## 2022-04-06 LAB — CBC AND DIFFERENTIAL
HCT: 42 (ref 41–53)
Hemoglobin: 15.1 (ref 13.5–17.5)
Platelets: 207 10*3/uL (ref 150–400)
WBC: 8.2

## 2022-04-06 LAB — CBC: RBC: 4.44 (ref 3.87–5.11)

## 2022-04-06 LAB — LIPID PANEL
Cholesterol: 168 (ref 0–200)
HDL: 46 (ref 35–70)
LDL Cholesterol: 77
Triglycerides: 279 — AB (ref 40–160)

## 2022-04-06 LAB — COMPREHENSIVE METABOLIC PANEL
Albumin: 3.8 (ref 3.5–5.0)
Calcium: 9.5 (ref 8.7–10.7)
eGFR: 75

## 2022-04-06 LAB — HEPATIC FUNCTION PANEL
ALT: 10 U/L (ref 10–40)
AST: 13 — AB (ref 14–40)
Alkaline Phosphatase: 153 — AB (ref 25–125)
Bilirubin, Total: 0.7

## 2022-04-06 LAB — HM DIABETES FOOT EXAM

## 2022-04-06 LAB — HEMOGLOBIN A1C: Hemoglobin A1C: 7.3

## 2022-04-06 LAB — BASIC METABOLIC PANEL
BUN: 13 (ref 4–21)
CO2: 35 — AB (ref 13–22)
Chloride: 94 — AB (ref 99–108)
Creatinine: 1.1 (ref 0.6–1.3)
Glucose: 185
Potassium: 3 mEq/L — AB (ref 3.5–5.1)
Sodium: 139 (ref 137–147)

## 2022-04-06 LAB — MICROALBUMIN, URINE: Microalb, Ur: 9.5

## 2022-04-06 LAB — PROTEIN / CREATININE RATIO, URINE: Creatinine, Urine: 268

## 2022-04-06 LAB — HM DIABETES EYE EXAM

## 2022-04-07 DIAGNOSIS — J90 Pleural effusion, not elsewhere classified: Secondary | ICD-10-CM | POA: Diagnosis not present

## 2022-04-07 DIAGNOSIS — Z8719 Personal history of other diseases of the digestive system: Secondary | ICD-10-CM | POA: Diagnosis not present

## 2022-04-07 DIAGNOSIS — C159 Malignant neoplasm of esophagus, unspecified: Secondary | ICD-10-CM | POA: Diagnosis not present

## 2022-04-07 DIAGNOSIS — Z9049 Acquired absence of other specified parts of digestive tract: Secondary | ICD-10-CM | POA: Diagnosis not present

## 2022-04-07 DIAGNOSIS — J9811 Atelectasis: Secondary | ICD-10-CM | POA: Diagnosis not present

## 2022-04-07 DIAGNOSIS — Z7901 Long term (current) use of anticoagulants: Secondary | ICD-10-CM | POA: Diagnosis not present

## 2022-04-07 DIAGNOSIS — K222 Esophageal obstruction: Secondary | ICD-10-CM | POA: Diagnosis not present

## 2022-04-07 DIAGNOSIS — Z01818 Encounter for other preprocedural examination: Secondary | ICD-10-CM | POA: Diagnosis not present

## 2022-04-08 ENCOUNTER — Ambulatory Visit: Payer: Medicare PPO | Admitting: Podiatry

## 2022-04-08 DIAGNOSIS — K30 Functional dyspepsia: Secondary | ICD-10-CM | POA: Diagnosis not present

## 2022-04-08 DIAGNOSIS — Z9049 Acquired absence of other specified parts of digestive tract: Secondary | ICD-10-CM | POA: Diagnosis not present

## 2022-04-08 DIAGNOSIS — G8929 Other chronic pain: Secondary | ICD-10-CM | POA: Diagnosis not present

## 2022-04-08 DIAGNOSIS — K311 Adult hypertrophic pyloric stenosis: Secondary | ICD-10-CM | POA: Diagnosis not present

## 2022-04-08 DIAGNOSIS — E1140 Type 2 diabetes mellitus with diabetic neuropathy, unspecified: Secondary | ICD-10-CM | POA: Diagnosis not present

## 2022-04-08 DIAGNOSIS — I4891 Unspecified atrial fibrillation: Secondary | ICD-10-CM | POA: Diagnosis not present

## 2022-04-08 DIAGNOSIS — M160 Bilateral primary osteoarthritis of hip: Secondary | ICD-10-CM | POA: Diagnosis not present

## 2022-04-08 DIAGNOSIS — E119 Type 2 diabetes mellitus without complications: Secondary | ICD-10-CM | POA: Diagnosis not present

## 2022-04-08 DIAGNOSIS — I10 Essential (primary) hypertension: Secondary | ICD-10-CM | POA: Diagnosis not present

## 2022-04-08 DIAGNOSIS — I480 Paroxysmal atrial fibrillation: Secondary | ICD-10-CM | POA: Diagnosis not present

## 2022-04-08 DIAGNOSIS — K219 Gastro-esophageal reflux disease without esophagitis: Secondary | ICD-10-CM | POA: Diagnosis not present

## 2022-04-08 DIAGNOSIS — F418 Other specified anxiety disorders: Secondary | ICD-10-CM | POA: Diagnosis not present

## 2022-04-08 DIAGNOSIS — M170 Bilateral primary osteoarthritis of knee: Secondary | ICD-10-CM | POA: Diagnosis not present

## 2022-04-15 ENCOUNTER — Ambulatory Visit: Payer: Medicare PPO | Admitting: Podiatry

## 2022-04-15 ENCOUNTER — Encounter: Payer: Self-pay | Admitting: Podiatry

## 2022-04-15 DIAGNOSIS — M79674 Pain in right toe(s): Secondary | ICD-10-CM | POA: Diagnosis not present

## 2022-04-15 DIAGNOSIS — M79675 Pain in left toe(s): Secondary | ICD-10-CM | POA: Diagnosis not present

## 2022-04-15 DIAGNOSIS — E1142 Type 2 diabetes mellitus with diabetic polyneuropathy: Secondary | ICD-10-CM

## 2022-04-15 DIAGNOSIS — B351 Tinea unguium: Secondary | ICD-10-CM

## 2022-04-15 NOTE — Progress Notes (Signed)
  Subjective:  Patient ID: Lawrence Fudge., male    DOB: [birth year],   MRN: [id number]  Chief Complaint  Patient presents with   Diabetes     diabetic foot check    71 y.o. male presents for diabetic foot check. And concern of thickened elongated and painful nails that are difficult to trim. Requesting to have them trimmed today. Concern for left hallux nail being loose. Relates burning and numbness in their feet. Patient is diabetic and last A1c was  Lab Results  Component Value Date   HGBA1C 8.6 (H) 04/13/2012   .   PCP:  [name], MD     . Denies any other pedal complaints. Denies n/v/f/c.   Past Medical History:  Diagnosis Date   Bradycardia    GERD (gastroesophageal reflux disease)    H/O hiatal hernia    s/p  repair 06-26-2012   History of esophagectomy 08-21-2012  AT [facility]   History of kidney stones 2006   History of malignant neoplasm of esophagus ONCOLOGIST-  DR D'AMICO AT [facility]-- PER LAST NOTE 01/ 2018  NO RECURRENCE   dx 10/ 2013  Stage 2A (T2 N0)  08-21-2012 s/p  esophagectomy w/ gastric pull-through [facility]) post residual recurrent anastomotic stricture (multiple EGD w/ dilations)   History of transient ischemic attack (TIA) 2003   no residual   Hypertension    Long term (current) use of anticoagulants    xarelto   On dofetilide therapy    PAF (paroxysmal atrial fibrillation) ([place]) first dx 2009   primary cardiologist-  dr Daneen Schick /  EP cardiologist -- dr Lurene Shadow ([facility])  s/p  afib ablation 08-02-2016   Pancreatic cyst    x2    Peyronie's disease    Presence of permanent cardiac pacemaker implanted 05-01-2014  at [facility] follows   Pulmonary nodule, right    upper and middle lobes --  per last CT stable   Type 2 diabetes mellitus treated with insulin (HCC)    Wears glasses     Objective:  Physical Exam: Vascular: DP/PT pulses 2/4 bilateral. CFT <3 seconds. Absent hair growth on digits. Edema noted to bilateral lower  extremities. Xerosis noted bilaterally.  Skin. No lacerations or abrasions bilateral feet. Nails 1-5 bilateral  are thickened discolored and elongated with subungual debris. Left hallux nail loosened to base.  Musculoskeletal: MMT 5/5 bilateral lower extremities in DF, PF, Inversion and Eversion. Deceased ROM in DF of ankle joint.  Neurological: Sensation intact to light touch. Protective sensation diminished bilateral.    Assessment:   1. Controlled type 2 diabetes mellitus with diabetic polyneuropathy, unspecified whether long term insulin use (HCC)   2. Pain due to onychomycosis of toenails of both feet      Plan:  Patient was evaluated and treated and all questions answered. -Discussed and educated patient on diabetic foot care, especially with  regards to the vascular, neurological and musculoskeletal systems.  -Stressed the importance of good glycemic control and the detriment of not  controlling glucose levels in relation to the foot. -Discussed supportive shoes at all times and checking feet regularly.  -Mechanically debrided all nails 1-5 bilateral using sterile nail nipper and filed with dremel without incident  -Answered all patient questions -Patient to return  in 3 months for at risk foot care -Patient advised to call the office if any problems or questions arise in the meantime.   Lorenda Peck, DPM

## 2022-04-21 DIAGNOSIS — E1165 Type 2 diabetes mellitus with hyperglycemia: Secondary | ICD-10-CM | POA: Diagnosis not present

## 2022-05-17 DIAGNOSIS — H353132 Nonexudative age-related macular degeneration, bilateral, intermediate dry stage: Secondary | ICD-10-CM | POA: Diagnosis not present

## 2022-05-17 DIAGNOSIS — E113293 Type 2 diabetes mellitus with mild nonproliferative diabetic retinopathy without macular edema, bilateral: Secondary | ICD-10-CM | POA: Diagnosis not present

## 2022-05-17 DIAGNOSIS — H43823 Vitreomacular adhesion, bilateral: Secondary | ICD-10-CM | POA: Diagnosis not present

## 2022-05-17 DIAGNOSIS — H35033 Hypertensive retinopathy, bilateral: Secondary | ICD-10-CM | POA: Diagnosis not present

## 2022-05-18 ENCOUNTER — Encounter: Payer: Self-pay | Admitting: Interventional Cardiology

## 2022-05-19 DIAGNOSIS — E7849 Other hyperlipidemia: Secondary | ICD-10-CM | POA: Diagnosis not present

## 2022-05-19 DIAGNOSIS — E113293 Type 2 diabetes mellitus with mild nonproliferative diabetic retinopathy without macular edema, bilateral: Secondary | ICD-10-CM | POA: Diagnosis not present

## 2022-05-19 DIAGNOSIS — Z5181 Encounter for therapeutic drug level monitoring: Secondary | ICD-10-CM | POA: Diagnosis not present

## 2022-05-19 DIAGNOSIS — E7800 Pure hypercholesterolemia, unspecified: Secondary | ICD-10-CM | POA: Diagnosis not present

## 2022-05-19 DIAGNOSIS — R748 Abnormal levels of other serum enzymes: Secondary | ICD-10-CM | POA: Diagnosis not present

## 2022-05-19 LAB — LIPID PANEL
Cholesterol: 163 (ref 0–200)
HDL: 58 (ref 35–70)
LDL Cholesterol: 81
Triglycerides: 144 (ref 40–160)

## 2022-05-19 LAB — HEPATIC FUNCTION PANEL
ALT: 16 U/L (ref 10–40)
AST: 16 (ref 14–40)
Alkaline Phosphatase: 164 — AB (ref 25–125)
Bilirubin, Direct: 0.2
Bilirubin, Total: 0.8

## 2022-05-24 ENCOUNTER — Other Ambulatory Visit: Payer: Self-pay | Admitting: Internal Medicine

## 2022-05-24 ENCOUNTER — Ambulatory Visit
Admission: RE | Admit: 2022-05-24 | Discharge: 2022-05-24 | Disposition: A | Discharge status: Home / Self Care | Payer: Medicare PPO | Source: Ambulatory Visit | Attending: Internal Medicine | Admitting: Internal Medicine

## 2022-05-24 DIAGNOSIS — E876 Hypokalemia: Secondary | ICD-10-CM | POA: Diagnosis not present

## 2022-05-24 DIAGNOSIS — M545 Low back pain, unspecified: Secondary | ICD-10-CM | POA: Diagnosis not present

## 2022-05-24 DIAGNOSIS — M47816 Spondylosis without myelopathy or radiculopathy, lumbar region: Secondary | ICD-10-CM | POA: Diagnosis not present

## 2022-05-24 DIAGNOSIS — R748 Abnormal levels of other serum enzymes: Secondary | ICD-10-CM | POA: Diagnosis not present

## 2022-05-24 LAB — BASIC METABOLIC PANEL
BUN: 18 (ref 4–21)
CO2: 23 — AB (ref 13–22)
Chloride: 98 — AB (ref 99–108)
Creatinine: 1.1 (ref 0.6–1.3)
Glucose: 391
Potassium: 4.8 mEq/L (ref 3.5–5.1)
Sodium: 131 — AB (ref 137–147)

## 2022-05-24 LAB — COMPREHENSIVE METABOLIC PANEL
Albumin: 3.8 (ref 3.5–5.0)
Calcium: 9.2 (ref 8.7–10.7)
eGFR: 72

## 2022-05-24 LAB — TSH: TSH: 1 (ref 0.41–5.90)

## 2022-05-24 LAB — VITAMIN D 25 HYDROXY (VIT D DEFICIENCY, FRACTURES): Vit D, 25-Hydroxy: 33

## 2022-05-25 DIAGNOSIS — E1165 Type 2 diabetes mellitus with hyperglycemia: Secondary | ICD-10-CM | POA: Diagnosis not present

## 2022-05-27 DIAGNOSIS — Z950 Presence of cardiac pacemaker: Secondary | ICD-10-CM | POA: Diagnosis not present

## 2022-05-28 DIAGNOSIS — Z950 Presence of cardiac pacemaker: Secondary | ICD-10-CM | POA: Diagnosis not present

## 2022-05-30 DIAGNOSIS — R748 Abnormal levels of other serum enzymes: Secondary | ICD-10-CM | POA: Diagnosis not present

## 2022-06-02 DIAGNOSIS — Z794 Long term (current) use of insulin: Secondary | ICD-10-CM | POA: Diagnosis not present

## 2022-06-02 DIAGNOSIS — E1165 Type 2 diabetes mellitus with hyperglycemia: Secondary | ICD-10-CM | POA: Diagnosis not present

## 2022-06-03 ENCOUNTER — Other Ambulatory Visit (HOSPITAL_COMMUNITY): Payer: Self-pay | Admitting: Internal Medicine

## 2022-06-03 ENCOUNTER — Other Ambulatory Visit: Payer: Self-pay | Admitting: Internal Medicine

## 2022-06-03 DIAGNOSIS — R748 Abnormal levels of other serum enzymes: Secondary | ICD-10-CM

## 2022-06-15 ENCOUNTER — Ambulatory Visit (HOSPITAL_COMMUNITY)
Admission: RE | Admit: 2022-06-15 | Discharge: 2022-06-15 | Disposition: A | Discharge status: Home / Self Care | Payer: Medicare PPO | Source: Ambulatory Visit | Attending: Internal Medicine | Admitting: Internal Medicine

## 2022-06-15 DIAGNOSIS — Z8501 Personal history of malignant neoplasm of esophagus: Secondary | ICD-10-CM | POA: Diagnosis not present

## 2022-06-15 DIAGNOSIS — R748 Abnormal levels of other serum enzymes: Secondary | ICD-10-CM | POA: Insufficient documentation

## 2022-06-15 DIAGNOSIS — M170 Bilateral primary osteoarthritis of knee: Secondary | ICD-10-CM | POA: Diagnosis not present

## 2022-06-15 MED ORDER — TECHNETIUM TC 99M MEDRONATE IV KIT
20.0000 | PACK | Freq: Once | INTRAVENOUS | Status: AC | PRN
  Administered 2022-06-15: 20 via INTRAVENOUS

## 2022-06-21 ENCOUNTER — Other Ambulatory Visit: Payer: Self-pay | Admitting: Internal Medicine

## 2022-06-21 ENCOUNTER — Ambulatory Visit
Admission: RE | Admit: 2022-06-21 | Discharge: 2022-06-21 | Disposition: A | Discharge status: Home / Self Care | Payer: Medicare PPO | Source: Ambulatory Visit | Attending: Internal Medicine | Admitting: Internal Medicine

## 2022-06-21 DIAGNOSIS — R948 Abnormal results of function studies of other organs and systems: Secondary | ICD-10-CM

## 2022-06-21 DIAGNOSIS — Z950 Presence of cardiac pacemaker: Secondary | ICD-10-CM | POA: Diagnosis not present

## 2022-06-21 DIAGNOSIS — R0781 Pleurodynia: Secondary | ICD-10-CM | POA: Diagnosis not present

## 2022-06-23 ENCOUNTER — Emergency Department (HOSPITAL_BASED_OUTPATIENT_CLINIC_OR_DEPARTMENT_OTHER): Payer: Medicare PPO

## 2022-06-23 ENCOUNTER — Other Ambulatory Visit: Payer: Self-pay

## 2022-06-23 ENCOUNTER — Encounter (HOSPITAL_BASED_OUTPATIENT_CLINIC_OR_DEPARTMENT_OTHER): Payer: Self-pay | Admitting: Emergency Medicine

## 2022-06-23 ENCOUNTER — Emergency Department (HOSPITAL_BASED_OUTPATIENT_CLINIC_OR_DEPARTMENT_OTHER)
Admission: EM | Admit: 2022-06-23 | Discharge: 2022-06-23 | Disposition: A | Discharge status: Home / Self Care | Payer: Medicare PPO | Attending: Emergency Medicine | Admitting: Emergency Medicine

## 2022-06-23 ENCOUNTER — Emergency Department (HOSPITAL_COMMUNITY): Payer: Medicare PPO

## 2022-06-23 DIAGNOSIS — W19XXXA Unspecified fall, initial encounter: Secondary | ICD-10-CM | POA: Insufficient documentation

## 2022-06-23 DIAGNOSIS — I615 Nontraumatic intracerebral hemorrhage, intraventricular: Secondary | ICD-10-CM

## 2022-06-23 DIAGNOSIS — Z7901 Long term (current) use of anticoagulants: Secondary | ICD-10-CM | POA: Insufficient documentation

## 2022-06-23 DIAGNOSIS — R7309 Other abnormal glucose: Secondary | ICD-10-CM | POA: Insufficient documentation

## 2022-06-23 DIAGNOSIS — S06360A Traumatic hemorrhage of cerebrum, unspecified, without loss of consciousness, initial encounter: Secondary | ICD-10-CM | POA: Insufficient documentation

## 2022-06-23 DIAGNOSIS — I10 Essential (primary) hypertension: Secondary | ICD-10-CM | POA: Diagnosis not present

## 2022-06-23 DIAGNOSIS — S0990XA Unspecified injury of head, initial encounter: Secondary | ICD-10-CM | POA: Diagnosis present

## 2022-06-23 DIAGNOSIS — S0003XA Contusion of scalp, initial encounter: Secondary | ICD-10-CM | POA: Diagnosis not present

## 2022-06-23 LAB — APTT: aPTT: 27 seconds (ref 24–36)

## 2022-06-23 LAB — CBC WITH DIFFERENTIAL/PLATELET
Abs Immature Granulocytes: 0.03 10*3/uL (ref 0.00–0.07)
Basophils Absolute: 0.1 10*3/uL (ref 0.0–0.1)
Basophils Relative: 1 %
Eosinophils Absolute: 0.1 10*3/uL (ref 0.0–0.5)
Eosinophils Relative: 1 %
HCT: 39.8 % (ref 39.0–52.0)
Hemoglobin: 13.9 g/dL (ref 13.0–17.0)
Immature Granulocytes: 0 %
Lymphocytes Relative: 14 %
Lymphs Abs: 1 10*3/uL (ref 0.7–4.0)
MCH: 33.9 pg (ref 26.0–34.0)
MCHC: 34.9 g/dL (ref 30.0–36.0)
MCV: 97.1 fL (ref 80.0–100.0)
Monocytes Absolute: 0.6 10*3/uL (ref 0.1–1.0)
Monocytes Relative: 8 %
Neutro Abs: 5.7 10*3/uL (ref 1.7–7.7)
Neutrophils Relative %: 76 %
Platelets: 178 10*3/uL (ref 150–400)
RBC: 4.1 MIL/uL — ABNORMAL LOW (ref 4.22–5.81)
RDW: 11.7 % (ref 11.5–15.5)
WBC: 7.4 10*3/uL (ref 4.0–10.5)
nRBC: 0 % (ref 0.0–0.2)

## 2022-06-23 LAB — BASIC METABOLIC PANEL
Anion gap: 6 (ref 5–15)
BUN: 16 mg/dL (ref 8–23)
CO2: 27 mmol/L (ref 22–32)
Calcium: 9.4 mg/dL (ref 8.9–10.3)
Chloride: 99 mmol/L (ref 98–111)
Creatinine, Ser: 0.92 mg/dL (ref 0.61–1.24)
GFR, Estimated: >60 mL/min (ref >60)
Glucose, Bld: 183 mg/dL — ABNORMAL HIGH (ref 70–99)
Potassium: 5.1 mmol/L (ref 3.5–5.1)
Sodium: 132 mmol/L — ABNORMAL LOW (ref 135–145)

## 2022-06-23 LAB — PROTIME-INR
INR: 1.2 (ref 0.8–1.2)
Prothrombin Time: 14.9 seconds (ref 11.4–15.2)

## 2022-06-23 LAB — CBG MONITORING, ED: Glucose-Capillary: 173 mg/dL — ABNORMAL HIGH (ref 70–99)

## 2022-06-23 MED ORDER — METOPROLOL TARTRATE 25 MG PO TABS
25.0000 mg | ORAL_TABLET | Freq: Two times a day (BID) | ORAL | Status: DC
  Administered 2022-06-23 (×2): 25 mg via ORAL
  Filled 2022-06-23 (×2): qty 1

## 2022-06-23 MED ORDER — LORAZEPAM 2 MG/ML IJ SOLN
0.5000 mg | Freq: Once | INTRAMUSCULAR | Status: AC
  Administered 2022-06-23: 0.5 mg via INTRAVENOUS
  Filled 2022-06-23: qty 1

## 2022-06-23 MED ORDER — STERILE WATER FOR INJECTION IJ SOLN
INTRAMUSCULAR | Status: AC
  Filled 2022-06-23: qty 100

## 2022-06-23 MED ORDER — AMLODIPINE BESYLATE 5 MG PO TABS: 5.0000 mg | ORAL_TABLET | ORAL | Status: DC

## 2022-06-23 MED ORDER — ONDANSETRON HCL 4 MG/2ML IJ SOLN
4.0000 mg | Freq: Once | INTRAMUSCULAR | Status: AC
  Administered 2022-06-23: 4 mg via INTRAVENOUS
  Filled 2022-06-23: qty 2

## 2022-06-23 MED ORDER — COAG FACT XA INACTIVATED-ZHZO 200 MG IV SOLR
INTRAVENOUS | Status: AC
  Filled 2022-06-23: qty 100

## 2022-06-23 MED ORDER — LORAZEPAM 1 MG PO TABS
1.0000 mg | ORAL_TABLET | Freq: Once | ORAL | Status: AC
  Administered 2022-06-23: 1 mg via ORAL
  Filled 2022-06-23: qty 1

## 2022-06-23 MED ORDER — EMPTY CONTAINERS FLEXIBLE MISC
900.0000 mg | Freq: Once | Status: AC
  Administered 2022-06-23: 900 mg via INTRAVENOUS
  Filled 2022-06-23: qty 90

## 2022-06-23 MED ORDER — ACETAMINOPHEN 500 MG PO TABS
1000.0000 mg | ORAL_TABLET | Freq: Once | ORAL | Status: AC
  Administered 2022-06-23: 1000 mg via ORAL
  Filled 2022-06-23: qty 2

## 2022-06-23 MED ORDER — ONDANSETRON 4 MG PO TBDP: 4.0000 mg | ORAL_TABLET | Freq: Three times a day (TID) | ORAL | 0 refills | Status: DC | PRN

## 2022-06-23 NOTE — ED Notes (Signed)
Pt transfers to this facility after fall last night, pt reports drinking a bottle of wine, taking his Ativan, and Tramadol last night. Reports waking up with blood on his pillow. Does not remember falling.

## 2022-06-23 NOTE — ED Notes (Signed)
Lab notified to add PT/INR

## 2022-06-23 NOTE — ED Notes (Signed)
Report given to A Rosie Place CN

## 2022-06-23 NOTE — ED Notes (Signed)
[facility]

## 2022-06-23 NOTE — ED Provider Notes (Signed)
  Physical Exam  BP (!) 158/75   Pulse 77   Temp 98.1 F (36.7 C) (Oral)   Resp 16   Ht '6\' 1"'$  (1.854 m)   Wt 104.3 kg   SpO2 96%   BMI 30.34 kg/m   Physical Exam  Procedures  Procedures  ED Course / MDM   Clinical Course as of 06/23/22 2357  Thu Jun 23, 2022  0912 Received a call from radiology that patient has a possible intraventricular hemorrhage versus colloid cyst.  Awaiting hardcopy reading before paging neurosurgery. [MB]  1003 Patient states he took his last dose of Eliquis last evening, did not take a dose today.  Discussed with Dr. Glenford Peers neurosurgery.  He asked that the patient's start receiving some reversal medication.  He will review imaging and get back to me with final disposition. [MB]  1029 Dr. Glenford Peers reviewed imaging.  He felt the patient would benefit from reversal and transfer to [facility] ED.  Needs repeat CT head in 6 to 8 hours.  If unchanged can likely be discharged.  If worsening would need medicine admission.  [MB]  1030 Patient accepted in transfer by Dr. Laverta Baltimore the physician at [facility]. [MB]  7846 Patient updated on plan and comfortable.  He said he is nauseous and a little anxious so ordered him some Zofran and some Ativan. [MB]  9629 Follow-up repeat head CT and call neurosurgery/ anticoagulation recs. [VB]  1932 Per Mcdaniel of neurosurgery: If patient is neurologically intact and feeling well, he would be safe to be discharged home and holding Eliquis for the next 2 weeks with follow-up with neurosurgery in 2 weeks with repeat head CT.  He is safe to take Tylenol and ibuprofen. [VB]  2118 I have assessed patient and had a long conversation with his wife and patient at bedside.  Patient was able to ambulate with holding onto the wall and he has a walker at home.  He had no falls.  He feels some lightheadedness and nausea.  We discussed holding off Eliquis for the next 2 weeks but calling his cardiologist tomorrow to let them know the plan.  Advise close follow-up  with PCP for physical therapy referral.  Neurosurgery will arrange for his follow-up in 2 weeks for repeat head CT and outpatient follow-up.  They recommend holding off Eliquis for 2 weeks.  I am discharging with Zofran and he will take home tramadol for pain relief.  Strict return precaution discussed.  They are in agreement and understand plan. [VB]    Clinical Course User Index [MB] Hayden Rasmussen, MD [VB] Elgie Congo, MD   Medical Decision Making Amount and/or Complexity of Data Reviewed Labs: ordered. Radiology: ordered.  Risk OTC drugs. Prescription drug management.         Elgie Congo, MD 06/23/22 [phone]

## 2022-06-23 NOTE — ED Triage Notes (Addendum)
Pt brought in by wife Pt reports he got up approx 11pm to go to [facility], but does not remember this until he got up from the fall. Pt endorses a glass of wine, tramodoland ativan that he took close to bedtime. States he spaced these meds out. Hx of ortho static hypotension and is Type 2 DM, Pt is on blood thinner for afib. Noted to have dried blood on posterior head. Nauseated and dizzy at this time

## 2022-06-23 NOTE — ED Notes (Signed)
ED TO INPATIENT HANDOFF REPORT  ED Nurse Name and Phone #:   Berneta Sages RN  S Name/Age/Gender Lawrence [name]. 71 y.o. male Room/Bed: MH06/MH06  Code Status   Code Status: Prior  Home/SNF/Other Home Patient oriented to: self, place, time, and situation Is this baseline? Yes   Triage Complete: Triage complete  Chief Complaint [place]  Triage Note Pt brought in by wife Pt reports he got up approx 11pm to go to [facility], but does not remember this until he got up from the fall. Pt endorses a glass of wine, tramodoland ativan that he took close to bedtime. States he spaced these meds out. Hx of ortho static hypotension and is Type 2 DM, Pt is on blood thinner for afib. Noted to have dried blood on posterior head. Nauseated and dizzy at this time   Allergies Allergies  Allergen Reactions   Chlorthalidone     Other reaction(s): urinary urgency   Lisinopril     Other reaction(s): cough   Metformin Hcl     Other reaction(s): fecal urgency    Level of Care/Admitting Diagnosis ED Disposition     None       B Medical/Surgery History Past Medical History:  Diagnosis Date   Bradycardia    GERD (gastroesophageal reflux disease)    H/O hiatal hernia    s/p  repair 06-26-2012   History of esophagectomy 08-21-2012  AT [facility]   History of kidney stones 2006   History of malignant neoplasm of esophagus ONCOLOGIST-  DR D'AMICO AT [facility]-- PER LAST NOTE 01/ 2018  NO RECURRENCE   dx 10/ 2013  Stage 2A (T2 N0)  08-21-2012 s/p  esophagectomy w/ gastric pull-through [facility]) post residual recurrent anastomotic stricture (multiple EGD w/ dilations)   History of transient ischemic attack (TIA) 2003   no residual   Hypertension    Long term (current) use of anticoagulants    xarelto   On dofetilide therapy    PAF (paroxysmal atrial fibrillation) ([place]) first dx 2009   primary cardiologist-  dr Daneen Schick /  EP cardiologist -- dr Lurene Shadow ([facility])  s/p  afib ablation 08-02-2016    Pancreatic cyst    x2    Peyronie's disease    Presence of permanent cardiac pacemaker implanted 05-01-2014  at [facility] follows   Pulmonary nodule, right    upper and middle lobes --  per last CT stable   Type 2 diabetes mellitus treated with insulin ([place])    Wears glasses    Past Surgical History:  Procedure Laterality Date   ANTERIOR CERVICAL DECOMP/DISCECTOMY FUSION N/A 06/02/2016   Procedure: ANTERIOR CERVICAL DECOMPRESSION FUSION CERVICAL 5-6, CERVICAL 6-7 WITH INSTRUMENTATION AND ALLOGRAFT;  Surgeon: Phylliss Bob, MD;  Location: [place];  Service: Orthopedics;  Laterality: N/A;  ANTERIOR CERVICAL DECOMPRESSION FUSION CERVICAL 5-6, CERVICAL 6-7 WITH INSTRUMENTATION AND ALLOGRAFT   BIOPSY  06/26/2012   Procedure: BIOPSY;  Surgeon: Madilyn Hook, DO;  Location: [facility];  Service: General;;   CARDIAC ELECTROPHYSIOLOGY STUDY AND ABLATION  08-02-2016   [facility]   (dr Lurene Shadow)   pulmonary veins isolation and ablation afib   CARDIAC PACEMAKER PLACEMENT  05-01-2014   [facility]   CHOLECYSTECTOMY  1988   COLONOSCOPY WITH PROPOFOL N/A 07/13/2015   Procedure: COLONOSCOPY WITH PROPOFOL;  Surgeon: Garlan Fair, MD;  [facility];  Service: Endoscopy;  Laterality: N/A;   ESOPHAGECTOMY  08-21-2012   [facility]   w/ gastric pull-through  ESOPHAGOGASTRODUODENOSCOPY (EGD) WITH ESOPHAGEAL DILATION  multiple x32 per pt approx.--- last one 02-09-2015   recurrent anastomotic stricture post esophagectomy   HIATAL HERNIA REPAIR  06/26/2012   Procedure: LAPAROSCOPIC REPAIR OF HIATAL HERNIA;  Surgeon: Madilyn Hook, DO;  Location[facility];  Service: General;;   [place]  06/26/2012   Procedure: HERNIA REPAIR INCISIONAL;  Surgeon: Madilyn Hook, DO;  Location[facility];  Service: General;;   INGUINAL HERNIA REPAIR Bilateral 1987   JEJUNOSTOMY FEEDING TUBE  12/22/2012   Removed 12/ 2016  approx.   KNEE ARTHROSCOPY Left 1990   MASS EXCISION Left 04/04/2013   Procedure:  EXCISION OF SCALP MASS;  Surgeon: Madilyn Hook, DO;  Location: [facility];  Service: General;  Laterality: Left;   pilomatrixoma   NESBIT PROCEDURE N/A 12/09/2016   Procedure: NESBIT PROCEDURE 16 DOT PLICATION;  Surgeon: Kathie Rhodes, MD;  Location: [place];  Service: Urology;  Laterality: N/A;   TONSILLECTOMY  1960   TRANSTHORACIC ECHOCARDIOGRAM  08-06-2015   [facility]   mild LVH, ef 50%/  mild RAE /  moderate LAE/  trivial AR, MR, PR,  and TR     A IV Location/Drains/Wounds Patient Lines/Drains/Airways Status     Active Line/Drains/Airways     Name Placement date Placement time Site Days   Peripheral IV 06/23/22 20 G 1" Left Antecubital 06/23/22  0848  Antecubital  less than 1   Urethral Catheter OTTELIN Latex 16 Fr. 12/09/16  0741  Latex  2022   Incision 06/26/12 Abdomen Other (Comment) 06/26/12  1359  -- 3649   Incision 04/04/13 Head Other (Comment) 04/04/13  1526  -- 3367   Incision (Closed) 06/02/16 Neck 06/02/16  0834  -- 2212   Incision (Closed) 12/09/16 Penis Other (Comment) 12/09/16  0805  -- 2022   Incision - 5 Ports Abdomen 1: Superior 2: Right;Upper 3: Right;Lower 4: Left;Upper 5: Left;Lower 06/26/12  --  -- 3649            Intake/Output Last 24 hours No intake or output data in the 24 hours ending 06/23/22 1021  Labs/Imaging Results for orders placed or performed during the hospital encounter of 06/23/22 (from the past 48 hour(s))  Basic metabolic panel     Status: Abnormal   Collection Time: 06/23/22  8:45 AM  Result Value Ref Range   Sodium 132 (L) 135 - 145 mmol/L   Potassium 5.1 3.5 - 5.1 mmol/L   Chloride 99 98 - 111 mmol/L   CO2 27 22 - 32 mmol/L   Glucose, Bld 183 (H) 70 - 99 mg/dL    Comment: Glucose reference range applies only to samples taken after fasting for at least 8 hours.   BUN 16 8 - 23 mg/dL   Creatinine, Ser 0.92 0.61 - 1.24 mg/dL   Calcium 9.4 8.9 - 10.3 mg/dL   GFR, Estimated >60 >60 mL/min    Comment: (NOTE) Calculated using  the CKD-EPI Creatinine Equation (2021)    Anion gap 6 5 - 15    Comment: Performed at [facility], [address] [place], [place] [address]  CBC with Differential     Status: Abnormal   Collection Time: 06/23/22  8:45 AM  Result Value Ref Range   WBC 7.4 4.0 - 10.5 K/uL   RBC 4.10 (L) 4.22 - 5.81 MIL/uL   Hemoglobin 13.9 13.0 - 17.0 g/dL   HCT 39.8 39.0 - 52.0 %   MCV 97.1 80.0 - 100.0  fL   MCH 33.9 26.0 - 34.0 pg   MCHC 34.9 30.0 - 36.0 g/dL   RDW 11.7 11.5 - 15.5 %   Platelets 178 150 - 400 K/uL   nRBC 0.0 0.0 - 0.2 %   Neutrophils Relative % 76 %   Neutro Abs 5.7 1.7 - 7.7 K/uL   Lymphocytes Relative 14 %   Lymphs Abs 1.0 0.7 - 4.0 K/uL   Monocytes Relative 8 %   Monocytes Absolute 0.6 0.1 - 1.0 K/uL   Eosinophils Relative 1 %   Eosinophils Absolute 0.1 0.0 - 0.5 K/uL   Basophils Relative 1 %   Basophils Absolute 0.1 0.0 - 0.1 K/uL   Immature Granulocytes 0 %   Abs Immature Granulocytes 0.03 0.00 - 0.07 K/uL    Comment: Performed at [facility], [address] [place], [place] [address]   CT Head Wo Contrast  Result Date: 06/23/2022 CLINICAL DATA:  Head trauma, minor (Age >= 65y) EXAM: CT HEAD WITHOUT CONTRAST TECHNIQUE: Contiguous axial images were obtained from the base of the skull through the vertex without intravenous contrast. RADIATION DOSE REDUCTION: This exam was performed according to the departmental dose-optimization program which includes automated exposure control, adjustment of the mA and/or kV according to patient size and/or use of iterative reconstruction technique. COMPARISON:  None Available. FINDINGS: Brain: Small focus of hyperdensity along the septum pellucidum near the foramen of Monroe (for example series 4, image 41 and series 2, image 20). No evidence of mass lesion, midline shift or hydrocephalus. No evidence of acute large vascular territory infarct. Vascular: No hyperdense vessel. Skull: Left posterior scalp contusion  without acute calvarial fracture. Sinuses/Orbits: Right frontal osteoma. Otherwise, clear sinuses. No acute orbital findings. Other: No mastoid effusions. IMPRESSION: Small focus of hyperdensity along the septum pellucidum near the foramen of Monroe, concerning for small volume of acute intraventricular hemorrhage in the setting of trauma. Appearance is atypical for a colloid cyst. Findings discussed with Dr. Lynelle Doctor telephone at 9:11 AM. Electronically Signed   By: Margaretha Sheffield M.D.   On: 06/23/2022 09:13   DG Ribs Unilateral W/Chest Right  Result Date: 06/22/2022 CLINICAL DATA:  Pain to posterior right rib with abnormal uptake on radionuclide bone scan EXAM: RIGHT RIBS AND CHEST - 3+ VIEW COMPARISON:  Bone scan 06/15/2022 and radiographs 03/19/2019 FINDINGS: No fracture or other bone lesions are seen involving the ribs. There is no evidence of pneumothorax or pleural effusion. Both lungs are clear. Heart size and mediastinal contours are stable. The posterior right ninth rib is not well visualized and may be surgically absent. Dual lead pacemaker overlying the left chest wall. Cervical spine fusion. Surgical clips right upper quadrant. IMPRESSION: The posterior right ninth rib is not well visualized and may be surgically absent. If no history of surgery, CT is recommended for further evaluation. Electronically Signed   By: Placido Sou M.D.   On: 06/22/2022 23:01    Pending Labs Unresulted Labs (From admission, onward)     Start     Ordered   06/23/22 1000  Protime-INR  ONCE - STAT,   STAT        06/23/22 1003   06/23/22 1000  APTT  ONCE - STAT,   STAT        06/23/22 1003            Vitals/Pain Today's Vitals   06/23/22 0819 06/23/22 0830 06/23/22 0945 06/23/22 1000  BP: (!) 152/105 (!) 169/94 (!) 158/92 Marland Kitchen)  167/95  Pulse: 71 72 70 70  Resp: (!) '26 18 16 18  '$ Temp: 97.7 F (36.5 C)     SpO2: 99% 98% 100% 98%  Weight: 104.3 kg     Height: '6\' 1"'$  (1.854 m)     PainSc: 3         Isolation Precautions No active isolations  Medications Medications  coag fact Xa recombinant (ANDEXXA) low dose infusion 900 mg (has no administration in time range)  sterile water (preservative free) injection (has no administration in time range)  Coag Fact Xa Inactivated-zhzo (ANDEXXA) 200 MG injection (has no administration in time range)    Mobility walks with device High fall risk   Focused Assessments Neuro Assessment Handoff:  Swallow screen pass? Not a stroke Paced rhythm       Neuro Assessment: Exceptions to WDL Neuro Checks:      Last Documented NIHSS Modified Score:   Has TPA been given? No If patient is a Neuro Trauma and patient is going to OR before floor call report to [place] nurse: [phone] or [phone]   R Recommendations: See Admitting Provider Note  Report given to:   Additional Notes: Pt noted to have area of dried area of blood left posterior head

## 2022-06-23 NOTE — ED Notes (Signed)
Pt reports no longer takes amlodipine for BP

## 2022-06-23 NOTE — ED Provider Notes (Signed)
Patient transferred from that center.  Patient here after fall.  Patient takes Eliquis.  Was found to have a small head bleeding given andexxa.  Neurosurgery team has been consulted with Glenford Peers NP per Dr. Melina Copa with ED team.  Plan was to have a repeat head CT at the 8-hour mark around 5 PM to see if there is been any change in his CT scan.  If this scan is stable anticipate patient will be stable for discharge.  Patient to be handed off to oncoming ED staff with patient pending CT scan of the head and follow-up neurology recommendations following that scan.  Patient asymptomatic throughout my care.  Neuro exam is unremarkable.  This chart was dictated using voice recognition software.  Despite best efforts to proofread,  errors can occur which can change the documentation meaning.    Lennice Sites, DO 06/23/22 1256

## 2022-06-23 NOTE — ED Notes (Signed)
Signature pad not working. Pt states understanding of waiver

## 2022-06-23 NOTE — Discharge Instructions (Signed)
You were seen in the emergency department and discharged after evaluation by neurosurgery team and ED found to have a small head bleed that was stable and unchanged on your repeat head CT scan.  Neurosurgery recommends you stay off your blood thinners for the next 2 weeks.  Call your cardiologist tomorrow to let them know that you received Andexxa and have been given this recommendation.  Neurosurgery should arrange for your repeat head CT scan in 2 weeks.  Mental and physical rest are very important during the first days and weeks after a concussion to give yourself the best chance at a full and quick recovery.    If you are experiencing a headache, it is a sign that you are exerting themselves too much and too early in their recovery.  For the next 10-14 days, you should gradually re-introduce yourself into your normal routine, one step at a time.  You can take Tylenol 1 g every 6-8 hours and ibuprofen 400 mg every 6 hours for pain.  You can also take your tramadol as needed but do not take while drinking alcohol and do not take with your lorazepam.  Make sure to rest and continue to use your walker while moving around.    Mental rest includes avoiding reading books, watching TV, or even listening to music if it gives you a headache.  Start by staying in bed or on the couch tomorrow, and gradually try to become more active throughout the day.  The same instructions are true for physical rest.  You should avoid any activity that worsens your headache, even if it simply walking fast.   Each day, you can try to become more active, but if you develop a headache, return to your reduced activity level from the day prior.  It is extremely important to avoid any activities that put yourself at risk for another head injury, especially including sports, gym class, or bike riding.  Another head injury during this critical recovery period can cause significant brain injury.   Return to the emergency  department if you develops any new and concerning symptoms including:  One pupil (the black part in the middle of the eye) larger than the other  Drowsiness or cannot be awakened  A new headache that gets worse and does not go away  Weakness, numbness, or decreased coordination  Repeated vomiting or nausea  Slurred speech  Convulsions or seizures  Difficulty recognizing people or places  Increasing confusion, restlessness, or agitation  Unusual behavior

## 2022-06-23 NOTE — ED Notes (Signed)
Pt c/o headache, requesting to have BG checked, eat, and get Ativan. MD informed.

## 2022-06-23 NOTE — ED Provider Notes (Signed)
[facility] EMERGENCY DEPARTMENT Provider Note   CSN: [id number] Arrival date & time: 06/23/22  2202     History  Chief Complaint  Patient presents with   Ludwig Lean. is a 71 y.o. male.  He is here for evaluation of head injury and dizziness.  He thinks he must of fell last evening when he went to the bathroom.  He does not recall the fall but remembers being in bed with his head hurting and some blood on the pillow.  He said he had had some wine along with the trazodone and Ativan, has a history of orthostatic hypotension.  Figures he must of passed out.  Woke up this morning with posterior headache and swelling along with dizziness.  No blurry vision chest pain shortness of breath abdominal pain.  No neck or back pain.  No focal numbness or weakness.  He is on blood thinners for A-fib and has a pacemaker.  The history is provided by the patient.  Fall This is a new problem. The current episode started 6 to 12 hours ago. The problem has been resolved. Associated symptoms include headaches. Pertinent negatives include no chest pain, no abdominal pain and no shortness of breath. Nothing aggravates the symptoms. Nothing relieves the symptoms. He has tried rest for the symptoms. The treatment provided no relief.       Home Medications Prior to Admission medications   Medication Sig Start Date End Date Taking? Authorizing Provider  insulin NPH Human (NOVOLIN N) 100 UNIT/ML injection 30 Units at bedtime. 01/21/19  Yes [provider]  insulin regular (NOVOLIN R) 100 units/mL injection Inject 18 Units into the skin 3 (three) times daily. 08/05/21 08/05/22 Yes [provider]  Insulin Syringe-Needle U-100 (BD VEO INSULIN SYRINGE U/F) 31G X 15/64" 0.3 ML MISC in the morning, at noon, in the evening, and at bedtime.   Yes [provider]  LORazepam (ATIVAN) 1 MG tablet Place 1 mg into feeding tube 2 (two) times daily.   Yes [provider]  metoprolol tartrate (LOPRESSOR) 25 MG tablet Take 1 tablet (25 mg total) by mouth 2 (two) times daily. 12/06/21  Yes Belva Crome, MD  acetaminophen (TYLENOL) 325 MG tablet Take 1 tablet by mouth as needed.    [provider]  amLODipine (NORVASC) 5 MG tablet Take 1 tablet (5 mg total) by mouth every Monday, Wednesday, and Friday. 10/06/21   Belva Crome, MD  ELIQUIS 5 MG TABS tablet TAKE 1 TABLET BY MOUTH TWICE A DAY 03/03/22   Belva Crome, MD  ferrous sulfate 325 (65 FE) MG tablet Take 1 tablet by mouth daily.    [provider]  fludrocortisone (FLORINEF) 0.1 MG tablet Take 1 tablet (0.1 mg total) by mouth daily. At bedtime. 04/04/22   Belva Crome, MD  FLUoxetine (PROZAC) 20 MG tablet Take 20 mg by mouth daily. Pt takes 3 tablet 60 mg once a day.    [provider]  pantoprazole (PROTONIX) 40 MG tablet Take 40 mg by mouth daily.    [provider]  Polyethyl Glycol-Propyl Glycol 0.4-0.3 % SOLN 1 drop at bedtime as needed. 1 drop at bedtime as needed.    [provider]  polyethylene glycol (MIRALAX / GLYCOLAX) 17 g packet 1 packet daily. 08/19/21   [provider]  traMADol (ULTRAM) 50 MG tablet Take 1 tablet by mouth daily at 12 noon.    [provider]  vitamin B-12 (CYANOCOBALAMIN) 1000 MCG tablet Place 1,000 mcg into feeding tube every other day. Taking Mon, Wed, Fri    [provider]      Allergies    Chlorthalidone, Lisinopril, and Metformin hcl    Review of Systems   Review of Systems  Constitutional:  Negative for fever.  Eyes:  Negative for visual disturbance.  Respiratory:  Negative for shortness of breath.   Cardiovascular:  Negative for chest pain.  Gastrointestinal:  Negative for abdominal pain.  Musculoskeletal:  Negative for neck pain.  Neurological:  Positive for dizziness and headaches.    Physical Exam Updated Vital Signs BP (!) 152/105   Pulse 71   Temp 97.7 F (36.5 C)   Resp (!)  26   Ht '6\' 1"'$  (1.854 m)   Wt 104.3 kg   SpO2 99%   BMI 30.34 kg/m  Physical Exam Vitals and nursing note reviewed.  Constitutional:      General: He is not in acute distress.    Appearance: Normal appearance. He is well-developed.  HENT:     Head: Normocephalic.     Comments: 4 cm hematoma posterior scalp with some abrasion no active bleeding Eyes:     Conjunctiva/sclera: Conjunctivae normal.  Cardiovascular:     Rate and Rhythm: Normal rate and regular rhythm.     Heart sounds: No murmur heard. Pulmonary:     Effort: Pulmonary effort is normal. No respiratory distress.     Breath sounds: Normal breath sounds.  Abdominal:     Palpations: Abdomen is soft.     Tenderness: There is no abdominal tenderness. There is no guarding or rebound.  Musculoskeletal:        General: Normal range of motion.     Cervical back: Neck supple. No tenderness.  Skin:    General: Skin is warm and dry.     Capillary Refill: Capillary refill takes less than 2 seconds.  Neurological:     General: No focal deficit present.     Mental Status: He is alert.     Sensory: No sensory deficit.     Motor: No weakness.     Gait: Gait normal.     ED Results / Procedures / Treatments   Labs (all labs ordered are listed, but only abnormal results are displayed) Labs Reviewed  BASIC METABOLIC PANEL - Abnormal; Notable for the following components:      Result Value   Sodium 132 (*)    Glucose, Bld 183 (*)    All other components within normal limits  CBC WITH DIFFERENTIAL/PLATELET - Abnormal; Notable for the following components:   RBC 4.10 (*)    All other components within normal limits  CBG MONITORING, ED - Abnormal; Notable for the following components:   Glucose-Capillary 173 (*)    All other components within normal limits  PROTIME-INR  APTT    EKG EKG Interpretation  Date/Time:  Thursday June 23 2022 08:18:40 EDT Ventricular Rate:  71 PR Interval:  167 QRS Duration: 93 QT  Interval:  393 QTC Calculation: 428 R Axis:   -2 Text Interpretation: Sinus rhythm vs paced Probable anteroseptal infarct, old Borderline repolarization abnormality Confirmed by Aletta Edouard [phone]) on 06/23/2022 8:21:13 AM  Radiology DG Ribs Unilateral W/Chest Right  Result Date: 06/22/2022 CLINICAL DATA:  Pain to posterior right rib with abnormal uptake on radionuclide bone scan EXAM: RIGHT RIBS AND CHEST - 3+ VIEW COMPARISON:  Bone scan 06/15/2022 and radiographs 03/19/2019 FINDINGS: No  fracture or other bone lesions are seen involving the ribs. There is no evidence of pneumothorax or pleural effusion. Both lungs are clear. Heart size and mediastinal contours are stable. The posterior right ninth rib is not well visualized and may be surgically absent. Dual lead pacemaker overlying the left chest wall. Cervical spine fusion. Surgical clips right upper quadrant. IMPRESSION: The posterior right ninth rib is not well visualized and may be surgically absent. If no history of surgery, CT is recommended for further evaluation. Electronically Signed   By: Placido Sou M.D.   On: 06/22/2022 23:01    Procedures .Critical Care  Performed by: Hayden Rasmussen, MD Authorized by: Hayden Rasmussen, MD   Critical care provider statement:    Critical care time (minutes):  45   Critical care time was exclusive of:  Separately billable procedures and treating other patients   Critical care was necessary to treat or prevent imminent or life-threatening deterioration of the following conditions:  CNS failure or compromise   Critical care was time spent personally by me on the following activities:  Development of treatment plan with patient or surrogate, discussions with consultants, evaluation of patient's response to treatment, examination of patient, obtaining history from patient or surrogate, ordering and performing treatments and interventions, ordering and review of laboratory studies, ordering and  review of radiographic studies, pulse oximetry, re-evaluation of patient's condition and review of old charts   I assumed direction of critical care for this patient from another provider in my specialty: no       Medications Ordered in ED Medications  sterile water (preservative free) injection (has no administration in time range)  Coag Fact Xa Inactivated-zhzo (ANDEXXA) 200 MG injection (has no administration in time range)  metoprolol tartrate (LOPRESSOR) tablet 25 mg (25 mg Oral Given 06/23/22 1147)  coag fact Xa recombinant (ANDEXXA) low dose infusion 900 mg (0 mg Intravenous Stopped 06/23/22 1300)  ondansetron (ZOFRAN) injection 4 mg (4 mg Intravenous Given 06/23/22 1047)  LORazepam (ATIVAN) injection 0.5 mg (0.5 mg Intravenous Given 06/23/22 1051)  LORazepam (ATIVAN) tablet 1 mg (1 mg Oral Given 06/23/22 1401)    ED Course/ Medical Decision Making/ A&P Clinical Course as of 06/23/22 1757  Thu Jun 23, 2022  0912 Received a call from radiology that patient has a possible intraventricular hemorrhage versus colloid cyst.  Awaiting hardcopy reading before paging neurosurgery. [MB]  1003 Patient states he took his last dose of Eliquis last evening, did not take a dose today.  Discussed with Dr. Glenford Peers neurosurgery.  He asked that the patient's start receiving some reversal medication.  He will review imaging and get back to me with final disposition. [MB]  1029 Dr. Glenford Peers reviewed imaging.  He felt the patient would benefit from reversal and transfer to [facility] ED.  Needs repeat CT head in 6 to 8 hours.  If unchanged can likely be discharged.  If worsening would need medicine admission.  [MB]  1030 Patient accepted in transfer by Dr. Laverta Baltimore the physician at [facility]. [MB]  6256 Patient updated on plan and comfortable.  He said he is nauseous and a little anxious so ordered him some Zofran and some Ativan. [MB]  3893 Follow-up repeat head CT and call neurosurgery/ anticoagulation recs. [VB]     Clinical Course User Index [MB] Hayden Rasmussen, MD [VB] Elgie Congo, MD  Medical Decision Making Amount and/or Complexity of Data Reviewed Labs: ordered. Radiology: ordered.  Risk Prescription drug management.   This patient complains of headache and dizziness after a fall and head injury; this involves an extensive number of treatment Options and is a complaint that carries with it a high risk of complications and morbidity. The differential includes bleed, contusion, concussion, stroke, fracture  I ordered, reviewed and interpreted labs, which included CBC normal chemistries normal other than mildly low sodium elevated glucose I ordered medication IV Zofran IV and IV reversal agent for Eliquis and reviewed PMP when indicated. I ordered imaging studies which included CT head and I independently    visualized and interpreted imaging which showed intraventricular hemorrhage Additional history obtained from patient's wife Previous records obtained and reviewed in epic no recent admissions I consulted Dr. Glenford Peers neurosurgery and discussed lab and imaging findings and discussed disposition.  Cardiac monitoring reviewed, normal sinus rhythm Social determinants considered, no significant barriers Critical Interventions: Reversal of anticoagulation for intracranial bleed  After the interventions stated above, I reevaluated the patient and found patient to be awake and alert minimally symptomatic Admission and further testing considered, he will be transferred over to [facility] ED for further observation and repeat imaging.  Patient in agreement with plan.         Final Clinical Impression(s) / ED Diagnoses Final diagnoses:  Ventricular hemorrhage ([place])  Fall, initial encounter    Rx / DC Orders ED Discharge Orders     None         Hayden Rasmussen, MD 06/23/22 1759

## 2022-06-23 NOTE — ED Notes (Signed)
Pt and wife aware of pending transfer to [facility] and consent

## 2022-06-24 DIAGNOSIS — E782 Mixed hyperlipidemia: Secondary | ICD-10-CM | POA: Diagnosis not present

## 2022-06-24 DIAGNOSIS — I10 Essential (primary) hypertension: Secondary | ICD-10-CM | POA: Diagnosis not present

## 2022-06-24 DIAGNOSIS — K219 Gastro-esophageal reflux disease without esophagitis: Secondary | ICD-10-CM | POA: Diagnosis not present

## 2022-06-24 DIAGNOSIS — G4700 Insomnia, unspecified: Secondary | ICD-10-CM | POA: Diagnosis not present

## 2022-06-24 DIAGNOSIS — H35033 Hypertensive retinopathy, bilateral: Secondary | ICD-10-CM | POA: Diagnosis not present

## 2022-06-24 DIAGNOSIS — N400 Enlarged prostate without lower urinary tract symptoms: Secondary | ICD-10-CM | POA: Diagnosis not present

## 2022-06-24 DIAGNOSIS — E1142 Type 2 diabetes mellitus with diabetic polyneuropathy: Secondary | ICD-10-CM | POA: Diagnosis not present

## 2022-06-27 DIAGNOSIS — E1142 Type 2 diabetes mellitus with diabetic polyneuropathy: Secondary | ICD-10-CM | POA: Diagnosis not present

## 2022-06-27 DIAGNOSIS — I4819 Other persistent atrial fibrillation: Secondary | ICD-10-CM | POA: Diagnosis not present

## 2022-06-27 DIAGNOSIS — W19XXXS Unspecified fall, sequela: Secondary | ICD-10-CM | POA: Diagnosis not present

## 2022-06-27 DIAGNOSIS — E113293 Type 2 diabetes mellitus with mild nonproliferative diabetic retinopathy without macular edema, bilateral: Secondary | ICD-10-CM | POA: Diagnosis not present

## 2022-06-27 DIAGNOSIS — Z8501 Personal history of malignant neoplasm of esophagus: Secondary | ICD-10-CM | POA: Diagnosis not present

## 2022-06-27 DIAGNOSIS — Y92009 Unspecified place in unspecified non-institutional (private) residence as the place of occurrence of the external cause: Secondary | ICD-10-CM | POA: Diagnosis not present

## 2022-06-27 DIAGNOSIS — I615 Nontraumatic intracerebral hemorrhage, intraventricular: Secondary | ICD-10-CM | POA: Diagnosis not present

## 2022-06-27 DIAGNOSIS — Z7901 Long term (current) use of anticoagulants: Secondary | ICD-10-CM | POA: Diagnosis not present

## 2022-06-28 DIAGNOSIS — E1165 Type 2 diabetes mellitus with hyperglycemia: Secondary | ICD-10-CM | POA: Diagnosis not present

## 2022-07-04 DIAGNOSIS — Z6830 Body mass index (BMI) 30.0-30.9, adult: Secondary | ICD-10-CM | POA: Diagnosis not present

## 2022-07-04 DIAGNOSIS — I629 Nontraumatic intracranial hemorrhage, unspecified: Secondary | ICD-10-CM | POA: Diagnosis not present

## 2022-07-12 ENCOUNTER — Telehealth (HOSPITAL_BASED_OUTPATIENT_CLINIC_OR_DEPARTMENT_OTHER): Payer: Self-pay

## 2022-07-12 ENCOUNTER — Ambulatory Visit (HOSPITAL_BASED_OUTPATIENT_CLINIC_OR_DEPARTMENT_OTHER)
Admission: RE | Admit: 2022-07-12 | Discharge: 2022-07-12 | Disposition: A | Discharge status: Home / Self Care | Payer: Medicare PPO | Source: Ambulatory Visit | Attending: Neurosurgery | Admitting: Neurosurgery

## 2022-07-12 ENCOUNTER — Other Ambulatory Visit (HOSPITAL_BASED_OUTPATIENT_CLINIC_OR_DEPARTMENT_OTHER): Payer: Self-pay | Admitting: Neurosurgery

## 2022-07-12 DIAGNOSIS — I629 Nontraumatic intracranial hemorrhage, unspecified: Secondary | ICD-10-CM | POA: Diagnosis not present

## 2022-07-12 DIAGNOSIS — I672 Cerebral atherosclerosis: Secondary | ICD-10-CM | POA: Diagnosis not present

## 2022-07-12 DIAGNOSIS — I619 Nontraumatic intracerebral hemorrhage, unspecified: Secondary | ICD-10-CM | POA: Diagnosis not present

## 2022-07-14 ENCOUNTER — Other Ambulatory Visit: Payer: Self-pay | Admitting: Interventional Cardiology

## 2022-07-14 DIAGNOSIS — I480 Paroxysmal atrial fibrillation: Secondary | ICD-10-CM

## 2022-07-14 NOTE — Telephone Encounter (Signed)
Prescription refill request for Eliquis received. Indication: Afib  Last office visit: 04/04/22 Tamala Julian)  Scr: 0.92 (06/23/22) Age: 71 Weight: 104.3kg  Appropriate dose and refill sent to requested pharmacy.

## 2022-07-15 DIAGNOSIS — I629 Nontraumatic intracranial hemorrhage, unspecified: Secondary | ICD-10-CM | POA: Diagnosis not present

## 2022-08-11 DIAGNOSIS — I4819 Other persistent atrial fibrillation: Secondary | ICD-10-CM | POA: Diagnosis not present

## 2022-08-11 DIAGNOSIS — Z45018 Encounter for adjustment and management of other part of cardiac pacemaker: Secondary | ICD-10-CM | POA: Diagnosis not present

## 2022-08-12 DIAGNOSIS — E1165 Type 2 diabetes mellitus with hyperglycemia: Secondary | ICD-10-CM | POA: Diagnosis not present

## 2022-08-17 DIAGNOSIS — I10 Essential (primary) hypertension: Secondary | ICD-10-CM | POA: Diagnosis not present

## 2022-08-17 DIAGNOSIS — K219 Gastro-esophageal reflux disease without esophagitis: Secondary | ICD-10-CM | POA: Diagnosis not present

## 2022-08-17 DIAGNOSIS — E782 Mixed hyperlipidemia: Secondary | ICD-10-CM | POA: Diagnosis not present

## 2022-08-17 DIAGNOSIS — E1142 Type 2 diabetes mellitus with diabetic polyneuropathy: Secondary | ICD-10-CM | POA: Diagnosis not present

## 2022-08-17 DIAGNOSIS — F331 Major depressive disorder, recurrent, moderate: Secondary | ICD-10-CM | POA: Diagnosis not present

## 2022-09-01 DIAGNOSIS — E1165 Type 2 diabetes mellitus with hyperglycemia: Secondary | ICD-10-CM | POA: Diagnosis not present

## 2022-09-01 DIAGNOSIS — Z794 Long term (current) use of insulin: Secondary | ICD-10-CM | POA: Diagnosis not present

## 2022-09-08 ENCOUNTER — Telehealth: Payer: Self-pay

## 2022-09-08 NOTE — Patient Instructions (Signed)
Visit Information  Thank you for taking time to visit with me today. Please don't hesitate to contact me if I can be of assistance to you.   Following are the goals we discussed today:   Goals Addressed             This Visit's Progress    COMPLETED: Care Coordination Activities - no follow up required       Care Coordination Interventions: Provided education to patient re: care coordination services Assessed social determinant of health barriers           If you are experiencing a Mental Health or [place] or need someone to talk to, please call the Suicide and Crisis Lifeline: 988 call the [place] National Suicide Prevention Lifeline: (321) 316-7000 or TTY: 8181777615 TTY 601-883-9966) to talk to a trained counselor call 1-800-273-TALK (toll free, 24 hour hotline) go to [facility] [place] [phone]) call 911   The patient verbalized understanding of instructions, educational materials, and care plan provided today and DECLINED offer to receive copy of patient instructions, educational materials, and care plan.   No further follow up required:    Peter Garter RN, Jackquline Denmark, [facility] [phone]

## 2022-09-08 NOTE — Patient Outreach (Signed)
  Care Coordination   Initial Visit Note   09/08/2022 Name: Lawrence [name]. MRN: [id number] DOB: Nov 21, 1950  Lawrence [name]. is a 72 y.o. year old male who sees Schwartzman, Laurian Brim, MD for primary care. I spoke with  Lawrence [name]. by phone today.  What matters to the patients health and wellness today?  No concerns today.  States he saw his doctor yesterday.      Goals Addressed             This Visit's Progress    COMPLETED: Care Coordination Activities - no follow up required       Care Coordination Interventions: Provided education to patient re: care coordination services Assessed social determinant of health barriers          SDOH assessments and interventions completed:  Yes  SDOH Interventions Today    Flowsheet Row Most Recent Value  SDOH Interventions   Food Insecurity Interventions Intervention Not Indicated  Housing Interventions Intervention Not Indicated  Transportation Interventions Intervention Not Indicated  Utilities Interventions Intervention Not Indicated        Care Coordination Interventions:  Yes, provided   Follow up plan: No further intervention required.   Encounter Outcome:  Pt. Visit Completed  Peter Garter RN, BSN,CCM, CDE Care Management Coordinator [facility] [phone]

## 2022-11-01 ENCOUNTER — Ambulatory Visit: Payer: Medicare PPO | Admitting: Cardiology

## 2022-11-15 LAB — HM DIABETES EYE EXAM

## 2022-12-06 DIAGNOSIS — Z9181 History of falling: Secondary | ICD-10-CM | POA: Diagnosis not present

## 2022-12-06 DIAGNOSIS — R531 Weakness: Secondary | ICD-10-CM | POA: Diagnosis not present

## 2022-12-06 DIAGNOSIS — R2681 Unsteadiness on feet: Secondary | ICD-10-CM | POA: Diagnosis not present

## 2022-12-06 DIAGNOSIS — M47896 Other spondylosis, lumbar region: Secondary | ICD-10-CM | POA: Diagnosis not present

## 2022-12-09 DIAGNOSIS — Z9181 History of falling: Secondary | ICD-10-CM | POA: Diagnosis not present

## 2022-12-09 DIAGNOSIS — R531 Weakness: Secondary | ICD-10-CM | POA: Diagnosis not present

## 2022-12-09 DIAGNOSIS — R262 Difficulty in walking, not elsewhere classified: Secondary | ICD-10-CM | POA: Diagnosis not present

## 2022-12-15 DIAGNOSIS — R531 Weakness: Secondary | ICD-10-CM | POA: Diagnosis not present

## 2022-12-15 DIAGNOSIS — R262 Difficulty in walking, not elsewhere classified: Secondary | ICD-10-CM | POA: Diagnosis not present

## 2022-12-15 DIAGNOSIS — Z9181 History of falling: Secondary | ICD-10-CM | POA: Diagnosis not present

## 2022-12-20 DIAGNOSIS — Z9181 History of falling: Secondary | ICD-10-CM | POA: Diagnosis not present

## 2022-12-20 DIAGNOSIS — R531 Weakness: Secondary | ICD-10-CM | POA: Diagnosis not present

## 2022-12-20 DIAGNOSIS — R262 Difficulty in walking, not elsewhere classified: Secondary | ICD-10-CM | POA: Diagnosis not present

## 2022-12-22 DIAGNOSIS — R531 Weakness: Secondary | ICD-10-CM | POA: Diagnosis not present

## 2022-12-22 DIAGNOSIS — Z9181 History of falling: Secondary | ICD-10-CM | POA: Diagnosis not present

## 2022-12-22 DIAGNOSIS — R262 Difficulty in walking, not elsewhere classified: Secondary | ICD-10-CM | POA: Diagnosis not present

## 2022-12-26 ENCOUNTER — Telehealth: Payer: Self-pay | Admitting: Internal Medicine

## 2022-12-26 DIAGNOSIS — R531 Weakness: Secondary | ICD-10-CM | POA: Diagnosis not present

## 2022-12-26 DIAGNOSIS — R262 Difficulty in walking, not elsewhere classified: Secondary | ICD-10-CM | POA: Diagnosis not present

## 2022-12-26 DIAGNOSIS — Z9181 History of falling: Secondary | ICD-10-CM | POA: Diagnosis not present

## 2022-12-26 NOTE — Telephone Encounter (Signed)
That is ok.  Please advise the patient, until he has his first visit here , will have to seek advice from his previous PCP office

## 2022-12-26 NOTE — Telephone Encounter (Signed)
Pt came in office stating that his PCP has retired and is wanting to establish with our office with Dr Drue Novel who was recommended to him. Is it ok to est pt as NP with Dr Drue Novel?

## 2022-12-26 NOTE — Telephone Encounter (Signed)
Okay to schedule NP appt.  

## 2022-12-26 NOTE — Telephone Encounter (Signed)
Please advise 

## 2022-12-28 NOTE — Progress Notes (Unsigned)
HPI: FU atrial fibrillation. Previously followed by Dr Katrinka Blazing. Also followed at [facility]. Had pacemaker (2015) and atrial fibrillation ablation (2017) at [facility]. Carotid dopplers 2017 showed <50 bilaterally. Echo 8/20 showed normal LV function, moderate LVH, grade 2 DD. Cardiac CTA 9/20 showed Ca score 357 (73 percentile); mild disease in the RCA, LAD; moderate disease in the RI and Lcx. Recurrent atrial fibrillation requiring DCCV 12/22. Larey Seat 10/23 (felt to be orthostatic mediated) and had head trauma with small bleed. Apixaban held. Since last seen, patient states that over the past 4 months he has had worsening dyspnea on exertion.  He has dyspnea with minimal activities.  No orthopnea, PND, pedal edema, exertional chest pain or syncope.  Current Outpatient Medications  Medication Sig Dispense Refill   ELIQUIS 5 MG TABS tablet TAKE 1 TABLET BY MOUTH TWICE A DAY 180 tablet 1   ferrous sulfate 325 (65 FE) MG tablet Take 325 mg by mouth daily.     FLUoxetine (PROZAC) 20 MG tablet Take 60 mg by mouth daily.     Homeopathic Products (ARNICARE ARNICA) OINT Apply 1 Application topically daily.     HUMULIN 70/30 (70-30) 100 UNIT/ML injection Inject 8-16 Units into the skin See admin instructions. Inject 8-12 units into the skin every morning, and 10-16 units into the skin every night. *adjust based on BGC reading     Insulin Syringe-Needle U-100 (BD VEO INSULIN SYRINGE U/F) 31G X 15/64" 0.3 ML MISC in the morning, at noon, in the evening, and at bedtime.     KLOR-CON M20 20 MEQ tablet Take 10 mEq by mouth 2 (two) times daily.     LORazepam (ATIVAN) 1 MG tablet Take 1 mg by mouth 2 (two) times daily as needed for anxiety or sleep.     metoprolol tartrate (LOPRESSOR) 25 MG tablet Take 1 tablet (25 mg total) by mouth 2 (two) times daily. 180 tablet 3   Multiple Vitamins-Minerals (PRESERVISION AREDS PO) Take 1 tablet by mouth daily.     Polyethyl Glycol-Propyl Glycol 0.4-0.3 % SOLN Place 1 drop into both eyes  at bedtime as needed (dry eyes, itching). 1 drop at bedtime as needed.     rosuvastatin (CRESTOR) 5 MG tablet Take 5 mg by mouth daily.     traMADol (ULTRAM) 50 MG tablet Take 50 mg by mouth daily at 12 noon.     vitamin B-12 (CYANOCOBALAMIN) 1000 MCG tablet Take 1,000 mcg by mouth every other day.     acetaminophen (TYLENOL) 325 MG tablet Take 650 mg by mouth every 6 (six) hours as needed for moderate pain. (Patient not taking: Reported on 12/29/2022)     ondansetron (ZOFRAN-ODT) 4 MG disintegrating tablet Take 1 tablet (4 mg total) by mouth every 8 (eight) hours as needed for nausea or vomiting. (Patient not taking: Reported on 12/29/2022) 20 tablet 0   No current facility-administered medications for this visit.     Past Medical History:  Diagnosis Date   Bradycardia    GERD (gastroesophageal reflux disease)    H/O hiatal hernia    s/p  repair 06-26-2012   History of esophagectomy 08-21-2012  AT [facility]   History of kidney stones 2006   History of malignant neoplasm of esophagus ONCOLOGIST-  DR D'AMICO AT [facility]-- PER LAST NOTE 01/ 2018  NO RECURRENCE   dx 10/ 2013  Stage 2A (T2 N0)  08-21-2012 s/p  esophagectomy w/ gastric pull-through [facility]) post residual recurrent anastomotic stricture (multiple EGD w/ dilations)  History of transient ischemic attack (TIA) 2003   no residual   Hypertension    Long term (current) use of anticoagulants    xarelto   On dofetilide therapy    PAF (paroxysmal atrial fibrillation) first dx 2009   primary cardiologist-  dr Verdis Prime /  EP cardiologist -- dr Alden Hipp ([facility])  s/p  afib ablation 08-02-2016   Pancreatic cyst    x2    Peyronie's disease    Presence of permanent cardiac pacemaker implanted 05-01-2014  at [facility]   Dr.Daubert -[facility] follows   Pulmonary nodule, right    upper and middle lobes --  per last CT stable   Type 2 diabetes mellitus treated with insulin    Wears glasses     Past Surgical History:  Procedure Laterality  Date   ANTERIOR CERVICAL DECOMP/DISCECTOMY FUSION N/A 06/02/2016   Procedure: ANTERIOR CERVICAL DECOMPRESSION FUSION CERVICAL 5-6, CERVICAL 6-7 WITH INSTRUMENTATION AND ALLOGRAFT;  Surgeon: Estill Bamberg, MD;  Location: [facility] OR;  Service: Orthopedics;  Laterality: N/A;  ANTERIOR CERVICAL DECOMPRESSION FUSION CERVICAL 5-6, CERVICAL 6-7 WITH INSTRUMENTATION AND ALLOGRAFT   BIOPSY  06/26/2012   Procedure: BIOPSY;  Surgeon: Lodema Pilot, DO;  Location: [facility] ORS;  Service: General;;   CARDIAC ELECTROPHYSIOLOGY STUDY AND ABLATION  08-02-2016   [facility]   (dr Alden Hipp)   pulmonary veins isolation and ablation afib   CARDIAC PACEMAKER PLACEMENT  05-01-2014   [facility]   CHOLECYSTECTOMY  1988   COLONOSCOPY WITH PROPOFOL N/A 07/13/2015   Procedure: COLONOSCOPY WITH PROPOFOL;  Surgeon: Charolett Bumpers, MD;  Location: [facility] ENDOSCOPY;  Service: Endoscopy;  Laterality: N/A;   ESOPHAGECTOMY  08-21-2012   [facility]   w/ gastric pull-through   ESOPHAGOGASTRODUODENOSCOPY (EGD) WITH ESOPHAGEAL DILATION  multiple x32 per pt approx.--- last one 02-09-2015   recurrent anastomotic stricture post esophagectomy   HIATAL HERNIA REPAIR  06/26/2012   Procedure: LAPAROSCOPIC REPAIR OF HIATAL HERNIA;  Surgeon: Lodema Pilot, DO;  Location: [facility] ORS;  Service: General;;   INCISIONAL HERNIA REPAIR  06/26/2012   Procedure: HERNIA REPAIR INCISIONAL;  Surgeon: Lodema Pilot, DO;  Location: [facility] ORS;  Service: General;;   INGUINAL HERNIA REPAIR Bilateral 1987   JEJUNOSTOMY FEEDING TUBE  12/22/2012   Removed 12/ 2016  approx.   KNEE ARTHROSCOPY Left 1990   MASS EXCISION Left 04/04/2013   Procedure: EXCISION OF SCALP MASS;  Surgeon: Lodema Pilot, DO;  Location: [facility] ORS;  Service: General;  Laterality: Left;   pilomatrixoma   NESBIT PROCEDURE N/A 12/09/2016   Procedure: NESBIT PROCEDURE 16 DOT PLICATION;  Surgeon: Ihor Gully, MD;  Location: [facility] [place];  Service: Urology;  Laterality: N/A;   TONSILLECTOMY  1960   TRANSTHORACIC  ECHOCARDIOGRAM  08-06-2015   [facility]   mild LVH, ef 50%/  mild RAE /  moderate LAE/  trivial AR, MR, PR,  and TR    Social History   Socioeconomic History   Marital status: Married    Spouse name: Not on file   Number of children: Not on file   Years of education: Not on file   Highest education level: Not on file  Occupational History   Not on file  Tobacco Use   Smoking status: Never   Smokeless tobacco: Never  Substance and Sexual Activity   Alcohol use: Yes    Comment: socially   Drug use: No   Sexual activity: Not on file  Other Topics Concern   Not on file  Social History  Narrative   Not on file   Social Determinants of Health   Financial Resource Strain: Not on file  Food Insecurity: No Food Insecurity (09/08/2022)   Hunger Vital Sign    Worried About Running Out of Food in the Last Year: Never true    Ran Out of Food in the Last Year: Never true  Transportation Needs: No Transportation Needs (09/08/2022)   PRAPARE - Administrator, Civil Service (Medical): No    Lack of Transportation (Non-Medical): No  Physical Activity: Not on file  Stress: Not on file  Social Connections: Not on file  Intimate Partner Violence: Not on file    Family History  Problem Relation Age of Onset   Cancer Father        kidney    ROS: no fevers or chills, productive cough, hemoptysis, dysphasia, odynophagia, melena, hematochezia, dysuria, hematuria, rash, seizure activity, orthopnea, PND, pedal edema, claudication. Remaining systems are negative.  Physical Exam: Well-developed well-nourished in no acute distress.  Skin is warm and dry.  HEENT is normal.  Neck is supple.  Chest is clear to auscultation with normal expansion.  Cardiovascular exam is regular rate and rhythm.  Abdominal exam nontender or distended. No masses palpated. Extremities show no edema. neuro grossly intact  ECG-atrial paced rhythm, nonspecific ST changes.  Personally reviewed  A/P  1 PAF-pt  is s/p ablation but noted to have some recurrences on device interrogation at [facility].  He is in an atrial paced rhythm today.  Will continue metoprolol for rate control if atrial fibrillation recurs.  Continue apixaban.  Check hemoglobin.  2 CAD-continue statin.  No aspirin given need for apixaban.  Note patient has significant dyspnea on exertion.  He also has 30 years of diabetes mellitus.  I wonder if he is having an anginal equivalent.  We will arrange cardiac CTA to rule out obstructive coronary disease.  Will repeat echocardiogram to reassess LV function.  3 h/o orthostasis-had syncopal episode 10/23 likely orthostatic medicated; will repeat echo; no recurrences.  Will allow blood pressure to run higher given history of orthostasis.  4 [facility]  5 H/O TIA  6 hypertension-BP elevated; I have asked him to track this at home.  Note he has also had orthostatic hypotension and we will allow his blood pressure to run higher.  7 hyperlipidemia-given documented coronary disease will increase Crestor to 40 mg daily.  In 8 weeks check lipids and liver.  Olga Millers, MD

## 2022-12-29 ENCOUNTER — Encounter: Payer: Self-pay | Admitting: Cardiology

## 2022-12-29 ENCOUNTER — Ambulatory Visit: Payer: Medicare PPO | Attending: Cardiology | Admitting: Cardiology

## 2022-12-29 VITALS — BP 142/98 | HR 78 | Ht 73.0 in | Wt 239.6 lb

## 2022-12-29 DIAGNOSIS — I951 Orthostatic hypotension: Secondary | ICD-10-CM

## 2022-12-29 DIAGNOSIS — R0609 Other forms of dyspnea: Secondary | ICD-10-CM

## 2022-12-29 DIAGNOSIS — I10 Essential (primary) hypertension: Secondary | ICD-10-CM | POA: Diagnosis not present

## 2022-12-29 DIAGNOSIS — E785 Hyperlipidemia, unspecified: Secondary | ICD-10-CM

## 2022-12-29 DIAGNOSIS — I2510 Atherosclerotic heart disease of native coronary artery without angina pectoris: Secondary | ICD-10-CM | POA: Diagnosis not present

## 2022-12-29 MED ORDER — ROSUVASTATIN CALCIUM 40 MG PO TABS
40.0000 mg | ORAL_TABLET | Freq: Every day | ORAL | 3 refills | Status: DC
Start: 2022-12-29 — End: 2023-01-25

## 2022-12-29 MED ORDER — ROSUVASTATIN CALCIUM 40 MG PO TABS: 40.0000 mg | ORAL_TABLET | Freq: Every day | ORAL | 3 refills | Status: DC

## 2022-12-29 NOTE — Addendum Note (Signed)
Addended by: Freddi Starr on: 12/29/2022 08:59 AM   Modules accepted: Orders

## 2022-12-29 NOTE — Telephone Encounter (Signed)
Called pt LVM to ok schedule NP appt. Approved by provider.

## 2022-12-29 NOTE — Patient Instructions (Signed)
Medication Instructions:   INCREASE ROSUVASTATIN TO 40 MG ONCE DAILY=8 OF THE 5 MG TABLETS ONCE DAILY  *If you need a refill on your cardiac medications before your next appointment, please call your pharmacy*   Lab Work:  Your physician recommends that you return for lab work in: [facility]  If you have labs (blood work) drawn today and your tests are completely normal, you will receive your results only by: MyChart Message (if you have MyChart) OR A paper copy in the mail If you have any lab test that is abnormal or we need to change your treatment, we will call you to review the results.   Testing/Procedures:  Your physician has requested that you have an echocardiogram. Echocardiography is a painless test that uses sound waves to create images of your heart. It provides your doctor with information about the size and shape of your heart and how well your heart's chambers and valves are working. This procedure takes approximately one hour. There are no restrictions for this procedure. Please do NOT wear cologne, perfume, aftershave, or lotions (deodorant is allowed). Please arrive 15 minutes prior to your appointment time. [address]     Your cardiac CT will be scheduled at    [facility] [address] [place], [place] [address] [phone]   If scheduled at [facility], please arrive at the [facility] and Children's Entrance (Entrance C2) of [facility] 30 minutes prior to test start time. You can use the FREE valet parking offered at entrance C (encouraged to control the heart rate for the test)  Proceed to the [facility] Radiology Department (first floor) to check-in and test prep.  All radiology patients and guests should use entrance C2 at [facility], accessed from [facility], even though the hospital's physical address listed is [address]      Please follow these instructions  carefully (unless otherwise directed):  Hold all erectile dysfunction medications at least 3 days (72 hrs) prior to test. (Ie viagra, cialis, sildenafil, tadalafil, etc) We will administer nitroglycerin during this exam.   On the Night Before the Test: Be sure to Drink plenty of water. Do not consume any caffeinated/decaffeinated beverages or chocolate 12 hours prior to your test. Do not take any antihistamines 12 hours prior to your test.   On the Day of the Test: Drink plenty of water until 1 hour prior to the test. Do not eat any food 1 hour prior to test. You may take your regular medications prior to the test.  Take metoprolol (Lopressor) 100 MG  two hours prior to test=4 OF THE 25 MG TABLETS ONCE DAILY       After the Test: Drink plenty of water. After receiving IV contrast, you may experience a mild flushed feeling. This is normal. On occasion, you may experience a mild rash up to 24 hours after the test. This is not dangerous. If this occurs, you can take Benadryl 25 mg and increase your fluid intake. If you experience trouble breathing, this can be serious. If it is severe call 911 IMMEDIATELY. If it is mild, please call our office.   We will call to schedule your test 2-4 weeks out understanding that some insurance companies will need an authorization prior to the service being performed.   For non-scheduling related questions, please contact the cardiac imaging nurse navigator should you have any questions/concerns: Rockwell Alexandria, Cardiac Imaging Nurse Navigator Larey Brick,  Cardiac Imaging Nurse Navigator [facility] Direct Office Dial: [phone]   For scheduling needs, including cancellations and rescheduling, please call [place], [phone].    Follow-Up: At [facility], you and your health needs are our priority.  As part of our continuing mission to provide you with exceptional heart care, we have created designated  Provider Care Teams.  These Care Teams include your primary Cardiologist (physician) and Advanced Practice Providers (APPs -  Physician Assistants and Nurse Practitioners) who all work together to provide you with the care you need, when you need it.  We recommend signing up for the patient portal called "MyChart".  Sign up information is provided on this After Visit Summary.  MyChart is used to connect with patients for Virtual Visits (Telemedicine).  Patients are able to view lab/test results, encounter notes, upcoming appointments, etc.  Non-urgent messages can be sent to your provider as well.   To learn more about what you can do with MyChart, go to [email or web address].    Your next appointment:    AS SCHEDULED

## 2022-12-30 ENCOUNTER — Telehealth: Payer: Self-pay | Admitting: *Deleted

## 2022-12-30 DIAGNOSIS — E875 Hyperkalemia: Secondary | ICD-10-CM

## 2022-12-30 LAB — BASIC METABOLIC PANEL
BUN/Creatinine Ratio: 13 (ref 10–24)
BUN: 13 mg/dL (ref 8–27)
CO2: 24 mmol/L (ref 20–29)
Calcium: 9.7 mg/dL (ref 8.6–10.2)
Chloride: 98 mmol/L (ref 96–106)
Creatinine, Ser: 1.02 mg/dL (ref 0.76–1.27)
Glucose: 141 mg/dL — ABNORMAL HIGH (ref 70–99)
Potassium: 5.7 mmol/L — ABNORMAL HIGH (ref 3.5–5.2)
Sodium: 136 mmol/L (ref 134–144)
eGFR: 79 mL/min/{1.73_m2} (ref >59)

## 2022-12-30 LAB — CBC
Hematocrit: 44.2 % (ref 37.5–51.0)
Hemoglobin: 14.4 g/dL (ref 13.0–17.7)
MCH: 33.5 pg — ABNORMAL HIGH (ref 26.6–33.0)
MCHC: 32.6 g/dL (ref 31.5–35.7)
MCV: 103 fL — ABNORMAL HIGH (ref 79–97)
Platelets: 209 10*3/uL (ref 150–450)
RBC: 4.3 x10E6/uL (ref 4.14–5.80)
RDW: 11.8 % (ref 11.6–15.4)
WBC: 9.5 10*3/uL (ref 3.4–10.8)

## 2022-12-30 NOTE — Telephone Encounter (Signed)
Left message for pt to call.

## 2022-12-30 NOTE — Telephone Encounter (Signed)
-----   Message from Lewayne Bunting, MD sent at 12/30/2022  7:46 AM EDT ----- DC Kdur; BMET one week Olga Millers

## 2022-12-30 NOTE — Telephone Encounter (Signed)
Spoke with pt, Aware of dr crenshaw's recommendations.  Lab orders mailed to the pt  

## 2023-01-03 ENCOUNTER — Telehealth (HOSPITAL_COMMUNITY): Payer: Self-pay | Admitting: *Deleted

## 2023-01-03 NOTE — Telephone Encounter (Signed)
Patient calling about his upcoming cardiac imaging study; pt verbalizes understanding of appt date/time, parking situation and where to check in, pre-test NPO status and medications ordered, and verified current allergies; name and call back number provided for further questions should they arise  Larey Brick RN Navigator Cardiac Imaging [facility] [phone] office [phone] cell  Patient to take 100mg  metoprolol tartrate two hours prior to his cardiac CT scan.  He is aware to arrive at 8am.

## 2023-01-04 DIAGNOSIS — E1165 Type 2 diabetes mellitus with hyperglycemia: Secondary | ICD-10-CM | POA: Diagnosis not present

## 2023-01-05 DIAGNOSIS — E875 Hyperkalemia: Secondary | ICD-10-CM | POA: Diagnosis not present

## 2023-01-05 DIAGNOSIS — E785 Hyperlipidemia, unspecified: Secondary | ICD-10-CM | POA: Diagnosis not present

## 2023-01-05 LAB — BASIC METABOLIC PANEL
BUN/Creatinine Ratio: 10 (ref 10–24)
BUN: 13 mg/dL (ref 8–27)
CO2: 21 mmol/L (ref 20–29)
Calcium: 9.1 mg/dL (ref 8.6–10.2)
Chloride: 96 mmol/L (ref 96–106)
Creatinine, Ser: 1.24 mg/dL (ref 0.76–1.27)
Glucose: 235 mg/dL — ABNORMAL HIGH (ref 70–99)
Potassium: 4.7 mmol/L (ref 3.5–5.2)
Sodium: 136 mmol/L (ref 134–144)
eGFR: 62 mL/min/{1.73_m2} (ref >59)

## 2023-01-06 ENCOUNTER — Ambulatory Visit (HOSPITAL_COMMUNITY)
Admission: RE | Admit: 2023-01-06 | Discharge: 2023-01-06 | Disposition: A | Discharge status: Home / Self Care | Payer: Medicare PPO | Source: Ambulatory Visit | Attending: Cardiology | Admitting: Cardiology

## 2023-01-06 ENCOUNTER — Ambulatory Visit (HOSPITAL_COMMUNITY)
Admission: RE | Admit: 2023-01-06 | Discharge: 2023-01-06 | Disposition: A | Discharge status: Home / Self Care | Payer: Medicare PPO | Source: Ambulatory Visit | Attending: Cardiovascular Disease | Admitting: Cardiovascular Disease

## 2023-01-06 ENCOUNTER — Other Ambulatory Visit: Payer: Self-pay | Admitting: *Deleted

## 2023-01-06 ENCOUNTER — Other Ambulatory Visit: Payer: Self-pay | Admitting: Cardiovascular Disease

## 2023-01-06 DIAGNOSIS — I2510 Atherosclerotic heart disease of native coronary artery without angina pectoris: Secondary | ICD-10-CM | POA: Diagnosis not present

## 2023-01-06 DIAGNOSIS — R931 Abnormal findings on diagnostic imaging of heart and coronary circulation: Secondary | ICD-10-CM | POA: Insufficient documentation

## 2023-01-06 DIAGNOSIS — R748 Abnormal levels of other serum enzymes: Secondary | ICD-10-CM

## 2023-01-06 DIAGNOSIS — R0609 Other forms of dyspnea: Secondary | ICD-10-CM | POA: Diagnosis not present

## 2023-01-06 DIAGNOSIS — K449 Diaphragmatic hernia without obstruction or gangrene: Secondary | ICD-10-CM | POA: Diagnosis not present

## 2023-01-06 DIAGNOSIS — E119 Type 2 diabetes mellitus without complications: Secondary | ICD-10-CM | POA: Diagnosis not present

## 2023-01-06 LAB — HEPATIC FUNCTION PANEL
ALT: 43 IU/L (ref 0–44)
AST: 51 IU/L — ABNORMAL HIGH (ref 0–40)
Albumin: 3.9 g/dL (ref 3.8–4.8)
Alkaline Phosphatase: 160 IU/L — ABNORMAL HIGH (ref 44–121)
Bilirubin Total: 0.6 mg/dL (ref 0.0–1.2)
Bilirubin, Direct: 0.22 mg/dL (ref 0.00–0.40)
Total Protein: 6.3 g/dL (ref 6.0–8.5)

## 2023-01-06 LAB — LIPID PANEL
Chol/HDL Ratio: 2.8 ratio (ref 0.0–5.0)
Cholesterol, Total: 118 mg/dL (ref 100–199)
HDL: 42 mg/dL (ref >39)
LDL Chol Calc (NIH): 44 mg/dL (ref 0–99)
Triglycerides: 197 mg/dL — ABNORMAL HIGH (ref 0–149)
VLDL Cholesterol Cal: 32 mg/dL (ref 5–40)

## 2023-01-06 MED ORDER — NITROGLYCERIN 0.4 MG SL SUBL
SUBLINGUAL_TABLET | SUBLINGUAL | Status: AC
  Filled 2023-01-06: qty 2

## 2023-01-06 MED ORDER — IOHEXOL 350 MG/ML SOLN
95.0000 mL | Freq: Once | INTRAVENOUS | Status: AC | PRN
  Administered 2023-01-06: 95 mL via INTRAVENOUS

## 2023-01-06 MED ORDER — METOPROLOL TARTRATE 5 MG/5ML IV SOLN
INTRAVENOUS | Status: AC
  Filled 2023-01-06: qty 10

## 2023-01-06 MED ORDER — NITROGLYCERIN 0.4 MG SL SUBL
0.8000 mg | SUBLINGUAL_TABLET | Freq: Once | SUBLINGUAL | Status: AC
  Administered 2023-01-06: 0.8 mg via SUBLINGUAL

## 2023-01-06 NOTE — Progress Notes (Signed)
CT FFR ordered.   Gerri Spore T. Flora Lipps, MD, [facility]  [facility]  [address], [address] [place], [place] [address] [phone]  1:26 PM

## 2023-01-06 NOTE — Progress Notes (Signed)
hepat

## 2023-01-18 ENCOUNTER — Ambulatory Visit (HOSPITAL_BASED_OUTPATIENT_CLINIC_OR_DEPARTMENT_OTHER)
Admission: RE | Admit: 2023-01-18 | Discharge: 2023-01-18 | Disposition: A | Discharge status: Home / Self Care | Payer: Medicare PPO | Source: Ambulatory Visit | Attending: Cardiology | Admitting: Cardiology

## 2023-01-18 DIAGNOSIS — R0609 Other forms of dyspnea: Secondary | ICD-10-CM | POA: Insufficient documentation

## 2023-01-18 LAB — ECHOCARDIOGRAM COMPLETE
AR max vel: 1.51 cm2
AV Area VTI: 1.6 cm2
AV Area mean vel: 1.46 cm2
AV Mean grad: 9 mmHg
AV Peak grad: 14.3 mmHg
Ao pk vel: 1.89 m/s
Area-P 1/2: 3.95 cm2
S' Lateral: 2.3 cm

## 2023-01-20 ENCOUNTER — Encounter: Payer: Self-pay | Admitting: Internal Medicine

## 2023-01-20 DIAGNOSIS — K219 Gastro-esophageal reflux disease without esophagitis: Secondary | ICD-10-CM

## 2023-01-20 DIAGNOSIS — N400 Enlarged prostate without lower urinary tract symptoms: Secondary | ICD-10-CM | POA: Insufficient documentation

## 2023-01-20 DIAGNOSIS — F419 Anxiety disorder, unspecified: Secondary | ICD-10-CM | POA: Insufficient documentation

## 2023-01-20 DIAGNOSIS — G3184 Mild cognitive impairment, so stated: Secondary | ICD-10-CM | POA: Insufficient documentation

## 2023-01-20 DIAGNOSIS — K589 Irritable bowel syndrome without diarrhea: Secondary | ICD-10-CM

## 2023-01-20 DIAGNOSIS — H3530 Unspecified macular degeneration: Secondary | ICD-10-CM

## 2023-01-20 DIAGNOSIS — I615 Nontraumatic intracerebral hemorrhage, intraventricular: Secondary | ICD-10-CM

## 2023-01-20 DIAGNOSIS — I495 Sick sinus syndrome: Secondary | ICD-10-CM | POA: Insufficient documentation

## 2023-01-20 HISTORY — DX: Anxiety disorder, unspecified: F41.9

## 2023-01-25 ENCOUNTER — Other Ambulatory Visit: Payer: Self-pay

## 2023-01-25 DIAGNOSIS — E785 Hyperlipidemia, unspecified: Secondary | ICD-10-CM

## 2023-02-01 ENCOUNTER — Ambulatory Visit: Payer: Medicare PPO | Admitting: Internal Medicine

## 2023-02-06 DIAGNOSIS — E1165 Type 2 diabetes mellitus with hyperglycemia: Secondary | ICD-10-CM | POA: Diagnosis not present

## 2023-02-08 ENCOUNTER — Encounter: Payer: Self-pay | Admitting: Internal Medicine

## 2023-02-08 ENCOUNTER — Ambulatory Visit: Payer: Medicare PPO | Admitting: Internal Medicine

## 2023-02-08 VITALS — BP 138/90 | HR 73 | Temp 98.3°F | Resp 12 | Ht 73.0 in | Wt 235.2 lb

## 2023-02-08 DIAGNOSIS — E113219 Type 2 diabetes mellitus with mild nonproliferative diabetic retinopathy with macular edema, unspecified eye: Secondary | ICD-10-CM

## 2023-02-08 DIAGNOSIS — Z09 Encounter for follow-up examination after completed treatment for conditions other than malignant neoplasm: Secondary | ICD-10-CM

## 2023-02-08 DIAGNOSIS — Z794 Long term (current) use of insulin: Secondary | ICD-10-CM

## 2023-02-08 DIAGNOSIS — R748 Abnormal levels of other serum enzymes: Secondary | ICD-10-CM | POA: Diagnosis not present

## 2023-02-08 DIAGNOSIS — R7989 Other specified abnormal findings of blood chemistry: Secondary | ICD-10-CM

## 2023-02-08 DIAGNOSIS — F1020 Alcohol dependence, uncomplicated: Secondary | ICD-10-CM | POA: Diagnosis not present

## 2023-02-08 DIAGNOSIS — I10 Essential (primary) hypertension: Secondary | ICD-10-CM

## 2023-02-08 DIAGNOSIS — R269 Unspecified abnormalities of gait and mobility: Secondary | ICD-10-CM | POA: Diagnosis not present

## 2023-02-08 DIAGNOSIS — E1142 Type 2 diabetes mellitus with diabetic polyneuropathy: Secondary | ICD-10-CM

## 2023-02-08 DIAGNOSIS — E785 Hyperlipidemia, unspecified: Secondary | ICD-10-CM | POA: Diagnosis not present

## 2023-02-08 LAB — VITAMIN B12: Vitamin B-12: 1128 pg/mL — ABNORMAL HIGH (ref 211–911)

## 2023-02-08 LAB — HEMOGLOBIN A1C: Hgb A1c MFr Bld: 7.3 % — ABNORMAL HIGH (ref 4.6–6.5)

## 2023-02-08 LAB — FOLATE: Folate: 8.9 ng/mL (ref >5.9)

## 2023-02-08 NOTE — Assessment & Plan Note (Signed)
New patient, previous PCP Dr. Valentina Lucks retired Recent labs: 04-2022: A1c 7.3 05-2022: TSH 1.0 12/29/2022: Hemoglobin 14.4 01/05/2023: Creatinine 1.2, potassium 4.7. AST 51 slightly high, ALT 43 normal. Total cholesterol 118, LDL 44  DM: Currently on 70/30 insulin, follow-up at [facility], no recent A1c, request me to check it.  Will do HTN: No recent ambulatory BPs, BP today 130/90, current medications: Metoprolol.  No change, recommend to start checking at home High cholesterol: On Crestor, LDL satisfactory. Increased LFTs:Last AST 51, alkaline phosphatase 160.  Recheck labs today.  See next EtOH: Drinks 1 full bottle of wine daily, drinking heavily for the last 10 years ago after the diagnosis of esophageal cancer.  He knows is too much. EtOH could partially explain his increased LFTs.  We agreed to a drastic reduction to 1 glass at night. Gait disorder: This is his main concern, reports that back in 2022 had a prolonged admission at [facility], since then his balance is not the same, had frequent falls including one that happened at October 2023 which led to a small intraventricular hemorrhage. He did 2 rounds of PT, but the last one was stopped because DOE and diaphoresis, subsequently he saw cardiology. Plan: Go back to PT once cleared by cardiology, avoid   situations that could trigger falls, use a cane consistently. Neck pain: On tramadol, last prescription 01/06/2023, 60 tablets, takes on average 1 tablet a day.  Call for refill when needed.  Stated he is careful not mixing alcohol and tramadol. Increase MCV: See last CBC, could be alcohol effect versus vitamin deficiency, check a B12 and folic acid. Multiple records reviewed. RTC 6 weeks

## 2023-02-08 NOTE — Progress Notes (Signed)
Subjective:    Patient ID: Lawrence Forth., male    DOB: 12-Mar-1951, 72 y.o.   MRN: [id number]  DOS:  02/08/2023 Type of visit - description: New patient to get established, here with his wife  The patient has extensive past medical history which is reviewed today. Current concerns: Refill tramadol when appropriate Poor balance, options? Request general labs EtOH: Drinking 1 bottle of wine every day.   Review of Systems See above   Past Medical History:  Diagnosis Date   Anxiety 01/20/2023   Bradycardia    Depression    GERD (gastroesophageal reflux disease)    H/O hiatal hernia    s/p  repair 06-26-2012   History of esophagectomy 08-21-2012  AT [facility]   History of kidney stones 2006   History of malignant neoplasm of esophagus ONCOLOGIST-  DR D'AMICO AT [facility]-- PER LAST NOTE 01/ 2018  NO RECURRENCE   dx 10/ 2013  Stage 2A (T2 N0)  08-21-2012 s/p  esophagectomy w/ gastric pull-through [facility]) post residual recurrent anastomotic stricture (multiple EGD w/ dilations)   History of transient ischemic attack (TIA) 2003   no residual   Hypertension    Long term (current) use of anticoagulants    xarelto   On dofetilide therapy    PAF (paroxysmal atrial fibrillation) (HCC) first dx 2009   primary cardiologist-  dr Verdis Prime /  EP cardiologist -- dr Alden Hipp ([facility])  s/p  afib ablation 08-02-2016   Pancreatic cyst    x2    Peyronie's disease    Presence of permanent cardiac pacemaker implanted 05-01-2014  at [facility]   Dr.Daubert -[facility] follows   Pulmonary nodule, right    upper and middle lobes --  per last CT stable   Type 2 diabetes mellitus treated with insulin (HCC)    Wears glasses     Past Surgical History:  Procedure Laterality Date   ANTERIOR CERVICAL DECOMP/DISCECTOMY FUSION N/A 06/02/2016   Procedure: ANTERIOR CERVICAL DECOMPRESSION FUSION CERVICAL 5-6, CERVICAL 6-7 WITH INSTRUMENTATION AND ALLOGRAFT;  Surgeon: Estill Bamberg, MD;  Location: [facility] OR;   Service: Orthopedics;  Laterality: N/A;  ANTERIOR CERVICAL DECOMPRESSION FUSION CERVICAL 5-6, CERVICAL 6-7 WITH INSTRUMENTATION AND ALLOGRAFT   BIOPSY  06/26/2012   Procedure: BIOPSY;  Surgeon: Lodema Pilot, DO;  Location: [facility] ORS;  Service: General;;   CARDIAC ELECTROPHYSIOLOGY STUDY AND ABLATION  08-02-2016   [facility]   (dr Alden Hipp)   pulmonary veins isolation and ablation afib   CARDIAC PACEMAKER PLACEMENT  05-01-2014   [facility]   CHOLECYSTECTOMY  1983   COLONOSCOPY WITH PROPOFOL N/A 07/13/2015   Procedure: COLONOSCOPY WITH PROPOFOL;  Surgeon: Charolett Bumpers, MD;  Location: [facility] ENDOSCOPY;  Service: Endoscopy;  Laterality: N/A;   ESOPHAGECTOMY  08-21-2012   [facility]   w/ gastric pull-through   ESOPHAGOGASTRODUODENOSCOPY (EGD) WITH ESOPHAGEAL DILATION  multiple x32 per pt approx.--- last one 02-09-2015   recurrent anastomotic stricture post esophagectomy   HIATAL HERNIA REPAIR  06/26/2012   Procedure: LAPAROSCOPIC REPAIR OF HIATAL HERNIA;  Surgeon: Lodema Pilot, DO;  Location: [facility] OR[facility];  Service: General;;   INCISIONAL HERNIA REPAIR  06/26/2012   Procedure: HERNIA REPAIR INCISIONAL;  Surgeon: Lodema Pilot, DO;  Location: [facility] ORS;  Service: General;;   INGUINAL HERNIA REPAIR Bilateral 1982   JEJUNOSTOMY FEEDING TUBE  12/22/2012   Removed 12/ 2016  approx.   KNEE ARTHROSCOPY Left 1990   KNEE ARTHROSCOPY Right 12/2019   LAPAROSCOPIC GASTRIC SLEEVE RESECTION  06/2012  started but abandoned d/t discovery of tumor   LAPAROTOMY  07/12/2021   revision of previous gastric conduit, hiatal hernia repair   MASS EXCISION Left 04/04/2013   Procedure: EXCISION OF SCALP MASS;  Surgeon: Lodema Pilot, DO;  Location: [facility] OR[facility];  Service: General;  Laterality: Left;   pilomatrixoma   NESBIT PROCEDURE N/A 12/09/2016   Procedure: NESBIT PROCEDURE 16 DOT PLICATION;  Surgeon: Ihor Gully, MD;  Location: [facility];  Service: Urology;  Laterality: N/A;   THORACOTOMY  07/12/2021   revision of previous gastric  conduit   TONSILLECTOMY  1959   TRANSTHORACIC ECHOCARDIOGRAM  08-06-2015   [facility]   mild LVH, ef 50%/  mild RAE /  moderate LAE/  trivial AR, MR, PR,  and TR   Social History   Socioeconomic History   Marital status: Married    Spouse name: Not on file   Number of children: 4   Years of education: Not on file   Highest education level: Not on file  Occupational History   Occupation: retired, Medical illustrator, Designer, fashion/clothing  Tobacco Use   Smoking status: Never   Smokeless tobacco: Never  Substance and Sexual Activity   Alcohol use: Yes    Comment: socially   Drug use: Never   Sexual activity: Not on file  Other Topics Concern   Not on file  Social History Narrative   Not on file   Social Determinants of Health   Financial Resource Strain: Not on file  Food Insecurity: No Food Insecurity (09/08/2022)   Hunger Vital Sign    Worried About Running Out of Food in the Last Year: Never true    Ran Out of Food in the Last Year: Never true  Transportation Needs: No Transportation Needs (09/08/2022)   PRAPARE - Administrator, Civil Service (Medical): No    Lack of Transportation (Non-Medical): No  Physical Activity: Not on file  Stress: Not on file  Social Connections: Not on file  Intimate Partner Violence: Not on file   Breast Cancer-relatedfamily history is not on file.   Current Outpatient Medications  Medication Instructions   cyanocobalamin (VITAMIN B12) 1,000 mcg, Oral, Daily   ELIQUIS 5 MG TABS tablet TAKE 1 TABLET BY MOUTH TWICE A DAY   ferrous sulfate 325 mg, Daily   FLUoxetine (PROZAC) 60 mg, Oral, Daily   HUMULIN 70/30 (70-30) 100 UNIT/ML injection 12 Units, Subcutaneous, BH-q am and hs, <BR><BR>   Insulin Syringe-Needle U-100 (BD VEO INSULIN SYRINGE U/F) 31G X 15/64" 0.3 ML MISC 4 times daily   LORazepam (ATIVAN) 1 mg, Oral, 2 times daily PRN   Metoprolol Tartrate (FIRST - METOPROLOL) 10 MG/ML SOLN 1.25 mLs, Oral, 2 times daily   Multiple Vitamins-Minerals  (PRESERVISION AREDS PO) 1 tablet, Oral, Daily, Preservision AREDS   rosuvastatin (CRESTOR) 40 mg, Oral, Daily   traMADol (ULTRAM) 50 mg, Oral, At bedtime PRN       Objective:   Physical Exam BP (!) 138/90 (BP Location: Left Arm, Cuff Size: Normal)   Pulse 73   Temp 98.3 F (36.8 C) (Oral)   Resp 12   Ht 6\' 1"  (1.854 m)   Wt 235 lb 3.2 oz (106.7 kg)   SpO2 95%   BMI 31.03 kg/m  General:   Well developed, NAD, BMI noted.  HEENT:  Normocephalic . Face symmetric, atraumatic Lungs:  CTA B Normal respiratory effort, no intercostal retractions, no accessory muscle use. Heart: RRR,  no murmur.  Abdomen:  Not  distended, soft, non-tender. No rebound or rigidity.   Skin: Not pale. Not jaundice Lower extremities: no pretibial edema bilaterally  Neurologic:  alert & oriented X3.  Speech normal, gait appropriate for age and unassisted Psych--  Cognition and judgment appear intact.  Cooperative with normal attention span and concentration.  Behavior appropriate. No anxious or depressed appearing.     Assessment     Assessment (new patient, previous PCP retired) DM- per endo Mild nonproliferative diabetic retinopathy with macular edema. HTN High cholesterol Anxiety  GI: Esophageal neoplasm 2013 found during a gastric bypass  Hiatal hernia: R thoracotomy Laparotomy lysis of adhesions for repair of paraesophageal hernia in 2022  Cardiovascular: A-fib dx 2014, anticoagulated Pacemaker 2015, Ablation @ [facility] 2017 TIA 2003 ?  MRI negative, MRA some atherosclerosis distal R vertebral artery? MSK: see surgeries, on tramadol   PLAN New patient, previous PCP Dr. Valentina Lucks retired Recent labs: 04-2022: A1c 7.3 05-2022: TSH 1.0 12/29/2022: Hemoglobin 14.4 01/05/2023: Creatinine 1.2, potassium 4.7. AST 51 slightly high, ALT 43 normal. Total cholesterol 118, LDL 44  DM: Currently on 70/30 insulin, follow[facility]up at [facility], no recent A1c, request me to check it.  Will do HTN: No recent  ambulatory BPs, BP today 130/90, current medications: Metoprolol.  No change, recommend to start checking at home High cholesterol: On Crestor, LDL satisfactory. Increased LFTs:Last AST 51, alkaline phosphatase 160.  Recheck labs today.  See next EtOH: Drinks 1 full bottle of wine daily, drinking heavily for the last 10 years ago after the diagnosis of esophageal cancer.  He knows is too much. EtOH could partially explain his increased LFTs.  We agreed to a drastic reduction to 1 glass at night. Gait disorder: This is his main concern, reports that back in 2022 had a prolonged admission at [facility], since then his balance is not the same, had frequent falls including one that happened at October 2023 which led to a small intraventricular hemorrhage. He did 2 rounds of PT, but the last one was stopped because DOE and diaphoresis, subsequently he saw cardiology. Plan: Go back to PT once cleared by cardiology, avoid   situations that could trigger falls, use a cane consistently. Neck pain: On tramadol, last prescription 01/06/2023, 60 tablets, takes on average 1 tablet a day.  Call for refill when needed.  Stated he is careful not mixing alcohol and tramadol. Increase MCV: See last CBC, could be alcohol effect versus vitamin deficiency, check a B12 and folic acid. Multiple records reviewed. RTC 6 weeks  Time spent 45 minutes.  Extensive chart review.  Complicated PMH. Also listening therapy provided about EtOH and his desire to reduce intake. Counseled about gait disorder.

## 2023-02-08 NOTE — Patient Instructions (Addendum)
Check the  blood pressure regularly BP GOAL is between 110/65 and  135/85. If it is consistently higher or lower, let me know     GO TO THE LAB : Get the blood work     GO TO THE FRONT DESK, PLEASE SCHEDULE YOUR APPOINTMENTS Come back for   a checkup in 6 weeks

## 2023-02-09 LAB — HEPATIC FUNCTION PANEL
ALT: 34 U/L (ref 0–53)
AST: 43 U/L — ABNORMAL HIGH (ref 0–37)
Albumin: 4 g/dL (ref 3.5–5.2)
Alkaline Phosphatase: 145 U/L — ABNORMAL HIGH (ref 39–117)
Bilirubin, Direct: 0.2 mg/dL (ref 0.0–0.3)
Total Bilirubin: 0.7 mg/dL (ref 0.2–1.2)
Total Protein: 6.8 g/dL (ref 6.0–8.3)

## 2023-02-09 LAB — GAMMA GT: GGT: 136 U/L — ABNORMAL HIGH (ref 7–51)

## 2023-02-12 LAB — NUCLEOTIDASE, 5', BLOOD: 5-Nucleotidase: 3 U/L (ref 0–10)

## 2023-02-13 NOTE — Addendum Note (Signed)
Addended byConrad Moorcroft D on: 02/13/2023 07:54 AM   Modules accepted: Orders

## 2023-02-16 ENCOUNTER — Ambulatory Visit (HOSPITAL_BASED_OUTPATIENT_CLINIC_OR_DEPARTMENT_OTHER)
Admission: RE | Admit: 2023-02-16 | Discharge: 2023-02-16 | Disposition: A | Discharge status: Home / Self Care | Payer: Medicare PPO | Source: Ambulatory Visit | Attending: Internal Medicine | Admitting: Internal Medicine

## 2023-02-16 DIAGNOSIS — R7989 Other specified abnormal findings of blood chemistry: Secondary | ICD-10-CM | POA: Diagnosis not present

## 2023-02-16 DIAGNOSIS — R945 Abnormal results of liver function studies: Secondary | ICD-10-CM | POA: Diagnosis not present

## 2023-02-23 DIAGNOSIS — Z950 Presence of cardiac pacemaker: Secondary | ICD-10-CM | POA: Diagnosis not present

## 2023-03-01 ENCOUNTER — Ambulatory Visit: Payer: Medicare PPO | Admitting: Cardiology

## 2023-03-07 ENCOUNTER — Telehealth: Payer: Self-pay | Admitting: Internal Medicine

## 2023-03-07 MED ORDER — TRAMADOL HCL 50 MG PO TABS: 50.0000 mg | ORAL_TABLET | Freq: Every evening | ORAL | 0 refills | Status: DC | PRN

## 2023-03-07 MED ORDER — LORAZEPAM 1 MG PO TABS: 1.0000 mg | ORAL_TABLET | Freq: Two times a day (BID) | ORAL | 0 refills | Status: DC | PRN

## 2023-03-07 NOTE — Telephone Encounter (Signed)
Refill request for lorazepam and tramadol. His new patient appt w/ you was on 02/08/2023. I will get contract and UDS at his next OV on 03/24/23. Will you refill those to [facility] on [address] please?

## 2023-03-07 NOTE — Telephone Encounter (Signed)
PDMP okay, Rx sent, will discuss strategies to decrease benzodiazepines and pain medicine doses. Of note this, he has been on tramadol and SSRIs for a while without major problems.

## 2023-03-08 ENCOUNTER — Other Ambulatory Visit: Payer: Self-pay | Admitting: *Deleted

## 2023-03-08 DIAGNOSIS — I480 Paroxysmal atrial fibrillation: Secondary | ICD-10-CM

## 2023-03-08 MED ORDER — APIXABAN 5 MG PO TABS
5.0000 mg | ORAL_TABLET | Freq: Two times a day (BID) | ORAL | 1 refills | Status: DC
Start: 2023-03-08 — End: 2024-01-22

## 2023-03-08 NOTE — Telephone Encounter (Signed)
Eliquis 5mg  refill request received. Patient is 72 years old, weight-106.7kg, Crea-1.24 on 01/05/23, Diagnosis-Afib, and last seen by Dr. Jens Som on 12/29/22. Dose is appropriate based on dosing criteria. Will send in refill to requested pharmacy.

## 2023-03-20 NOTE — Progress Notes (Signed)
HPI: FU atrial fibrillation. Previously followed by Dr Katrinka Blazing. Also followed at [facility]. Had pacemaker (2015) and atrial fibrillation ablation (2017) at [facility]. Carotid dopplers 2017 showed <50 bilaterally. Recurrent atrial fibrillation requiring DCCV 12/22. Larey Seat 10/23 (felt to be orthostatic mediated) and had head trauma with small bleed. Apixaban held.  Coronary CTA May 2024 showed calcium score 1025 which was 84th percentile, moderate plaque in the LAD and ramus, otherwise mild plaque.  FFR was negative.  Echocardiogram May 2024 showed ejection fraction 55 to 60%, mild left ventricular hypertrophy, grade 2 diastolic dysfunction, mild aortic stenosis with mean gradient 9 mmHg.  Abdominal ultrasound June 2024 showed no abdominal aortic aneurysm.  Since last seen, he denies dyspnea, chest pain or syncope.  He continues to have dizziness with standing.  Current Outpatient Medications  Medication Sig Dispense Refill   apixaban (ELIQUIS) 5 MG TABS tablet Take 1 tablet (5 mg total) by mouth 2 (two) times daily. 180 tablet 1   ferrous sulfate 325 (65 FE) MG tablet 325 mg daily.     FLUoxetine (PROZAC) 20 MG tablet Take 60 mg by mouth daily.     HUMULIN 70/30 (70-30) 100 UNIT/ML injection Inject 12 Units into the skin 2 (two) times daily in the am and at bedtime..     Insulin Syringe-Needle U-100 (BD VEO INSULIN SYRINGE U/F) 31G X 15/64" 0.3 ML MISC in the morning, at noon, in the evening, and at bedtime.     LORazepam (ATIVAN) 1 MG tablet Take 1 tablet (1 mg total) by mouth 2 (two) times daily as needed for anxiety or sleep. 60 tablet 0   Metoprolol Tartrate (FIRST - METOPROLOL) 10 MG/ML SOLN Take 1.25 mLs by mouth in the morning and at bedtime.     Multiple Vitamins-Minerals (PRESERVISION AREDS PO) Take 1 tablet by mouth daily. Preservision AREDS     rosuvastatin (CRESTOR) 40 MG tablet Take 40 mg by mouth daily.     traMADol (ULTRAM) 50 MG tablet Take 1 tablet (50 mg total) by mouth at bedtime as needed  for moderate pain. 60 tablet 0   vitamin B-12 (CYANOCOBALAMIN) 1000 MCG tablet Take 1,000 mcg by mouth daily.     No current facility-administered medications for this visit.     Past Medical History:  Diagnosis Date   Anxiety 01/20/2023   Bradycardia    Depression    GERD (gastroesophageal reflux disease)    H/O hiatal hernia    s/p  repair 06-26-2012   History of esophagectomy 08-21-2012  AT [facility]   History of kidney stones 2006   History of malignant neoplasm of esophagus ONCOLOGIST-  DR D'AMICO AT [facility]-- PER LAST NOTE 01/ 2018  NO RECURRENCE   dx 10/ 2013  Stage 2A (T2 N0)  08-21-2012 s/p  esophagectomy w/ gastric pull-through [facility]) post residual recurrent anastomotic stricture (multiple EGD w/ dilations)   History of transient ischemic attack (TIA) 2003   no residual   Hypertension    Long term (current) use of anticoagulants    xarelto   On dofetilide therapy    PAF (paroxysmal atrial fibrillation) (HCC) first dx 2009   primary cardiologist-  dr Verdis Prime /  EP cardiologist -- dr Alden Hipp ([facility])  s/p  afib ablation 08-02-2016   Pancreatic cyst    x2    Peyronie's disease    Presence of permanent cardiac pacemaker implanted 05-01-2014  at [facility]   Dr.Daubert -[facility] follows   Pulmonary nodule, right  upper and middle lobes --  per last CT stable   Type 2 diabetes mellitus treated with insulin (HCC)    Wears glasses     Past Surgical History:  Procedure Laterality Date   ANTERIOR CERVICAL DECOMP/DISCECTOMY FUSION N/A 06/02/2016   Procedure: ANTERIOR CERVICAL DECOMPRESSION FUSION CERVICAL 5-6, CERVICAL 6-7 WITH INSTRUMENTATION AND ALLOGRAFT;  Surgeon: Estill Bamberg, MD;  Location: [facility];  Service: Orthopedics;  Laterality: N/A;  ANTERIOR CERVICAL DECOMPRESSION FUSION CERVICAL 5-6, CERVICAL 6-7 WITH INSTRUMENTATION AND ALLOGRAFT   BIOPSY  06/26/2012   Procedure: BIOPSY;  Surgeon: Lodema Pilot, DO;  Location: [facility]S;  Service: General;;   CARDIAC  ELECTROPHYSIOLOGY STUDY AND ABLATION  08-02-2016   [facility]   (dr Alden Hipp)   pulmonary veins isolation and ablation afib   CARDIAC PACEMAKER PLACEMENT  05-01-2014   [facility]   CHOLECYSTECTOMY  1983   COLONOSCOPY WITH PROPOFOL N/A 07/13/2015   Procedure: COLONOSCOPY WITH PROPOFOL;  Surgeon: Charolett Bumpers, MD;  [facility];  Service: Endoscopy;  Laterality: N/A;   ESOPHAGECTOMY  08-21-2012   [facility]   w/ gastric pull-through   ESOPHAGOGASTRODUODENOSCOPY (EGD) WITH ESOPHAGEAL DILATION  multiple x32 per pt approx.--- last one 02-09-2015   recurrent anastomotic stricture post esophagectomy   HIATAL HERNIA REPAIR  06/26/2012   Procedure: LAPAROSCOPIC REPAIR OF HIATAL HERNIA;  Surgeon: Lodema Pilot, DO;  [facility];  Service: General;;   INCISIONAL HERNIA REPAIR  06/26/2012   Procedure: HERNIA REPAIR INCISIONAL;  Surgeon: Lodema Pilot, DO;  [facility];  Service: General;;   INGUINAL HERNIA REPAIR Bilateral 1982   JEJUNOSTOMY FEEDING TUBE  12/22/2012   Removed 12/ 2016  approx.   KNEE ARTHROSCOPY Left 1990   KNEE ARTHROSCOPY Right 12/2019   LAPAROSCOPIC GASTRIC SLEEVE RESECTION  06/2012   started but abandoned d/t discovery of tumor   LAPAROTOMY  07/12/2021   revision of previous gastric conduit, hiatal hernia repair   MASS EXCISION Left 04/04/2013   Procedure: EXCISION OF SCALP MASS;  Surgeon: Lodema Pilot, DO;  Location: [facility];  Service: General;  Laterality: Left;   pilomatrixoma   NESBIT PROCEDURE N/A 12/09/2016   Procedure: NESBIT PROCEDURE 16 DOT PLICATION;  Surgeon: Ihor Gully, MD;  Location: [facility] [place];  Service: Urology;  Laterality: N/A;   THORACOTOMY  07/12/2021   revision of previous gastric conduit   TONSILLECTOMY  1959   TRANSTHORACIC ECHOCARDIOGRAM  08-06-2015   [facility]   mild LVH, ef 50%/  mild RAE /  moderate LAE/  trivial AR, MR, PR,  and TR    Social History   Socioeconomic History   Marital status: Married    Spouse name: Not on file    Number of children: 4   Years of education: Not on file   Highest education level: Not on file  Occupational History   Occupation: retired, Medical illustrator, Designer, fashion/clothing  Tobacco Use   Smoking status: Never   Smokeless tobacco: Never  Substance and Sexual Activity   Alcohol use: Yes    Comment: socially   Drug use: Never   Sexual activity: Not on file  Other Topics Concern   Not on file  Social History Narrative   Not on file   Social Determinants of Health   Financial Resource Strain: Low Risk  (03/25/2023)   Overall Financial Resource Strain (CARDIA)    Difficulty of Paying Living Expenses: Not hard at all  Food Insecurity: No Food Insecurity (03/25/2023)   Hunger Vital Sign  Worried About Programme researcher, broadcasting/film/video in the Last Year: Never true    Ran Out of Food in the Last Year: Never true  Transportation Needs: No Transportation Needs (03/25/2023)   PRAPARE - Administrator, Civil Service (Medical): No    Lack of Transportation (Non-Medical): No  Physical Activity: Insufficiently Active (03/25/2023)   Exercise Vital Sign    Days of Exercise per Week: 2 days    Minutes of Exercise per Session: 10 min  Stress: Stress Concern Present (03/25/2023)   [employer] of Occupational Health - Occupational Stress Questionnaire    Feeling of Stress : To some extent  Social Connections: Unknown (03/25/2023)   Social Connection and Isolation Panel [NHANES]    Frequency of Communication with Friends and Family: Once a week    Frequency of Social Gatherings with Friends and Family: Not on file    Attends Religious Services: Not on file    Active Member of Clubs or Organizations: No    Attends Banker Meetings: Never    Marital Status: Married  Catering manager Violence: Not At Risk (03/27/2023)   Humiliation, Afraid, Rape, and Kick questionnaire    Fear of Current or Ex-Partner: No    Emotionally Abused: No    Physically Abused: No    Sexually Abused: No    Family  History  Problem Relation Age of Onset   Hypertension Mother    Heart failure Mother    Prostate cancer Father    Kidney cancer Father    Emphysema Father    Aortic aneurysm Father    Anorexia nervosa Sister    Depression Sister    Anorexia nervosa Sister    Heart disease Sister    Hypertension Brother    Cancer Maternal Grandmother    Heart attack Maternal Grandfather    Heart disease Maternal Grandfather    Cancer Paternal Grandmother    Alcohol abuse Paternal Grandfather    Early death Paternal Grandfather    Tuberculosis Paternal Grandfather    Colon cancer Neg Hx     ROS: no fevers or chills, productive cough, hemoptysis, dysphasia, odynophagia, melena, hematochezia, dysuria, hematuria, rash, seizure activity, orthopnea, PND, pedal edema, claudication. Remaining systems are negative.  Physical Exam: Well-developed well-nourished in no acute distress.  Skin is warm and dry.  HEENT is normal.  Neck is supple.  Chest is clear to auscultation with normal expansion.  Cardiovascular exam is regular rate and rhythm.  Abdominal exam nontender or distended. No masses palpated. Extremities show no edema. neuro grossly intact  A/P  1 paroxysmal atrial fibrillation-status post ablation but with some recurrences on device interrogation at [facility].  Continue metoprolol and apixaban.  2 coronary artery disease-moderate on most recent CTA.  FFR was negative.  Will plan medical therapy.  Continue statin.  No aspirin given need for anticoagulation.  3 pacemaker-managed at [facility].  4 history of orthostasis-patient continues to have dizziness with standing at times.  We discussed the importance of maintaining hydration and salting his food.  We will try and allow his blood pressure to run higher given his orthostatic symptoms.  Likely autonomic dysfunction secondary to diabetes mellitus.  5 prior TIA  6 hypertension-blood pressure has been controlled at home.  We  have allowed his blood pressure to run somewhat higher given history of orthostatic hypotension and syncope.  7 hyperlipidemia-continue Crestor at present dose.  8 elevated liver functions-follow-up with gastroenterology.  Olga Millers, MD

## 2023-03-21 DIAGNOSIS — R748 Abnormal levels of other serum enzymes: Secondary | ICD-10-CM | POA: Diagnosis not present

## 2023-03-22 LAB — NUCLEOTIDASE, 5', BLOOD: 5-Nucleotidase: 3 IU/L (ref 0–13)

## 2023-03-22 LAB — HEPATIC FUNCTION PANEL
ALT: 28 IU/L (ref 0–44)
AST: 31 IU/L (ref 0–40)
Albumin: 3.9 g/dL (ref 3.8–4.8)
Alkaline Phosphatase: 171 IU/L — ABNORMAL HIGH (ref 44–121)
Bilirubin Total: 0.6 mg/dL (ref 0.0–1.2)
Bilirubin, Direct: 0.22 mg/dL (ref 0.00–0.40)
Total Protein: 6.3 g/dL (ref 6.0–8.5)

## 2023-03-22 LAB — GAMMA GT: GGT: 141 IU/L — ABNORMAL HIGH (ref 0–65)

## 2023-03-23 ENCOUNTER — Telehealth: Payer: Self-pay | Admitting: Internal Medicine

## 2023-03-23 NOTE — Telephone Encounter (Signed)
Pt has appt w/ PCP tomorrow.

## 2023-03-23 NOTE — Telephone Encounter (Signed)
Pt dropped off BP reading for provider to see and have on chart. Document put at front office tray under providers name.

## 2023-03-24 ENCOUNTER — Ambulatory Visit: Payer: Medicare PPO | Admitting: Internal Medicine

## 2023-03-24 VITALS — BP 112/62 | HR 80 | Temp 98.4°F | Resp 18 | Ht 73.0 in | Wt 235.0 lb

## 2023-03-24 DIAGNOSIS — F1020 Alcohol dependence, uncomplicated: Secondary | ICD-10-CM

## 2023-03-24 DIAGNOSIS — R7989 Other specified abnormal findings of blood chemistry: Secondary | ICD-10-CM

## 2023-03-24 DIAGNOSIS — I10 Essential (primary) hypertension: Secondary | ICD-10-CM

## 2023-03-24 DIAGNOSIS — R748 Abnormal levels of other serum enzymes: Secondary | ICD-10-CM | POA: Diagnosis not present

## 2023-03-24 DIAGNOSIS — R718 Other abnormality of red blood cells: Secondary | ICD-10-CM

## 2023-03-24 NOTE — Patient Instructions (Addendum)
Vaccine advise:  -RSV  - covid booster  - flu shot  (fall) - pneumonia shot  PNM 20  Tylenol  500 mg OTC 2 tabs a day every 12 hours as needed for pain Minimize tramadol and ativan  GO TO THE LAB : Get the blood work     GO TO THE FRONT DESK, PLEASE SCHEDULE YOUR APPOINTMENTS Come back for  a check up in 3 to 4 months

## 2023-03-24 NOTE — Progress Notes (Unsigned)
Subjective:    Patient ID: Lawrence Forth., male    DOB: 1951/05/03, 72 y.o.   MRN: [id number]  DOS:  03/24/2023 Type of visit - description: f/u  F/u from previous visit - we discussed elevated AP, denies post prandial abd pain - I review his ambulatory VS: ++ orthostatic changes  -c/o dizzines when does yard work. No CP-DOE. Occ palpitations w/o diaphoresis or nausea - cont chronic GI sx- mild dysphagia to solids, freq loose stools w/o blood   Elevated GGT 03-22-23 Talk about reduction of Ativan and tramadol at [facility] 03/07/2023. Increased alkaline phosphatase, consider AMA check on the next opportunity JP 02/16/2023 Scan report: Colonoscopy 05/17/2021, + polyps.  Pathology: Tubular adenomas, hyperplastic polyp. Next 3 years.   Review of Systems See above   Past Medical History:  Diagnosis Date   Anxiety 01/20/2023   Bradycardia    Depression    GERD (gastroesophageal reflux disease)    H/O hiatal hernia    s/p  repair 06-26-2012   History of esophagectomy 08-21-2012  AT [facility]   History of kidney stones 2006   History of malignant neoplasm of esophagus ONCOLOGIST-  DR D'AMICO AT [facility]-- PER LAST NOTE 01/ 2018  NO RECURRENCE   dx 10/ 2013  Stage 2A (T2 N0)  08-21-2012 s/p  esophagectomy w/ gastric pull-through [facility]) post residual recurrent anastomotic stricture (multiple EGD w/ dilations)   History of transient ischemic attack (TIA) 2003   no residual   Hypertension    Long term (current) use of anticoagulants    xarelto   On dofetilide therapy    PAF (paroxysmal atrial fibrillation) (HCC) first dx 2009   primary cardiologist-  dr Verdis Prime /  EP cardiologist -- dr Alden Hipp ([facility])  s/p  afib ablation 08-02-2016   Pancreatic cyst    x2    Peyronie's disease    Presence of permanent cardiac pacemaker implanted 05-01-2014  at [facility]   Dr.Daubert [facility] follows   Pulmonary nodule, right    upper and middle lobes --  per last CT stable   Type 2 diabetes  mellitus treated with insulin (HCC)    Wears glasses     Past Surgical History:  Procedure Laterality Date   ANTERIOR CERVICAL DECOMP/DISCECTOMY FUSION N/A 06/02/2016   Procedure: ANTERIOR CERVICAL DECOMPRESSION FUSION CERVICAL 5-6, CERVICAL 6-7 WITH INSTRUMENTATION AND ALLOGRAFT;  Surgeon: Estill Bamberg, MD;  Location: [facility];  Service: Orthopedics;  Laterality: N/A;  ANTERIOR CERVICAL DECOMPRESSION FUSION CERVICAL 5-6, CERVICAL 6-7 WITH INSTRUMENTATION AND ALLOGRAFT   BIOPSY  06/26/2012   Procedure: BIOPSY;  Surgeon: Lodema Pilot, DO;  [facility]: [facility];  Service: General;;   CARDIAC ELECTROPHYSIOLOGY STUDY AND ABLATION  08-02-2016   [facility]   (dr Alden Hipp)   pulmonary veins isolation and ablation afib   CARDIAC PACEMAKER PLACEMENT  05-01-2014   [facility]   CHOLECYSTECTOMY  1983   COLONOSCOPY WITH PROPOFOL N/A 07/13/2015   Procedure: COLONOSCOPY WITH PROPOFOL;  Surgeon: Charolett Bumpers, MD;  [facility];  Service: Endoscopy;  Laterality: N/A;   ESOPHAGECTOMY  08-21-2012   [facility]   w/ gastric pull-through   ESOPHAGOGASTRODUODENOSCOPY (EGD) WITH ESOPHAGEAL DILATION  multiple x32 per pt approx.--- last one 02-09-2015   recurrent anastomotic stricture post esophagectomy   HIATAL HERNIA REPAIR  06/26/2012   Procedure: LAPAROSCOPIC REPAIR OF HIATAL HERNIA;  Surgeon: Lodema Pilot, DO;  [facility];  Service: General;;   INCISIONAL HERNIA REPAIR  06/26/2012   Procedure: HERNIA REPAIR  INCISIONAL;  Surgeon: Lodema Pilot, DO;  [facility];  Service: General;;   INGUINAL HERNIA REPAIR Bilateral 1982   JEJUNOSTOMY FEEDING TUBE  12/22/2012   Removed 12/ 2016  approx.   KNEE ARTHROSCOPY Left 1990   KNEE ARTHROSCOPY Right 12/2019   LAPAROSCOPIC GASTRIC SLEEVE RESECTION  06/2012   started but abandoned d/t discovery of tumor   LAPAROTOMY  07/12/2021   revision of previous gastric conduit, hiatal hernia repair   MASS EXCISION Left 04/04/2013   Procedure: EXCISION OF SCALP MASS;  Surgeon:  Lodema Pilot, DO;  [facility]: [facility];  Service: General;  Laterality: Left;   pilomatrixoma   NESBIT PROCEDURE N/A 12/09/2016   Procedure: NESBIT PROCEDURE 16 DOT PLICATION;  Surgeon: Ihor Gully, MD;  Location: [facility];  Service: Urology;  Laterality: N/A;   THORACOTOMY  07/12/2021   revision of previous gastric conduit   TONSILLECTOMY  1959   TRANSTHORACIC ECHOCARDIOGRAM  08-06-2015   [facility]   mild LVH, ef 50%/  mild RAE /  moderate LAE/  trivial AR, MR, PR,  and TR    Current Outpatient Medications  Medication Instructions   apixaban (ELIQUIS) 5 mg, Oral, 2 times daily   cyanocobalamin (VITAMIN B12) 1,000 mcg, Oral, Daily   ferrous sulfate 325 mg, Daily   FLUoxetine (PROZAC) 60 mg, Oral, Daily   HUMULIN 70/30 (70-30) 100 UNIT/ML injection 12 Units, Subcutaneous, BH-q am and hs, <BR><BR>   Insulin Syringe-Needle U-100 (BD VEO INSULIN SYRINGE U/F) 31G X 15/64" 0.3 ML MISC 4 times daily   LORazepam (ATIVAN) 1 mg, Oral, 2 times daily PRN   Metoprolol Tartrate (FIRST - METOPROLOL) 10 MG/ML SOLN 1.25 mLs, Oral, 2 times daily   Multiple Vitamins-Minerals (PRESERVISION AREDS PO) 1 tablet, Oral, Daily, Preservision AREDS   rosuvastatin (CRESTOR) 40 mg, Oral, Daily   traMADol (ULTRAM) 50 mg, Oral, At bedtime PRN       Objective:   Physical Exam BP 112/62 (BP Location: Left Arm, Patient Position: Sitting, Cuff Size: Normal)   Pulse 80   Temp 98.4 F (36.9 C) (Oral)   Resp 18   Ht 6\' 1"  (1.854 m)   Wt 235 lb (106.6 kg)   SpO2 95%   BMI 31.00 kg/m  General:   Well developed, NAD, BMI noted.  HEENT:  Normocephalic . Face symmetric, atraumatic Lungs:  CTA B Normal respiratory effort, no intercostal retractions, no accessory muscle use. Heart: RRR,  no murmur.  Abdomen:  Not distended, soft, non-tender. No rebound or rigidity.   Skin: Not pale. Not jaundice Lower extremities: no pretibial edema bilaterally  Neurologic:  alert & oriented X3.  Speech normal, gait  appropriate for age and unassisted Psych--  Cognition and judgment appear intact.  Cooperative with normal attention span and concentration.  Behavior appropriate. No anxious or depressed appearing.     Assessment     Assessment (new patient, previous PCP retired) DM- per endo Mild nonproliferative diabetic retinopathy with macular edema. HTN High cholesterol Anxiety  GI: Esophageal neoplasm 2013 found during a gastric bypass  Hiatal hernia: R thoracotomy Laparotomy lysis of adhesions for repair of paraesophageal hernia in 2022  Cardiovascular: A-fib dx 2014, anticoagulated Pacemaker 2015, Ablation @ [facility] 2017 TIA 2003 ?  MRI negative, MRA some atherosclerosis distal R vertebral artery? Orthostatic BP drop MSK: see surgeries, on tramadol   PLAN HTN : amb BPs wnl, + orthostatic changes. Cont same medicines High cholesterol: no changes  Increased alkaline phosphatase, consider AMA check on  the next opportunity Increased AP: +GGT, [place] 02/26/23: steathosis, no RUQ pain, get AMA, consider GI ETOH: drinking much less wine, 1/2 bottle to none a day MCV increased: B12 wnl, check thiamine, folic acid  Neck pain: minimize ultrem, ok tylenol Preventive care Colonoscopy 05/17/2021, + polyps.  Pathology: Tubular adenomas, hyperplastic polyp. Vaccine advise: RSV covid booster flu shot  (fall) PNM 20 Next 3 years. ==========   New patient, previous PCP Dr. Valentina Lucks retired Recent labs: 04-2022: A1c 7.3 05-2022: TSH 1.0 12/29/2022: Hemoglobin 14.4 01/05/2023: Creatinine 1.2, potassium 4.7. AST 51 slightly high, ALT 43 normal. Total cholesterol 118, LDL 44  DM: Currently on 70/30 insulin, follow-up at [facility], no recent A1c, request me to check it.  Will do HTN: No recent ambulatory BPs, BP today 130/90, current medications: Metoprolol.  No change, recommend to start checking at home High cholesterol: On Crestor, LDL satisfactory. Increased LFTs:Last AST 51, alkaline phosphatase 160.   Recheck labs today.  See next EtOH: Drinks 1 full bottle of wine daily, drinking heavily for the last 10 years ago after the diagnosis of esophageal cancer.  He knows is too much. EtOH could partially explain his increased LFTs.  We agreed to a drastic reduction to 1 glass at night. Gait disorder: This is his main concern, reports that back in 2022 had a prolonged admission at [facility], since then his balance is not the same, had frequent falls including one that happened at October 2023 which led to a small intraventricular hemorrhage. He did 2 rounds of PT, but the last one was stopped because DOE and diaphoresis, subsequently he saw cardiology. Plan: Go back to PT once cleared by cardiology, avoid   situations that could trigger falls, use a cane consistently. Neck pain: On tramadol, last prescription 01/06/2023, 60 tablets, takes on average 1 tablet a day.  Call for refill when needed.  Stated he is careful not mixing alcohol and tramadol. Increase MCV: See last CBC, could be alcohol effect versus vitamin deficiency, check a B12 and folic acid. Multiple records reviewed. RTC 6 weeks  Time spent 45 minutes.  Extensive chart review.  Complicated PMH. Also listening therapy provided about EtOH and his desire to reduce intake. Counseled about gait disorder.

## 2023-03-25 LAB — FOLATE: Folate: 15.1 ng/mL

## 2023-03-26 NOTE — Assessment & Plan Note (Signed)
HTN : amb BPs wnl, + orthostatic changes. Cont same medicines High cholesterol: no changes  Increased alkaline phosphatase,  With a +GGT, [place] 02/26/23: steathosis, no RUQ pain, get AMA, consider GI ETOH: drinking much less wine, 1/2 bottle of wine  to none a day MCV increased: B12 wnl, check thiamine, folic acid  Neck pain: recommend to minimize ultram, ok tylenol Preventive care Colonoscopy 05/17/2021, + polyps.  Pathology: Tubular adenomas, hyperplastic polyp. Vaccine advise: RSV covid booster flu shot  (fall) PNM 20 Next 3 m

## 2023-03-27 ENCOUNTER — Ambulatory Visit (INDEPENDENT_AMBULATORY_CARE_PROVIDER_SITE_OTHER): Payer: Medicare PPO | Admitting: *Deleted

## 2023-03-27 ENCOUNTER — Telehealth: Payer: Self-pay | Admitting: Internal Medicine

## 2023-03-27 DIAGNOSIS — R718 Other abnormality of red blood cells: Secondary | ICD-10-CM | POA: Diagnosis not present

## 2023-03-27 DIAGNOSIS — R7989 Other specified abnormal findings of blood chemistry: Secondary | ICD-10-CM | POA: Diagnosis not present

## 2023-03-27 DIAGNOSIS — Z0000 Encounter for general adult medical examination without abnormal findings: Secondary | ICD-10-CM | POA: Diagnosis not present

## 2023-03-27 NOTE — Progress Notes (Signed)
Subjective:   Lawrence [name]. is a 72 y.o. male who presents for Medicare Annual/Subsequent preventive examination.  Visit Complete: Virtual  I connected with  Lawrence [name]. on 03/27/23 by a audio enabled telemedicine application and verified that I am speaking with the correct person using two identifiers.  Patient Location: Home  Provider Location: Office/Clinic  I discussed the limitations of evaluation and management by telemedicine. The patient expressed understanding and agreed to proceed.  Patient Medicare AWV questionnaire was completed by the patient on 03/25/23; I have confirmed that all information answered by patient is correct and no changes since this date.  Review of Systems     Cardiac Risk Factors include: advanced age (>54men, >41 women);male gender;diabetes mellitus;dyslipidemia;hypertension;obesity (BMI >30kg/m2)     Objective:    Per patient no change in vitals since last visit, unable to obtain new vitals due to telehealth visit      03/27/2023    8:16 AM 06/23/2022    8:19 AM 12/09/2016    6:08 AM 06/02/2016    6:46 AM 05/26/2016    8:43 AM 03/26/2016    7:29 AM 07/13/2015    9:47 AM  Advanced Directives  Does Patient Have a Medical Advance Directive? Yes Yes Yes Yes Yes No;Yes Yes  Type of Estate agent of [place];Living will  Healthcare Power of [place];Living will Healthcare Power of [place];Living will Healthcare Power of [place];Living will Healthcare Power of [place];Living will;Out of facility DNR (pink MOST or yellow form) Healthcare Power of [place];Living will  Does patient want to make changes to medical advance directive? Yes (MAU/Ambulatory/Procedural Areas - Information given)    No - Patient declined No - Patient declined   Copy of Healthcare Power of Attorney in Chart? Yes - validated most recent copy scanned in chart (See row information)  -- No - copy requested No - copy requested No - copy requested Yes   Would patient like information on creating a medical advance directive?      Yes - Educational materials given     Current Medications (verified) Outpatient Encounter Medications as of 03/27/2023  Medication Sig   apixaban (ELIQUIS) 5 MG TABS tablet Take 1 tablet (5 mg total) by mouth 2 (two) times daily.   ferrous sulfate 325 (65 FE) MG tablet 325 mg daily.   FLUoxetine (PROZAC) 20 MG tablet Take 60 mg by mouth daily.   HUMULIN 70/30 (70-30) 100 UNIT/ML injection Inject 12 Units into the skin 2 (two) times daily in the am and at bedtime..   Insulin Syringe-Needle U-100 (BD VEO INSULIN SYRINGE U/F) 31G X 15/64" 0.3 ML MISC in the morning, at noon, in the evening, and at bedtime.   LORazepam (ATIVAN) 1 MG tablet Take 1 tablet (1 mg total) by mouth 2 (two) times daily as needed for anxiety or sleep.   Metoprolol Tartrate (FIRST - METOPROLOL) 10 MG/ML SOLN Take 1.25 mLs by mouth in the morning and at bedtime.   Multiple Vitamins-Minerals (PRESERVISION AREDS PO) Take 1 tablet by mouth daily. Preservision AREDS   rosuvastatin (CRESTOR) 40 MG tablet Take 40 mg by mouth daily.   traMADol (ULTRAM) 50 MG tablet Take 1 tablet (50 mg total) by mouth at bedtime as needed for moderate pain.   vitamin B-12 (CYANOCOBALAMIN) 1000 MCG tablet Take 1,000 mcg by mouth daily.   No facility-administered encounter medications on file as of 03/27/2023.    Allergies (verified) Chlorthalidone, Lisinopril, and Metformin hcl   History: Past  Medical History:  Diagnosis Date   Anxiety 01/20/2023   Bradycardia    Depression    GERD (gastroesophageal reflux disease)    H/O hiatal hernia    s/p  repair 06-26-2012   History of esophagectomy 08-21-2012  AT [facility]   History of kidney stones 2006   History of malignant neoplasm of esophagus ONCOLOGIST-  DR D'AMICO AT [facility]-- PER LAST NOTE 01/ 2018  NO RECURRENCE   dx 10/ 2013  Stage 2A (T2 N0)  08-21-2012 s/p  esophagectomy w/ gastric pull-through [facility]) post  residual recurrent anastomotic stricture (multiple EGD w/ dilations)   History of transient ischemic attack (TIA) 2003   no residual   Hypertension    Long term (current) use of anticoagulants    xarelto   On dofetilide therapy    PAF (paroxysmal atrial fibrillation) (HCC) first dx 2009   primary cardiologist-  dr Verdis Prime /  EP cardiologist -- dr Alden Hipp ([facility])  s/p  afib ablation 08-02-2016   Pancreatic cyst    x2    Peyronie's disease    Presence of permanent cardiac pacemaker implanted 05-01-2014  at [facility]   Dr.Daubert -[facility] follows   Pulmonary nodule, right    upper and middle lobes --  per last CT stable   Type 2 diabetes mellitus treated with insulin (HCC)    Wears glasses    Past Surgical History:  Procedure Laterality Date   ANTERIOR CERVICAL DECOMP/DISCECTOMY FUSION N/A 06/02/2016   Procedure: ANTERIOR CERVICAL DECOMPRESSION FUSION CERVICAL 5-6, CERVICAL 6-7 WITH INSTRUMENTATION AND ALLOGRAFT;  Surgeon: Estill Bamberg, MD;  Location: [facility] OR;  Service: Orthopedics;  Laterality: N/A;  ANTERIOR CERVICAL DECOMPRESSION FUSION CERVICAL 5-6, CERVICAL 6-7 WITH INSTRUMENTATION AND ALLOGRAFT   BIOPSY  06/26/2012   Procedure: BIOPSY;  Surgeon: Lodema Pilot, DO;  Location: [facility] ORS;  Service: General;;   CARDIAC ELECTROPHYSIOLOGY STUDY AND ABLATION  08-02-2016   [facility]   (dr Alden Hipp)   pulmonary veins isolation and ablation afib   CARDIAC PACEMAKER PLACEMENT  05-01-2014   [facility]   CHOLECYSTECTOMY  1983   COLONOSCOPY WITH PROPOFOL N/A 07/13/2015   Procedure: COLONOSCOPY WITH PROPOFOL;  Surgeon: Charolett Bumpers, MD;  Location: [facility] END[facility];  Service: Endoscopy;  Laterality: N/A;   ESOPHAGECTOMY  08-21-2012   [facility]   w/ gastric pull-through   ESOPHAGOGASTRODUODENOSCOPY (EGD) WITH ESOPHAGEAL DILATION  multiple x32 per pt approx.--- last one 02-09-2015   recurrent anastomotic stricture post esophagectomy   HIATAL HERNIA REPAIR  06/26/2012   Procedure: LAPAROSCOPIC REPAIR OF  HIATAL HERNIA;  Surgeon: Lodema Pilot, DO;  Location: [facility];  Service: General;;   INCISIONAL HERNIA REPAIR  06/26/2012   Procedure: HERNIA REPAIR INCISIONAL;  Surgeon: Lodema Pilot, DO;  Location: [facility];  Service: General;;   INGUINAL HERNIA REPAIR Bilateral 1982   JEJUNOSTOMY FEEDING TUBE  12/22/2012   Removed 12/ 2016  approx.   KNEE ARTHROSCOPY Left 1990   KNEE ARTHROSCOPY Right 12/2019   LAPAROSCOPIC GASTRIC SLEEVE RESECTION  06/2012   started but abandoned d/t discovery of tumor   LAPAROTOMY  07/12/2021   revision of previous gastric conduit, hiatal hernia repair   MASS EXCISION Left 04/04/2013   Procedure: EXCISION OF SCALP MASS;  Surgeon: Lodema Pilot, DO;  Location: [facility];  Service: General;  Laterality: Left;   pilomatrixoma   NESBIT PROCEDURE N/A 12/09/2016   Procedure: NESBIT PROCEDURE 16 DOT PLICATION;  Surgeon: Ihor Gully, MD;  Location: [facility] ;  Service: Urology;  Laterality: N/A;   THORACOTOMY  07/12/2021   revision of previous gastric conduit   TONSILLECTOMY  1959   TRANSTHORACIC ECHOCARDIOGRAM  08-06-2015   [facility]   mild LVH, ef 50%/  mild RAE /  moderate LAE/  trivial AR, MR, PR,  and TR   Family History  Problem Relation Age of Onset   Hypertension Mother    Heart failure Mother    Prostate cancer Father    Kidney cancer Father    Emphysema Father    Aortic aneurysm Father    Anorexia nervosa Sister    Depression Sister    Anorexia nervosa Sister    Heart disease Sister    Hypertension Brother    Cancer Maternal Grandmother    Heart attack Maternal Grandfather    Heart disease Maternal Grandfather    Cancer Paternal Grandmother    Alcohol abuse Paternal Grandfather    Early death Paternal Grandfather    Tuberculosis Paternal Grandfather    Colon cancer Neg Hx    Social History   Socioeconomic History   Marital status: Married    Spouse name: Not on file   Number of children: 4   Years of education: Not on file   Highest  education level: Not on file  Occupational History   Occupation: retired, Medical illustrator, Designer, fashion/clothing  Tobacco Use   Smoking status: Never   Smokeless tobacco: Never  Substance and Sexual Activity   Alcohol use: Yes    Comment: socially   Drug use: Never   Sexual activity: Not on file  Other Topics Concern   Not on file  Social History Narrative   Not on file   Social Determinants of Health   Financial Resource Strain: Low Risk  (03/25/2023)   Overall Financial Resource Strain (CARDIA)    Difficulty of Paying Living Expenses: Not hard at all  Food Insecurity: No Food Insecurity (03/25/2023)   Hunger Vital Sign    Worried About Running Out of Food in the Last Year: Never true    Ran Out of Food in the Last Year: Never true  Transportation Needs: No Transportation Needs (03/25/2023)   PRAPARE - Administrator, Civil Service (Medical): No    Lack of Transportation (Non-Medical): No  Physical Activity: Insufficiently Active (03/25/2023)   Exercise Vital Sign    Days of Exercise per Week: 2 days    Minutes of Exercise per Session: 10 min  Stress: Stress Concern Present (03/25/2023)   [employer] of Occupational Health - Occupational Stress Questionnaire    Feeling of Stress : To some extent  Social Connections: Unknown (03/25/2023)   Social Connection and Isolation Panel [NHANES]    Frequency of Communication with Friends and Family: Once a week    Frequency of Social Gatherings with Friends and Family: Not on file    Attends Religious Services: Not on Marketing executive or Organizations: No    Attends Banker Meetings: Never    Marital Status: Married    Tobacco Counseling Counseling given: Not Answered   Clinical Intake:  Pre-visit preparation completed: Yes  Pain : No/denies pain  BMI - recorded: 31 Nutritional Status: BMI > 30  Obese Nutritional Risks: None Diabetes: Yes CBG done?: No Did pt. bring in CBG monitor from home?:  No  How often do you need to have someone help you when you read instructions, pamphlets, or other written materials from your doctor or pharmacy?: 2 - Rarely  Interpreter Needed?: No  Information entered by :: Donne Anon, CMA   Activities of Daily Living    03/25/2023   10:12 AM  In your present state of health, do you have any difficulty performing the following activities:  Hearing? 0  Vision? 0  Difficulty concentrating or making decisions? 1  Walking or climbing stairs? 1  Dressing or bathing? 0  Doing errands, shopping? 0  Preparing Food and eating ? N  Using the Toilet? N  In the past six months, have you accidently leaked urine? N  Do you have problems with loss of bowel control? Y  Managing your Medications? N  Managing your Finances? N  Housekeeping or managing your Housekeeping? N    Patient Care Team: Wanda Plump, MD as PCP - General (Internal Medicine) Jens Som Madolyn Frieze, MD as PCP - Cardiology (Cardiology) Leata Mouse, MD as Consulting Physician (Psychiatry) Maeola Sarah, MD as Referring Physician (Ophthalmology) Denyse Dago, MD as Referring Physician Estill Bamberg, MD as Consulting Physician (Orthopedic Surgery) Leslye Peer, MD as Consulting Physician (Pulmonary Disease) Daubert, Fayrene Fearing, MD (Internal Medicine) Mat Carne, DO (Optometry) Philippa Chester, MD as Referring Physician (Endocrinology) Harvest [place], MD as Referring Physician (Thoracic Surgery) Tito Dine., MD as Consulting Physician (General Surgery) Kathi Der, MD as Consulting Physician (Gastroenterology)  Indicate any recent Medical Services you may have received from other than [facility] providers in the past year (date may be approximate).     Assessment:   This is a routine wellness examination for Lawrence [name].  Hearing/Vision screen No results found.  Dietary issues and exercise activities discussed:     Goals Addressed   None    Depression  Screen    03/27/2023    8:28 AM  PHQ 2/9 Scores  PHQ - 2 Score 0    Fall Risk    03/25/2023   10:12 AM  Fall Risk   Falls in the past year? 1  Number falls in past yr: 1  Injury with Fall? 1  Risk for fall due to : History of fall(s)  Follow up Falls evaluation completed    MEDICARE RISK AT HOME:   TIMED UP AND GO:  Was the test performed?  No    Cognitive Function:        03/27/2023    8:33 AM  6CIT Screen  What Year? 0 points  What month? 0 points  What time? 0 points  Count back from 20 0 points  Months in reverse 0 points  Repeat phrase 0 points  Total Score 0 points    Immunizations Immunization History  Administered Date(s) Administered   Influenza-Unspecified 07/02/2014, 06/08/2015, 05/07/2016, 06/08/2017, 06/06/2018   PFIZER Comirnaty(Gray Top)Covid-19 Tri-Sucrose Vaccine 12/12/2020   PFIZER(Purple Top)SARS-COV-2 Vaccination 10/20/2019, 11/11/2019, 06/01/2020   Pfizer Covid-19 Vaccine Bivalent Booster 53yrs & up 06/09/2021, 04/27/2022   Tdap 03/31/2017   Zoster Recombinant(Shingrix) 04/12/2018    TDAP status: Up to date  Flu Vaccine status: Up to date  Pneumococcal vaccine status: Due, Education has been provided regarding the importance of this vaccine. Advised may receive this vaccine at local pharmacy or Health Dept. Aware to provide a copy of the vaccination record if obtained from local pharmacy or Health Dept. Verbalized acceptance and understanding.  Covid-19 vaccine status: Information provided on how to obtain vaccines.   Qualifies for Shingles Vaccine? Yes   Zostavax completed No   Shingrix Completed?: No.    Education has been provided regarding the importance of this  vaccine. Patient has been advised to call insurance company to determine out of pocket expense if they have not yet received this vaccine. Advised may also receive vaccine at local pharmacy or Health Dept. Verbalized acceptance and understanding.  Screening Tests Health  Maintenance  Topic Date Due   Hepatitis C Screening  Never done   Pneumonia Vaccine 42+ Years old (1 of 1 - PCV) Never done   Zoster Vaccines- Shingrix (2 of 2) 06/07/2018   Diabetic kidney evaluation - Urine ACR  04/07/2023   Medicare Annual Wellness (AWV)  04/07/2023   COVID-19 Vaccine (7 - 2023-24 season) 07/14/2023 (Originally 06/22/2022)   INFLUENZA VACCINE  04/06/2023   FOOT EXAM  04/16/2023   HEMOGLOBIN A1C  08/10/2023   OPHTHALMOLOGY EXAM  11/15/2023   Diabetic kidney evaluation - eGFR measurement  01/05/2024   Colonoscopy  05/17/2024   DTaP/Tdap/Td (2 - Td or Tdap) 04/01/2027   HPV VACCINES  Aged Out    Health Maintenance  Health Maintenance Due  Topic Date Due   Hepatitis C Screening  Never done   Pneumonia Vaccine 58+ Years old (1 of 1 - PCV) Never done   Zoster Vaccines- Shingrix (2 of 2) 06/07/2018   Diabetic kidney evaluation - Urine ACR  04/07/2023   Medicare Annual Wellness (AWV)  04/07/2023    Colorectal cancer screening: Type of screening: Colonoscopy. Completed 05/17/21. Repeat every 3 years  Lung Cancer Screening: (Low Dose CT Chest recommended if Age 52-80 years, 20 pack-year currently smoking OR have quit w/in 15years.) does not qualify.    Additional Screening:  Hepatitis C Screening: does qualify; Completed N/a  Vision Screening: Recommended annual ophthalmology exams for early detection of glaucoma and other disorders of the eye. Is the patient up to date with their annual eye exam?  Yes  Who is the provider or what is the name of the office in which the patient attends annual eye exams? [facility] If pt is not established with a provider, would they like to be referred to a provider to establish care? No .   Dental Screening: Recommended annual dental exams for proper oral hygiene  Diabetic Foot Exam: Diabetic Foot Exam: Completed 04/15/22  Community Resource Referral / Chronic Care Management: CRR required this visit?  No    CCM required this visit?  No     Plan:     I have personally reviewed and noted the following in the patient's chart:   Medical and social history Use of alcohol, tobacco or illicit drugs  Current medications and supplements including opioid prescriptions. Patient is not currently taking opioid prescriptions. Functional ability and status Nutritional status Physical activity Advanced directives List of other physicians Hospitalizations, surgeries, and ER visits in previous 12 months Vitals Screenings to include cognitive, depression, and falls Referrals and appointments  In addition, I have reviewed and discussed with patient certain preventive protocols, quality metrics, and best practice recommendations. A written personalized care plan for preventive services as well as general preventive health recommendations were provided to patient.     Donne Anon, CMA   03/27/2023   After Visit Summary: (MyChart) Due to this being a telephonic visit, the after visit summary with patients personalized plan was offered to patient via MyChart   Nurse Notes: None

## 2023-03-27 NOTE — Telephone Encounter (Signed)
Jarold Song called and is needing the labs from the liver test. Please fax to [phone]

## 2023-03-27 NOTE — Telephone Encounter (Signed)
Labs faxed

## 2023-03-27 NOTE — Patient Instructions (Signed)
Lawrence [name] , Thank you for taking time to come for your Medicare Wellness Visit. I appreciate your ongoing commitment to your health goals. Please review the following plan we discussed and let me know if I can assist you in the future.   These are the goals we discussed:  Goals   None     This is a list of the screening recommended for you and due dates:  Health Maintenance  Topic Date Due   Hepatitis C Screening  Never done   Pneumonia Vaccine (1 of 1 - PCV) Never done   Zoster (Shingles) Vaccine (2 of 2) 06/07/2018   Yearly kidney health urinalysis for diabetes  04/07/2023   COVID-19 Vaccine (7 - 2023-24 season) 07/14/2023*   Flu Shot  04/06/2023   Complete foot exam   04/16/2023   Hemoglobin A1C  08/10/2023   Eye exam for diabetics  11/15/2023   Yearly kidney function blood test for diabetes  01/05/2024   Medicare Annual Wellness Visit  03/26/2024   Colon Cancer Screening  05/17/2024   DTaP/Tdap/Td vaccine (2 - Td or Tdap) 04/01/2027   HPV Vaccine  Aged Out  *Topic was postponed. The date shown is not the original due date.     Next appointment: Follow up in one year for your annual wellness visit.   Preventive Care 42 Years and Older, Male Preventive care refers to lifestyle choices and visits with your health care provider that can promote health and wellness. What does preventive care include? A yearly physical exam. This is also called an annual well check. Dental exams once or twice a year. Routine eye exams. Ask your health care provider how often you should have your eyes checked. Personal lifestyle choices, including: Daily care of your teeth and gums. Regular physical activity. Eating a healthy diet. Avoiding tobacco and drug use. Limiting alcohol use. Practicing safe sex. Taking low doses of aspirin every day. Taking vitamin and mineral supplements as recommended by your health care provider. What happens during an annual well check? The services and  screenings done by your health care provider during your annual well check will depend on your age, overall health, lifestyle risk factors, and family history of disease. Counseling  Your health care provider may ask you questions about your: Alcohol use. Tobacco use. Drug use. Emotional well-being. Home and relationship well-being. Sexual activity. Eating habits. History of falls. Memory and ability to understand (cognition). Work and work Astronomer. Screening  You may have the following tests or measurements: Height, weight, and BMI. Blood pressure. Lipid and cholesterol levels. These may be checked every 5 years, or more frequently if you are over 50 years old. Skin check. Lung cancer screening. You may have this screening every year starting at age 70 if you have a 30-pack-year history of smoking and currently smoke or have quit within the past 15 years. Fecal occult blood test (FOBT) of the stool. You may have this test every year starting at age 23. Flexible sigmoidoscopy or colonoscopy. You may have a sigmoidoscopy every 5 years or a colonoscopy every 10 years starting at age 46. Prostate cancer screening. Recommendations will vary depending on your family history and other risks. Hepatitis C blood test. Hepatitis B blood test. Sexually transmitted disease (STD) testing. Diabetes screening. This is done by checking your blood sugar (glucose) after you have not eaten for a while (fasting). You may have this done every 1-3 years. Abdominal aortic aneurysm (AAA) screening. You may need this if  you are a current or former smoker. Osteoporosis. You may be screened starting at age 69 if you are at high risk. Talk with your health care provider about your test results, treatment options, and if necessary, the need for more tests. Vaccines  Your health care provider may recommend certain vaccines, such as: Influenza vaccine. This is recommended every year. Tetanus, diphtheria, and  acellular pertussis (Tdap, Td) vaccine. You may need a Td booster every 10 years. Zoster vaccine. You may need this after age 9. Pneumococcal 13-valent conjugate (PCV13) vaccine. One dose is recommended after age 63. Pneumococcal polysaccharide (PPSV23) vaccine. One dose is recommended after age 31. Talk to your health care provider about which screenings and vaccines you need and how often you need them. This information is not intended to replace advice given to you by your health care provider. Make sure you discuss any questions you have with your health care provider. Document Released: 09/18/2015 Document Revised: 05/11/2016 Document Reviewed: 06/23/2015 Elsevier Interactive Patient Education  2017 [employer].  Fall Prevention in the Home Falls can cause injuries. They can happen to people of all ages. There are many things you can do to make your home safe and to help prevent falls. What can I do on the outside of my home? Regularly fix the edges of walkways and driveways and fix any cracks. Remove anything that might make you trip as you walk through a door, such as a raised step or threshold. Trim any bushes or trees on the path to your home. Use bright outdoor lighting. Clear any walking paths of anything that might make someone trip, such as rocks or tools. Regularly check to see if handrails are loose or broken. Make sure that both sides of any steps have handrails. Any raised decks and porches should have guardrails on the edges. Have any leaves, snow, or ice cleared regularly. Use sand or salt on walking paths during winter. Clean up any spills in your garage right away. This includes oil or grease spills. What can I do in the bathroom? Use night lights. Install grab bars by the toilet and in the tub and shower. Do not use towel bars as grab bars. Use non-skid mats or decals in the tub or shower. If you need to sit down in the shower, use a plastic, non-slip stool. Keep  the floor dry. Clean up any water that spills on the floor as soon as it happens. Remove soap buildup in the tub or shower regularly. Attach bath mats securely with double-sided non-slip rug tape. Do not have throw rugs and other things on the floor that can make you trip. What can I do in the bedroom? Use night lights. Make sure that you have a light by your bed that is easy to reach. Do not use any sheets or blankets that are too big for your bed. They should not hang down onto the floor. Have a firm chair that has side arms. You can use this for support while you get dressed. Do not have throw rugs and other things on the floor that can make you trip. What can I do in the kitchen? Clean up any spills right away. Avoid walking on wet floors. Keep items that you use a lot in easy-to-reach places. If you need to reach something above you, use a strong step stool that has a grab bar. Keep electrical cords out of the way. Do not use floor polish or wax that makes floors slippery. If  you must use wax, use non-skid floor wax. Do not have throw rugs and other things on the floor that can make you trip. What can I do with my stairs? Do not leave any items on the stairs. Make sure that there are handrails on both sides of the stairs and use them. Fix handrails that are broken or loose. Make sure that handrails are as long as the stairways. Check any carpeting to make sure that it is firmly attached to the stairs. Fix any carpet that is loose or worn. Avoid having throw rugs at the top or bottom of the stairs. If you do have throw rugs, attach them to the floor with carpet tape. Make sure that you have a light switch at the top of the stairs and the bottom of the stairs. If you do not have them, ask someone to add them for you. What else can I do to help prevent falls? Wear shoes that: Do not have high heels. Have rubber bottoms. Are comfortable and fit you well. Are closed at the toe. Do not  wear sandals. If you use a stepladder: Make sure that it is fully opened. Do not climb a closed stepladder. Make sure that both sides of the stepladder are locked into place. Ask someone to hold it for you, if possible. Clearly mark and make sure that you can see: Any grab bars or handrails. First and last steps. Where the edge of each step is. Use tools that help you move around (mobility aids) if they are needed. These include: Canes. Walkers. Scooters. Crutches. Turn on the lights when you go into a dark area. Replace any light bulbs as soon as they burn out. Set up your furniture so you have a clear path. Avoid moving your furniture around. If any of your floors are uneven, fix them. If there are any pets around you, be aware of where they are. Review your medicines with your doctor. Some medicines can make you feel dizzy. This can increase your chance of falling. Ask your doctor what other things that you can do to help prevent falls. This information is not intended to replace advice given to you by your health care provider. Make sure you discuss any questions you have with your health care provider. Document Released: 06/18/2009 Document Revised: 01/28/2016 Document Reviewed: 09/26/2014 Elsevier Interactive Patient Education  2017 [employer].

## 2023-03-29 ENCOUNTER — Ambulatory Visit: Payer: Medicare PPO | Admitting: Cardiology

## 2023-03-29 ENCOUNTER — Ambulatory Visit: Payer: Medicare PPO | Attending: Cardiology | Admitting: Cardiology

## 2023-03-29 ENCOUNTER — Encounter: Payer: Self-pay | Admitting: Cardiology

## 2023-03-29 VITALS — BP 102/62 | HR 85 | Ht 73.0 in | Wt 233.1 lb

## 2023-03-29 DIAGNOSIS — I951 Orthostatic hypotension: Secondary | ICD-10-CM

## 2023-03-29 DIAGNOSIS — E785 Hyperlipidemia, unspecified: Secondary | ICD-10-CM | POA: Diagnosis not present

## 2023-03-29 DIAGNOSIS — I10 Essential (primary) hypertension: Secondary | ICD-10-CM | POA: Diagnosis not present

## 2023-03-29 DIAGNOSIS — I480 Paroxysmal atrial fibrillation: Secondary | ICD-10-CM

## 2023-03-29 DIAGNOSIS — R748 Abnormal levels of other serum enzymes: Secondary | ICD-10-CM

## 2023-03-29 DIAGNOSIS — I2510 Atherosclerotic heart disease of native coronary artery without angina pectoris: Secondary | ICD-10-CM

## 2023-03-29 NOTE — Patient Instructions (Signed)

## 2023-03-30 DIAGNOSIS — Z794 Long term (current) use of insulin: Secondary | ICD-10-CM | POA: Diagnosis not present

## 2023-03-30 DIAGNOSIS — E1165 Type 2 diabetes mellitus with hyperglycemia: Secondary | ICD-10-CM | POA: Diagnosis not present

## 2023-03-30 LAB — MITOCHONDRIAL ANTIBODIES: Mitochondrial M2 Ab, IgG: <=20 U (ref <=20.0)

## 2023-03-30 LAB — VITAMIN B1: Vitamin B1 (Thiamine): 9 nmol/L (ref 8–30)

## 2023-03-31 DIAGNOSIS — E1165 Type 2 diabetes mellitus with hyperglycemia: Secondary | ICD-10-CM | POA: Diagnosis not present

## 2023-04-03 DIAGNOSIS — R7989 Other specified abnormal findings of blood chemistry: Secondary | ICD-10-CM | POA: Diagnosis not present

## 2023-04-03 DIAGNOSIS — R718 Other abnormality of red blood cells: Secondary | ICD-10-CM | POA: Diagnosis not present

## 2023-04-03 DIAGNOSIS — Z9049 Acquired absence of other specified parts of digestive tract: Secondary | ICD-10-CM | POA: Diagnosis not present

## 2023-04-03 DIAGNOSIS — R195 Other fecal abnormalities: Secondary | ICD-10-CM | POA: Diagnosis not present

## 2023-04-03 DIAGNOSIS — Z8501 Personal history of malignant neoplasm of esophagus: Secondary | ICD-10-CM | POA: Diagnosis not present

## 2023-04-03 DIAGNOSIS — R945 Abnormal results of liver function studies: Secondary | ICD-10-CM | POA: Diagnosis not present

## 2023-04-03 DIAGNOSIS — Z8679 Personal history of other diseases of the circulatory system: Secondary | ICD-10-CM | POA: Diagnosis not present

## 2023-04-03 DIAGNOSIS — R748 Abnormal levels of other serum enzymes: Secondary | ICD-10-CM | POA: Diagnosis not present

## 2023-04-03 LAB — HM HEPATITIS C SCREENING LAB: HM Hepatitis Screen: NEGATIVE

## 2023-04-03 LAB — HEPATITIS B SURFACE ANTIGEN: Hepatitis B Surface Ag: NEGATIVE

## 2023-04-03 NOTE — Addendum Note (Signed)
Addended byConrad Chandler D on: 04/03/2023 01:02 PM   Modules accepted: Orders

## 2023-04-04 ENCOUNTER — Encounter: Payer: Self-pay | Admitting: Internal Medicine

## 2023-04-04 NOTE — Addendum Note (Signed)
Addended by: Conrad  D on: 04/04/2023 11:35 AM   Modules accepted: Orders

## 2023-04-05 ENCOUNTER — Telehealth: Payer: Self-pay | Admitting: Internal Medicine

## 2023-04-05 NOTE — Telephone Encounter (Signed)
Pt came in office and dropped off copies of results from other facility done, pt wanted PCP to see results and have on pt's chart. Document put at front office tray under providers name.

## 2023-04-05 NOTE — Telephone Encounter (Signed)
FYI. Placed in your yellow folder.

## 2023-04-07 ENCOUNTER — Encounter: Payer: Self-pay | Admitting: Internal Medicine

## 2023-04-07 ENCOUNTER — Ambulatory Visit (INDEPENDENT_AMBULATORY_CARE_PROVIDER_SITE_OTHER): Payer: Medicare PPO | Admitting: Internal Medicine

## 2023-04-07 VITALS — BP 137/78 | HR 77 | Temp 98.2°F | Resp 18 | Ht 73.0 in | Wt 235.2 lb

## 2023-04-07 DIAGNOSIS — Z23 Encounter for immunization: Secondary | ICD-10-CM

## 2023-04-07 DIAGNOSIS — E1142 Type 2 diabetes mellitus with diabetic polyneuropathy: Secondary | ICD-10-CM

## 2023-04-07 DIAGNOSIS — R269 Unspecified abnormalities of gait and mobility: Secondary | ICD-10-CM | POA: Diagnosis not present

## 2023-04-07 DIAGNOSIS — E119 Type 2 diabetes mellitus without complications: Secondary | ICD-10-CM | POA: Diagnosis not present

## 2023-04-07 DIAGNOSIS — Z0001 Encounter for general adult medical examination with abnormal findings: Secondary | ICD-10-CM

## 2023-04-07 DIAGNOSIS — Z125 Encounter for screening for malignant neoplasm of prostate: Secondary | ICD-10-CM | POA: Diagnosis not present

## 2023-04-07 DIAGNOSIS — R748 Abnormal levels of other serum enzymes: Secondary | ICD-10-CM | POA: Diagnosis not present

## 2023-04-07 DIAGNOSIS — Z0000 Encounter for general adult medical examination without abnormal findings: Secondary | ICD-10-CM

## 2023-04-07 DIAGNOSIS — I10 Essential (primary) hypertension: Secondary | ICD-10-CM | POA: Diagnosis not present

## 2023-04-07 DIAGNOSIS — R296 Repeated falls: Secondary | ICD-10-CM | POA: Diagnosis not present

## 2023-04-07 LAB — MICROALBUMIN / CREATININE URINE RATIO
Creatinine,U: 228.6 mg/dL
Microalb Creat Ratio: 12.2 mg/g (ref 0.0–30.0)
Microalb, Ur: 28 mg/dL — ABNORMAL HIGH (ref 0.0–1.9)

## 2023-04-07 LAB — PSA: PSA: 2.88 ng/mL (ref 0.10–4.00)

## 2023-04-07 NOTE — Patient Instructions (Addendum)
Vaccines I recommend: RSV COVID-vaccine Flu shot this fall  We are referring you to physical therapy at [facility] orthopedics  We are referring you to [facility] endocrinology for evaluation of elevated alkaline phosphatase.  Hopefully they will be able to manage your diabetes if you request that as well.        GO TO THE LAB : Get the blood work     GO TO THE FRONT DESK, PLEASE SCHEDULE YOUR APPOINTMENTS Come back for   checkup in 4 months

## 2023-04-07 NOTE — Progress Notes (Addendum)
Subjective:    Patient ID: Lawrence [name]., male    DOB: [birth year], 72 y.o.   MRN: [id number]  DOS:  04/07/2023 Type of visit - description: CPX  Here for CPX Chronic medical problems were addressed. No new or significant symptoms at this point   Review of Systems  A 14 point review of systems is negative    Past Medical History:  Diagnosis Date   Anxiety 01/20/2023   Bradycardia    Depression    GERD (gastroesophageal reflux disease)    H/O hiatal hernia    s/p  repair 06-26-2012   History of esophagectomy 08-21-2012  AT [facility]   History of kidney stones 2006   History of malignant neoplasm of esophagus ONCOLOGIST-  DR D'AMICO AT [facility]-- PER LAST NOTE 01/ 2018  NO RECURRENCE   dx 10/ 2013  Stage 2A (T2 N0)  08-21-2012 s/p  esophagectomy w/ gastric pull-through [facility]) post residual recurrent anastomotic stricture (multiple EGD w/ dilations)   History of transient ischemic attack (TIA) 2003   no residual   Hypertension    Long term (current) use of anticoagulants    xarelto   On dofetilide therapy    PAF (paroxysmal atrial fibrillation) (HCC) first dx 2009   primary cardiologist-  dr Verdis Prime /  EP cardiologist -- dr Alden Hipp ([facility])  s/p  afib ablation 08-02-2016   Pancreatic cyst    x2    Peyronie's disease    Presence of permanent cardiac pacemaker implanted 05-01-2014  at [facility]   Dr.Daubert -[facility] follows   Pulmonary nodule, right    upper and middle lobes --  per last CT stable   Type 2 diabetes mellitus treated with insulin (HCC)    Wears glasses     Past Surgical History:  Procedure Laterality Date   ANTERIOR CERVICAL DECOMP/DISCECTOMY FUSION N/A 06/02/2016   Procedure: ANTERIOR CERVICAL DECOMPRESSION FUSION CERVICAL 5-6, CERVICAL 6-7 WITH INSTRUMENTATION AND ALLOGRAFT;  Surgeon: Estill Bamberg, MD;  Location: [facility] OR;  Service: Orthopedics;  Laterality: N/A;  ANTERIOR CERVICAL DECOMPRESSION FUSION CERVICAL 5-6, CERVICAL 6-7 WITH INSTRUMENTATION  AND ALLOGRAFT   BIOPSY  06/26/2012   Procedure: BIOPSY;  Surgeon: Lodema Pilot, DO;  Location: [facility] ORS;  Service: General;;   CARDIAC ELECTROPHYSIOLOGY STUDY AND ABLATION  08-02-2016   [facility]   (dr Alden Hipp)   pulmonary veins isolation and ablation afib   CARDIAC PACEMAKER PLACEMENT  05-01-2014   [facility]   CHOLECYSTECTOMY  1983   COLONOSCOPY WITH PROPOFOL N/A 07/13/2015   Procedure: COLONOSCOPY WITH PROPOFOL;  Surgeon: Charolett Bumpers, MD;  Location: [facility] ENDOSCOPY;  Service: Endoscopy;  Laterality: N/A;   ESOPHAGECTOMY  08-21-2012   [facility]   w/ gastric pull-through   ESOPHAGOGASTRODUODENOSCOPY (EGD) WITH ESOPHAGEAL DILATION  multiple x32 per pt approx.--- last one 02-09-2015   recurrent anastomotic stricture post esophagectomy   HIATAL HERNIA REPAIR  06/26/2012   Procedure: LAPAROSCOPIC REPAIR OF HIATAL HERNIA;  Surgeon: Lodema Pilot, DO;  Location: [facility] ORS;  Service: General;;   INCISIONAL HERNIA REPAIR  06/26/2012   Procedure: HERNIA REPAIR INCISIONAL;  Surgeon: Lodema Pilot, DO;  Location: [facility] ORS;  Service: General;;   INGUINAL HERNIA REPAIR Bilateral 1982   JEJUNOSTOMY FEEDING TUBE  12/22/2012   Removed 12/ 2016  approx.   KNEE ARTHROSCOPY Left 1990   KNEE ARTHROSCOPY Right 12/2019   LAPAROSCOPIC GASTRIC SLEEVE RESECTION  06/2012   started but abandoned d/t discovery of tumor   LAPAROTOMY  07/12/2021  revision of previous gastric conduit, hiatal hernia repair   MASS EXCISION Left 04/04/2013   Procedure: EXCISION OF SCALP MASS;  Surgeon: Lodema Pilot, DO;  Location: [facility] ORS;  Service: General;  Laterality: Left;   pilomatrixoma   NESBIT PROCEDURE N/A 12/09/2016   Procedure: NESBIT PROCEDURE 16 DOT PLICATION;  Surgeon: Ihor Gully, MD;  Location: [facility];  Service: Urology;  Laterality: N/A;   THORACOTOMY  07/12/2021   revision of previous gastric conduit   TONSILLECTOMY  1959   TRANSTHORACIC ECHOCARDIOGRAM  08-06-2015   [facility]   mild LVH, ef 50%/  mild RAE /  moderate  LAE/  trivial AR, MR, PR,  and TR    Current Outpatient Medications  Medication Instructions   apixaban (ELIQUIS) 5 mg, Oral, 2 times daily   cyanocobalamin (VITAMIN B12) 1,000 mcg, Oral, Daily   ferrous sulfate 325 mg, Daily   FLUoxetine (PROZAC) 60 mg, Oral, Daily   HUMULIN 70/30 (70-30) 100 UNIT/ML injection 14 Units, Subcutaneous, BH-q am and hs, <BR><BR>   Insulin Syringe-Needle U-100 (BD VEO INSULIN SYRINGE U/F) 31G X 15/64" 0.3 ML MISC 4 times daily   LORazepam (ATIVAN) 1 mg, Oral, 2 times daily PRN   Metoprolol Tartrate (FIRST - METOPROLOL) 10 MG/ML SOLN 1.25 mLs, Oral, 2 times daily   Multiple Vitamins-Minerals (PRESERVISION AREDS PO) 1 tablet, Oral, Daily, Preservision AREDS   rosuvastatin (CRESTOR) 40 mg, Oral, Daily   traMADol (ULTRAM) 50 mg, Oral, At bedtime PRN       Objective:   Physical Exam BP 137/78   Pulse 77   Temp 98.2 F (36.8 C) (Oral)   Resp 18   Ht 6\' 1"  (1.854 m)   Wt 235 lb 4 oz (106.7 kg)   SpO2 97%   BMI 31.04 kg/m  General: Well developed, NAD, BMI noted HEENT:  Normocephalic . Face symmetric, atraumatic Lungs:  CTA B Normal respiratory effort, no intercostal retractions, no accessory muscle use. Heart: RRR,  no murmur.  Abdomen:  Not distended, soft, non-tender. No rebound or rigidity.   Lower extremities: no pretibial edema bilaterally  Skin: Exposed areas without rash. Not pale. Not jaundice Neurologic:  alert & oriented X3.  Speech normal, gait appropriate for age and unassisted Strength symmetric and appropriate for age.  Psych: Cognition and judgment appear intact.  Cooperative with normal attention span and concentration.  Behavior appropriate. No anxious or depressed appearing.     Assessment    Assessment (new patient, previous PCP retired) DM- per endo Mild nonproliferative diabetic retinopathy with macular edema. HTN High cholesterol Anxiety  GI: Esophageal neoplasm 2013 found during a gastric bypass  Hiatal  hernia: R thoracotomy Laparotomy lysis of adhesions for repair of paraesophageal hernia in 2022  Cardiovascular: A-fib dx 2014, anticoagulated Pacemaker 2015, Ablation @ [facility] 2017 TIA 2003 ?  MRI negative, MRA some atherosclerosis distal R vertebral artery? Orthostatic hypotension CAD: Moderate-per CT coronary angiogram 01/2023, medical mngmt [facility]: see surgeries, on tramadol   PLAN Here for CPX Tdap 2018. S/p Shingrix x2 PNM 23: 2018, 2019. PNM 20 today COVID vaccine: Last 8/ 2023 Vaccines I recommend: RSV,   COVID booster, flu shot every fall Prostate cancer screening: +FH, father ~ 27 y/o. PSA 2022 2.6 ([facility]), no symptoms.  Check PSA [facility] Colonoscopy 05/17/2021, + polyps.  Pathology: Tubular adenomas, hyperplastic polyp.  Next per [facility] POA: Documents on file HTN: Seems well-controlled on present meds High cholesterol: Well-controlled on Crestor Paroxysmal A-fib, CAD, pacemaker. Saw cardiology 03/29/2023.  No changes suggested Increase alkaline phosphatase: Previous PCP's  workup: June 15, 2022: Whole-body scan: Overall somewhat increased tracer uptake throughout the skeleton.  DDx metabolic bone disease, diffuse metastatic disease.  Follow-up rib x-ray, see results. Vitamin D and intact PTH normal + GGT, [place] 02/26/2023: Steatosis, AMA negative. Saw gastroenterology earlier this week.  Hep B and C-, GGT 153 elevated AP 164 elevated. Overall etiology unclear. Plan refer to endo, r/o metabolic bone dz. DM: Per endo, the provider he has seen up to now is leaving the area, hopefully our endocrinology team will be able to help him with DM. Gait disorder: Had few falls, he thinks is due to orthostatic hypotension, a chronic issue, and weak legs.  Recommend PT at [facility] orthopedics per patient request. RTC 4 months

## 2023-04-07 NOTE — Telephone Encounter (Signed)
Reviewed during office visit today.

## 2023-04-09 ENCOUNTER — Encounter: Payer: Self-pay | Admitting: Internal Medicine

## 2023-04-09 ENCOUNTER — Telehealth: Payer: Self-pay | Admitting: Internal Medicine

## 2023-04-09 DIAGNOSIS — Z0000 Encounter for general adult medical examination without abnormal findings: Secondary | ICD-10-CM | POA: Insufficient documentation

## 2023-04-09 NOTE — Assessment & Plan Note (Signed)
Here for CPX Tdap 2018. S/p Shingrix x2 PNM 23: 2018, 2019. PNM 20 today COVID vaccine: Last 8/ 2023 Vaccines I recommend: RSV,   COVID booster, flu shot every fall Prostate cancer screening: +FH, father ~ 71 y/o. PSA 2022 2.6 ([facility]), no symptoms.  Check PSA [facility] Colonoscopy 05/17/2021, + polyps.  Pathology: Tubular adenomas, hyperplastic polyp.  Next per [facility] POA: Documents on file

## 2023-04-09 NOTE — Assessment & Plan Note (Addendum)
Here for CPX  HTN: Seems well-controlled on present meds High cholesterol: Well-controlled on Crestor Paroxysmal A-fib, CAD, pacemaker. Saw cardiology 03/29/2023.  No changes suggested Increase alkaline phosphatase: Previous PCP's  workup: June 15, 2022: Whole-body scan: Overall somewhat increased tracer uptake throughout the skeleton.  DDx metabolic bone disease, diffuse metastatic disease.  Follow-up rib x-ray, see results. Vitamin D and intact PTH normal + GGT, [place] 02/26/2023: Steatosis, AMA negative. Saw gastroenterology earlier this week.  Hep B and C-, GGT 153 elevated AP 164 elevated. Overall etiology unclear. Plan refer to endo, r/o metabolic bone dz. DM: Per endo, the provider he has seen up to now is leaving the area, hopefully our endocrinology team will be able to help him with DM. Gait disorder: Had few falls, he thinks is due to orthostatic hypotension, a chronic issue, and weak legs.  Recommend PT at [facility] orthopedics per patient request. RTC 4 monthsnths

## 2023-04-10 ENCOUNTER — Other Ambulatory Visit: Payer: Self-pay | Admitting: Internal Medicine

## 2023-04-10 NOTE — Telephone Encounter (Signed)
Pdmp ok, rx sent  ? ?

## 2023-04-10 NOTE — Telephone Encounter (Signed)
Requesting: lorazepam 1mg  Contract: No UDS: 03/24/23 Last Visit: 04/07/2023 Next Visit: 08/21/2023 Last Refill: 03/07/23  Please Advise

## 2023-04-16 ENCOUNTER — Encounter: Payer: Self-pay | Admitting: Internal Medicine

## 2023-04-17 ENCOUNTER — Ambulatory Visit: Payer: Medicare PPO | Admitting: Physician Assistant

## 2023-04-17 VITALS — BP 148/86 | HR 85 | Temp 97.9°F | Resp 20 | Wt 235.0 lb

## 2023-04-17 DIAGNOSIS — R310 Gross hematuria: Secondary | ICD-10-CM | POA: Diagnosis not present

## 2023-04-17 LAB — POCT URINALYSIS DIP (MANUAL ENTRY)
Bilirubin, UA: NEGATIVE
Glucose, UA: >=1000 mg/dL — AB
Ketones, POC UA: NEGATIVE mg/dL
Leukocytes, UA: NEGATIVE
Nitrite, UA: NEGATIVE
Protein Ur, POC: 30 mg/dL — AB
Spec Grav, UA: 1.02 (ref 1.010–1.025)
Urobilinogen, UA: 0.2 E.U./dL
pH, UA: 6.5 (ref 5.0–8.0)

## 2023-04-17 LAB — CBC WITH DIFFERENTIAL/PLATELET
Basophils Absolute: 0.1 10*3/uL (ref 0.0–0.1)
Basophils Relative: 0.6 % (ref 0.0–3.0)
Eosinophils Absolute: 0.1 10*3/uL (ref 0.0–0.7)
Eosinophils Relative: 1.3 % (ref 0.0–5.0)
HCT: 40.1 % (ref 39.0–52.0)
Hemoglobin: 13.3 g/dL (ref 13.0–17.0)
Lymphocytes Relative: 15.9 % (ref 12.0–46.0)
Lymphs Abs: 1.6 10*3/uL (ref 0.7–4.0)
MCHC: 33.1 g/dL (ref 30.0–36.0)
MCV: 97.9 fl (ref 78.0–100.0)
Monocytes Absolute: 1 10*3/uL (ref 0.1–1.0)
Monocytes Relative: 9.8 % (ref 3.0–12.0)
Neutro Abs: 7.2 10*3/uL (ref 1.4–7.7)
Neutrophils Relative %: 72.4 % (ref 43.0–77.0)
Platelets: 209 10*3/uL (ref 150.0–400.0)
RBC: 4.1 Mil/uL — ABNORMAL LOW (ref 4.22–5.81)
RDW: 12.7 % (ref 11.5–15.5)
WBC: 9.9 10*3/uL (ref 4.0–10.5)

## 2023-04-17 LAB — URINALYSIS, MICROSCOPIC ONLY

## 2023-04-17 NOTE — Progress Notes (Signed)
Established patient visit   Patient: Lawrence [name].   DOB: 1951-08-19   72 y.o. Male  MRN: [id number] Visit Date: 04/17/2023  Today's healthcare provider: Alfredia Ferguson, PA-C   Cc. Hematuria  Subjective    HPI  Pt reports visible hematuria last night repeatedly, all BRB blood. This AM some slightly dark color, but no BRB. Denies abdominal pain, new back pain, denies dysuria. Denies fevers, aches.   On Eliquis.   Medications: Outpatient Medications Prior to Visit  Medication Sig   apixaban (ELIQUIS) 5 MG TABS tablet Take 1 tablet (5 mg total) by mouth 2 (two) times daily.   ferrous sulfate 325 (65 FE) MG tablet 325 mg daily.   FLUoxetine (PROZAC) 20 MG tablet Take 60 mg by mouth daily.   HUMULIN 70/30 (70-30) 100 UNIT/ML injection Inject 14 Units into the skin 2 (two) times daily in the am and at bedtime..   Insulin Syringe-Needle U-100 (BD VEO INSULIN SYRINGE U/F) 31G X 15/64" 0.3 ML MISC in the morning, at noon, in the evening, and at bedtime.   LORazepam (ATIVAN) 1 MG tablet TAKE 1 TABLET (1 MG TOTAL) BY MOUTH 2 (TWO) TIMES DAILY AS NEEDED FOR ANXIETY OR SLEEP.   Metoprolol Tartrate (FIRST - METOPROLOL) 10 MG/ML SOLN Take 1.25 mLs by mouth in the morning and at bedtime.   Multiple Vitamins-Minerals (PRESERVISION AREDS PO) Take 1 tablet by mouth daily. Preservision AREDS   rosuvastatin (CRESTOR) 40 MG tablet Take 40 mg by mouth daily.   traMADol (ULTRAM) 50 MG tablet Take 1 tablet (50 mg total) by mouth at bedtime as needed for moderate pain.   vitamin B-12 (CYANOCOBALAMIN) 1000 MCG tablet Take 1,000 mcg by mouth daily.   No facility-administered medications prior to visit.    Review of Systems  Constitutional:  Negative for fatigue and fever.  Respiratory:  Negative for cough and shortness of breath.   Cardiovascular:  Negative for chest pain, palpitations and leg swelling.  Genitourinary:  Positive for hematuria.  Neurological:  Negative for dizziness and  headaches.      Objective    BP (!) 148/86 (BP Location: Left Arm, Patient Position: Sitting, Cuff Size: Normal)   Pulse 85   Temp 97.9 F (36.6 C)   Resp 20   Wt 235 lb (106.6 kg)   SpO2 98%   BMI 31.00 kg/m    Physical Exam Constitutional:      General: He is awake.     Appearance: He is well-developed.  HENT:     Head: Normocephalic.  Eyes:     Conjunctiva/sclera: Conjunctivae normal.  Cardiovascular:     Rate and Rhythm: Normal rate and regular rhythm.     Heart sounds: Normal heart sounds.  Pulmonary:     Effort: Pulmonary effort is normal.     Breath sounds: Normal breath sounds.  Skin:    General: Skin is warm.  Neurological:     Mental Status: He is alert and oriented to person, place, and time.  Psychiatric:        Attention and Perception: Attention normal.        Mood and Affect: Mood normal.        Speech: Speech normal.        Behavior: Behavior is cooperative.      Results for orders placed or performed in visit on 04/17/23  POCT urinalysis dipstick  Result Value Ref Range   Color, UA yellow yellow   Clarity,  UA clear clear   Glucose, UA >=1,000 (A) negative mg/dL   Bilirubin, UA negative negative   Ketones, POC UA negative negative mg/dL   Spec Grav, UA 1.027 2.536 - 1.025   Blood, UA small (A) negative   pH, UA 6.5 5.0 - 8.0   Protein Ur, POC =30 (A) negative mg/dL   Urobilinogen, UA 0.2 0.2 or 1.0 E.U./dL   Nitrite, UA Negative Negative   Leukocytes, UA Negative Negative    Assessment & Plan     1. Gross hematuria UA + blood  Sending for culture, urine micro Given eliquis use, will check cbc for anemia   If culture negative will ref to urology.  - POCT urinalysis dipstick - Urine Culture - Urine Microscopic Only - CBC w/Diff   Return if symptoms worsen or fail to improve.      I, Alfredia Ferguson, PA-C have reviewed all documentation for this visit. The documentation on  04/17/23   for the exam, diagnosis, procedures, and  orders are all accurate and complete.    Alfredia Ferguson, PA-C  [facility] Primary Care at [facility] [phone] (phone) [phone] (fax)  [facility]

## 2023-04-19 ENCOUNTER — Telehealth: Payer: Self-pay | Admitting: Internal Medicine

## 2023-04-19 ENCOUNTER — Other Ambulatory Visit: Payer: Self-pay | Admitting: Physician Assistant

## 2023-04-19 DIAGNOSIS — R310 Gross hematuria: Secondary | ICD-10-CM

## 2023-04-19 NOTE — Telephone Encounter (Signed)
Pt called stating that he would like to have his referral rerouted to the following provider:  Di Kindle, MD [facility] at [facility] [address] [place], [place] [address] P: [phone]

## 2023-04-20 NOTE — Addendum Note (Signed)
Addended byAlfredia Ferguson on: 04/20/2023 01:02 PM   Modules accepted: Orders

## 2023-04-21 ENCOUNTER — Telehealth: Payer: Self-pay

## 2023-04-21 ENCOUNTER — Telehealth: Payer: Self-pay | Admitting: Urology

## 2023-04-21 NOTE — Telephone Encounter (Signed)
PT plan of care signed and faxed back to [facility] at [phone]. Form sent for scanning.

## 2023-04-21 NOTE — Telephone Encounter (Signed)
LVM to sch

## 2023-04-25 ENCOUNTER — Emergency Department (HOSPITAL_BASED_OUTPATIENT_CLINIC_OR_DEPARTMENT_OTHER)
Admission: EM | Admit: 2023-04-25 | Discharge: 2023-04-25 | Disposition: A | Discharge status: Home / Self Care | Payer: Medicare PPO

## 2023-04-25 ENCOUNTER — Encounter: Payer: Self-pay | Admitting: Internal Medicine

## 2023-04-25 ENCOUNTER — Other Ambulatory Visit: Payer: Self-pay

## 2023-04-25 ENCOUNTER — Encounter (HOSPITAL_BASED_OUTPATIENT_CLINIC_OR_DEPARTMENT_OTHER): Payer: Self-pay | Admitting: Urology

## 2023-04-25 DIAGNOSIS — Z7901 Long term (current) use of anticoagulants: Secondary | ICD-10-CM | POA: Insufficient documentation

## 2023-04-25 DIAGNOSIS — K921 Melena: Secondary | ICD-10-CM | POA: Insufficient documentation

## 2023-04-25 DIAGNOSIS — Z79899 Other long term (current) drug therapy: Secondary | ICD-10-CM | POA: Insufficient documentation

## 2023-04-25 DIAGNOSIS — D649 Anemia, unspecified: Secondary | ICD-10-CM | POA: Insufficient documentation

## 2023-04-25 DIAGNOSIS — Z8501 Personal history of malignant neoplasm of esophagus: Secondary | ICD-10-CM | POA: Insufficient documentation

## 2023-04-25 DIAGNOSIS — Z794 Long term (current) use of insulin: Secondary | ICD-10-CM | POA: Insufficient documentation

## 2023-04-25 DIAGNOSIS — E119 Type 2 diabetes mellitus without complications: Secondary | ICD-10-CM | POA: Insufficient documentation

## 2023-04-25 DIAGNOSIS — D689 Coagulation defect, unspecified: Secondary | ICD-10-CM

## 2023-04-25 LAB — COMPREHENSIVE METABOLIC PANEL
ALT: 54 U/L — ABNORMAL HIGH (ref 0–44)
AST: 60 U/L — ABNORMAL HIGH (ref 15–41)
Albumin: 3.6 g/dL (ref 3.5–5.0)
Alkaline Phosphatase: 138 U/L — ABNORMAL HIGH (ref 38–126)
Anion gap: 9 (ref 5–15)
BUN: 19 mg/dL (ref 8–23)
CO2: 28 mmol/L (ref 22–32)
Calcium: 9.2 mg/dL (ref 8.9–10.3)
Chloride: 98 mmol/L (ref 98–111)
Creatinine, Ser: 1.3 mg/dL — ABNORMAL HIGH (ref 0.61–1.24)
GFR, Estimated: 59 mL/min — ABNORMAL LOW (ref >60)
Glucose, Bld: 193 mg/dL — ABNORMAL HIGH (ref 70–99)
Potassium: 5.3 mmol/L — ABNORMAL HIGH (ref 3.5–5.1)
Sodium: 135 mmol/L (ref 135–145)
Total Bilirubin: 0.6 mg/dL (ref 0.3–1.2)
Total Protein: 7.2 g/dL (ref 6.5–8.1)

## 2023-04-25 LAB — CBC
HCT: 40 % (ref 39.0–52.0)
Hemoglobin: 13.4 g/dL (ref 13.0–17.0)
MCH: 32.2 pg (ref 26.0–34.0)
MCHC: 33.5 g/dL (ref 30.0–36.0)
MCV: 96.2 fL (ref 80.0–100.0)
Platelets: 180 10*3/uL (ref 150–400)
RBC: 4.16 MIL/uL — ABNORMAL LOW (ref 4.22–5.81)
RDW: 12.1 % (ref 11.5–15.5)
WBC: 10 10*3/uL (ref 4.0–10.5)
nRBC: 0 % (ref 0.0–0.2)

## 2023-04-25 LAB — OCCULT BLOOD X 1 CARD TO LAB, STOOL: Fecal Occult Bld: NEGATIVE

## 2023-04-25 NOTE — ED Provider Notes (Signed)
[facility] AT [facility] Provider Note   CSN: [id number] Arrival date & time: 04/25/23  1129     History  Chief Complaint  Patient presents with   Melena    Lawrence Forth. is a 72 y.o. male.  Patient with history of T2DM, coagulopathy while on Eliquis, esophageal CA s/p esophagectomy, presents with dark, tarry stools x 4-5 days. No significant abdominal pain or bloating. No nausea or vomiting. No fever. No history of GI bleeding in the past per patient. Recent hematuria 3 days ago that has cleared. Today he broke out into a sweat and became lightheaded while at physical therapy, prompting ED evaluation. No syncope.  The history is provided by the patient. No language interpreter was used.       Home Medications Prior to Admission medications   Medication Sig Start Date End Date Taking? Authorizing Provider  apixaban (ELIQUIS) 5 MG TABS tablet Take 1 tablet (5 mg total) by mouth 2 (two) times daily. 03/08/23   Lewayne Bunting, MD  ferrous sulfate 325 (65 FE) MG tablet 325 mg daily.    [provider]  FLUoxetine (PROZAC) 20 MG tablet Take 60 mg by mouth daily.    [provider]  HUMULIN 70/30 (70-30) 100 UNIT/ML injection Inject 14 Units into the skin 2 (two) times daily in the am and at bedtime.. 06/01/22   [provider]  Insulin Syringe-Needle U-100 (BD VEO INSULIN SYRINGE U/F) 31G X 15/64" 0.3 ML MISC in the morning, at noon, in the evening, and at bedtime.    [provider]  LORazepam (ATIVAN) 1 MG tablet TAKE 1 TABLET (1 MG TOTAL) BY MOUTH 2 (TWO) TIMES DAILY AS NEEDED FOR ANXIETY OR SLEEP. 04/10/23   Wanda Plump, MD  Metoprolol Tartrate (FIRST - METOPROLOL) 10 MG/ML SOLN Take 1.25 mLs by mouth in the morning and at bedtime. 01/25/23   Wanda Plump, MD  Multiple Vitamins-Minerals (PRESERVISION AREDS PO) Take 1 tablet by mouth daily. Preservision AREDS    [provider]  rosuvastatin (CRESTOR) 40  MG tablet Take 40 mg by mouth daily. 01/25/23   Wanda Plump, MD  traMADol (ULTRAM) 50 MG tablet Take 1 tablet (50 mg total) by mouth at bedtime as needed for moderate pain. 03/07/23   Wanda Plump, MD  vitamin B-12 (CYANOCOBALAMIN) 1000 MCG tablet Take 1,000 mcg by mouth daily.    [provider]      Allergies    Chlorthalidone, Lisinopril, and Metformin hcl    Review of Systems   Review of Systems  Physical Exam Updated Vital Signs BP (!) 141/91 (BP Location: Left Arm)   Pulse 72   Temp (!) 97.2 F (36.2 C) (Oral)   Resp 18   Ht 6\' 1"  (1.854 m)   Wt 107 kg   SpO2 99%   BMI 31.14 kg/m  Physical Exam Vitals and nursing note reviewed.  Constitutional:      Appearance: Normal appearance. He is not ill-appearing.  Eyes:     Conjunctiva/sclera: Conjunctivae normal.  Cardiovascular:     Rate and Rhythm: Normal rate and regular rhythm.  Pulmonary:     Effort: Pulmonary effort is normal.  Abdominal:     General: There is no distension.     Palpations: Abdomen is soft. There is no mass.     Tenderness: There is no abdominal tenderness.  Genitourinary:    Rectum: Normal.     Comments: Stool  brown in color. No gross blood.  Musculoskeletal:     Cervical back: Normal range of motion and neck supple.  Skin:    General: Skin is warm and dry.     Coloration: Skin is not pale.  Neurological:     Mental Status: He is alert and oriented to person, place, and time.     ED Results / Procedures / Treatments   Labs (all labs ordered are listed, but only abnormal results are displayed) Labs Reviewed  COMPREHENSIVE METABOLIC PANEL  CBC  OCCULT BLOOD X 1 CARD TO LAB, STOOL    EKG None  Radiology No results found.  Procedures Procedures    Medications Ordered in ED Medications - No data to display  ED Course/ Medical Decision Making/ A&P                                 Medical Decision Making Patient remains asymptomatic during ED encounter. Vitals stable  without hypotension, tachycardia or hypoxia. Abdominal exam benign. Hemoccult negative study, and hemoglobin stable. The patient has been seen by myself and Dr. Maple Hudson and is felt appropriate for discharge home. Will refer to GI ([facility]) for further evaluation. Discussed ss/sxs that should prompt rapid return to the ED and patient acknowledges understanding.   Amount and/or Complexity of Data Reviewed Independent Historian: parent Labs: ordered.    Details: Hemoglobin 13 on 04/17/23. 13.4 today ECG/medicine tests: independent interpretation performed.           Final Clinical Impression(s) / ED Diagnoses Final diagnoses:  None    Rx / DC Orders ED Discharge Orders     None         Elpidio Anis, PA-C 04/25/23 1418    Coral Spikes, DO 04/25/23 1526

## 2023-04-25 NOTE — Discharge Instructions (Signed)
Based on your history of dark stool over the last several days, there is a concern for bleeding in the GI tract which will require follow up with your GI doctor. If your symptoms worsen or if there is new concern, return to the ED for further evaluation.

## 2023-04-25 NOTE — ED Notes (Addendum)
Following low BP standing, had pt sit on side of bed again and BP sitting was 130/84, and repeated standing BP was 108/69.

## 2023-04-25 NOTE — ED Triage Notes (Signed)
Pt states last 5 days has had dark Tarry stools  Denies pain, feeling of fatigue  Takes eliquis    States liver tests are elevated with pcp and has f/u appointment

## 2023-04-25 NOTE — Telephone Encounter (Signed)
Discussed with Dr. Drue Novel- per PCP- Pt is anti-coagulated, having black tarry stools, he needs to be evaluated in ED.    Tried calling Pt- no answer, LMOM informing to report to ED for evaluation. Will also send mychart message.

## 2023-04-25 NOTE — Telephone Encounter (Signed)
Pt currently in ED.

## 2023-04-27 ENCOUNTER — Telehealth: Payer: Self-pay | Admitting: Endocrinology

## 2023-04-27 NOTE — Telephone Encounter (Signed)
Patient dropped off form to have medical record request from [facility]. Request Faxed  04/27/2023

## 2023-05-02 ENCOUNTER — Encounter: Payer: Self-pay | Admitting: Urology

## 2023-05-02 ENCOUNTER — Telehealth: Payer: Self-pay | Admitting: *Deleted

## 2023-05-02 ENCOUNTER — Telehealth: Payer: Self-pay | Admitting: Urology

## 2023-05-02 ENCOUNTER — Ambulatory Visit: Payer: Medicare PPO | Admitting: Urology

## 2023-05-02 VITALS — BP 102/68 | HR 82 | Ht 73.0 in | Wt 236.0 lb

## 2023-05-02 DIAGNOSIS — R310 Gross hematuria: Secondary | ICD-10-CM | POA: Diagnosis not present

## 2023-05-02 NOTE — Telephone Encounter (Signed)
   Pre-operative Risk Assessment    Patient Name: Lawrence [name].  DOB: [birth year] MRN: [id number]    DATE OF LAST VISIT: 03/29/23 DR. CRENSHAW DATE OF NEXT VISIT: NONE  Request for Surgical Clearance    Procedure:   ENDOSCOPY ; MELENA  Date of Surgery:  Clearance 05/10/23  ASAP                               Surgeon:  DR. Chancy Milroy Surgeon's Group or Practice Name:  [facility] Phone number:  [phone] Fax number:  [phone]   Type of Clearance Requested:   - Medical  - Pharmacy:  Hold Apixaban (Eliquis) x 2 DAYS PRIOR   Type of Anesthesia:   PROPOFOL   Additional requests/questions:    Elpidio Anis   05/02/2023, 10:32 AM

## 2023-05-02 NOTE — Telephone Encounter (Signed)
Please advise holding Eliquis prior to endoscopy on 05/10/2023.  Thank you!  DW

## 2023-05-02 NOTE — Telephone Encounter (Signed)
Patient with diagnosis of afib on Eliquis for anticoagulation.    Procedure: endoscopy Date of procedure: 05/10/23  CHA2DS2-VASc Score = 6  This indicates a 9.7% annual risk of stroke. The patient's score is based upon: CHF History: 0 HTN History: 1 Diabetes History: 1 Stroke History: 2 Vascular Disease History: 1 Age Score: 1 Gender Score: 0   TIA in 2003.  CrCl 107mL/min using adjusted body weight Platelet count 180K  Per office protocol, patient can hold Eliquis for 2 days prior to procedure as requested.    **This guidance is not considered finalized until pre-operative APP has relayed final recommendations.**

## 2023-05-02 NOTE — Telephone Encounter (Signed)
Dr. Jens Som,  You saw this patient on 03/29/2023. Will you please comment on medical clearance for endoscopy?  Please route your response to P CV DIV Preop. I will communicate with requesting office once you have given recommendations.   Thank you!  Carlos Levering, NP

## 2023-05-02 NOTE — Telephone Encounter (Signed)
Follow-up disposition: Return in about 2 weeks (around 05/16/2023) for cystoscopy.  Check out comments: Schedule for CT hematuria study prior to cystoscopy appointment    Pt is going to call to sch after scheduling the CT.

## 2023-05-02 NOTE — Progress Notes (Signed)
Assessment: 1. Gross hematuria      Plan: I personally reviewed the patient's chart including provider notes, and lab results. Today I had a discussion with the patient regarding the findings of gross hematuria including the implications and differential diagnoses associated with it.  I also discussed recommendations for further evaluation including the rationale for upper tract imaging and cystoscopy.  I discussed the nature of these procedures including potential risk and complications.  The patient expressed an understanding of these issues. Schedule for CT hematuria protocol and cystoscopy for further evaluation of gross hematuria   Chief Complaint:  Chief Complaint  Patient presents with   Hematuria    History of Present Illness:  Lawrence Mook. is a 72 y.o. male who is seen in consultation from Wanda Plump, MD for evaluation of gross hematuria.  He had acute onset of painless gross hematuria on 04/16/2023.  His urine was bright red in color without clots.  The gross hematuria gradually resolved over the next 3 voids.  No further episodes of gross hematuria.  No flank pain or abdominal pain.  No fevers or chills.  No recent imaging studies.  U/A from 04/17/23:  7-10 RBC Urine culture from 04/17/23:  no growth PSA from 04/07/23:  2.88  He does have mild lower urinary tract symptoms including urgency, nocturia x 1, and occasional decreased force of stream. IPSS = 9.  He has a remote history of a kidney stone. No history of tobacco use. He has a family history of prostate cancer and kidney cancer with his father.  Past Medical History:  Past Medical History:  Diagnosis Date   Anxiety 01/20/2023   Bradycardia    Depression    GERD (gastroesophageal reflux disease)    H/O hiatal hernia    s/p  repair 06-26-2012   History of esophagectomy 08-21-2012  AT [facility]   History of kidney stones 2006   History of malignant neoplasm of esophagus ONCOLOGIST-  DR D'AMICO AT [facility]--  PER LAST NOTE 01/ 2018  NO RECURRENCE   dx 10/ 2013  Stage 2A (T2 N0)  08-21-2012 s/p  esophagectomy w/ gastric pull-through [facility]) post residual recurrent anastomotic stricture (multiple EGD w/ dilations)   History of transient ischemic attack (TIA) 2003   no residual   Hypertension    Long term (current) use of anticoagulants    xarelto   On dofetilide therapy    PAF (paroxysmal atrial fibrillation) (HCC) first dx 2009   primary cardiologist-  dr Verdis Prime /  EP cardiologist -- dr Alden Hipp ([facility])  s/p  afib ablation 08-02-2016   Pancreatic cyst    x2    Peyronie's disease    Presence of permanent cardiac pacemaker implanted 05-01-2014  at [facility]   Dr.Daubert -[facility] follows   Pulmonary nodule, right    upper and middle lobes --  per last CT stable   Type 2 diabetes mellitus treated with insulin (HCC)    Wears glasses     Past Surgical History:  Past Surgical History:  Procedure Laterality Date   ANTERIOR CERVICAL DECOMP/DISCECTOMY FUSION N/A 06/02/2016   Procedure: ANTERIOR CERVICAL DECOMPRESSION FUSION CERVICAL 5-6, CERVICAL 6-7 WITH INSTRUMENTATION AND ALLOGRAFT;  Surgeon: Estill Bamberg, MD;  Location: [facility] OR;  Service: Orthopedics;  Laterality: N/A;  ANTERIOR CERVICAL DECOMPRESSION FUSION CERVICAL 5-6, CERVICAL 6-7 WITH INSTRUMENTATION AND ALLOGRAFT   BIOPSY  06/26/2012   Procedure: BIOPSY;  Surgeon: Lodema Pilot, DO;  Location: [facility] ORS;  Service: General;;  CARDIAC ELECTROPHYSIOLOGY STUDY AND ABLATION  08-02-2016   [facility]   (dr Alden Hipp)   pulmonary veins isolation and ablation afib   CARDIAC PACEMAKER PLACEMENT  05-01-2014   [facility]   CHOLECYSTECTOMY  1983   COLONOSCOPY WITH PROPOFOL N/A 07/13/2015   Procedure: COLONOSCOPY WITH PROPOFOL;  Surgeon: Charolett Bumpers, MD;  Location: [facility] ENDOSCOPY;  Service: Endoscopy;  Laterality: N/A;   ESOPHAGECTOMY  08-21-2012   [facility]   w/ gastric pull-through   ESOPHAGOGASTRODUODENOSCOPY (EGD) WITH ESOPHAGEAL DILATION  multiple x32  per pt approx.--- last one 02-09-2015   recurrent anastomotic stricture post esophagectomy   HIATAL HERNIA REPAIR  06/26/2012   Procedure: LAPAROSCOPIC REPAIR OF HIATAL HERNIA;  Surgeon: Lodema Pilot, DO;  Location: [facility] ORS;  Service: General;;   INCISIONAL HERNIA REPAIR  06/26/2012   Procedure: HERNIA REPAIR INCISIONAL;  Surgeon: Lodema Pilot, DO;  Location: [facility] ORS;  Service: General;;   INGUINAL HERNIA REPAIR Bilateral 1982   JEJUNOSTOMY FEEDING TUBE  12/22/2012   Removed 12/ 2016  approx.   KNEE ARTHROSCOPY Left 1990   KNEE ARTHROSCOPY Right 12/2019   LAPAROSCOPIC GASTRIC SLEEVE RESECTION  06/2012   started but abandoned d/t discovery of tumor   LAPAROTOMY  07/12/2021   revision of previous gastric conduit, hiatal hernia repair   MASS EXCISION Left 04/04/2013   Procedure: EXCISION OF SCALP MASS;  Surgeon: Lodema Pilot, DO;  Location: [facility]S;  Service: General;  Laterality: Left;   pilomatrixoma   NESBIT PROCEDURE N/A 12/09/2016   Procedure: NESBIT PROCEDURE 16 DOT PLICATION;  Surgeon: Ihor Gully, MD;  Location[facility];  Service: Urology;  Laterality: N/A;   THORACOTOMY  07/12/2021   revision of previous gastric conduit   TONSILLECTOMY  1959   TRANSTHORACIC ECHOCARDIOGRAM  08-06-2015   [facility]   mild LVH, ef 50%/  mild RAE /  moderate LAE/  trivial AR, MR, PR,  and TR    Allergies:  Allergies  Allergen Reactions   Chlorthalidone Other (See Comments)    urinary urgency   Lisinopril Other (See Comments)    cough   Metformin Hcl Other (See Comments)    fecal urgency    Family History:  Family History  Problem Relation Age of Onset   Hypertension Mother    Heart failure Mother    Prostate cancer Father    Kidney cancer Father    Emphysema Father    Aortic aneurysm Father    Anorexia nervosa Sister    Depression Sister    Anorexia nervosa Sister    Heart disease Sister    Hypertension Brother    Cancer Maternal Grandmother    Heart attack Maternal  Grandfather    Heart disease Maternal Grandfather    Cancer Paternal Grandmother    Alcohol abuse Paternal Grandfather    Early death Paternal Grandfather    Tuberculosis Paternal Grandfather    Colon cancer Neg Hx     Social History:  Social History   Tobacco Use   Smoking status: Never   Smokeless tobacco: Never  Substance Use Topics   Alcohol use: Yes    Comment: socially   Drug use: Never    Review of symptoms:  Constitutional:  Negative for unexplained weight loss, night sweats, fever, chills ENT:  Negative for nose bleeds, sinus pain, painful swallowing CV:  Negative for chest pain, shortness of breath, exercise intolerance, palpitations, loss of consciousness Resp:  Negative for cough, wheezing, shortness of breath GI:  Negative for nausea, vomiting,  diarrhea, bloody stools GU:  Positives noted in HPI; otherwise negative for dysuria, urinary incontinence Neuro:  Negative for seizures, poor balance, limb weakness, slurred speech Psych:  Negative for lack of energy, depression, anxiety Endocrine:  Negative for polydipsia, polyuria, symptoms of hypoglycemia (dizziness, hunger, sweating) Hematologic:  Negative for anemia, purpura, petechia, prolonged or excessive bleeding, use of anticoagulants  Allergic:  Negative for difficulty breathing or choking as a result of exposure to anything; no shellfish allergy; no allergic response (rash/itch) to materials, foods  Physical exam: BP 102/68   Pulse 82   Ht 6\' 1"  (1.854 m)   Wt 236 lb (107 kg)   BMI 31.14 kg/m  GENERAL APPEARANCE:  Well appearing, well developed, well nourished, NAD HEENT: Atraumatic, Normocephalic, oropharynx clear. NECK: Supple without lymphadenopathy or thyromegaly. LUNGS: Clear to auscultation bilaterally. HEART: Regular Rate and Rhythm without murmurs, gallops, or rubs. ABDOMEN: Soft, non-tender, No Masses. EXTREMITIES: Moves all extremities well.  Without clubbing, cyanosis, or edema. NEUROLOGIC:   Alert and oriented x 3, normal gait, CN II-XII grossly intact.  MENTAL STATUS:  Appropriate. BACK:  Non-tender to palpation.  No CVAT SKIN:  Warm, dry and intact.   GU: Penis:  uncircumcised Meatus: Normal Scrotum: normal, no masses Testis: normal without masses bilateral Epididymis: normal Prostate: 50 g, NT, no nodules Rectum: Normal tone,  no masses or tenderness   Results: U/A:  0-5 WBC, 0-2 RBC, few bacteria

## 2023-05-03 ENCOUNTER — Encounter: Payer: Self-pay | Admitting: Endocrinology

## 2023-05-03 ENCOUNTER — Ambulatory Visit: Payer: Medicare PPO | Admitting: Endocrinology

## 2023-05-03 VITALS — BP 120/60 | HR 79 | Ht 73.0 in | Wt 235.8 lb

## 2023-05-03 DIAGNOSIS — E11319 Type 2 diabetes mellitus with unspecified diabetic retinopathy without macular edema: Secondary | ICD-10-CM

## 2023-05-03 DIAGNOSIS — E1142 Type 2 diabetes mellitus with diabetic polyneuropathy: Secondary | ICD-10-CM | POA: Diagnosis not present

## 2023-05-03 DIAGNOSIS — Z794 Long term (current) use of insulin: Secondary | ICD-10-CM

## 2023-05-03 MED ORDER — HUMULIN 70/30 (70-30) 100 UNIT/ML ~~LOC~~ SUSP: SUBCUTANEOUS | 4 refills | Status: DC

## 2023-05-03 NOTE — Progress Notes (Signed)
Outpatient Endocrinology Note [place] Rithika Seel, MD  05/03/23  Patient's Name: Lawrence [name].    DOB: Feb 01, 1951    MRN: [id number]                                                    REASON OF VISIT: New consult of type 2 diabetes mellitus /elevated alkaline phosphatase  REFERRING PROVIDER: Wanda Plump, MD  PCP: Wanda Plump, MD  HISTORY OF PRESENT ILLNESS:   Lawrence [name]. is a 72 y.o. old male with past medical history listed below, is here for evaluation of  type 2 diabetes mellitus /elevated alkaline phosphatase.   Pertinent Diabetes History: Patient was diagnosed with type 2 diabetes mellitus in 2013 at that time hemoglobin A1c was 8.6%.  From 2022 he was following with [facility] for the diabetes care, he was hospitalized postoperatively at that time for surgical intervention for post esophagectomy anastomotic leak, he was also doing telemedicine visits as well, he was last time seen in March 2024. He wants to transfer over his diabetes care in this clinic.  Patient has history of stage II esophageal adenocarcinoma status post exploratory laparotomy for transhiatal esophagogastrectomy in 2013.  Chronic Diabetes Complications : Retinopathy: yes. Following with ophthalmology / retinal specialist, every 6 months.  Nephropathy: no Peripheral neuropathy: yes, numbness and tingling + Coronary artery disease: no Stroke: no  Relevant comorbidities and cardiovascular risk factors: Obesity: yes Body mass index is 31.11 kg/m.  Hypertension: yes Hyperlipidemia. Yes, on statin   Current / Home Diabetic regimen includes:  Humulin 14 units with breakfast and supper.  Prior diabetic medications: Metformin, stopped due to diarrhea.   Glycemic data:    CONTINUOUS GLUCOSE MONITORING SYSTEM (CGMS) INTERPRETATION: At today's visit, we reviewed CGM downloads. The full report is scanned in the media. Reviewing the CGM trends, blood glucose are as follows:  FreeStyle Libre 3  CGM-  Sensor Download (Sensor download was reviewed and summarized below.) Dates: August 15 to May 03, 2023, 14 days Sensor Average: 178  Glucose Management Indicator: 7.6% Glucose Variability: 32.8%  % data captured: 65  Glycemic Trends:  <54: 0% 54-70: 0% 71-180: 59% 181-250: 27% 251-400: 14%  Interpretation: -Most of the blood sugar in between the meals and overnight acceptable.  He has trending down blood sugar and at times low normal blood sugar up to 70s by the early morning.  One time he had blood sugar 61 around 4 AM.  He has frequent hyperglycemia especially with supper and some time with lunch with blood sugar up to 260-300 range.  No significant hypoglycemia.  Hypoglycemia: Patient has no hypoglycemic episodes. Patient has hypoglycemia awareness.  Factors modifying glucose control: 1.  Diabetic diet assessment: He generally eats a small frequent meals.  He has habits of drinking wine daily and cutting down.  2.  Staying active or exercising:   3.  Medication compliance: compliant all of the time.  # Elevated alkaline phosphatase :  -Patient has elevated alkaline phosphatase since August 2023.  Previously he had normal alkaline phosphatase.. Bone scan showed overall increased tracer uptake throughout the axial and appendicular skeleton raising question of metabolic bone disease.  Alkaline phosphatase 138 to 171 with upper normal limit of 126.  Mildly elevated liver enzyme AST and ALT.  Elevated GGT, 1 36-141 with upper  normal limit of 65. -He has normal serum calcium.  Normal vitamin D level.  Normal PSA. -Patient has been drinking alcohol significantly mainly wine almost every day. -Patient had seen gastroenterology at [facility] on April 28, 2023 for further evaluation of elevated liver enzymes and elevated alkaline phosphatase.  Patient reports that the plan for EGD.  Interval history 05/03/23 Patient presented for the establishing care for diabetes.   And also for evaluation of elevated alkaline phosphatase.  He was also evaluated by gastroenterology recently for elevated alkaline phosphatase and liver enzymes.  He has no complaints today.  Diabetes regimen and CGM data as reviewed above.  REVIEW OF SYSTEMS As per history of present illness.   PAST MEDICAL HISTORY: Past Medical History:  Diagnosis Date   Anxiety 01/20/2023   Bradycardia    Depression    GERD (gastroesophageal reflux disease)    H/O hiatal hernia    s/p  repair 06-26-2012   History of esophagectomy 08-21-2012  AT [facility]   History of kidney stones 2006   History of malignant neoplasm of esophagus ONCOLOGIST-  DR D'AMICO AT [facility]-- PER LAST NOTE 01/ 2018  NO RECURRENCE   dx 10/ 2013  Stage 2A (T2 N0)  08-21-2012 s/p  esophagectomy w/ gastric pull-through [facility]) post residual recurrent anastomotic stricture (multiple EGD w/ dilations)   History of transient ischemic attack (TIA) 2003   no residual   Hypertension    Long term (current) use of anticoagulants    xarelto   On dofetilide therapy    PAF (paroxysmal atrial fibrillation) (HCC) first dx 2009   primary cardiologist-  dr Verdis Prime /  EP cardiologist -- dr Alden Hipp ([facility])  s/p  afib ablation 08-02-2016   Pancreatic cyst    x2    Peyronie's disease    Presence of permanent cardiac pacemaker implanted 05-01-2014  at [facility]   Dr.Daubert -[facility] follows   Pulmonary nodule, right    upper and middle lobes --  per last CT stable   Type 2 diabetes mellitus treated with insulin (HCC)    Wears glasses     PAST SURGICAL HISTORY: Past Surgical History:  Procedure Laterality Date   ANTERIOR CERVICAL DECOMP/DISCECTOMY FUSION N/A 06/02/2016   Procedure: ANTERIOR CERVICAL DECOMPRESSION FUSION CERVICAL 5-6, CERVICAL 6-7 WITH INSTRUMENTATION AND ALLOGRAFT;  Surgeon: Estill Bamberg, MD;  Location: [facility] OR;  Service: Orthopedics;  Laterality: N/A;  ANTERIOR CERVICAL DECOMPRESSION FUSION CERVICAL 5-6, CERVICAL 6-7  WITH INSTRUMENTATION AND ALLOGRAFT   BIOPSY  06/26/2012   Procedure: BIOPSY;  Surgeon: Lodema Pilot, DO;  Location: [facility] ORS;  Service: General;;   CARDIAC ELECTROPHYSIOLOGY STUDY AND ABLATION  08-02-2016   [facility]   (dr Alden Hipp)   pulmonary veins isolation and ablation afib   CARDIAC PACEMAKER PLACEMENT  05-01-2014   [facility]   CHOLECYSTECTOMY  1983   COLONOSCOPY WITH PROPOFOL N/A 07/13/2015   Procedure: COLONOSCOPY WITH PROPOFOL;  Surgeon: Charolett Bumpers, MD;  Location: [facility] END[facility]COPY;  Service: Endoscopy;  Laterality: N/A;   ESOPHAGECTOMY  08-21-2012   [facility]   w/ gastric pull-through   ESOPHAGOGASTRODUODENOSCOPY (EGD) WITH ESOPHAGEAL DILATION  multiple x32 per pt approx.--- last one 02-09-2015   recurrent anastomotic stricture post esophagectomy   HIATAL HERNIA REPAIR  06/26/2012   Procedure: LAPAROSCOPIC REPAIR OF HIATAL HERNIA;  Surgeon: Lodema Pilot, DO;  Location: [facility] ORS;  Service: General;;   INCISIONAL HERNIA REPAIR  06/26/2012   Procedure: HERNIA REPAIR INCISIONAL;  Surgeon: Lodema Pilot, DO;  Location: [facility]  ORS;  Service: General;;   INGUINAL HERNIA REPAIR Bilateral 1982   JEJUNOSTOMY FEEDING TUBE  12/22/2012   Removed 12/ 2016  approx.   KNEE ARTHROSCOPY Left 1990   KNEE ARTHROSCOPY Right 12/2019   LAPAROSCOPIC GASTRIC SLEEVE RESECTION  06/2012   started but abandoned d/t discovery of tumor   LAPAROTOMY  07/12/2021   revision of previous gastric conduit, hiatal hernia repair   MASS EXCISION Left 04/04/2013   Procedure: EXCISION OF SCALP MASS;  Surgeon: Lodema Pilot, DO;  Location: [facility] ORS;  Service: General;  Laterality: Left;   pilomatrixoma   NESBIT PROCEDURE N/A 12/09/2016   Procedure: NESBIT PROCEDURE 16 DOT PLICATION;  Surgeon: Ihor Gully, MD;  Location[facility];  Service: Urology;  Laterality: N/A;   THORACOTOMY  07/12/2021   revision of previous gastric conduit   TONSILLECTOMY  1959   TRANSTHORACIC ECHOCARDIOGRAM  08-06-2015   [facility]   mild LVH, ef 50%/   mild RAE /  moderate LAE/  trivial AR, MR, PR,  and TR    ALLERGIES: Allergies  Allergen Reactions   Chlorthalidone Other (See Comments)    urinary urgency   Lisinopril Other (See Comments)    cough   Metformin Hcl Other (See Comments)    fecal urgency    FAMILY HISTORY:  Family History  Problem Relation Age of Onset   Hypertension Mother    Heart failure Mother    Prostate cancer Father    Kidney cancer Father    Emphysema Father    Aortic aneurysm Father    Anorexia nervosa Sister    Depression Sister    Anorexia nervosa Sister    Heart disease Sister    Hypertension Brother    Cancer Maternal Grandmother    Heart attack Maternal Grandfather    Heart disease Maternal Grandfather    Cancer Paternal Grandmother    Alcohol abuse Paternal Grandfather    Early death Paternal Grandfather    Tuberculosis Paternal Grandfather    Colon cancer Neg Hx     SOCIAL HISTORY: Social History   Socioeconomic History   Marital status: Married    Spouse name: Not on file   Number of children: 4   Years of education: Not on file   Highest education level: Bachelor's degree (e.g., BA, AB, BS)  Occupational History   Occupation: retired, Medical illustrator, Designer, fashion/clothing  Tobacco Use   Smoking status: Never   Smokeless tobacco: Never  Substance and Sexual Activity   Alcohol use: Yes    Comment: socially   Drug use: Never   Sexual activity: Not on file  Other Topics Concern   Not on file  Social History Narrative   Household- lives w/ wife   Social Determinants of Health   Financial Resource Strain: Low Risk  (04/17/2023)   Overall Financial Resource Strain (CARDIA)    Difficulty of Paying Living Expenses: Not hard at all  Food Insecurity: No Food Insecurity (04/17/2023)   Hunger Vital Sign    Worried About Running Out of Food in the Last Year: Never true    Ran Out of Food in the Last Year: Never true  Transportation Needs: No Transportation Needs (04/17/2023)   PRAPARE -  Administrator, Civil Service (Medical): No    Lack of Transportation (Non-Medical): No  Physical Activity: Insufficiently Active (04/17/2023)   Exercise Vital Sign    Days of Exercise per Week: 1 day    Minutes of Exercise per Session: 30 min  Stress: Stress Concern Present (04/17/2023)   [employer] of Occupational Health - Occupational Stress Questionnaire    Feeling of Stress : To some extent  Social Connections: Moderately Isolated (04/17/2023)   Social Connection and Isolation Panel [NHANES]    Frequency of Communication with Friends and Family: Three times a week    Frequency of Social Gatherings with Friends and Family: Three times a week    Attends Religious Services: Never    Active Member of Clubs or Organizations: No    Attends Banker Meetings: Never    Marital Status: Married    MEDICATIONS:  Current Outpatient Medications  Medication Sig Dispense Refill   apixaban (ELIQUIS) 5 MG TABS tablet Take 1 tablet (5 mg total) by mouth 2 (two) times daily. 180 tablet 1   ferrous sulfate 325 (65 FE) MG tablet 325 mg daily.     FLUoxetine (PROZAC) 20 MG tablet Take 60 mg by mouth daily.     Insulin Syringe-Needle U-100 (BD VEO INSULIN SYRINGE U/F) 31G X 15/64" 0.3 ML MISC in the morning, at noon, in the evening, and at bedtime.     LORazepam (ATIVAN) 1 MG tablet TAKE 1 TABLET (1 MG TOTAL) BY MOUTH 2 (TWO) TIMES DAILY AS NEEDED FOR ANXIETY OR SLEEP. 60 tablet 1   Metoprolol Tartrate (FIRST - METOPROLOL) 10 MG/ML SOLN Take 1.25 mLs by mouth in the morning and at bedtime.     Multiple Vitamins-Minerals (PRESERVISION AREDS PO) Take 1 tablet by mouth daily. Preservision AREDS     rosuvastatin (CRESTOR) 40 MG tablet Take 40 mg by mouth daily.     traMADol (ULTRAM) 50 MG tablet Take 1 tablet (50 mg total) by mouth at bedtime as needed for moderate pain. 60 tablet 0   vitamin B-12 (CYANOCOBALAMIN) 1000 MCG tablet Take 1,000 mcg by mouth daily.     HUMULIN 70/30  (70-30) 100 UNIT/ML injection 16 units in the morning with breakfast and 12 units in the evening with supper. 10 mL 4   No current facility-administered medications for this visit.    PHYSICAL EXAM: Vitals:   05/03/23 1522  BP: 120/60  Pulse: 79  SpO2: 97%  Weight: 235 lb 12.8 oz (107 kg)  Height: 6\' 1"  (1.854 m)   Body mass index is 31.11 kg/m.  Wt Readings from Last 3 Encounters:  05/03/23 235 lb 12.8 oz (107 kg)  05/02/23 236 lb (107 kg)  04/25/23 236 lb (107 kg)    General: Well developed, well nourished male in no apparent distress.  HEENT: AT/[place], no external lesions.  Eyes: Conjunctiva clear and no icterus. Neck: Neck supple  Lungs: Respirations not labored Neurologic: Alert, oriented, normal speech Extremities / Skin: Dry. No sores or rashes noted. No acanthosis nigricans Psychiatric: Does not appear depressed or anxious  Diabetic Foot Exam - Simple   No data filed    LABS Reviewed Lab Results  Component Value Date   HGBA1C 7.3 (H) 02/08/2023   HGBA1C 7.3 04/06/2022   HGBA1C 6.9 11/04/2021   No results found for: "FRUCTOSAMINE" Lab Results  Component Value Date   CHOL 118 01/05/2023   HDL 42 01/05/2023   LDLCALC 44 01/05/2023   TRIG 197 (H) 01/05/2023   CHOLHDL 2.8 01/05/2023   Lab Results  Component Value Date   MICRALBCREAT 12.2 04/07/2023   Lab Results  Component Value Date   CREATININE 1.30 (H) 04/25/2023   No results found for: "GFR"  ASSESSMENT / PLAN  1. Controlled type  2 diabetes mellitus with diabetic polyneuropathy, unspecified whether long term insulin use (HCC)   2. Diabetic retinopathy associated with type 2 diabetes mellitus, macular edema presence unspecified, unspecified laterality, unspecified retinopathy severity (HCC)     Diabetes Mellitus type 2, complicated by diabetic retinopathy and neuropathy. - Diabetic status / severity: Fair control.  Lab Results  Component Value Date   HGBA1C 7.3 (H) 02/08/2023    -  Hemoglobin A1c goal : <7%  CGM data reviewed mostly blood sugar trending down overnight and mild hyperglycemia during the daytime.  Discussed about avoiding high carbohydrate/sugary snack and meals.  - Medications: Adjusted as follows.  I) Humulin 70/30 : Changed to 16 units in the morning with breakfast and 12 units in the evening with supper.  He is currently taking 14 units with meals 2 times a day. 2) will consider changing to basal bolus regimen if he continues to have variable blood sugar in the follow-up visits. 3) we will likely avoid GLP-1 receptor agonist, history of gastric/esophageal surgery and upper GI symptoms..  At this time we will also avoid SGLT2 inhibitor as he has been going for urology evaluation for hematuria.  - Home glucose testing: CGM/freestyle libre and check as needed. - Discussed/ Gave Hypoglycemia treatment plan.  # Consult : not required at this time.   # Annual urine for microalbuminuria/ creatinine ratio, no microalbuminuria currently.  Last  Lab Results  Component Value Date   MICRALBCREAT 12.2 04/07/2023    # Foot check nightly / neuropathy.  # He has diabetic retinopathy, following with ophthalmology every 6 months.  - Diet: Eat reasonable portion sizes to promote a healthy weight - Life style / activity / exercise: Discussed.  2. Blood pressure  -  BP Readings from Last 1 Encounters:  05/03/23 120/60    - Control is in target.  - No change in current plans.  3. Lipid status / Hyperlipidemia - Last  Lab Results  Component Value Date   LDLCALC 44 01/05/2023   - Continue rosuvastatin 40 mg daily.  # Elevated alkaline phosphatase -He has elevated liver enzymes AST, AST and elevated GGT, he also drinks alcohol/wine almost daily.  In this context alkaline phosphatase is most likely elevated due to liver disease.  It is less likely to be related with bone disorder. -Patient had seen gastroenterology recently and is being evaluated and  patient reports there is plan for EGD in the near future. -After gastroenterology workup and treatment if he continued to have elevated alkaline phosphatase, further testing including bone turnover marker and bone specific alkaline phosphatase testing can be considered. -Patient is asked to update me regarding gastroenterology evaluation and the plan. -Discussed about alcohol cessation, he is cutting down on alcohol.   Diagnoses and all orders for this visit:  Controlled type 2 diabetes mellitus with diabetic polyneuropathy, unspecified whether long term insulin use (HCC)  Diabetic retinopathy associated with type 2 diabetes mellitus, macular edema presence unspecified, unspecified laterality, unspecified retinopathy severity (HCC)  Other orders -     HUMULIN 70/30 (70-30) 100 UNIT/ML injection; 16 units in the morning with breakfast and 12 units in the evening with supper.    DISPOSITION Follow up in clinic in 3  months suggested.   All questions answered and patient verbalized understanding of the plan.  [place] Melinda Gwinner, MD [facility] [address] [place], [address] [place], [place] [address] Phone # [phone]  At least part of this note was generated using  voice recognition software. Inadvertent word errors may have occurred, which were not recognized during the proofreading process.

## 2023-05-03 NOTE — Telephone Encounter (Signed)
   Name: Lawrence [name].  DOB: [birth year]  MRN: [id number]   Primary Cardiologist: Olga Millers, MD  Chart reviewed as part of pre-operative protocol coverage. Lawrence [name]. was last seen on 03/29/2023 by Dr. Jens Som. He was stable from a cardiac standpoint. Per Dr. Jens Som "okay for endoscopy."  Therefore, based on ACC/AHA guidelines, the patient would be at acceptable risk for the planned procedure without further cardiovascular testing.   Per Pharm D, patient may hold Eliquis for 2 days prior to procedure.    Patient's pacemaker is managed by [facility].   I will route this recommendation to the requesting party via Epic fax function and remove from pre-op pool. Please call with questions.  Carlos Levering, NP 05/03/2023, 7:43 AM

## 2023-05-03 NOTE — Patient Instructions (Signed)
Humulin 70/30: change to 16 units in the morning with breakfast and 12 units with supper.

## 2023-05-04 ENCOUNTER — Encounter: Payer: Self-pay | Admitting: Endocrinology

## 2023-05-09 ENCOUNTER — Telehealth: Payer: Self-pay | Admitting: Internal Medicine

## 2023-05-09 NOTE — Telephone Encounter (Signed)
PDMP okay, Rx sent 

## 2023-05-09 NOTE — Telephone Encounter (Signed)
Requesting: tramadol 50mg   Contract: 03/31/23 UDS: None Last Visit: 04/07/23 Next Visit: 08/21/23 Last Refill: 03/07/23 #60 and 0RF   Please Advise

## 2023-05-10 LAB — URINALYSIS, ROUTINE W REFLEX MICROSCOPIC
Bilirubin, UA: NEGATIVE
Glucose, UA: NEGATIVE
Ketones, UA: NEGATIVE
Leukocytes,UA: NEGATIVE
Nitrite, UA: NEGATIVE
RBC, UA: NEGATIVE
Specific Gravity, UA: >1.03 — ABNORMAL HIGH (ref 1.005–1.030)
Urobilinogen, Ur: 4 mg/dL — ABNORMAL HIGH (ref 0.2–1.0)
pH, UA: 6.5 (ref 5.0–7.5)

## 2023-05-10 LAB — MICROSCOPIC EXAMINATION

## 2023-05-11 ENCOUNTER — Other Ambulatory Visit: Payer: Self-pay | Admitting: Internal Medicine

## 2023-05-16 ENCOUNTER — Ambulatory Visit (HOSPITAL_BASED_OUTPATIENT_CLINIC_OR_DEPARTMENT_OTHER)
Admission: RE | Admit: 2023-05-16 | Discharge: 2023-05-16 | Disposition: A | Discharge status: Home / Self Care | Payer: Medicare PPO | Source: Ambulatory Visit | Attending: Urology | Admitting: Urology

## 2023-05-16 DIAGNOSIS — R310 Gross hematuria: Secondary | ICD-10-CM | POA: Diagnosis present

## 2023-05-16 MED ORDER — IOHEXOL 300 MG/ML  SOLN
125.0000 mL | Freq: Once | INTRAMUSCULAR | Status: AC | PRN
  Administered 2023-05-16: 125 mL via INTRAVENOUS

## 2023-05-18 ENCOUNTER — Ambulatory Visit: Payer: Medicare PPO | Admitting: Endocrinology

## 2023-05-19 ENCOUNTER — Telehealth: Payer: Self-pay

## 2023-05-19 NOTE — Telephone Encounter (Signed)
Plan of care signed and faxed back to [facility] at [phone]. Form sent for scanning.

## 2023-05-22 ENCOUNTER — Telehealth: Payer: Self-pay | Admitting: Urology

## 2023-05-22 ENCOUNTER — Ambulatory Visit (HOSPITAL_BASED_OUTPATIENT_CLINIC_OR_DEPARTMENT_OTHER): Payer: Medicare PPO

## 2023-05-22 NOTE — Telephone Encounter (Signed)
Read about the Cysto procedure he is having this Wednesday online and it mentioned stopping blood thinners. Patient wants to know if he needs to stop his blood thinner prior to this appt.

## 2023-05-22 NOTE — Telephone Encounter (Signed)
Patient notified and verbalized understanding. 

## 2023-05-24 ENCOUNTER — Ambulatory Visit: Payer: Medicare PPO | Admitting: Urology

## 2023-05-24 VITALS — BP 111/74 | HR 77 | Ht 73.0 in | Wt 236.0 lb

## 2023-05-24 DIAGNOSIS — R310 Gross hematuria: Secondary | ICD-10-CM

## 2023-05-24 LAB — MICROSCOPIC EXAMINATION

## 2023-05-24 LAB — URINALYSIS, ROUTINE W REFLEX MICROSCOPIC
Bilirubin, UA: NEGATIVE
Ketones, UA: NEGATIVE
Leukocytes,UA: NEGATIVE
Nitrite, UA: NEGATIVE
Specific Gravity, UA: >1.03 — ABNORMAL HIGH (ref 1.005–1.030)
Urobilinogen, Ur: 2 mg/dL — ABNORMAL HIGH (ref 0.2–1.0)
pH, UA: 6 (ref 5.0–7.5)

## 2023-05-24 MED ORDER — CIPROFLOXACIN HCL 500 MG PO TABS
500.0000 mg | ORAL_TABLET | Freq: Once | ORAL | Status: AC
Start: 2023-05-24 — End: 2023-05-24
  Administered 2023-05-24: 500 mg via ORAL

## 2023-05-24 NOTE — Progress Notes (Signed)
Assessment: 1. Gross hematuria     Plan: I personally reviewed the CT study from 05/16/23 with results as noted below. I discussed these findings with the patient today.  I recommended follow-up with Dr. Drue Novel and his gastroenterologist for the abnormality seen involving the pancreas and liver. Urine sent for cytology today. Cipro x 1 following cystoscopy. Return to office in 6-8 weeks.  Chief Complaint:  Chief Complaint  Patient presents with   Cysto    History of Present Illness:  Lawrence [name]. is a 72 y.o. male who is seen for further evaluation of gross hematuria.  He had acute onset of painless gross hematuria on 04/16/2023.  His urine was bright red in color without clots.  The gross hematuria gradually resolved over the next 3 voids.  No further episodes of gross hematuria.  No flank pain or abdominal pain.  No fevers or chills.  No recent imaging studies.  U/A from 04/17/23:  7-10 RBC Urine culture from 04/17/23:  no growth PSA from 04/07/23:  2.88  He does have mild lower urinary tract symptoms including urgency, nocturia x 1, and occasional decreased force of stream. IPSS = 9.  He has a remote history of a kidney stone. No history of tobacco use. He has a family history of prostate cancer and kidney cancer with his father.  CT hematuria study from 05/16/2023 showed 6 mm hypodense lesion in the right kidney likely benign cyst, small hypodense lesion in the lower right kidney, no renal or ureteral calculi, no evidence of obstruction or filling defect.  He did have evidence of a large hiatal hernia containing part of the pancreas and transverse colon as well as cystic lesions versus dilated dorsal pancreatic duct.  He presents today for cystoscopy.  Portions of the above documentation were copied from a prior visit for review purposes only.   Past Medical History:  Past Medical History:  Diagnosis Date   Anxiety 01/20/2023   Bradycardia    Depression    GERD  (gastroesophageal reflux disease)    H/O hiatal hernia    s/p  repair 06-26-2012   History of esophagectomy 08-21-2012  AT [facility]   History of kidney stones 2006   History of malignant neoplasm of esophagus ONCOLOGIST-  DR D'AMICO AT [facility]-- PER LAST NOTE 01/ 2018  NO RECURRENCE   dx 10/ 2013  Stage 2A (T2 N0)  08-21-2012 s/p  esophagectomy w/ gastric pull-through [facility]) post residual recurrent anastomotic stricture (multiple EGD w/ dilations)   History of transient ischemic attack (TIA) 2003   no residual   Hypertension    Long term (current) use of anticoagulants    xarelto   On dofetilide therapy    PAF (paroxysmal atrial fibrillation) (HCC) first dx 2009   primary cardiologist-  dr Verdis Prime /  EP cardiologist -- dr Alden Hipp ([facility])  s/p  afib ablation 08-02-2016   Pancreatic cyst    x2    Peyronie's disease    Presence of permanent cardiac pacemaker implanted 05-01-2014  at [facility]   Dr.Daubert -[facility] follows   Pulmonary nodule, right    upper and middle lobes --  per last CT stable   Type 2 diabetes mellitus treated with insulin (HCC)    Wears glasses     Past Surgical History:  Past Surgical History:  Procedure Laterality Date   ANTERIOR CERVICAL DECOMP/DISCECTOMY FUSION N/A 06/02/2016   Procedure: ANTERIOR CERVICAL DECOMPRESSION FUSION CERVICAL 5-6, CERVICAL 6-7 WITH INSTRUMENTATION AND  ALLOGRAFT;  Surgeon: Estill Bamberg, MD;  Location: [facility] OR;  Service: Orthopedics;  Laterality: N/A;  ANTERIOR CERVICAL DECOMPRESSION FUSION CERVICAL 5-6, CERVICAL 6-7 WITH INSTRUMENTATION AND ALLOGRAFT   BIOPSY  06/26/2012   Procedure: BIOPSY;  Surgeon: Lodema Pilot, DO;  Location: [facility] ORS;  Service: General;;   CARDIAC ELECTROPHYSIOLOGY STUDY AND ABLATION  08-02-2016   [facility]   (dr Alden Hipp)   pulmonary veins isolation and ablation afib   CARDIAC PACEMAKER PLACEMENT  05-01-2014   [facility]   CHOLECYSTECTOMY  1983   COLONOSCOPY WITH PROPOFOL N/A 07/13/2015   Procedure: COLONOSCOPY WITH  PROPOFOL;  Surgeon: Charolett Bumpers, MD;  Location: [facility] ENDOSCOPY;  Service: Endoscopy;  Laterality: N/A;   ESOPHAGECTOMY  08-21-2012   [facility]   w/ gastric pull-through   ESOPHAGOGASTRODUODENOSCOPY (EGD) WITH ESOPHAGEAL DILATION  multiple x32 per pt approx.--- last one 02-09-2015   recurrent anastomotic stricture post esophagectomy   HIATAL HERNIA REPAIR  06/26/2012   Procedure: LAPAROSCOPIC REPAIR OF HIATAL HERNIA;  Surgeon: Lodema Pilot, DO;  Location: [facility] ORS;  Service: General;;   INCISIONAL HERNIA REPAIR  06/26/2012   Procedure: HERNIA REPAIR INCISIONAL;  Surgeon: Lodema Pilot, DO;  Location: [facility] ORS;  Service: General;;   INGUINAL HERNIA REPAIR Bilateral 1982   JEJUNOSTOMY FEEDING TUBE  12/22/2012   Removed 12/ 2016  approx.   KNEE ARTHROSCOPY Left 1990   KNEE ARTHROSCOPY Right 12/2019   LAPAROSCOPIC GASTRIC SLEEVE RESECTION  06/2012   started but abandoned d/t discovery of tumor   LAPAROTOMY  07/12/2021   revision of previous gastric conduit, hiatal hernia repair   MASS EXCISION Left 04/04/2013   Procedure: EXCISION OF SCALP MASS;  Surgeon: Lodema Pilot, DO;  Location: [facility] ORS;  Service: General;  Laterality: Left;   pilomatrixoma   NESBIT PROCEDURE N/A 12/09/2016   Procedure: NESBIT PROCEDURE 16 DOT PLICATION;  Surgeon: Ihor Gully, MD;  Location: [facility];  Service: Urology;  Laterality: N/A;   THORACOTOMY  07/12/2021   revision of previous gastric conduit   TONSILLECTOMY  1959   TRANSTHORACIC ECHOCARDIOGRAM  08-06-2015   [facility]   mild LVH, ef 50%/  mild RAE /  moderate LAE/  trivial AR, MR, PR,  and TR    Allergies:  Allergies  Allergen Reactions   Chlorthalidone Other (See Comments)    urinary urgency   Lisinopril Other (See Comments)    cough   Metformin Hcl Other (See Comments)    fecal urgency    Family History:  Family History  Problem Relation Age of Onset   Hypertension Mother    Heart failure Mother    Prostate cancer Father    Kidney  cancer Father    Emphysema Father    Aortic aneurysm Father    Anorexia nervosa Sister    Depression Sister    Anorexia nervosa Sister    Heart disease Sister    Hypertension Brother    Cancer Maternal Grandmother    Heart attack Maternal Grandfather    Heart disease Maternal Grandfather    Cancer Paternal Grandmother    Alcohol abuse Paternal Grandfather    Early death Paternal Grandfather    Tuberculosis Paternal Grandfather    Colon cancer Neg Hx     Social History:  Social History   Tobacco Use   Smoking status: Never   Smokeless tobacco: Never  Substance Use Topics   Alcohol use: Yes    Comment: socially   Drug use: Never    ROS: Constitutional:  Negative for fever, chills, weight loss CV: Negative for chest pain, previous MI, hypertension Respiratory:  Negative for shortness of breath, wheezing, sleep apnea, frequent cough GI:  Negative for nausea, vomiting, bloody stool, GERD  Physical exam: BP 111/74   Pulse 77   Ht 6\' 1"  (1.854 m)   Wt 236 lb (107 kg)   BMI 31.14 kg/m  GENERAL APPEARANCE:  Well appearing, well developed, well nourished, NAD HEENT:  Atraumatic, normocephalic, oropharynx clear NECK:  Supple without lymphadenopathy or thyromegaly ABDOMEN:  Soft, non-tender, no masses EXTREMITIES:  Moves all extremities well, without clubbing, cyanosis, or edema NEUROLOGIC:  Alert and oriented x 3, normal gait, CN II-XII grossly intact MENTAL STATUS:  appropriate BACK:  Non-tender to palpation, No CVAT SKIN:  Warm, dry, and intact   Results: U/A:  6-10 WBC, 0-2 RBC, hyaline casts  Procedure:  Flexible Cystourethroscopy  Pre-operative Diagnosis: Gross hematuria  Post-operative Diagnosis: Gross hematuria  Anesthesia:  local with lidocaine jelly  Surgical Narrative:  After appropriate informed consent was obtained, the patient was prepped and draped in the usual sterile fashion in the supine position.  The patient was correctly identified and the  proper procedure delineated prior to proceeding.  Sterile lidocaine gel was instilled in the urethra. The flexible cystoscope was introduced without difficulty.  Findings:  Anterior urethra: Normal  Posterior urethra: Lateral lobe hypertrophy  Bladder:  scattered trabeculations; no mucosal lesions seen  Ureteral orifices: normal  Additional findings: None  Saline bladder wash for cytology was performed.    The cystoscope was then removed.  The patient tolerated the procedure well.

## 2023-05-27 ENCOUNTER — Telehealth: Payer: Self-pay | Admitting: Internal Medicine

## 2023-05-27 DIAGNOSIS — R935 Abnormal findings on diagnostic imaging of other abdominal regions, including retroperitoneum: Secondary | ICD-10-CM

## 2023-05-27 NOTE — Telephone Encounter (Signed)
Call patient: Multiple findings on CT abdomen Arrange a GI referral ([facility]).  Dx abnormal CT abdomen.

## 2023-05-27 NOTE — Telephone Encounter (Signed)
-----   Message from Di Kindle sent at 05/24/2023 11:51 AM EDT ----- Please see recent CT results regarding pancreatic and liver abnormalities.

## 2023-05-29 ENCOUNTER — Encounter: Payer: Self-pay | Admitting: Urology

## 2023-05-29 NOTE — Telephone Encounter (Signed)
Pt aware, was informed during OV w/ Dr. Pete Glatter on 05/24/23- urgent referral placed to Dr. Levora Angel at [facility]. Full CT report also faxed to his attn.

## 2023-06-12 ENCOUNTER — Telehealth: Payer: Self-pay | Admitting: Internal Medicine

## 2023-06-12 NOTE — Telephone Encounter (Signed)
Requesting: lorazepam 1mg   Contract: 03/31/23 UDS: None Last Visit: 04/17/23 Next Visit: 08/21/23 Last Refill: 04/10/23 #60 and 1RF   Please Advise

## 2023-06-12 NOTE — Telephone Encounter (Signed)
PDMP okay, Rx sent 

## 2023-06-18 ENCOUNTER — Other Ambulatory Visit: Payer: Self-pay

## 2023-06-18 ENCOUNTER — Emergency Department (HOSPITAL_BASED_OUTPATIENT_CLINIC_OR_DEPARTMENT_OTHER): Payer: Medicare PPO

## 2023-06-18 ENCOUNTER — Emergency Department (HOSPITAL_BASED_OUTPATIENT_CLINIC_OR_DEPARTMENT_OTHER)
Admission: EM | Admit: 2023-06-18 | Discharge: 2023-06-18 | Disposition: A | Discharge status: Home / Self Care | Payer: Medicare PPO | Attending: Emergency Medicine | Admitting: Emergency Medicine

## 2023-06-18 ENCOUNTER — Encounter (HOSPITAL_BASED_OUTPATIENT_CLINIC_OR_DEPARTMENT_OTHER): Payer: Self-pay | Admitting: Emergency Medicine

## 2023-06-18 DIAGNOSIS — Z79899 Other long term (current) drug therapy: Secondary | ICD-10-CM | POA: Diagnosis not present

## 2023-06-18 DIAGNOSIS — R109 Unspecified abdominal pain: Secondary | ICD-10-CM | POA: Insufficient documentation

## 2023-06-18 DIAGNOSIS — Z794 Long term (current) use of insulin: Secondary | ICD-10-CM | POA: Insufficient documentation

## 2023-06-18 DIAGNOSIS — E119 Type 2 diabetes mellitus without complications: Secondary | ICD-10-CM | POA: Insufficient documentation

## 2023-06-18 DIAGNOSIS — S0990XA Unspecified injury of head, initial encounter: Secondary | ICD-10-CM | POA: Diagnosis present

## 2023-06-18 DIAGNOSIS — I10 Essential (primary) hypertension: Secondary | ICD-10-CM | POA: Diagnosis not present

## 2023-06-18 DIAGNOSIS — Z7901 Long term (current) use of anticoagulants: Secondary | ICD-10-CM | POA: Diagnosis not present

## 2023-06-18 DIAGNOSIS — Y92002 Bathroom of unspecified non-institutional (private) residence single-family (private) house as the place of occurrence of the external cause: Secondary | ICD-10-CM | POA: Diagnosis not present

## 2023-06-18 DIAGNOSIS — S299XXA Unspecified injury of thorax, initial encounter: Secondary | ICD-10-CM

## 2023-06-18 DIAGNOSIS — R55 Syncope and collapse: Secondary | ICD-10-CM

## 2023-06-18 DIAGNOSIS — S0083XA Contusion of other part of head, initial encounter: Secondary | ICD-10-CM | POA: Diagnosis not present

## 2023-06-18 DIAGNOSIS — W16211A Fall in (into) filled bathtub causing drowning and submersion, initial encounter: Secondary | ICD-10-CM | POA: Diagnosis not present

## 2023-06-18 DIAGNOSIS — S2241XA Multiple fractures of ribs, right side, initial encounter for closed fracture: Secondary | ICD-10-CM

## 2023-06-18 LAB — COMPREHENSIVE METABOLIC PANEL
ALT: 27 U/L (ref 0–44)
AST: 39 U/L (ref 15–41)
Albumin: 3.3 g/dL — ABNORMAL LOW (ref 3.5–5.0)
Alkaline Phosphatase: 120 U/L (ref 38–126)
Anion gap: 13 (ref 5–15)
BUN: 12 mg/dL (ref 8–23)
CO2: 23 mmol/L (ref 22–32)
Calcium: 8.7 mg/dL — ABNORMAL LOW (ref 8.9–10.3)
Chloride: 98 mmol/L (ref 98–111)
Creatinine, Ser: 0.95 mg/dL (ref 0.61–1.24)
GFR, Estimated: >60 mL/min (ref >60)
Glucose, Bld: 129 mg/dL — ABNORMAL HIGH (ref 70–99)
Potassium: 3.9 mmol/L (ref 3.5–5.1)
Sodium: 134 mmol/L — ABNORMAL LOW (ref 135–145)
Total Bilirubin: 0.5 mg/dL (ref 0.3–1.2)
Total Protein: 6.4 g/dL — ABNORMAL LOW (ref 6.5–8.1)

## 2023-06-18 LAB — CBC WITH DIFFERENTIAL/PLATELET
Abs Immature Granulocytes: 0.05 10*3/uL (ref 0.00–0.07)
Basophils Absolute: 0.1 10*3/uL (ref 0.0–0.1)
Basophils Relative: 1 %
Eosinophils Absolute: 0.2 10*3/uL (ref 0.0–0.5)
Eosinophils Relative: 2 %
HCT: 38.4 % — ABNORMAL LOW (ref 39.0–52.0)
Hemoglobin: 13.1 g/dL (ref 13.0–17.0)
Immature Granulocytes: 1 %
Lymphocytes Relative: 14 %
Lymphs Abs: 1.5 10*3/uL (ref 0.7–4.0)
MCH: 32.3 pg (ref 26.0–34.0)
MCHC: 34.1 g/dL (ref 30.0–36.0)
MCV: 94.6 fL (ref 80.0–100.0)
Monocytes Absolute: 0.8 10*3/uL (ref 0.1–1.0)
Monocytes Relative: 8 %
Neutro Abs: 8.2 10*3/uL — ABNORMAL HIGH (ref 1.7–7.7)
Neutrophils Relative %: 74 %
Platelets: 191 10*3/uL (ref 150–400)
RBC: 4.06 MIL/uL — ABNORMAL LOW (ref 4.22–5.81)
RDW: 12.6 % (ref 11.5–15.5)
WBC: 10.8 10*3/uL — ABNORMAL HIGH (ref 4.0–10.5)
nRBC: 0 % (ref 0.0–0.2)

## 2023-06-18 MED ORDER — ONDANSETRON HCL 4 MG/2ML IJ SOLN
4.0000 mg | Freq: Once | INTRAMUSCULAR | Status: AC
  Administered 2023-06-18: 4 mg via INTRAVENOUS
  Filled 2023-06-18: qty 2

## 2023-06-18 MED ORDER — IOHEXOL 300 MG/ML  SOLN
100.0000 mL | Freq: Once | INTRAMUSCULAR | Status: AC | PRN
  Administered 2023-06-18: 100 mL via INTRAVENOUS

## 2023-06-18 MED ORDER — FENTANYL CITRATE PF 50 MCG/ML IJ SOSY
50.0000 ug | PREFILLED_SYRINGE | Freq: Once | INTRAMUSCULAR | Status: AC
  Administered 2023-06-18: 50 ug via INTRAVENOUS
  Filled 2023-06-18: qty 1

## 2023-06-18 MED ORDER — HYDROCODONE-ACETAMINOPHEN 5-325 MG PO TABS: 1.0000 | ORAL_TABLET | ORAL | 0 refills | Status: DC | PRN

## 2023-06-18 NOTE — ED Provider Notes (Addendum)
Patient care was taken over from Dr. Bernette Mayers.  Patient had a fall in the bathroom.  This sounds like it was related to loss of conscious related to orthostatic hypotension.  He has a history of this in the past.  His labs are reviewed and are nonconcerning.  EKG does not show any arrhythmias or ischemic changes.  CT imaging was obtained which shows evidence of 4 rib fractures.  3 definitive and 1 questionable.  Discussed options with patient.  There is no underlying pneumothorax or pulmonary contusion.  However given no multiple rib fractures, I did discuss admission to the hospital for monitoring and respiratory care.  Patient is wife discussed this and declined admission currently.  She feels comfortable taking the patient home.  His pain currently is well-controlled although he is concerned about his pain control at home.  He has been taking tramadol for years.  He takes 2 a day.  He request something stronger for the short-term.  I will give him a short-term prescription for Vicodin although I did advise him that he needs to contact his prescriber who is his PCP to let him know that he to get this prescription as it does seem that he has a pain contract.  He will contact Dr. Drue Novel tomorrow.  He also was advised not to take alcohol when he is taking this medication and not to take the tramadol and Vicodin together.  He was given incentive spirometer to use.  He was educated on worsening symptoms such as shortness of breath which could indicate pneumothorax or pulmonary contusion.  He is amenable to returning if he has any worsening symptoms.  He and his wife state he lives only about a third of a mile from the emergency department.  He was discharged home in good condition.  Return precautions were given.   Lawrence Bucco, MD 06/18/23 1610    Lawrence Bucco, MD 06/18/23 [phone]

## 2023-06-18 NOTE — ED Triage Notes (Signed)
Pt reports syncopal episode after getting up to go to the bathroom ~2330 last night. Pt states he fell into his bathtub onto his R side. Pt reports being on blood thinner (Eliquis). He reports pain to R ribs, worse with inspiration. He is A&O on arrival but unsteady on his feet.

## 2023-06-18 NOTE — ED Notes (Addendum)
RT Note: Incentive set up by the RT. Patient tolerated well, showed a great effort

## 2023-06-18 NOTE — ED Notes (Signed)
ED Provider at bedside. 

## 2023-06-18 NOTE — ED Provider Notes (Signed)
[place] [facility]  Provider Note  CSN: [id number] Arrival date & time: 06/18/23 7829  History Chief Complaint  Patient presents with   Loss of Consciousness    Lawrence [name]. is a 72 y.o. male with history of afib on Eliquis, PPM, DM, HTN, and orthostatic hypotension reports he got up to go to the bathroom around 2330hrs, after urinating he had a sudden loss of consciousness, falling and hitting his head and R trunk on the edge of the tub. Son came to help in off the floor. He reports he took his usual Tramadol and Ativan before bed but also had a glass of wine he doesn't usually do. He has been having R sided chest wall pain since then worse with deep breath and cough.    Home Medications Prior to Admission medications   Medication Sig Start Date End Date Taking? Authorizing Provider  Alcohol Swabs ([employer] PREP) 70 % PADS USE AS DIRECTED 05/11/23   Wanda Plump, MD  apixaban (ELIQUIS) 5 MG TABS tablet Take 1 tablet (5 mg total) by mouth 2 (two) times daily. 03/08/23   Lewayne Bunting, MD  ferrous sulfate 325 (65 FE) MG tablet 325 mg daily.    [provider]  FLUoxetine (PROZAC) 20 MG tablet Take 60 mg by mouth daily.    [provider]  HUMULIN 70/30 (70-30) 100 UNIT/ML injection 16 units in the morning with breakfast and 12 units in the evening with supper. 05/03/23   Thapa, [place], MD  Insulin Syringe-Needle U-100 (BD VEO INSULIN SYRINGE U/F) 31G X 15/64" 0.3 ML MISC in the morning, at noon, in the evening, and at bedtime.    [provider]  LORazepam (ATIVAN) 1 MG tablet TAKE 1 TABLET (1 MG TOTAL) BY MOUTH 2 (TWO) TIMES DAILY AS NEEDED FOR ANXIETY OR SLEEP. 06/12/23   Wanda Plump, MD  Metoprolol Tartrate (FIRST - METOPROLOL) 10 MG/ML SOLN Take 1.25 mLs by mouth in the morning and at bedtime. 01/25/23   Wanda Plump, MD  Multiple Vitamins-Minerals (PRESERVISION AREDS PO) Take 1 tablet by mouth daily. Preservision AREDS     [provider]  rosuvastatin (CRESTOR) 40 MG tablet Take 40 mg by mouth daily. 01/25/23   Wanda Plump, MD  traMADol (ULTRAM) 50 MG tablet TAKE 1 TABLET (50 MG TOTAL) BY MOUTH AT BEDTIME AS NEEDED FOR MODERATE PAIN. 05/09/23   Wanda Plump, MD  vitamin B-12 (CYANOCOBALAMIN) 1000 MCG tablet Take 1,000 mcg by mouth daily.    [provider]     Allergies    Chlorthalidone, Lisinopril, and Metformin hcl   Review of Systems   Review of Systems Please see HPI for pertinent positives and negatives  Physical Exam BP (!) 144/82   Pulse 71   Temp 97.6 F (36.4 C)   Resp 17   Ht 6\' 1"  (1.854 m)   Wt 106.6 kg   SpO2 99%   BMI 31.00 kg/m   Physical Exam Vitals and nursing note reviewed.  Constitutional:      Appearance: Normal appearance.  HENT:     Head: Normocephalic.     Comments: Hematoma L forehead    Nose: Nose normal.     Mouth/Throat:     Mouth: Mucous membranes are moist.  Eyes:     Extraocular Movements: Extraocular movements intact.     Conjunctiva/sclera: Conjunctivae normal.  Cardiovascular:     Rate and Rhythm: Normal rate.  Pulmonary:     Effort: Pulmonary effort is normal.     Breath sounds: Normal breath sounds.  Chest:     Chest wall: Tenderness (R lower ribs) present.  Abdominal:     General: Abdomen is flat.     Palpations: Abdomen is soft.     Tenderness: There is no abdominal tenderness.  Musculoskeletal:        General: No swelling. Normal range of motion.     Cervical back: Neck supple. No tenderness.  Skin:    General: Skin is warm and dry.  Neurological:     General: No focal deficit present.     Mental Status: He is alert.  Psychiatric:        Mood and Affect: Mood normal.     ED Results / Procedures / Treatments   EKG EKG Interpretation Date/Time:  Sunday June 18 2023 04:54:12 EDT Ventricular Rate:  70 PR Interval:    QRS Duration:  92 QT Interval:  406 QTC Calculation: 439 R Axis:   -28  Text  Interpretation: ATRIAL PACED RHYTHM Borderline left axis deviation Abnormal R-wave progression, early transition Borderline repolarization abnormality No significant change since last tracing Confirmed by Susy Frizzle [phone]) on 06/18/2023 4:55:10 AM  Procedures Procedures  Medications Ordered in the ED Medications  iohexol (OMNIPAQUE) 300 MG/ML solution 100 mL (100 mLs Intravenous Contrast Given 06/18/23 0617)  fentaNYL (SUBLIMAZE) injection 50 mcg (50 mcg Intravenous Given 06/18/23 0621)  ondansetron (ZOFRAN) injection 4 mg (4 mg Intravenous Given 06/18/23 0620)    Initial Impression and Plan  Patient here with syncope, possible orthostatic vs polypharmacy, with head and trunk injuries, on Eliquis. He is awake and alert now. Will check labs and send for CT imaging.   ED Course   Clinical Course as of 06/18/23 0655  Wynelle Link Jun 18, 2023  6045 CBC is unremarkable.  [CS]  0520 CMP is unremarkable.  [CS]  [phone] Patient requesting pain medication and improved after. I have viewed his CT images and do not see any significant traumatic injuries. Radiology read is pending at the change of shift. If negative, the patient is comfortable going home. He has pain medications he can take there.  [CS]    Clinical Course User Index [CS] Pollyann Savoy, MD     MDM Rules/Calculators/A&P Medical Decision Making Problems Addressed: Injury of chest wall, initial encounter: acute illness or injury Injury of head, initial encounter: acute illness or injury Syncope, unspecified syncope type: acute illness or injury  Amount and/or Complexity of Data Reviewed Labs: ordered. Decision-making details documented in ED Course. Radiology: ordered and independent interpretation performed. Decision-making details documented in ED Course. ECG/medicine tests: ordered and independent interpretation performed. Decision-making details documented in ED Course.  Risk Prescription drug management. Parenteral  controlled substances.     Final Clinical Impression(s) / ED Diagnoses Final diagnoses:  Syncope, unspecified syncope type  Injury of head, initial encounter  Injury of chest wall, initial encounter    Rx / DC Orders ED Discharge Orders     None        Pollyann Savoy, MD 06/18/23 [phone]

## 2023-06-19 ENCOUNTER — Encounter: Payer: Self-pay | Admitting: Internal Medicine

## 2023-06-23 ENCOUNTER — Ambulatory Visit (INDEPENDENT_AMBULATORY_CARE_PROVIDER_SITE_OTHER): Payer: Medicare PPO | Admitting: Internal Medicine

## 2023-06-23 ENCOUNTER — Other Ambulatory Visit: Payer: Self-pay | Admitting: Internal Medicine

## 2023-06-23 ENCOUNTER — Encounter: Payer: Self-pay | Admitting: Internal Medicine

## 2023-06-23 VITALS — BP 120/78 | HR 95 | Temp 98.1°F | Resp 18 | Ht 73.0 in | Wt 227.1 lb

## 2023-06-23 DIAGNOSIS — W19XXXD Unspecified fall, subsequent encounter: Secondary | ICD-10-CM

## 2023-06-23 DIAGNOSIS — I951 Orthostatic hypotension: Secondary | ICD-10-CM

## 2023-06-23 DIAGNOSIS — S2241XD Multiple fractures of ribs, right side, subsequent encounter for fracture with routine healing: Secondary | ICD-10-CM

## 2023-06-23 MED ORDER — HYDROCODONE-ACETAMINOPHEN 5-325 MG PO TABS: 1.0000 | ORAL_TABLET | Freq: Three times a day (TID) | ORAL | 0 refills | Status: DC | PRN

## 2023-06-23 NOTE — Telephone Encounter (Signed)
Pt called back- appt scheduled today at 3:40pm.

## 2023-06-23 NOTE — Progress Notes (Unsigned)
Subjective:    Patient ID: Lawrence Forth., male    DOB: [birth year], 72 y.o.   MRN: [id number]  DOS:  06/23/2023 Type of visit - description: Asked patient to come back for pain management after ER visit  Went to the ER 06/18/2023. He woke up in the middle of the night, he quickly went to the bathroom, shortly after he passed out and landed on the edge of the bathtub on his right chest.  Went to the ER, note reviewed, although the diagnosis is syncope, the ER MDs thought LOC was d/t  orthostatic hypotension and the patient agreed with them.  Workup included multiple  CTs >>>> showed 4 rib fractures.  Admission to the hospital was entertained but eventually released home.  The patient sent a message regards to pain management. PDMP reviewed: Got 12 tablets of hydrocodone 06/18/2023 from the ER doctors. Got  his routine tramadol 30 tablets on 06/12/2023.  Reports today that the hydrocodone helped the rib pain well. Overall pain is decreasing.  Denies fever or chills. No cough or difficulty breathing at this point.   Review of Systems See above   Past Medical History:  Diagnosis Date   Anxiety 01/20/2023   Bradycardia    Depression    GERD (gastroesophageal reflux disease)    H/O hiatal hernia    s/p  repair 06-26-2012   History of esophagectomy 08-21-2012  AT [facility]   History of kidney stones 2006   History of malignant neoplasm of esophagus ONCOLOGIST-  DR D'AMICO AT [facility]-- PER LAST NOTE 01/ 2018  NO RECURRENCE   dx 10/ 2013  Stage 2A (T2 N0)  08-21-2012 s/p  esophagectomy w/ gastric pull-through [facility]) post residual recurrent anastomotic stricture (multiple EGD w/ dilations)   History of transient ischemic attack (TIA) 2003   no residual   Hypertension    Long term (current) use of anticoagulants    xarelto   On dofetilide therapy    PAF (paroxysmal atrial fibrillation) (HCC) first dx 2009   primary cardiologist-  dr Verdis Prime /  EP cardiologist -- dr Alden Hipp  ([facility])  s/p  afib ablation 08-02-2016   Pancreatic cyst    x2    Peyronie's disease    Presence of permanent cardiac pacemaker implanted 05-01-2014  at [facility]   Dr.Daubert -[facility] follows   Pulmonary nodule, right    upper and middle lobes --  per last CT stable   Type 2 diabetes mellitus treated with insulin (HCC)    Wears glasses     Past Surgical History:  Procedure Laterality Date   ANTERIOR CERVICAL DECOMP/DISCECTOMY FUSION N/A 06/02/2016   Procedure: ANTERIOR CERVICAL DECOMPRESSION FUSION CERVICAL 5-6, CERVICAL 6-7 WITH INSTRUMENTATION AND ALLOGRAFT;  Surgeon: Estill Bamberg, MD;  Location: [facility];  Service: Orthopedics;  Laterality: N/A;  ANTERIOR CERVICAL DECOMPRESSION FUSION CERVICAL 5-6, CERVICAL 6-7 WITH INSTRUMENTATION AND ALLOGRAFT   BIOPSY  06/26/2012   Procedure: BIOPSY;  Surgeon: Lodema Pilot, DO;  [facility];  Service: General;;   CARDIAC ELECTROPHYSIOLOGY STUDY AND ABLATION  08-02-2016   [facility]   (dr Alden Hipp)   pulmonary veins isolation and ablation afib   CARDIAC PACEMAKER PLACEMENT  05-01-2014   [facility]   CHOLECYSTECTOMY  1983   COLONOSCOPY WITH PROPOFOL N/A 07/13/2015   Procedure: COLONOSCOPY WITH PROPOFOL;  Surgeon: Charolett Bumpers, MD;  [facility];  Service: Endoscopy;  Laterality: N/A;   ESOPHAGECTOMY  08-21-2012   [facility]   w/ gastric pull-through  ESOPHAGOGASTRODUODENOSCOPY (EGD) WITH ESOPHAGEAL DILATION  multiple x32 per pt approx.--- last one 02-09-2015   recurrent anastomotic stricture post esophagectomy   HIATAL HERNIA REPAIR  06/26/2012   Procedure: LAPAROSCOPIC REPAIR OF HIATAL HERNIA;  Surgeon: Lodema Pilot, DO;  [facility];  Service: General;;   INCISIONAL HERNIA REPAIR  06/26/2012   Procedure: HERNIA REPAIR INCISIONAL;  Surgeon: Lodema Pilot, DO;  [facility];  Service: General;;   INGUINAL HERNIA REPAIR Bilateral 1982   JEJUNOSTOMY FEEDING TUBE  12/22/2012   Removed 12/ 2016  approx.   KNEE ARTHROSCOPY Left 1990    KNEE ARTHROSCOPY Right 12/2019   LAPAROSCOPIC GASTRIC SLEEVE RESECTION  06/2012   started but abandoned d/t discovery of tumor   LAPAROTOMY  07/12/2021   revision of previous gastric conduit, hiatal hernia repair   MASS EXCISION Left 04/04/2013   Procedure: EXCISION OF SCALP MASS;  Surgeon: Lodema Pilot, DO;  [facility];  Service: General;  Laterality: Left;   pilomatrixoma   NESBIT PROCEDURE N/A 12/09/2016   Procedure: NESBIT PROCEDURE 16 DOT PLICATION;  Surgeon: Ihor Gully, MD;  Location: [facility];  Service: Urology;  Laterality: N/A;   THORACOTOMY  07/12/2021   revision of previous gastric conduit   TONSILLECTOMY  1959   TRANSTHORACIC ECHOCARDIOGRAM  08-06-2015   [facility]   mild LVH, ef 50%/  mild RAE /  moderate LAE/  trivial AR, MR, PR,  and TR    Current Outpatient Medications  Medication Instructions   Alcohol Swabs ([employer] PREP) 70 % PADS See admin instructions   apixaban (ELIQUIS) 5 mg, Oral, 2 times daily   cyanocobalamin (VITAMIN B12) 1,000 mcg, Oral, Daily   ferrous sulfate 325 mg, Daily   FLUoxetine (PROZAC) 60 mg, Oral, Daily   HUMULIN 70/30 (70-30) 100 UNIT/ML injection 16 units in the morning with breakfast and 12 units in the evening with supper.   HYDROcodone-acetaminophen (NORCO/VICODIN) 5-325 MG tablet 1-2 tablets, Oral, Every 4 hours PRN   Insulin Syringe-Needle U-100 (BD VEO INSULIN SYRINGE U/F) 31G X 15/64" 0.3 ML MISC 4 times daily   LORazepam (ATIVAN) 1 mg, Oral, 2 times daily PRN   Metoprolol Tartrate (FIRST - METOPROLOL) 10 MG/ML SOLN 1.25 mLs, Oral, 2 times daily   Multiple Vitamins-Minerals (PRESERVISION AREDS PO) 1 tablet, Oral, Daily, Preservision AREDS   rosuvastatin (CRESTOR) 40 mg, Oral, Daily   traMADol (ULTRAM) 50 mg, Oral, At bedtime PRN       Objective:   Physical Exam BP 120/78   Pulse 95   Temp 98.1 F (36.7 C) (Oral)   Resp 18   Ht 6\' 1"  (1.854 m)   Wt 227 lb 2 oz (103 kg)   SpO2 98%   BMI 29.97 kg/m  General:    Well developed mild does not look particularly uncomfortable from pain. HEENT:  Normocephalic . Face symmetric, atraumatic Lungs:  CTA B Normal respiratory effort, no intercostal retractions, no accessory muscle use. Chest wall: TTP, moderately saw at the anterior chest wall right side.  No crepitus. Heart: RRR,  no murmur.  Lower extremities: no pretibial edema bilaterally  Skin: Not pale. Not jaundice Neurologic:  alert & oriented X3.  Speech normal, gait appropriate for age and unassisted Psych--  Cognition and judgment appear intact.  Cooperative with normal attention span and concentration.  Behavior appropriate. No anxious or depressed appearing.      Assessment     Assessment (new patient, previous PCP retired) DM- per endo Mild nonproliferative diabetic  retinopathy with macular edema. HTN High cholesterol Anxiety  GI: Esophageal neoplasm 2013 found during a gastric bypass  Hiatal hernia: R thoracotomy Laparotomy lysis of adhesions for repair of paraesophageal hernia in 2022  Cardiovascular: A-fib dx 2014, anticoagulated Pacemaker 2015, Ablation @ [facility] 2017 TIA 2003 ?  MRI negative, MRA some atherosclerosis distal R vertebral artery? Orthostatic hypotension CAD: Moderate-per CT coronary angiogram 01/2023, medical mngmt [facility]: see surgeries, on tramadol   PLAN Rib fractures: Patient had a fall believed to be due to orthostatic hypotension.  Has been taking hydrocodone 1 or 2 every 6 hours, he also takes chronic tramadol for neck pain. Here for pain management. Plan: Continue hydrocodone 1 tablet every 8 hours as needed for the next week. Pdmp ok, rx sent  While on hydrocodone do not take tramadol. Call if not gradually better Fall, orthostatic hypotension, fall prevention:  Long discussion about the issue.  He used to drink wine, 1 bottle at night, in the last few months has "cut down" to 2 glasses at night, since the fall  has stopped completely.  Praised for  his decision. Also is making a effort  not to get up too quickly. I recommended to minimize the use of Ativan, consider using a cane and have a night light. More information about fall prevention provided.

## 2023-06-23 NOTE — Patient Instructions (Addendum)
For rib pain: Hydrocodone 1 tablet every 8 hours as needed for the next week.  While you take hydrocodone, do not take tramadol.  Fall prevention: No alcohol Try to minimize the use of Ativan at nighttime Consider using a cane. Have a night  light Do not get out of bed too quickly  Your next appointment with me is December 16.  Please come back sooner if needed.  Please read information about fall prevention below   Fall Prevention in the Home, Adult Falls can cause injuries and affect people of all ages. There are many simple things that you can do to make your home safe and to help prevent falls. If you need it, ask for help making these changes. What actions can I take to prevent falls? General information Use good lighting in all rooms. Make sure to: Replace any light bulbs that burn out. Turn on lights if it is dark and use night-lights. Keep items that you use often in easy-to-reach places. Lower the shelves around your home if needed. Move furniture so that there are clear paths around it. Do not keep throw rugs or other things on the floor that can make you trip. If any of your floors are uneven, fix them. Add color or contrast paint or tape to clearly mark and help you see: Grab bars or handrails. First and last steps of staircases. Where the edge of each step is. If you use a ladder or stepladder: Make sure that it is fully opened. Do not climb a closed ladder. Make sure the sides of the ladder are locked in place. Have someone hold the ladder while you use it. Know where your pets are as you move through your home. What can I do in the bathroom?     Keep the floor dry. Clean up any water that is on the floor right away. Remove soap buildup in the bathtub or shower. Buildup makes bathtubs and showers slippery. Use non-skid mats or decals on the floor of the bathtub or shower. Attach bath mats securely with double-sided, non-slip rug tape. If you need to sit  down while you are in the shower, use a non-slip stool. Install grab bars by the toilet and in the bathtub and shower. Do not use towel bars as grab bars. What can I do in the bedroom? Make sure that you have a light by your bed that is easy to reach. Do not use any sheets or blankets on your bed that hang to the floor. Have a firm bench or chair with side arms that you can use for support when you get dressed. What can I do in the kitchen? Clean up any spills right away. If you need to reach something above you, use a sturdy step stool that has a grab bar. Keep electrical cables out of the way. Do not use floor polish or wax that makes floors slippery. What can I do with my stairs? Do not leave anything on the stairs. Make sure that you have a light switch at the top and the bottom of the stairs. Have them installed if you do not have them. Make sure that there are handrails on both sides of the stairs. Fix handrails that are broken or loose. Make sure that handrails are as long as the staircases. Install non-slip stair treads on all stairs in your home if they do not have carpet. Avoid having throw rugs at the top or bottom of stairs, or secure the rugs  with carpet tape to prevent them from moving. Choose a carpet design that does not hide the edge of steps on the stairs. Make sure that carpet is firmly attached to the stairs. Fix any carpet that is loose or worn. What can I do on the outside of my home? Use bright outdoor lighting. Repair the edges of walkways and driveways and fix any cracks. Clear paths of anything that can make you trip, such as tools or rocks. Add color or contrast paint or tape to clearly mark and help you see high doorway thresholds. Trim any bushes or trees on the main path into your home. Check that handrails are securely fastened and in good repair. Both sides of all steps should have handrails. Install guardrails along the edges of any raised decks or  porches. Have leaves, snow, and ice cleared regularly. Use sand, salt, or ice melt on walkways during winter months if you live where there is ice and snow. In the garage, clean up any spills right away, including grease or oil spills. What other actions can I take? Review your medicines with your health care provider. Some medicines can make you confused or feel dizzy. This can increase your chance of falling. Wear closed-toe shoes that fit well and support your feet. Wear shoes that have rubber soles and low heels. Use a cane, walker, scooter, or crutches that help you move around if needed. Talk with your provider about other ways that you can decrease your risk of falls. This may include seeing a physical therapist to learn to do exercises to improve movement and strength. Where to find more information Centers for Disease Control and Prevention, STEADI: TonerPromos.no [employer] on Aging: BaseRingTones.pl National Institute on Aging: BaseRingTones.pl Contact a health care provider if: You are afraid of falling at home. You feel weak, drowsy, or dizzy at home. You fall at home. Get help right away if you: Lose consciousness or have trouble moving after a fall. Have a fall that causes a head injury. These symptoms may be an emergency. Get help right away. Call 911. Do not wait to see if the symptoms will go away. Do not drive yourself to the hospital. This information is not intended to replace advice given to you by your health care provider. Make sure you discuss any questions you have with your health care provider. Document Revised: 04/25/2022 Document Reviewed: 04/25/2022 Elsevier Patient Education  2024 [employer].

## 2023-06-23 NOTE — Telephone Encounter (Signed)
LMOM informing Pt that PCP received message, he would like to schedule ED f/u- preferably virtually. PCP has an appt available today at 3:40pm, asked that he call or send message confirming if time is okay.

## 2023-06-24 NOTE — Assessment & Plan Note (Signed)
Rib fractures: Patient had a fall believed to be due to orthostatic hypotension.  Has been taking hydrocodone 1 or 2 every 6 hours, he also takes chronic tramadol for neck pain. Here for pain management. Plan: Continue hydrocodone 1 tablet every 8 hours as needed for the next week. Pdmp ok, rx sent  While on hydrocodone do not take tramadol. Call if not gradually better Fall, orthostatic hypotension, fall prevention:  Long discussion about the issue.  He used to drink wine, 1 bottle at night, in the last few months has "cut down" to 2 glasses at night, since the fall  has stopped completely.  Praised for his decision. Also is making a effort  not to get up too quickly. I recommended to minimize the use of Ativan, consider using a cane and have a night light. More information about fall prevention provided.

## 2023-06-26 ENCOUNTER — Ambulatory Visit: Payer: Medicare PPO | Admitting: Internal Medicine

## 2023-07-05 ENCOUNTER — Other Ambulatory Visit: Payer: Self-pay | Admitting: Internal Medicine

## 2023-07-05 ENCOUNTER — Encounter: Payer: Self-pay | Admitting: Internal Medicine

## 2023-07-05 NOTE — Telephone Encounter (Signed)
Requesting: hydrocodone 5-325mg  Contract: N/A UDS: N/A Last Visit: 06/23/23 Next Visit: 08/21/23 Last Refill: 06/23/23 #21 and 0RF   Please Advise

## 2023-07-06 MED ORDER — TRAMADOL HCL 50 MG PO TABS: 50.0000 mg | ORAL_TABLET | Freq: Two times a day (BID) | ORAL | 0 refills | Status: DC | PRN

## 2023-07-06 NOTE — Addendum Note (Signed)
Addended by: Willow Ora E on: 07/06/2023 11:43 AM   Modules accepted: Orders

## 2023-07-06 NOTE — Telephone Encounter (Signed)
See pt's message, will wait for his response

## 2023-07-21 ENCOUNTER — Other Ambulatory Visit: Payer: Self-pay | Admitting: Internal Medicine

## 2023-07-21 ENCOUNTER — Other Ambulatory Visit: Payer: Self-pay

## 2023-07-21 MED ORDER — "BD VEO INSULIN SYRINGE U/F 31G X 15/64"" 0.3 ML MISC"
3 refills | Status: DC
  Filled 2023-07-21: qty 300, fill #0

## 2023-07-24 ENCOUNTER — Other Ambulatory Visit: Payer: Self-pay

## 2023-07-24 ENCOUNTER — Other Ambulatory Visit (HOSPITAL_BASED_OUTPATIENT_CLINIC_OR_DEPARTMENT_OTHER): Payer: Self-pay

## 2023-07-24 MED ORDER — BD VEO INSULIN SYRINGE U/F 31G X 15/64" 0.3 ML MISC
3 refills | Status: AC
Start: 2023-07-24 — End: ?
  Filled 2023-07-24: qty 300, 75d supply, fill #0
  Filled 2023-10-30: qty 300, 75d supply, fill #1
  Filled 2024-02-23 (×2): qty 300, 75d supply, fill #2
  Filled 2024-06-25 – 2024-07-19 (×4): qty 300, 75d supply, fill #3

## 2023-07-25 ENCOUNTER — Ambulatory Visit: Payer: Medicare PPO | Admitting: Urology

## 2023-07-25 ENCOUNTER — Other Ambulatory Visit (HOSPITAL_BASED_OUTPATIENT_CLINIC_OR_DEPARTMENT_OTHER): Payer: Self-pay

## 2023-07-25 DIAGNOSIS — C159 Malignant neoplasm of esophagus, unspecified: Secondary | ICD-10-CM | POA: Diagnosis not present

## 2023-07-26 ENCOUNTER — Ambulatory Visit: Payer: Medicare PPO | Admitting: Urology

## 2023-07-26 ENCOUNTER — Encounter: Payer: Self-pay | Admitting: Urology

## 2023-07-26 VITALS — BP 115/79 | HR 83 | Ht 73.0 in | Wt 228.0 lb

## 2023-07-26 DIAGNOSIS — R310 Gross hematuria: Secondary | ICD-10-CM

## 2023-07-26 DIAGNOSIS — Z87898 Personal history of other specified conditions: Secondary | ICD-10-CM

## 2023-07-26 DIAGNOSIS — Z09 Encounter for follow-up examination after completed treatment for conditions other than malignant neoplasm: Secondary | ICD-10-CM | POA: Diagnosis not present

## 2023-07-26 LAB — MICROSCOPIC EXAMINATION

## 2023-07-26 LAB — URINALYSIS, ROUTINE W REFLEX MICROSCOPIC
Bilirubin, UA: NEGATIVE
Glucose, UA: NEGATIVE
Ketones, UA: NEGATIVE
Leukocytes,UA: NEGATIVE
Nitrite, UA: NEGATIVE
RBC, UA: NEGATIVE
Specific Gravity, UA: >1.03 — ABNORMAL HIGH (ref 1.005–1.030)
Urobilinogen, Ur: 2 mg/dL — ABNORMAL HIGH (ref 0.2–1.0)
pH, UA: 6 (ref 5.0–7.5)

## 2023-07-26 NOTE — Progress Notes (Signed)
Assessment: 1. Gross hematuria; negative evaluation 9/24     Plan: His gross hematuria has resolved.  Patient reassured that his evaluation did not show any serious or life-threatening cause for the gross hematuria. Return to office in 6 months.  Chief Complaint:  Chief Complaint  Patient presents with   Hematuria    History of Present Illness:  Lawrence Boker. is a 72 y.o. male who is seen for further evaluation of gross hematuria.  He had acute onset of painless gross hematuria on 04/16/2023.  His urine was bright red in color without clots.  The gross hematuria gradually resolved over the next 3 voids.  No further episodes of gross hematuria.  No flank pain or abdominal pain.  No fevers or chills.  No recent imaging studies.  U/A from 04/17/23:  7-10 RBC Urine culture from 04/17/23:  no growth PSA from 04/07/23:  2.88  He does have mild lower urinary tract symptoms including urgency, nocturia x 1, and occasional decreased force of stream. IPSS = 9.  He has a remote history of a kidney stone. No history of tobacco use. He has a family history of prostate cancer and kidney cancer with his father.  CT hematuria study from 05/16/2023 showed 6 mm hypodense lesion in the right kidney likely benign cyst, small hypodense lesion in the lower right kidney, no renal or ureteral calculi, no evidence of obstruction or filling defect.  He did have evidence of a large hiatal hernia containing part of the pancreas and transverse colon as well as cystic lesions versus dilated dorsal pancreatic duct. Cystoscopy from 05/24/23 showed bladder trabeculations and cellules but no other mucosal abnormalities. Urine cytology was negative for malignancy.  He returns today for scheduled follow-up.  He has had no further episodes of gross hematuria since his last visit.  No dysuria or flank pain.  He continues with baseline urinary symptoms of nocturia, urgency, and occasional decreased stream.  He is not  bothered by the symptoms. IPSS = 14.   Portions of the above documentation were copied from a prior visit for review purposes only.   Past Medical History:  Past Medical History:  Diagnosis Date   Anxiety 01/20/2023   Bradycardia    Depression    GERD (gastroesophageal reflux disease)    H/O hiatal hernia    s/p  repair 06-26-2012   History of esophagectomy 08-21-2012  AT [facility]   History of kidney stones 2006   History of malignant neoplasm of esophagus ONCOLOGIST-  DR D'AMICO AT [facility]-- PER LAST NOTE 01/ 2018  NO RECURRENCE   dx 10/ 2013  Stage 2A (T2 N0)  08-21-2012 s/p  esophagectomy w/ gastric pull-through [facility]) post residual recurrent anastomotic stricture (multiple EGD w/ dilations)   History of transient ischemic attack (TIA) 2003   no residual   Hypertension    Long term (current) use of anticoagulants    xarelto   On dofetilide therapy    PAF (paroxysmal atrial fibrillation) (HCC) first dx 2009   primary cardiologist-  dr Verdis Prime /  EP cardiologist -- dr Alden Hipp ([facility])  s/p  afib ablation 08-02-2016   Pancreatic cyst    x2    Peyronie's disease    Presence of permanent cardiac pacemaker implanted 05-01-2014  at [facility]   Dr.Daubert -[facility] follows   Pulmonary nodule, right    upper and middle lobes --  per last CT stable   Type 2 diabetes mellitus treated with insulin (HCC)  Wears glasses     Past Surgical History:  Past Surgical History:  Procedure Laterality Date   ANTERIOR CERVICAL DECOMP/DISCECTOMY FUSION N/A 06/02/2016   Procedure: ANTERIOR CERVICAL DECOMPRESSION FUSION CERVICAL 5-6, CERVICAL 6-7 WITH INSTRUMENTATION AND ALLOGRAFT;  Surgeon: Estill Bamberg, MD;  Location: [facility];  Service: Orthopedics;  Laterality: N/A;  ANTERIOR CERVICAL DECOMPRESSION FUSION CERVICAL 5-6, CERVICAL 6-7 WITH INSTRUMENTATION AND ALLOGRAFT   BIOPSY  06/26/2012   Procedure: BIOPSY;  Surgeon: Lodema Pilot, DO;  [facility];  Service: General;;   CARDIAC  ELECTROPHYSIOLOGY STUDY AND ABLATION  08-02-2016   [facility]   (dr Alden Hipp)   pulmonary veins isolation and ablation afib   CARDIAC PACEMAKER PLACEMENT  05-01-2014   [facility]   CHOLECYSTECTOMY  1983   COLONOSCOPY WITH PROPOFOL N/A 07/13/2015   Procedure: COLONOSCOPY WITH PROPOFOL;  Surgeon: Charolett Bumpers, MD;  [facility];  Service: Endoscopy;  Laterality: N/A;   ESOPHAGECTOMY  08-21-2012   [facility]   w/ gastric pull-through   ESOPHAGOGASTRODUODENOSCOPY (EGD) WITH ESOPHAGEAL DILATION  multiple x32 per pt approx.--- last one 02-09-2015   recurrent anastomotic stricture post esophagectomy   HIATAL HERNIA REPAIR  06/26/2012   Procedure: LAPAROSCOPIC REPAIR OF HIATAL HERNIA;  Surgeon: Lodema Pilot, DO;  [facility];  Service: General;;   INCISIONAL HERNIA REPAIR  06/26/2012   Procedure: HERNIA REPAIR INCISIONAL;  Surgeon: Lodema Pilot, DO;  [facility];  Service: General;;   INGUINAL HERNIA REPAIR Bilateral 1982   JEJUNOSTOMY FEEDING TUBE  12/22/2012   Removed 12/ 2016  approx.   KNEE ARTHROSCOPY Left 1990   KNEE ARTHROSCOPY Right 12/2019   LAPAROSCOPIC GASTRIC SLEEVE RESECTION  06/2012   started but abandoned d/t discovery of tumor   LAPAROTOMY  07/12/2021   revision of previous gastric conduit, hiatal hernia repair   MASS EXCISION Left 04/04/2013   Procedure: EXCISION OF SCALP MASS;  Surgeon: Lodema Pilot, DO;  [facility];  Service: General;  Laterality: Left;   pilomatrixoma   NESBIT PROCEDURE N/A 12/09/2016   Procedure: NESBIT PROCEDURE 16 DOT PLICATION;  Surgeon: Ihor Gully, MD;  Location: [facility];  Service: Urology;  Laterality: N/A;   THORACOTOMY  07/12/2021   revision of previous gastric conduit   TONSILLECTOMY  1959   TRANSTHORACIC ECHOCARDIOGRAM  08-06-2015   [facility]   mild LVH, ef 50%/  mild RAE /  moderate LAE/  trivial AR, MR, PR,  and TR    Allergies:  Allergies  Allergen Reactions   Chlorthalidone Other (See Comments)    urinary  urgency   Lisinopril Other (See Comments)    cough   Metformin Hcl Other (See Comments)    fecal urgency    Family History:  Family History  Problem Relation Age of Onset   Hypertension Mother    Heart failure Mother    Prostate cancer Father    Kidney cancer Father    Emphysema Father    Aortic aneurysm Father    Anorexia nervosa Sister    Depression Sister    Anorexia nervosa Sister    Heart disease Sister    Hypertension Brother    Cancer Maternal Grandmother    Heart attack Maternal Grandfather    Heart disease Maternal Grandfather    Cancer Paternal Grandmother    Alcohol abuse Paternal Grandfather    Early death Paternal Grandfather    Tuberculosis Paternal Grandfather    Colon cancer Neg Hx     Social History:  Social History  Tobacco Use   Smoking status: Never   Smokeless tobacco: Never  Vaping Use   Vaping status: Never Used  Substance Use Topics   Alcohol use: Yes    Comment: socially   Drug use: Never    ROS: Constitutional:  Negative for fever, chills, weight loss CV: Negative for chest pain, previous MI, hypertension Respiratory:  Negative for shortness of breath, wheezing, sleep apnea, frequent cough GI:  Negative for nausea, vomiting, bloody stool, GERD  Physical exam: BP 115/79   Pulse 83   Ht 6\' 1"  (1.854 m)   Wt 228 lb (103.4 kg)   BMI 30.08 kg/m  GENERAL APPEARANCE:  Well appearing, well developed, well nourished, NAD HEENT:  Atraumatic, normocephalic, oropharynx clear NECK:  Supple without lymphadenopathy or thyromegaly ABDOMEN:  Soft, non-tender, no masses EXTREMITIES:  Moves all extremities well, without clubbing, cyanosis, or edema NEUROLOGIC:  Alert and oriented x 3, normal gait, CN II-XII grossly intact MENTAL STATUS:  appropriate BACK:  Non-tender to palpation, No CVAT SKIN:  Warm, dry, and intact   Results: U/A:  0 RBC, 0 WBC, calcium oxalate cyrstals

## 2023-07-27 ENCOUNTER — Encounter: Payer: Self-pay | Admitting: Internal Medicine

## 2023-07-27 NOTE — Telephone Encounter (Signed)
Requesting: tramadol 50mg   Contract: None UDS: None Last Visit: 06/23/23 Next Visit: 08/21/23 Last Refill: 07/06/23 #60 and 0RF   Please Advise

## 2023-07-28 DIAGNOSIS — E1165 Type 2 diabetes mellitus with hyperglycemia: Secondary | ICD-10-CM | POA: Diagnosis not present

## 2023-07-28 MED ORDER — TRAMADOL HCL 50 MG PO TABS: 50.0000 mg | ORAL_TABLET | Freq: Two times a day (BID) | ORAL | 0 refills | Status: DC | PRN

## 2023-07-28 NOTE — Telephone Encounter (Signed)
PDMP reviewed, taking more than twice daily.

## 2023-08-01 NOTE — Telephone Encounter (Signed)
Spoke w/ [facility]- override given for them to go ahead and fill tramadol 50mg  for Pt.

## 2023-08-01 NOTE — Telephone Encounter (Signed)
PDMP reviewed: 07/06/2023: Got tramadol  # 60 tablets.  30-day supply 06/23/2023: Called hydrocodone 21 tablets.  7-day supply  I spoke with the patient. Ran out of hydrocodone. Ran out of tramadol, due to rib fractures, he was taking the extra tramadol for several days. Plan: Please call the pharmacy, explained them that he had a refracture and needed to take a extra tramadol in the last few weeks. Okay for early RF of  tramadol 60 tablets. We agreed not to take tramadol more frequent BID

## 2023-08-02 ENCOUNTER — Telehealth: Payer: Self-pay | Admitting: Internal Medicine

## 2023-08-02 MED ORDER — FLUOXETINE HCL 20 MG PO TABS: 60.0000 mg | ORAL_TABLET | Freq: Every day | ORAL | 1 refills | Status: DC

## 2023-08-02 NOTE — Addendum Note (Signed)
Addended byConrad Penelope D on: 08/02/2023 09:03 AM   Modules accepted: Orders

## 2023-08-02 NOTE — Telephone Encounter (Signed)
Rx sent 

## 2023-08-02 NOTE — Telephone Encounter (Signed)
Macy from [facility] on [address] called and stated that they faxed over a prescription request for Fluoxetine. However, she stated that the patient mentioned it was filled by a different doctor and stated "his doctor could refill it". Please call and advise pt.

## 2023-08-05 ENCOUNTER — Telehealth: Payer: Self-pay | Admitting: Internal Medicine

## 2023-08-05 NOTE — Telephone Encounter (Signed)
Requesting: lorazepam 1mg  Contract: No UDS: no Last Visit: 06/23/2023 Next Visit: 08/21/2023 Last Refill: 06/12/2023  Please Advise

## 2023-08-07 NOTE — Telephone Encounter (Signed)
PDMP reviewed, prescription sent 

## 2023-08-08 ENCOUNTER — Ambulatory Visit: Payer: Medicare PPO | Admitting: Endocrinology

## 2023-08-08 ENCOUNTER — Encounter: Payer: Self-pay | Admitting: Endocrinology

## 2023-08-08 VITALS — BP 110/70 | HR 87 | Resp 20 | Ht 73.0 in | Wt 228.4 lb

## 2023-08-08 DIAGNOSIS — E1142 Type 2 diabetes mellitus with diabetic polyneuropathy: Secondary | ICD-10-CM

## 2023-08-08 DIAGNOSIS — Z794 Long term (current) use of insulin: Secondary | ICD-10-CM | POA: Diagnosis not present

## 2023-08-08 LAB — POCT GLYCOSYLATED HEMOGLOBIN (HGB A1C): Hemoglobin A1C: 6.8 % — AB (ref 4.0–5.6)

## 2023-08-08 MED ORDER — HUMULIN 70/30 (70-30) 100 UNIT/ML ~~LOC~~ SUSP: SUBCUTANEOUS | 4 refills | Status: DC

## 2023-08-08 NOTE — Patient Instructions (Signed)
Humulin 70/30: change to  20 units in the morning with breakfast and 8 units with supper.

## 2023-08-08 NOTE — Progress Notes (Signed)
Outpatient Endocrinology Note [place] Kyrsten Deleeuw, MD  08/08/23  Patient's Name: Lawrence [name].    DOB: Oct 22, 1950    MRN: [id number]                                                    REASON OF VISIT: Follow-up of type 2 diabetes mellitus   REFERRING PROVIDER: Wanda Plump, MD  PCP: Wanda Plump, MD  HISTORY OF PRESENT ILLNESS:   Adrian [place]. is a 72 y.o. old male with past medical history listed below, is here for evaluation of  type 2 diabetes mellitus.   Pertinent Diabetes History: Patient was diagnosed with type 2 diabetes mellitus in 2013 at that time hemoglobin A1c was 8.6%.  From 2022 he was following with [facility] for the diabetes care, he was hospitalized postoperatively at that time for surgical intervention for post esophagectomy anastomotic leak, he was also doing telemedicine visits as well, he was last time seen in March 2024. He wants to transfer over his diabetes care in this clinic, was initially seen in August 2024.  Patient has history of stage II esophageal adenocarcinoma status post exploratory laparotomy for transhiatal esophagogastrectomy in 2013.  Chronic Diabetes Complications : Retinopathy: yes. Following with ophthalmology / retinal specialist, every 6 months.  Nephropathy: no Peripheral neuropathy: yes, numbness and tingling + Coronary artery disease: no Stroke: no  Relevant comorbidities and cardiovascular risk factors: Obesity: yes Body mass index is 30.13 kg/m.  Hypertension: yes Hyperlipidemia. Yes, on statin   Current / Home Diabetic regimen includes:  Humulin 70/30 insulin 16 units with breakfast and 12 units with supper.  Prior diabetic medications: Metformin, stopped due to diarrhea.   Glycemic data:    CONTINUOUS GLUCOSE MONITORING SYSTEM (CGMS) INTERPRETATION: At today's visit, we reviewed CGM downloads. The full report is scanned in the media. Reviewing the CGM trends, blood glucose are as follows:  FreeStyle Libre 2  CGM-  Sensor Download (Sensor download was reviewed and summarized below.) Dates: November 20 to December 3 , 2024, 14 days Sensor Average: 166 Glucose Management Indicator: 7.3% Glucose Variability: 35.9%  % data captured: 68%  Glycemic Trends:  <54: 0% 54-70: 0% 71-180: 64% 181-250: 26% 251-400: 10%  Interpretation: There has been frequent hyperglycemia with blood sugar up to 250 to 300 range mainly with lunch and occasionally with breakfast and supper.  He has occasional trending down blood sugar with mild hypoglycemia with blood sugar in the 60s in the middle of night.  Hypoglycemia: Patient has minor hypoglycemic episodes. Patient has hypoglycemia awareness.  Factors modifying glucose control: 1.  Diabetic diet assessment: He generally eats a small frequent meals.  He has habits of drinking wine daily and cutting down.  2.  Staying active or exercising:   3.  Medication compliance: compliant all of the time.  # Elevated alkaline phosphatase likely related to liver disorder:  -Patient has elevated alkaline phosphatase since August 2023.  Previously he had normal alkaline phosphatase.. Bone scan showed overall increased tracer uptake throughout the axial and appendicular skeleton raising question of metabolic bone disease.  Alkaline phosphatase 138 to 171 with upper normal limit of 126.  Mildly elevated liver enzyme AST and ALT.  Elevated GGT, 1 36-141 with upper normal limit of 65. -He has normal serum calcium.  Normal vitamin D level.  Normal PSA. -Patient has been drinking alcohol significantly mainly wine almost every day. -Patient has been following with gastroenterology at [facility] gastroenterology.  Interval history  CGM data as reviewed above.  He has occasional numbness and tingling of the feet.  Denies any new vision problem.  No other complaints today.  He has noticed occasional low blood sugar overnight when he eats lighter supper.  REVIEW OF SYSTEMS As per history  of present illness.   PAST MEDICAL HISTORY: Past Medical History:  Diagnosis Date   Anxiety 01/20/2023   Bradycardia    Depression    GERD (gastroesophageal reflux disease)    H/O hiatal hernia    s/p  repair 06-26-2012   History of esophagectomy 08-21-2012  AT [facility]   History of kidney stones 2006   History of malignant neoplasm of esophagus ONCOLOGIST-  DR D'AMICO AT [facility]-- PER LAST NOTE 01/ 2018  NO RECURRENCE   dx 10/ 2013  Stage 2A (T2 N0)  08-21-2012 s/p  esophagectomy w/ gastric pull-through [facility]) post residual recurrent anastomotic stricture (multiple EGD w/ dilations)   History of transient ischemic attack (TIA) 2003   no residual   Hypertension    Long term (current) use of anticoagulants    xarelto   On dofetilide therapy    PAF (paroxysmal atrial fibrillation) (HCC) first dx 2009   primary cardiologist-  dr Verdis Prime /  EP cardiologist -- dr Alden Hipp ([facility])  s/p  afib ablation 08-02-2016   Pancreatic cyst    x2    Peyronie's disease    Presence of permanent cardiac pacemaker implanted 05-01-2014  at [facility]   Dr.Daubert -[facility] follows   Pulmonary nodule, right    upper and middle lobes --  per last CT stable   Type 2 diabetes mellitus treated with insulin (HCC)    Wears glasses     PAST SURGICAL HISTORY: Past Surgical History:  Procedure Laterality Date   ANTERIOR CERVICAL DECOMP/DISCECTOMY FUSION N/A 06/02/2016   Procedure: ANTERIOR CERVICAL DECOMPRESSION FUSION CERVICAL 5-6, CERVICAL 6-7 WITH INSTRUMENTATION AND ALLOGRAFT;  Surgeon: Estill Bamberg, MD;  Location: [facility];  Service: Orthopedics;  Laterality: N/A;  ANTERIOR CERVICAL DECOMPRESSION FUSION CERVICAL 5-6, CERVICAL 6-7 WITH INSTRUMENTATION AND ALLOGRAFT   BIOPSY  06/26/2012   Procedure: BIOPSY;  Surgeon: Lodema Pilot, DO;  Location: [facility];  Service: General;;   CARDIAC ELECTROPHYSIOLOGY STUDY AND ABLATION  08-02-2016   [facility]   (dr Alden Hipp)   pulmonary veins isolation and ablation afib    CARDIAC PACEMAKER PLACEMENT  05-01-2014   [facility]   CHOLECYSTECTOMY  1983   COLONOSCOPY WITH PROPOFOL N/A 07/13/2015   Procedure: COLONOSCOPY WITH PROPOFOL;  Surgeon: Charolett Bumpers, MD;  [facility];  Service: Endoscopy;  Laterality: N/A;   ESOPHAGECTOMY  08-21-2012   [facility]   w/ gastric pull-through   ESOPHAGOGASTRODUODENOSCOPY (EGD) WITH ESOPHAGEAL DILATION  multiple x32 per pt approx.--- last one 02-09-2015   recurrent anastomotic stricture post esophagectomy   HIATAL HERNIA REPAIR  06/26/2012   Procedure: LAPAROSCOPIC REPAIR OF HIATAL HERNIA;  Surgeon: Lodema Pilot, DO;  [facility];  Service: General;;   INCISIONAL HERNIA REPAIR  06/26/2012   Procedure: HERNIA REPAIR INCISIONAL;  Surgeon: Lodema Pilot, DO;  [facility];  Service: General;;   INGUINAL HERNIA REPAIR Bilateral 1982   JEJUNOSTOMY FEEDING TUBE  12/22/2012   Removed 12/ 2016  approx.   KNEE ARTHROSCOPY Left 1990   KNEE ARTHROSCOPY Right 12/2019   LAPAROSCOPIC GASTRIC SLEEVE RESECTION  06/2012   started but abandoned d/t discovery of tumor   LAPAROTOMY  07/12/2021   revision of previous gastric conduit, hiatal hernia repair   MASS EXCISION Left 04/04/2013   Procedure: EXCISION OF SCALP MASS;  Surgeon: Lodema Pilot, DO;  Location: [facility];  Service: General;  Laterality: Left;   pilomatrixoma   NESBIT PROCEDURE N/A 12/09/2016   Procedure: NESBIT PROCEDURE 16 DOT PLICATION;  Surgeon: Ihor Gully, MD;  Location: [facility];  Service: Urology;  Laterality: N/A;   THORACOTOMY  07/12/2021   revision of previous gastric conduit   TONSILLECTOMY  1959   TRANSTHORACIC ECHOCARDIOGRAM  08-06-2015   [facility]   mild LVH, ef 50%/  mild RAE /  moderate LAE/  trivial AR, MR, PR,  and TR    ALLERGIES: Allergies  Allergen Reactions   Chlorthalidone Other (See Comments)    urinary urgency   Lisinopril Other (See Comments)    cough   Metformin Hcl Other (See Comments)    fecal urgency    FAMILY  HISTORY:  Family History  Problem Relation Age of Onset   Hypertension Mother    Heart failure Mother    Prostate cancer Father    Kidney cancer Father    Emphysema Father    Aortic aneurysm Father    Anorexia nervosa Sister    Depression Sister    Anorexia nervosa Sister    Heart disease Sister    Hypertension Brother    Cancer Maternal Grandmother    Heart attack Maternal Grandfather    Heart disease Maternal Grandfather    Cancer Paternal Grandmother    Alcohol abuse Paternal Grandfather    Early death Paternal Grandfather    Tuberculosis Paternal Grandfather    Colon cancer Neg Hx     SOCIAL HISTORY: Social History   Socioeconomic History   Marital status: Married    Spouse name: Not on file   Number of children: 4   Years of education: Not on file   Highest education level: Bachelor's degree (e.g., BA, AB, BS)  Occupational History   Occupation: retired, Medical illustrator, Designer, fashion/clothing  Tobacco Use   Smoking status: Never   Smokeless tobacco: Never  Vaping Use   Vaping status: Never Used  Substance and Sexual Activity   Alcohol use: Yes    Comment: socially   Drug use: Never   Sexual activity: Not on file  Other Topics Concern   Not on file  Social History Narrative   Household- lives w/ wife   Social Determinants of Health   Financial Resource Strain: Low Risk  (04/17/2023)   Overall Financial Resource Strain (CARDIA)    Difficulty of Paying Living Expenses: Not hard at all  Food Insecurity: No Food Insecurity (04/17/2023)   Hunger Vital Sign    Worried About Running Out of Food in the Last Year: Never true    Ran Out of Food in the Last Year: Never true  Transportation Needs: No Transportation Needs (04/17/2023)   PRAPARE - Administrator, Civil Service (Medical): No    Lack of Transportation (Non-Medical): No  Physical Activity: Insufficiently Active (04/17/2023)   Exercise Vital Sign    Days of Exercise per Week: 1 day    Minutes of Exercise per  Session: 30 min  Stress: Stress Concern Present (04/17/2023)   [employer] of Occupational Health - Occupational Stress Questionnaire    Feeling of Stress : To some extent  Social Connections: Moderately Isolated (04/17/2023)  Social Connection and Isolation Panel [NHANES]    Frequency of Communication with Friends and Family: Three times a week    Frequency of Social Gatherings with Friends and Family: Three times a week    Attends Religious Services: Never    Active Member of Clubs or Organizations: No    Attends Banker Meetings: Never    Marital Status: Married    MEDICATIONS:  Current Outpatient Medications  Medication Sig Dispense Refill   Alcohol Swabs ([facility] PREP) 70 % PADS USE AS DIRECTED 200 each 1   apixaban (ELIQUIS) 5 MG TABS tablet Take 1 tablet (5 mg total) by mouth 2 (two) times daily. 180 tablet 1   ferrous sulfate 325 (65 FE) MG tablet 325 mg daily.     FLUoxetine (PROZAC) 20 MG tablet Take 3 tablets (60 mg total) by mouth daily. 270 tablet 1   Insulin Syringe-Needle U-100 (BD VEO INSULIN SYRINGE U/F) 31G X 15/64" 0.3 ML MISC Use as directed in the morning, at noon, in the evening, and at bedtime. 300 each 3   LORazepam (ATIVAN) 1 MG tablet TAKE 1 TABLET (1 MG TOTAL) BY MOUTH 2 (TWO) TIMES DAILY AS NEEDED FOR ANXIETY OR SLEEP. 60 tablet 1   Metoprolol Tartrate (FIRST - METOPROLOL) 10 MG/ML SOLN Take 1.25 mLs by mouth in the morning and at bedtime.     Multiple Vitamins-Minerals (PRESERVISION AREDS PO) Take 1 tablet by mouth daily. Preservision AREDS     rosuvastatin (CRESTOR) 40 MG tablet Take 40 mg by mouth daily.     traMADol (ULTRAM) 50 MG tablet Take 1 tablet (50 mg total) by mouth every 12 (twelve) hours as needed for moderate pain (pain score 4-6). 40 tablet 0   vitamin B-12 (CYANOCOBALAMIN) 1000 MCG tablet Take 1,000 mcg by mouth daily.     HUMULIN 70/30 (70-30) 100 UNIT/ML injection 20 units in the morning with breakfast and 8 units in the evening  with supper. 10 mL 4   No current facility-administered medications for this visit.    PHYSICAL EXAM: Vitals:   08/08/23 0811  BP: 110/70  Pulse: 87  Resp: 20  SpO2: 98%  Weight: 228 lb 6.4 oz (103.6 kg)  Height: 6\' 1"  (1.854 m)    Body mass index is 30.13 kg/m.  Wt Readings from Last 3 Encounters:  08/08/23 228 lb 6.4 oz (103.6 kg)  07/26/23 228 lb (103.4 kg)  06/23/23 227 lb 2 oz (103 kg)    General: Well developed, well nourished male in no apparent distress.  HEENT: AT/Je[place], no external lesions.  Eyes: Conjunctiva clear and no icterus. Neck: Neck supple  Lungs: Respirations not labored Neurologic: Alert, oriented, normal speech Extremities / Skin: Dry. No sores or rashes noted.  Psychiatric: Does not appear depressed or anxious  Diabetic Foot Exam - Simple   Simple Foot Form Diabetic Foot exam was performed with the following findings: Yes 08/08/2023  8:31 AM  Visual Inspection See comments: Yes Sensation Testing See comments: Yes Pulse Check See comments: Yes Comments No ulcer. Dry skin, callus + bilaterally, mild skin cracks on heels. Monofilament sensory exam intact on right and mildly decreased on left. DP palpable bilaterally. Dystrophic nails bilaterally.    LABS Reviewed Lab Results  Component Value Date   HGBA1C 6.8 (A) 08/08/2023   HGBA1C 7.3 (H) 02/08/2023   HGBA1C 7.3 04/06/2022   No results found for: "FRUCTOSAMINE" Lab Results  Component Value Date   CHOL 118 01/05/2023   HDL 42  01/05/2023   LDLCALC 44 01/05/2023   TRIG 197 (H) 01/05/2023   CHOLHDL 2.8 01/05/2023   Lab Results  Component Value Date   MICRALBCREAT 12.2 04/07/2023   Lab Results  Component Value Date   CREATININE 0.95 06/18/2023   No results found for: "GFR"  ASSESSMENT / PLAN  1. Controlled type 2 diabetes mellitus with diabetic polyneuropathy, unspecified whether long term insulin use (HCC)     Diabetes Mellitus type 2, complicated by diabetic retinopathy and  neuropathy. - Diabetic status / severity: Fair control.  Lab Results  Component Value Date   HGBA1C 6.8 (A) 08/08/2023    - Hemoglobin A1c goal : <7%  CGM data reviewed mostly hyperglycemia mainly related with lunch and occasional hypoglycemia overnight.  Adjusted insulin regimen as follows.  - Medications: Adjusted as follows.  I) Humulin 70/30 : Changed from 16 to 20 units in the morning with breakfast and from 12 units to 8 units in the evening with supper.    - will likely avoid GLP-1 receptor agonist, history of gastric/esophageal surgery and upper GI symptoms.  - Home glucose testing: CGM/freestyle libre and check as needed. - Discussed/ Gave Hypoglycemia treatment plan.  # Consult : not required at this time.   # Annual urine for microalbuminuria/ creatinine ratio, no microalbuminuria currently.  Last  Lab Results  Component Value Date   MICRALBCREAT 12.2 04/07/2023    # Foot check nightly / neuropathy.  Advised to follow-up with podiatry.  # He has diabetic retinopathy, following with ophthalmology every 6 months.  - Diet: Eat reasonable portion sizes to promote a healthy weight - Life style / activity / exercise: Discussed.  2. Blood pressure  -  BP Readings from Last 1 Encounters:  08/08/23 110/70    - Control is in target.  - No change in current plans.  3. Lipid status / Hyperlipidemia - Last  Lab Results  Component Value Date   LDLCALC 44 01/05/2023   - Continue rosuvastatin 40 mg daily.   Diagnoses and all orders for this visit:  Controlled type 2 diabetes mellitus with diabetic polyneuropathy, unspecified whether long term insulin use (HCC) -     POCT glycosylated hemoglobin (Hb A1C)  Other orders -     HUMULIN 70/30 (70-30) 100 UNIT/ML injection; 20 units in the morning with breakfast and 8 units in the evening with supper.     DISPOSITION Follow up in clinic in 3  months suggested.   All questions answered and patient verbalized  understanding of the plan.  [place] Gilad Dugger, MD [facility] [address] [place], [address] [place], [place] [address] Phone # [phone]  At least part of this note was generated using voice recognition software. Inadvertent word errors may have occurred, which were not recognized during the proofreading process.

## 2023-08-09 ENCOUNTER — Encounter: Payer: Self-pay | Admitting: Endocrinology

## 2023-08-14 DIAGNOSIS — Z45018 Encounter for adjustment and management of other part of cardiac pacemaker: Secondary | ICD-10-CM | POA: Diagnosis not present

## 2023-08-14 DIAGNOSIS — I4819 Other persistent atrial fibrillation: Secondary | ICD-10-CM | POA: Diagnosis not present

## 2023-08-14 DIAGNOSIS — Z7901 Long term (current) use of anticoagulants: Secondary | ICD-10-CM | POA: Diagnosis not present

## 2023-08-21 ENCOUNTER — Encounter: Payer: Self-pay | Admitting: Internal Medicine

## 2023-08-21 ENCOUNTER — Ambulatory Visit: Payer: Medicare PPO | Admitting: Internal Medicine

## 2023-08-21 VITALS — BP 128/86 | HR 85 | Temp 98.3°F | Resp 18 | Ht 73.0 in | Wt 229.5 lb

## 2023-08-21 DIAGNOSIS — F419 Anxiety disorder, unspecified: Secondary | ICD-10-CM

## 2023-08-21 DIAGNOSIS — Z79899 Other long term (current) drug therapy: Secondary | ICD-10-CM | POA: Diagnosis not present

## 2023-08-21 DIAGNOSIS — I951 Orthostatic hypotension: Secondary | ICD-10-CM

## 2023-08-21 NOTE — Progress Notes (Signed)
Subjective:    Patient ID: Lawrence Forth., male    DOB: 10-28-1950, 72 y.o.   MRN: [id number]  DOS:  08/21/2023 Type of visit - description: Follow-up  Since LOV is doing well. "The best I felt in a while". Had a rib fracture, pain is fading away. No further falls. Saw endocrinology, note reviewed.   Review of Systems See above   Past Medical History:  Diagnosis Date   Anxiety 01/20/2023   Bradycardia    Depression    GERD (gastroesophageal reflux disease)    H/O hiatal hernia    s/p  repair 06-26-2012   History of esophagectomy 08-21-2012  AT [facility]   History of kidney stones 2006   History of malignant neoplasm of esophagus ONCOLOGIST-  DR D'AMICO AT [facility]-- PER LAST NOTE 01/ 2018  NO RECURRENCE   dx 10/ 2013  Stage 2A (T2 N0)  08-21-2012 s/p  esophagectomy w/ gastric pull-through [facility]) post residual recurrent anastomotic stricture (multiple EGD w/ dilations)   History of transient ischemic attack (TIA) 2003   no residual   Hypertension    Long term (current) use of anticoagulants    xarelto   On dofetilide therapy    PAF (paroxysmal atrial fibrillation) (HCC) first dx 2009   primary cardiologist-  dr Verdis Prime /  EP cardiologist -- dr Alden Hipp ([facility])  s/p  afib ablation 08-02-2016   Pancreatic cyst    x2    Peyronie's disease    Presence of permanent cardiac pacemaker implanted 05-01-2014  at [facility]   Dr.Daubert -[facility] follows   Pulmonary nodule, right    upper and middle lobes --  per last CT stable   Type 2 diabetes mellitus treated with insulin (HCC)    Wears glasses     Past Surgical History:  Procedure Laterality Date   ANTERIOR CERVICAL DECOMP/DISCECTOMY FUSION N/A 06/02/2016   Procedure: ANTERIOR CERVICAL DECOMPRESSION FUSION CERVICAL 5-6, CERVICAL 6-7 WITH INSTRUMENTATION AND ALLOGRAFT;  Surgeon: Estill Bamberg, MD;  Location: [facility];  Service: Orthopedics;  Laterality: N/A;  ANTERIOR CERVICAL DECOMPRESSION FUSION CERVICAL 5-6, CERVICAL  6-7 WITH INSTRUMENTATION AND ALLOGRAFT   BIOPSY  06/26/2012   Procedure: BIOPSY;  Surgeon: Lodema Pilot, DO;  [facility];  Service: General;;   CARDIAC ELECTROPHYSIOLOGY STUDY AND ABLATION  08-02-2016   [facility]   (dr Alden Hipp)   pulmonary veins isolation and ablation afib   CARDIAC PACEMAKER PLACEMENT  05-01-2014   [facility]   CHOLECYSTECTOMY  1983   COLONOSCOPY WITH PROPOFOL N/A 07/13/2015   Procedure: COLONOSCOPY WITH PROPOFOL;  Surgeon: Charolett Bumpers, MD;  [facility];  Service: Endoscopy;  Laterality: N/A;   ESOPHAGECTOMY  08-21-2012   [facility]   w/ gastric pull-through   ESOPHAGOGASTRODUODENOSCOPY (EGD) WITH ESOPHAGEAL DILATION  multiple x32 per pt approx.--- last one 02-09-2015   recurrent anastomotic stricture post esophagectomy   HIATAL HERNIA REPAIR  06/26/2012   Procedure: LAPAROSCOPIC REPAIR OF HIATAL HERNIA;  Surgeon: Lodema Pilot, DO;  [facility];  Service: General;;   INCISIONAL HERNIA REPAIR  06/26/2012   Procedure: HERNIA REPAIR INCISIONAL;  Surgeon: Lodema Pilot, DO;  [facility];  Service: General;;   INGUINAL HERNIA REPAIR Bilateral 1982   JEJUNOSTOMY FEEDING TUBE  12/22/2012   Removed 12/ 2016  approx.   KNEE ARTHROSCOPY Left 1990   KNEE ARTHROSCOPY Right 12/2019   LAPAROSCOPIC GASTRIC SLEEVE RESECTION  06/2012   started but abandoned d/t discovery of tumor   LAPAROTOMY  07/12/2021   revision of previous gastric conduit, hiatal hernia repair   MASS EXCISION Left 04/04/2013   Procedure: EXCISION OF SCALP MASS;  Surgeon: Lodema Pilot, DO;  [facility];  Service: General;  Laterality: Left;   pilomatrixoma   NESBIT PROCEDURE N/A 12/09/2016   Procedure: NESBIT PROCEDURE 16 DOT PLICATION;  Surgeon: Ihor Gully, MD;  Location[facility];  Service: Urology;  Laterality: N/A;   THORACOTOMY  07/12/2021   revision of previous gastric conduit   TONSILLECTOMY  1959   TRANSTHORACIC ECHOCARDIOGRAM  08-06-2015   [facility]   mild LVH, ef 50%/   mild RAE /  moderate LAE/  trivial AR, MR, PR,  and TR    Current Outpatient Medications  Medication Instructions   Alcohol Swabs ([facility] PREP) 70 % PADS See admin instructions   apixaban (ELIQUIS) 5 mg, Oral, 2 times daily   cyanocobalamin (VITAMIN B12) 1,000 mcg, Daily   ferrous sulfate 325 mg, Daily   FLUoxetine (PROZAC) 60 mg, Oral, Daily   HUMULIN 70/30 (70-30) 100 UNIT/ML injection 20 units in the morning with breakfast and 8 units in the evening with supper.   Insulin Syringe-Needle U-100 (BD VEO INSULIN SYRINGE U/F) 31G X 15/64" 0.3 ML MISC Use as directed in the morning, at noon, in the evening, and at bedtime.   LORazepam (ATIVAN) 1 mg, Oral, 2 times daily PRN   Metoprolol Tartrate (FIRST - METOPROLOL) 10 MG/ML SOLN 1.25 mLs, 2 times daily   Multiple Vitamins-Minerals (PRESERVISION AREDS PO) 1 tablet, Daily   rosuvastatin (CRESTOR) 40 mg, Daily   traMADol (ULTRAM) 50 mg, Oral, Every 12 hours PRN       Objective:   Physical Exam BP 128/86   Pulse 85   Temp 98.3 F (36.8 C) (Oral)   Resp 18   Ht 6\' 1"  (1.854 m)   Wt 229 lb 8 oz (104.1 kg)   SpO2 98%   BMI 30.28 kg/m  General:   Well developed, NAD, BMI noted. HEENT:  Normocephalic . Face symmetric, atraumatic Lungs:  CTA B Normal respiratory effort, no intercostal retractions, no accessory muscle use. Heart: RRR,  no murmur.  Lower extremities: no pretibial edema bilaterally  Skin: Not pale. Not jaundice Neurologic:  alert & oriented X3.  Speech normal, gait appropriate for age and unassisted Psych--  Cognition and judgment appear intact.  Cooperative with normal attention span and concentration.  Behavior appropriate. No anxious or depressed appearing.      Assessment     Assessment (new patient, previous PCP retired) DM- per endo Mild nonproliferative diabetic retinopathy with macular edema. HTN High cholesterol Anxiety  GI: Esophageal neoplasm 2013 found during a gastric bypass  Hiatal hernia: R  thoracotomy Laparotomy lysis of adhesions for repair of paraesophageal hernia in 2022  Cardiovascular: A-fib dx 2014, anticoagulated Pacemaker 2015, Ablation @ [facility] 2017 TIA 2003 ?  MRI negative, MRA some atherosclerosis distal R vertebral artery? Orthostatic hypotension CAD: Moderate-per CT coronary angiogram 01/2023, medical mngmt [facility]: see surgeries, on tramadol   PLAN Rib fractures: Pain is essentially resolved, still has some leftover tramadol. Fall, orthostatic hypotension, fall prevention: Still gets slightly dizzy if he gets up fast but has learned to manage.  No further falls.  Has rearrange the furniture in his house to prevent falls, drinking wine has significantly decreased to 1 glass some days. DM: Saw a new endocrinologist Dr. Erroll Luna 08/08/2023 Gross hematuria: Workup negative, last visit with urology 07/26/2023 Increased alkaline phosphatase: Reports his gastroenterology  did some labs to r/o  "Wilson disease", has recommended a liver biopsy. Anxiety: On fluoxetine and lorazepam.  UDS and contract today. RTC 5 to 6 months.

## 2023-08-21 NOTE — Assessment & Plan Note (Signed)
Rib fractures: Pain is essentially resolved, still has some leftover tramadol. Fall, orthostatic hypotension, fall prevention: Still gets slightly dizzy if he gets up fast but has learned to manage.  No further falls.  Has rearrange the furniture in his house to prevent falls, drinking wine has significantly decreased to 1 glass some days. DM: Saw a new endocrinologist Dr. Erroll Luna 08/08/2023 Gross hematuria: Workup negative, last visit with urology 07/26/2023 Increased alkaline phosphatase: Reports his gastroenterology did some labs to r/o  "Wilson disease", has recommended a liver biopsy. Anxiety: On fluoxetine and lorazepam.  UDS and contract today. RTC 5 to 6 months.

## 2023-08-21 NOTE — Patient Instructions (Addendum)
Vaccines I recommend: Covid booster      GO TO THE LAB : Provide a urine sample   Next visit with me in 5 to 6 months Please schedule it at the front desk

## 2023-08-23 ENCOUNTER — Other Ambulatory Visit (HOSPITAL_COMMUNITY): Payer: Self-pay | Admitting: Gastroenterology

## 2023-08-23 DIAGNOSIS — R945 Abnormal results of liver function studies: Secondary | ICD-10-CM | POA: Diagnosis not present

## 2023-08-23 DIAGNOSIS — K862 Cyst of pancreas: Secondary | ICD-10-CM

## 2023-08-23 DIAGNOSIS — K7460 Unspecified cirrhosis of liver: Secondary | ICD-10-CM

## 2023-08-23 DIAGNOSIS — Z8711 Personal history of peptic ulcer disease: Secondary | ICD-10-CM | POA: Diagnosis not present

## 2023-08-23 DIAGNOSIS — Z8501 Personal history of malignant neoplasm of esophagus: Secondary | ICD-10-CM | POA: Diagnosis not present

## 2023-08-23 DIAGNOSIS — Z8679 Personal history of other diseases of the circulatory system: Secondary | ICD-10-CM | POA: Diagnosis not present

## 2023-08-23 LAB — DRUG MONITORING PANEL 375977 , URINE
Alcohol Metabolites: POSITIVE ng/mL — AB (ref <500)
Alphahydroxyalprazolam: NEGATIVE ng/mL (ref <25)
Alphahydroxymidazolam: NEGATIVE ng/mL (ref <50)
Alphahydroxytriazolam: NEGATIVE ng/mL (ref <50)
Aminoclonazepam: NEGATIVE ng/mL (ref <25)
Amphetamines: NEGATIVE ng/mL (ref <500)
Barbiturates: NEGATIVE ng/mL (ref <300)
Benzodiazepines: POSITIVE ng/mL — AB (ref <100)
Cocaine Metabolite: NEGATIVE ng/mL (ref <150)
Desmethyltramadol: 1547 ng/mL — ABNORMAL HIGH (ref <100)
Ethyl Glucuronide (ETG): >100000 ng/mL — ABNORMAL HIGH (ref <500)
Ethyl Sulfate (ETS): >100000 ng/mL — ABNORMAL HIGH (ref <100)
Hydroxyethylflurazepam: NEGATIVE ng/mL (ref <50)
Lorazepam: 2463 ng/mL — ABNORMAL HIGH (ref <50)
Marijuana Metabolite: NEGATIVE ng/mL (ref <20)
Nordiazepam: NEGATIVE ng/mL (ref <50)
Opiates: NEGATIVE ng/mL (ref <100)
Oxazepam: NEGATIVE ng/mL (ref <50)
Oxycodone: NEGATIVE ng/mL (ref <100)
Temazepam: NEGATIVE ng/mL (ref <50)
Tramadol: >10000 ng/mL — ABNORMAL HIGH (ref <100)

## 2023-08-23 LAB — DM TEMPLATE

## 2023-08-24 ENCOUNTER — Telehealth: Payer: Self-pay | Admitting: *Deleted

## 2023-08-24 ENCOUNTER — Encounter (HOSPITAL_BASED_OUTPATIENT_CLINIC_OR_DEPARTMENT_OTHER): Payer: Self-pay

## 2023-08-24 ENCOUNTER — Ambulatory Visit (HOSPITAL_BASED_OUTPATIENT_CLINIC_OR_DEPARTMENT_OTHER)
Admission: RE | Admit: 2023-08-24 | Discharge: 2023-08-24 | Disposition: A | Discharge status: Home / Self Care | Payer: Medicare PPO | Source: Ambulatory Visit | Attending: Gastroenterology | Admitting: Gastroenterology

## 2023-08-24 DIAGNOSIS — R935 Abnormal findings on diagnostic imaging of other abdominal regions, including retroperitoneum: Secondary | ICD-10-CM | POA: Diagnosis not present

## 2023-08-24 DIAGNOSIS — K862 Cyst of pancreas: Secondary | ICD-10-CM | POA: Insufficient documentation

## 2023-08-24 DIAGNOSIS — K8689 Other specified diseases of pancreas: Secondary | ICD-10-CM | POA: Diagnosis not present

## 2023-08-24 DIAGNOSIS — K449 Diaphragmatic hernia without obstruction or gangrene: Secondary | ICD-10-CM | POA: Diagnosis not present

## 2023-08-24 MED ORDER — IOHEXOL 300 MG/ML  SOLN
100.0000 mL | Freq: Once | INTRAMUSCULAR | Status: AC | PRN
  Administered 2023-08-24: 125 mL via INTRAVENOUS

## 2023-08-24 NOTE — Telephone Encounter (Signed)
   Patient Name: Lawrence [name].  DOB: [birth year] MRN: [id number]  Primary Cardiologist: Olga Millers, MD  Clinical pharmacists have reviewed the patient's past medical history, labs, and current medications as part of preoperative protocol coverage. The following recommendations have been made:  Per office protocol, patient can hold Eliquis for 2 days prior to procedure.    I will route this recommendation to the requesting party via Epic fax function and remove from pre-op pool.  Please call with questions.  Napoleon Form, Leodis Rains, NP 08/24/2023, 12:58 PM

## 2023-08-24 NOTE — Telephone Encounter (Signed)
Pharmacy please advise on holding Eliquis prior to ultrasound-guided liver biopsy scheduled for 09/15/2023. Thank you.

## 2023-08-24 NOTE — Telephone Encounter (Signed)
Patient with diagnosis of afib on Eliquis for anticoagulation.    Procedure: US LIVER BIOPSY  Date of procedure: 09/15/23   CHA2DS2-VASc Score = 6   This indicates a 9.7% annual risk of stroke. The patient's score is based upon: CHF History: 0 HTN History: 1 Diabetes History: 1 Stroke History: 2 Vascular Disease History: 1 Age Score: 1 Gender Score: 0      CrCl 78 ml/min Platelet count 186  Per office protocol, patient can hold Eliquis for 2 days prior to procedure.     **This guidance is not considered finalized until pre-operative APP has relayed final recommendations.**

## 2023-08-24 NOTE — Telephone Encounter (Signed)
   Pre-operative Risk Assessment    Patient Name: Cassie Fincke.  DOB: [birth year] MRN: [id number]      Request for Surgical Clearance    Procedure:   US LIVER BIOPSY  Date of Surgery:  Clearance 09/15/23                                 Surgeon:   Surgeon's Group or Practice Name:  [facility] RADIOLOGY DEPT Phone number:   Fax number:  [phone]   Type of Clearance Requested:   - Pharmacy:  Hold Apixaban (Eliquis) X'S 48 HOURS PRIO   Type of Anesthesia:  Not Indicated   Additional requests/questions:    Wilhemina Cash   08/24/2023, 9:04 AM

## 2023-08-25 ENCOUNTER — Other Ambulatory Visit: Payer: Self-pay | Admitting: Internal Medicine

## 2023-08-25 MED ORDER — TRAMADOL HCL 50 MG PO TABS: 50.0000 mg | ORAL_TABLET | Freq: Every day | ORAL | 0 refills | Status: DC | PRN

## 2023-08-25 NOTE — Telephone Encounter (Signed)
PDMP okay, Rx sent. Please send the patient a message:  Lawrence [name], I refilled your medication, I know that the pain is getting better day by day.  Please reduce tramadol to only 1 tablet a day at most.

## 2023-08-25 NOTE — Telephone Encounter (Signed)
Requesting: tramadol 50mg   Contract: 03/31/23 UDS: 08/21/23 Last Visit: 08/21/23 Next Visit: 01/22/24 Last Refill: 07/28/23 #40 and 0RF   Please Advise

## 2023-08-28 DIAGNOSIS — E1165 Type 2 diabetes mellitus with hyperglycemia: Secondary | ICD-10-CM | POA: Diagnosis not present

## 2023-09-08 ENCOUNTER — Other Ambulatory Visit: Payer: Self-pay | Admitting: Endocrinology

## 2023-09-10 NOTE — H&P (Signed)
 Chief Complaint: Evaluation for cirrhosis. Request is for liver biopsy.   Referring Physician(s): Amjyfayjuu,Ejmjh  Supervising Physician: Hughes Simmonds  Patient Status: [facility] - Out-pt  History of Present Illness: Lawrence [name]. is a 73 y.o. male  outpatient. History of DM, a fib (on eliquis ), HTN, TIA, GERD, esophageal cancer. Found to have elevated alkaline phosphatase CT Abd Pelvis from 12.19.24 reads Small cystic lesions of the tip of the pancreatic tail measuring no greater than 1.1 cm. No solid component or suspicious contrast enhancement. These are most consistent with small side branch IPMNs and almost certainly benign. Given small size and other known.primary malignancy no specific further follow-up or characterization is required. No focal liver abnormality is seen. Team is requesting liver biopsy for further evaluation for cirrhosis.  Currently without any significant complaints. Patient alert and laying in bed,calm. Denies any fevers, headache, chest pain, SOB, cough, abdominal pain, nausea, vomiting or bleeding.   Past Medical History:  Diagnosis Date   Anxiety 01/20/2023   Bradycardia    Depression    GERD (gastroesophageal reflux disease)    H/O hiatal hernia    s/p  repair 06-26-2012   History of esophagectomy 08-21-2012  AT [facility]   History of kidney stones 2006   History of malignant neoplasm of esophagus ONCOLOGIST-  DR D'AMICO AT [facility]-- PER LAST NOTE 01/ 2018  NO RECURRENCE   dx 10/ 2013  Stage 2A (T2 N0)  08-21-2012 s/p  esophagectomy w/ gastric pull-through [facility]) post residual recurrent anastomotic stricture (multiple EGD w/ dilations)   History of transient ischemic attack (TIA) 2003   no residual   Hypertension    Long term (current) use of anticoagulants    xarelto    On dofetilide  therapy    PAF (paroxysmal atrial fibrillation) (HCC) first dx 2009   primary cardiologist-  dr victory sharps /  EP cardiologist -- dr melchor ([facility])  s/p  afib ablation  08-02-2016   Pancreatic cyst    x2    Peyronie's disease    Presence of permanent cardiac pacemaker implanted 05-01-2014  at [facility]   Dr.Daubert -[facility] follows   Pulmonary nodule, right    upper and middle lobes --  per last CT stable   Type 2 diabetes mellitus treated with insulin  (HCC)    Wears glasses     Past Surgical History:  Procedure Laterality Date   ANTERIOR CERVICAL DECOMP/DISCECTOMY FUSION N/A 06/02/2016   Procedure: ANTERIOR CERVICAL DECOMPRESSION FUSION CERVICAL 5-6, CERVICAL 6-7 WITH INSTRUMENTATION AND ALLOGRAFT;  Surgeon: Oneil Priestly, MD;  Location: [facility] OR;  Service: Orthopedics;  Laterality: N/A;  ANTERIOR CERVICAL DECOMPRESSION FUSION CERVICAL 5-6, CERVICAL 6-7 WITH INSTRUMENTATION AND ALLOGRAFT   BIOPSY  06/26/2012   Procedure: BIOPSY;  Surgeon: Redell Faith, DO;  Location: [facility] ORS;  Service: General;;   CARDIAC ELECTROPHYSIOLOGY STUDY AND ABLATION  08-02-2016   [facility]   (dr melchor)   pulmonary veins isolation and ablation afib   CARDIAC PACEMAKER PLACEMENT  05-01-2014   [facility]   CHOLECYSTECTOMY  1983   COLONOSCOPY WITH PROPOFOL  N/A 07/13/2015   Procedure: COLONOSCOPY WITH PROPOFOL ;  Surgeon: Gladis MARLA Louder, MD;  Location: [facility] ENDOSCOPY;  Service: Endoscopy;  Laterality: N/A;   ESOPHAGECTOMY  08-21-2012   [facility]   w/ gastric pull-through   ESOPHAGOGASTRODUODENOSCOPY (EGD) WITH ESOPHAGEAL DILATION  multiple x32 per pt approx.--- last one 02-09-2015   recurrent anastomotic stricture post esophagectomy   HIATAL HERNIA REPAIR  06/26/2012   Procedure: LAPAROSCOPIC REPAIR OF HIATAL  HERNIA;  Surgeon: Redell Faith, DO;  Location: [facility] ORS;  Service: General;;   INCISIONAL HERNIA REPAIR  06/26/2012   Procedure: HERNIA REPAIR INCISIONAL;  Surgeon: Redell Faith, DO;  Location: [facility] ORS;  Service: General;;   INGUINAL HERNIA REPAIR Bilateral 1982   JEJUNOSTOMY FEEDING TUBE  12/22/2012   Removed 12/ 2016  approx.   KNEE ARTHROSCOPY Left 1990   KNEE ARTHROSCOPY Right  12/2019   LAPAROSCOPIC GASTRIC SLEEVE RESECTION  06/2012   started but abandoned d/t discovery of tumor   LAPAROTOMY  07/12/2021   revision of previous gastric conduit, hiatal hernia repair   MASS EXCISION Left 04/04/2013   Procedure: EXCISION OF SCALP MASS;  Surgeon: Redell Faith, DO;  Location: [facility] ORS;  Service: General;  Laterality: Left;   pilomatrixoma   NESBIT PROCEDURE N/A 12/09/2016   Procedure: NESBIT PROCEDURE 16 DOT PLICATION;  Surgeon: Mark Ottelin, MD;  Location: [facility];  Service: Urology;  Laterality: N/A;   THORACOTOMY  07/12/2021   revision of previous gastric conduit   TONSILLECTOMY  1959   TRANSTHORACIC ECHOCARDIOGRAM  08-06-2015   [facility]   mild LVH, ef 50%/  mild RAE /  moderate LAE/  trivial AR, MR, PR,  and TR    Allergies: Chlorthalidone, Lisinopril , and Metformin  hcl  Medications: Prior to Admission medications   Medication Sig Start Date End Date Taking? Authorizing Provider  Alcohol Swabs ([employer] PREP) 70 % PADS USE AS DIRECTED 05/11/23   Amon Aloysius BRAVO, MD  apixaban  (ELIQUIS ) 5 MG TABS tablet Take 1 tablet (5 mg total) by mouth 2 (two) times daily. 03/08/23   Pietro Redell RAMAN, MD  ferrous sulfate 325 (65 FE) MG tablet 325 mg daily.    [provider]  FLUoxetine  (PROZAC ) 20 MG tablet Take 3 tablets (60 mg total) by mouth daily. 08/02/23   Amon Aloysius BRAVO, MD  HUMULIN  70/30 (70-30) 100 UNIT/ML injection 16 UNITS IN THE MORNING WITH BREAKFAST AND 12 UNITS IN THE EVENING WITH SUPPER. 09/08/23   Thapa, Sudan, MD  Insulin  Syringe-Needle U-100 (BD VEO INSULIN  SYRINGE U/F) 31G X 15/64 0.3 ML MISC Use as directed in the morning, at noon, in the evening, and at bedtime. 07/24/23   Paz, Jose E, MD  LORazepam  (ATIVAN ) 1 MG tablet TAKE 1 TABLET (1 MG TOTAL) BY MOUTH 2 (TWO) TIMES DAILY AS NEEDED FOR ANXIETY OR SLEEP. 08/07/23   Paz, Jose E, MD  Metoprolol  Tartrate (FIRST - METOPROLOL ) 10 MG/ML SOLN Take 1.25 mLs by mouth in the morning and at bedtime. 01/25/23    Paz, Jose E, MD  Multiple Vitamins-Minerals (PRESERVISION AREDS PO) Take 1 tablet by mouth daily. Preservision AREDS    [provider]  rosuvastatin  (CRESTOR ) 40 MG tablet Take 40 mg by mouth daily. 01/25/23   Amon Aloysius BRAVO, MD  traMADol  (ULTRAM ) 50 MG tablet Take 1 tablet (50 mg total) by mouth daily as needed for moderate pain (pain score 4-6). 08/25/23   Paz, Jose E, MD  vitamin B-12 (CYANOCOBALAMIN ) 1000 MCG tablet Take 1,000 mcg by mouth daily.    [provider]     Family History  Problem Relation Age of Onset   Hypertension Mother    Heart failure Mother    Prostate cancer Father    Kidney cancer Father    Emphysema Father    Aortic aneurysm Father    Anorexia nervosa Sister    Depression Sister    Anorexia nervosa Sister    Heart disease  Sister    Hypertension Brother    Cancer Maternal Grandmother    Heart attack Maternal Grandfather    Heart disease Maternal Grandfather    Cancer Paternal Grandmother    Alcohol abuse Paternal Grandfather    Early death Paternal Grandfather    Tuberculosis Paternal Grandfather    Colon cancer Neg Hx     Social History   Socioeconomic History   Marital status: Married    Spouse name: Not on file   Number of children: 4   Years of education: Not on file   Highest education level: Bachelor's degree (e.g., BA, AB, BS)  Occupational History   Occupation: retired, medical illustrator, designer, fashion/clothing  Tobacco Use   Smoking status: Never   Smokeless tobacco: Never  Vaping Use   Vaping status: Never Used  Substance and Sexual Activity   Alcohol use: Yes    Comment: socially   Drug use: Never   Sexual activity: Not on file  Other Topics Concern   Not on file  Social History Narrative   Household- lives w/ wife   Social Drivers of Corporate Investment Banker Strain: Low Risk  (04/17/2023)   Overall Financial Resource Strain (CARDIA)    Difficulty of Paying Living Expenses: Not hard at all  Food Insecurity: No Food Insecurity  (04/17/2023)   Hunger Vital Sign    Worried About Running Out of Food in the Last Year: Never true    Ran Out of Food in the Last Year: Never true  Transportation Needs: No Transportation Needs (04/17/2023)   PRAPARE - Administrator, Civil Service (Medical): No    Lack of Transportation (Non-Medical): No  Physical Activity: Insufficiently Active (04/17/2023)   Exercise Vital Sign    Days of Exercise per Week: 1 day    Minutes of Exercise per Session: 30 min  Stress: Stress Concern Present (04/17/2023)   [employer] of Occupational Health - Occupational Stress Questionnaire    Feeling of Stress : To some extent  Social Connections: Moderately Isolated (04/17/2023)   Social Connection and Isolation Panel [NHANES]    Frequency of Communication with Friends and Family: Three times a week    Frequency of Social Gatherings with Friends and Family: Three times a week    Attends Religious Services: Never    Active Member of Clubs or Organizations: No    Attends Banker Meetings: Never    Marital Status: Married    Review of Systems: A 12 point ROS discussed and pertinent positives are indicated in the HPI above.  All other systems are negative.  Review of Systems  Constitutional:  Negative for chills and fever.  Respiratory:  Negative for cough and shortness of breath.   Cardiovascular:  Negative for chest pain.  Gastrointestinal:  Negative for abdominal pain, diarrhea, nausea and vomiting.  Musculoskeletal:  Negative for back pain.  Skin:  Negative for color change.    Vital Signs: BP 126/75   Pulse 88   Temp 99 F (37.2 C) (Oral)   Resp 18   Ht 6' 1 (1.854 m)   Wt 235 lb (106.6 kg)   SpO2 97%   BMI 31.00 kg/m   Advance Care Plan: The advanced care plan/surrogate decision maker was discussed at the time of visit and documented in the medical record.  Patient desires FULL CODE.  Physical Exam Vitals reviewed.  Constitutional:      General: He  is not in acute distress. HENT:  Head: Normocephalic.     Mouth/Throat:     Mouth: Mucous membranes are moist.     Pharynx: Oropharynx is clear. No oropharyngeal exudate or posterior oropharyngeal erythema.  Cardiovascular:     Rate and Rhythm: Normal rate and regular rhythm.  Pulmonary:     Effort: Pulmonary effort is normal.     Breath sounds: Normal breath sounds.  Abdominal:     General: There is no distension.     Palpations: Abdomen is soft.     Tenderness: There is no abdominal tenderness.  Skin:    General: Skin is warm and dry.  Neurological:     Mental Status: He is alert and oriented to person, place, and time.  Psychiatric:        Mood and Affect: Mood normal.        Behavior: Behavior normal.        Thought Content: Thought content normal.        Judgment: Judgment normal.     Imaging: CT ABDOMEN W WO CONTRAST Result Date: 08/29/2023 CLINICAL DATA:  Follow-up pancreatic cyst identified by prior CT, history of esophageal cancer * Tracking Code: BO * EXAM: CT ABDOMEN WITHOUT AND WITH CONTRAST TECHNIQUE: Multidetector CT imaging of the abdomen was performed following the standard protocol before and following the bolus administration of intravenous contrast. RADIATION DOSE REDUCTION: This exam was performed according to the departmental dose-optimization program which includes automated exposure control, adjustment of the mA and/or kV according to patient size and/or use of iterative reconstruction technique. CONTRAST:  OMNIPAQUE  IOHEXOL  300 MG/ML  SOLN COMPARISON:  05/16/2023 FINDINGS: Lower chest: No acute abnormality. Status post esophageal pull-through. Large hiatal hernia containing the pancreatic tail as well as nonobstructed transverse colon. Hepatobiliary: No focal liver abnormality is seen. Status post cholecystectomy. No biliary dilatation. Pancreas: Small cystic lesions of the tip of the pancreatic tail measuring no greater than 1.1 cm (series 305, image 9,  8). No solid component or suspicious contrast enhancement. Diffuse fatty atrophy of the pancreas. No pancreatic ductal dilatation or surrounding inflammatory changes. Spleen: Normal in size without significant abnormality. Benign granulomatous calcifications of the spleen, for which no further follow-up or characterization is required. Adrenals/Urinary Tract: Adrenal glands are unremarkable. Kidneys are normal, without renal calculi, solid lesion, or hydronephrosis. Stomach/Bowel: Status post pull-through esophagectomy. Descending duodenal diverticulum. No evidence of bowel wall thickening, distention, or inflammatory changes. Pancolonic diverticulosis. Vascular/Lymphatic: Aortic atherosclerosis. No enlarged abdominal lymph nodes. Other: No abdominal wall hernia or abnormality. No ascites. Musculoskeletal: No acute or significant osseous findings. IMPRESSION: 1. Small cystic lesions of the tip of the pancreatic tail measuring no greater than 1.1 cm. No solid component or suspicious contrast enhancement. These are most consistent with small side branch IPMNs and almost certainly benign. Given small size and other known primary malignancy no specific further follow-up or characterization is required. 2. Status post esophageal pull-through. Large hiatal hernia containing the pancreatic tail as well as nonobstructed transverse colon. 3. Pancolonic diverticulosis without evidence of acute diverticulitis. Aortic Atherosclerosis (ICD10-I70.0). Electronically Signed   By: Marolyn JONETTA Jaksch M.D.   On: 08/29/2023 18:41    Labs:  CBC: Recent Labs    04/17/23 1408 04/25/23 1205 06/18/23 0455 09/15/23 0709  WBC 9.9 10.0 10.8* 7.6  HGB 13.3 13.4 13.1 13.3  HCT 40.1 40.0 38.4* 39.5  PLT 209.0 180 191 174    COAGS: Recent Labs    09/15/23 0709  INR 1.1    BMP: Recent Labs  12/29/22 0909 01/05/23 1001 04/25/23 1205 06/18/23 0455  NA 136 136 135 134*  K 5.7* 4.7 5.3* 3.9  CL 98 96 98 98  CO2 24 21 28  23   GLUCOSE 141* 235* 193* 129*  BUN 13 13 19 12   CALCIUM  9.7 9.1 9.2 8.7*  CREATININE 1.02 1.24 1.30* 0.95  GFRNONAA  --   --  59* >60    LIVER FUNCTION TESTS: Recent Labs    02/08/23 1225 03/21/23 1017 04/25/23 1205 06/18/23 0455  BILITOT 0.7 0.6 0.6 0.5  AST 43* 31 60* 39  ALT 34 28 54* 27  ALKPHOS 145* 171* 138* 120  PROT 6.8 6.3 7.2 6.4*  ALBUMIN 4.0 3.9 3.6 3.3*      Assessment and Plan:  73 y.o. male outpatient. History of DM, a fib (on eliquis ), HTN, TIA, GERD, esophageal cancer. Found to have elevated alkaline phosphatase CT Abd Pelvis from 12.19.24 reads Small cystic lesions of the tip of the pancreatic tail measuring no greater than 1.1 cm. No solid component or suspicious contrast enhancement. These are most consistent with small side branch IPMNs and almost certainly benign. Given small size and other known.primary malignancy no specific further follow-up or characterization is required. No focal liver abnormality is seen. Team is requesting liver biopsy for further evaluation for cirrhosis.  PLAN; IR Image Guided Percutaneous Random Liver Biopsy  Risks and benefits of liver biopsy was discussed with the patient and/or patient's family including, but not limited to bleeding, infection, damage to adjacent structures or low yield requiring additional tests.  All of the questions were answered and there is agreement to proceed.  Consent signed and in chart.  Thank you for this interesting consult.  I greatly enjoyed meeting Lawrence [name]. and look forward to participating in their care.  A copy of this report was sent to the requesting provider on this date.  Electronically Signed: Clotilda DELENA Hesselbach, PA-C 09/15/2023, 8:08 AM   I spent a total of 30 Minutes  in face to face in clinical consultation, greater than 50% of which was counseling/coordinating care for cirrhosis.

## 2023-09-12 DIAGNOSIS — H5203 Hypermetropia, bilateral: Secondary | ICD-10-CM | POA: Diagnosis not present

## 2023-09-12 DIAGNOSIS — H40013 Open angle with borderline findings, low risk, bilateral: Secondary | ICD-10-CM | POA: Diagnosis not present

## 2023-09-12 DIAGNOSIS — H52223 Regular astigmatism, bilateral: Secondary | ICD-10-CM | POA: Diagnosis not present

## 2023-09-12 DIAGNOSIS — E113293 Type 2 diabetes mellitus with mild nonproliferative diabetic retinopathy without macular edema, bilateral: Secondary | ICD-10-CM | POA: Diagnosis not present

## 2023-09-12 DIAGNOSIS — H2513 Age-related nuclear cataract, bilateral: Secondary | ICD-10-CM | POA: Diagnosis not present

## 2023-09-12 DIAGNOSIS — H524 Presbyopia: Secondary | ICD-10-CM | POA: Diagnosis not present

## 2023-09-12 DIAGNOSIS — H353132 Nonexudative age-related macular degeneration, bilateral, intermediate dry stage: Secondary | ICD-10-CM | POA: Diagnosis not present

## 2023-09-12 LAB — HM DIABETES EYE EXAM

## 2023-09-14 ENCOUNTER — Other Ambulatory Visit (HOSPITAL_COMMUNITY): Payer: Self-pay | Admitting: Student

## 2023-09-14 DIAGNOSIS — R748 Abnormal levels of other serum enzymes: Secondary | ICD-10-CM

## 2023-09-15 ENCOUNTER — Ambulatory Visit (HOSPITAL_COMMUNITY)
Admission: RE | Admit: 2023-09-15 | Discharge: 2023-09-15 | Disposition: A | Discharge status: Home / Self Care | Payer: Medicare PPO | Source: Ambulatory Visit | Attending: Gastroenterology

## 2023-09-15 ENCOUNTER — Other Ambulatory Visit: Payer: Self-pay

## 2023-09-15 DIAGNOSIS — I10 Essential (primary) hypertension: Secondary | ICD-10-CM | POA: Diagnosis not present

## 2023-09-15 DIAGNOSIS — K219 Gastro-esophageal reflux disease without esophagitis: Secondary | ICD-10-CM | POA: Insufficient documentation

## 2023-09-15 DIAGNOSIS — E119 Type 2 diabetes mellitus without complications: Secondary | ICD-10-CM | POA: Insufficient documentation

## 2023-09-15 DIAGNOSIS — Z8507 Personal history of malignant neoplasm of pancreas: Secondary | ICD-10-CM | POA: Diagnosis not present

## 2023-09-15 DIAGNOSIS — Z8673 Personal history of transient ischemic attack (TIA), and cerebral infarction without residual deficits: Secondary | ICD-10-CM | POA: Insufficient documentation

## 2023-09-15 DIAGNOSIS — R7989 Other specified abnormal findings of blood chemistry: Secondary | ICD-10-CM | POA: Insufficient documentation

## 2023-09-15 DIAGNOSIS — R748 Abnormal levels of other serum enzymes: Secondary | ICD-10-CM

## 2023-09-15 DIAGNOSIS — K7460 Unspecified cirrhosis of liver: Secondary | ICD-10-CM | POA: Diagnosis not present

## 2023-09-15 DIAGNOSIS — Z7901 Long term (current) use of anticoagulants: Secondary | ICD-10-CM | POA: Insufficient documentation

## 2023-09-15 DIAGNOSIS — K7581 Nonalcoholic steatohepatitis (NASH): Secondary | ICD-10-CM | POA: Insufficient documentation

## 2023-09-15 DIAGNOSIS — K7400 Hepatic fibrosis, unspecified: Secondary | ICD-10-CM | POA: Diagnosis not present

## 2023-09-15 LAB — GLUCOSE, CAPILLARY
Glucose-Capillary: 124 mg/dL — ABNORMAL HIGH (ref 70–99)
Glucose-Capillary: 94 mg/dL (ref 70–99)

## 2023-09-15 LAB — CBC
HCT: 39.5 % (ref 39.0–52.0)
Hemoglobin: 13.3 g/dL (ref 13.0–17.0)
MCH: 32.2 pg (ref 26.0–34.0)
MCHC: 33.7 g/dL (ref 30.0–36.0)
MCV: 95.6 fL (ref 80.0–100.0)
Platelets: 174 10*3/uL (ref 150–400)
RBC: 4.13 MIL/uL — ABNORMAL LOW (ref 4.22–5.81)
RDW: 13.4 % (ref 11.5–15.5)
WBC: 7.6 10*3/uL (ref 4.0–10.5)
nRBC: 0 % (ref 0.0–0.2)

## 2023-09-15 LAB — PROTIME-INR
INR: 1.1 (ref 0.8–1.2)
Prothrombin Time: 14.5 s (ref 11.4–15.2)

## 2023-09-15 MED ORDER — MIDAZOLAM HCL 2 MG/2ML IJ SOLN
INTRAMUSCULAR | Status: AC
  Filled 2023-09-15: qty 2

## 2023-09-15 MED ORDER — OXYCODONE HCL 5 MG PO TABS: 10.0000 mg | ORAL_TABLET | ORAL | Status: DC | PRN

## 2023-09-15 MED ORDER — LIDOCAINE HCL (PF) 1 % IJ SOLN
10.0000 mL | Freq: Once | INTRAMUSCULAR | Status: AC
  Administered 2023-09-15: 10 mL via INTRADERMAL

## 2023-09-15 MED ORDER — FENTANYL CITRATE (PF) 100 MCG/2ML IJ SOLN
INTRAMUSCULAR | Status: AC | PRN
  Administered 2023-09-15: 25 ug via INTRAVENOUS
  Administered 2023-09-15: 50 ug via INTRAVENOUS

## 2023-09-15 MED ORDER — MIDAZOLAM HCL 2 MG/2ML IJ SOLN
INTRAMUSCULAR | Status: AC | PRN
  Administered 2023-09-15: 1 mg via INTRAVENOUS

## 2023-09-15 MED ORDER — GELATIN ABSORBABLE 12-7 MM EX MISC
1.0000 | Freq: Once | CUTANEOUS | Status: AC
  Administered 2023-09-15: 1 via TOPICAL

## 2023-09-15 MED ORDER — FENTANYL CITRATE (PF) 100 MCG/2ML IJ SOLN
INTRAMUSCULAR | Status: AC
  Filled 2023-09-15: qty 2

## 2023-09-15 NOTE — Procedures (Signed)
 Vascular and Interventional Radiology Procedure Note  Patient: Lawrence [name]. DOB: [birth year] Medical Record Number: [id number] Note Date/Time: 09/15/23 8:19 AM   Performing Physician: Thom Hall, MD Assistant(s): None  Diagnosis: Abnormal LFTs   Procedure: LIVER BIOPSY, NON-TARGETED  Anesthesia: Conscious Sedation Complications: None Estimated Blood Loss: Minimal Specimens: Sent for Pathology  Findings:  Successful Ultrasound-guided biopsy of liver. A total of 3 samples were obtained. Hemostasis of the tract was achieved using Gelfoam Slurry Embolization.  Plan: Bed rest for 2 hours.  See detailed procedure note with images in PACS. The patient tolerated the procedure well without incident or complication and was returned to Recovery in stable condition.    Thom Hall, MD [facility] Radiology   Pager. [phone] Clinic. [phone]

## 2023-09-18 LAB — SURGICAL PATHOLOGY

## 2023-09-21 ENCOUNTER — Telehealth: Payer: Self-pay | Admitting: Internal Medicine

## 2023-09-21 NOTE — Telephone Encounter (Signed)
Requesting: tramadol 50mg  Contract: 08/21/23 UDS: 08/21/23 Last Visit: 08/21/23 Next Visit: 01/22/24 Last Refill: 08/25/23 #30 and 0RF   Please Advise

## 2023-09-22 MED ORDER — TRAMADOL HCL 50 MG PO TABS: 50.0000 mg | ORAL_TABLET | Freq: Every day | ORAL | 0 refills | Status: DC | PRN

## 2023-09-22 NOTE — Telephone Encounter (Signed)
PDMP okay, Rx sent 

## 2023-09-27 DIAGNOSIS — E1165 Type 2 diabetes mellitus with hyperglycemia: Secondary | ICD-10-CM | POA: Diagnosis not present

## 2023-10-04 DIAGNOSIS — Z8711 Personal history of peptic ulcer disease: Secondary | ICD-10-CM | POA: Diagnosis not present

## 2023-10-04 DIAGNOSIS — K862 Cyst of pancreas: Secondary | ICD-10-CM | POA: Diagnosis not present

## 2023-10-04 DIAGNOSIS — Z8501 Personal history of malignant neoplasm of esophagus: Secondary | ICD-10-CM | POA: Diagnosis not present

## 2023-10-04 DIAGNOSIS — R945 Abnormal results of liver function studies: Secondary | ICD-10-CM | POA: Diagnosis not present

## 2023-10-04 DIAGNOSIS — Z8679 Personal history of other diseases of the circulatory system: Secondary | ICD-10-CM | POA: Diagnosis not present

## 2023-10-16 ENCOUNTER — Other Ambulatory Visit: Payer: Self-pay | Admitting: Internal Medicine

## 2023-10-16 NOTE — Telephone Encounter (Addendum)
 PDMP reviewed: - Too early for tramadol . - Refill lorazepam 

## 2023-10-16 NOTE — Telephone Encounter (Signed)
 Requesting: tramadol  and lorazepam   Contract: 08/21/23 UDS: 08/21/23 Last Visit: 08/21/23 Next Visit: 01/22/24 Last Refill: see med list   Please Advise

## 2023-10-17 ENCOUNTER — Other Ambulatory Visit: Payer: Self-pay | Admitting: Internal Medicine

## 2023-10-23 ENCOUNTER — Telehealth: Payer: Self-pay | Admitting: Internal Medicine

## 2023-10-23 NOTE — Telephone Encounter (Signed)
Requesting: tramadol 50mg  Contract: 08/21/23 UDS: 08/21/23 Last Visit: 08/21/23 Next Visit: 01/22/24 Last Refill: 09/21/23 #30 and 0RF    Please Advise

## 2023-10-24 NOTE — Telephone Encounter (Signed)
 PDMP okay, Rx sent

## 2023-10-27 DIAGNOSIS — E1165 Type 2 diabetes mellitus with hyperglycemia: Secondary | ICD-10-CM | POA: Diagnosis not present

## 2023-10-30 ENCOUNTER — Other Ambulatory Visit (HOSPITAL_BASED_OUTPATIENT_CLINIC_OR_DEPARTMENT_OTHER): Payer: Self-pay

## 2023-10-31 ENCOUNTER — Other Ambulatory Visit (HOSPITAL_BASED_OUTPATIENT_CLINIC_OR_DEPARTMENT_OTHER): Payer: Self-pay

## 2023-11-01 ENCOUNTER — Other Ambulatory Visit: Payer: Self-pay | Admitting: Internal Medicine

## 2023-11-06 ENCOUNTER — Ambulatory Visit: Payer: Medicare PPO | Admitting: Internal Medicine

## 2023-11-06 VITALS — BP 130/84 | HR 81 | Temp 98.1°F | Resp 18 | Ht 73.0 in | Wt 229.1 lb

## 2023-11-06 DIAGNOSIS — J40 Bronchitis, not specified as acute or chronic: Secondary | ICD-10-CM

## 2023-11-06 DIAGNOSIS — J9801 Acute bronchospasm: Secondary | ICD-10-CM | POA: Diagnosis not present

## 2023-11-06 MED ORDER — AIRSUPRA 90-80 MCG/ACT IN AERO: 2.0000 | INHALATION_SPRAY | Freq: Four times a day (QID) | RESPIRATORY_TRACT | 2 refills | Status: DC | PRN

## 2023-11-06 MED ORDER — AZITHROMYCIN 250 MG PO TABS: ORAL_TABLET | ORAL | 0 refills | Status: DC

## 2023-11-06 MED ORDER — PREDNISONE 10 MG PO TABS: ORAL_TABLET | ORAL | 0 refills | Status: DC

## 2023-11-06 MED ORDER — ALBUTEROL SULFATE HFA 108 (90 BASE) MCG/ACT IN AERS: 2.0000 | INHALATION_SPRAY | Freq: Four times a day (QID) | RESPIRATORY_TRACT | 2 refills | Status: DC | PRN

## 2023-11-06 NOTE — Progress Notes (Unsigned)
 Subjective:    Patient ID: Lawrence Forth., male    DOB: November 13, 1950, 73 y.o.   MRN: [id number]  DOS:  11/06/2023 Type of visit - description: Acute, cough  Symptoms started 3 weeks ago: Chest congestion, cough. The cough is at times intense, on and off throughout the day. Sputum is clear. Thinks he got a bug from the grandchildren.  Denies fever or chills. Never had sinus congestion. No chest pain or difficulty breathing. + Wheezing, uses albuterol with some relief. History of GERD, intolerant to medicines.  Still has occasional symptoms.  Review of Systems See above   Past Medical History:  Diagnosis Date   Anxiety 01/20/2023   Bradycardia    Depression    GERD (gastroesophageal reflux disease)    H/O hiatal hernia    s/p  repair 06-26-2012   History of esophagectomy 08-21-2012  AT [facility]   History of kidney stones 2006   History of malignant neoplasm of esophagus ONCOLOGIST-  DR D'AMICO AT [facility]-- PER LAST NOTE 01/ 2018  NO RECURRENCE   dx 10/ 2013  Stage 2A (T2 N0)  08-21-2012 s/p  esophagectomy w/ gastric pull-through [facility]) post residual recurrent anastomotic stricture (multiple EGD w/ dilations)   History of transient ischemic attack (TIA) 2003   no residual   Hypertension    Long term (current) use of anticoagulants    xarelto   On dofetilide therapy    PAF (paroxysmal atrial fibrillation) (HCC) first dx 2009   primary cardiologist-  dr Verdis Prime /  EP cardiologist -- dr Alden Hipp ([facility])  s/p  afib ablation 08-02-2016   Pancreatic cyst    x2    Peyronie's disease    Presence of permanent cardiac pacemaker implanted 05-01-2014  at [facility]   Dr.Daubert -[facility] follows   Pulmonary nodule, right    upper and middle lobes --  per last CT stable   Type 2 diabetes mellitus treated with insulin (HCC)    Wears glasses     Past Surgical History:  Procedure Laterality Date   ANTERIOR CERVICAL DECOMP/DISCECTOMY FUSION N/A 06/02/2016   Procedure:  ANTERIOR CERVICAL DECOMPRESSION FUSION CERVICAL 5-6, CERVICAL 6-7 WITH INSTRUMENTATION AND ALLOGRAFT;  Surgeon: Estill Bamberg, MD;  Location: [facility] OR;  Service: Orthopedics;  Laterality: N/A;  ANTERIOR CERVICAL DECOMPRESSION FUSION CERVICAL 5-6, CERVICAL 6-7 WITH INSTRUMENTATION AND ALLOGRAFT   BIOPSY  06/26/2012   Procedure: BIOPSY;  Surgeon: Lodema Pilot, DO;  Location: [facility] ORS;  Service: General;;   CARDIAC ELECTROPHYSIOLOGY STUDY AND ABLATION  08-02-2016   [facility]   (dr Alden Hipp)   pulmonary veins isolation and ablation afib   CARDIAC PACEMAKER PLACEMENT  05-01-2014   [facility]   CHOLECYSTECTOMY  1983   COLONOSCOPY WITH PROPOFOL N/A 07/13/2015   Procedure: COLONOSCOPY WITH PROPOFOL;  Surgeon: Charolett Bumpers, MD;  Location: [facility];  Service: Endoscopy;  Laterality: N/A;   ESOPHAGECTOMY  08-21-2012   [facility]   w/ gastric pull-through   ESOPHAGOGASTRODUODENOSCOPY (EGD) WITH ESOPHAGEAL DILATION  multiple x32 per pt approx.--- last one 02-09-2015   recurrent anastomotic stricture post esophagectomy   HIATAL HERNIA REPAIR  06/26/2012   Procedure: LAPAROSCOPIC REPAIR OF HIATAL HERNIA;  Surgeon: Lodema Pilot, DO;  Location: [facility];  Service: General;;   INCISIONAL HERNIA REPAIR  06/26/2012   Procedure: HERNIA REPAIR INCISIONAL;  Surgeon: Lodema Pilot, DO;  Location: [facility];  Service: General;;   INGUINAL HERNIA REPAIR Bilateral 1982   JEJUNOSTOMY FEEDING TUBE  12/22/2012   Removed  12/ 2016  approx.   KNEE ARTHROSCOPY Left 1990   KNEE ARTHROSCOPY Right 12/2019   LAPAROSCOPIC GASTRIC SLEEVE RESECTION  06/2012   started but abandoned d/t discovery of tumor   LAPAROTOMY  07/12/2021   revision of previous gastric conduit, hiatal hernia repair   MASS EXCISION Left 04/04/2013   Procedure: EXCISION OF SCALP MASS;  Surgeon: Lodema Pilot, DO;  Location: [facility];  Service: General;  Laterality: Left;   pilomatrixoma   NESBIT PROCEDURE N/A 12/09/2016   Procedure: NESBIT PROCEDURE 16 DOT PLICATION;  Surgeon:  Ihor Gully, MD;  Location: [facility];  Service: Urology;  Laterality: N/A;   THORACOTOMY  07/12/2021   revision of previous gastric conduit   TONSILLECTOMY  1959   TRANSTHORACIC ECHOCARDIOGRAM  08-06-2015   [facility]   mild LVH, ef 50%/  mild RAE /  moderate LAE/  trivial AR, MR, PR,  and TR    Current Outpatient Medications  Medication Instructions   albuterol (VENTOLIN HFA) 108 (90 Base) MCG/ACT inhaler 2 puffs, Inhalation, Every 6 hours PRN   Albuterol-Budesonide (AIRSUPRA) 90-80 MCG/ACT AERO 2 puffs, Inhalation, 4 times daily PRN   Alcohol Swabs ([employer] PREP) 70 % PADS See admin instructions   apixaban (ELIQUIS) 5 mg, Oral, 2 times daily   azithromycin (ZITHROMAX Z-PAK) 250 MG tablet 2 tabs a day the first day, then 1 tab a day x 4 days   Cyanocobalamin (VITAMIN B 12 PO) Take by mouth.   ferrous sulfate 325 mg, Daily   FLUoxetine (PROZAC) 60 mg, Oral, Daily   HUMULIN 70/30 (70-30) 100 UNIT/ML injection 16 UNITS IN THE MORNING WITH BREAKFAST AND 12 UNITS IN THE EVENING WITH SUPPER.   Insulin Syringe-Needle U-100 (BD VEO INSULIN SYRINGE U/F) 31G X 15/64" 0.3 ML MISC Use as directed in the morning, at noon, in the evening, and at bedtime.   LORazepam (ATIVAN) 1 MG tablet TAKE 1 TABLET BY MOUTH 2 TIMES DAILY AS NEEDED FOR ANXIETY OR SLEEP.   metoprolol tartrate (LOPRESSOR) 25 mg, 2 times daily   Multiple Vitamins-Minerals (PRESERVISION AREDS PO) 1 tablet, 2 times daily   predniSONE (DELTASONE) 10 MG tablet 2 tabs a day x 5 days   rosuvastatin (CRESTOR) 40 mg, Daily   traMADol (ULTRAM) 50 mg, Oral, Daily PRN       Objective:   Physical Exam BP 130/84   Pulse 81   Temp 98.1 F (36.7 C) (Oral)   Resp 18   Ht 6\' 1"  (1.854 m)   Wt 229 lb 2 oz (103.9 kg)   SpO2 97%   BMI 30.23 kg/m  General:   Well developed, NAD, BMI noted. HEENT:  Normocephalic . Face symmetric, atraumatic Lungs:  Abundant rhonchi bilaterally, better after coughing.  Mild end expiratory  quizzing. Normal respiratory effort, no intercostal retractions, no accessory muscle use. Heart: RRR,  no murmur.  Lower extremities: no pretibial edema bilaterally  Skin: Not pale. Not jaundice Neurologic:  alert & oriented X3.  Speech normal, gait appropriate for age and unassisted Psych--  Cognition and judgment appear intact.  Cooperative with normal attention span and concentration.  Behavior appropriate. No anxious or depressed appearing.      Assessment      Assessment (new patient, previous PCP retired) DM- per endo Mild nonproliferative diabetic retinopathy with macular edema. HTN High cholesterol Anxiety  GI: Esophageal neoplasm 2013 found during a gastric bypass  Hiatal hernia: R thoracotomy Laparotomy lysis of adhesions for repair of paraesophageal hernia in  2022  Cardiovascular: A-fib dx 2014, anticoagulated Pacemaker 2015, Ablation @ [facility] 2017 TIA 2003 ?  MRI negative, MRA some atherosclerosis distal R vertebral artery? Orthostatic hypotension CAD: Moderate-per CT coronary angiogram 01/2023, medical mngmt [facility]: see surgeries, on tramadol   PLAN Bronchitis, bronchospasm: The patient has no history of asthma but has been coughing for 4 weeks, has had to year only inhaler that was previously prescribed and notes that albuterol brings some relief. Plan: Zithromax, round of prednisone, continue albuterol or Airsupra (if he can afford it) as needed.  Mucinex DM.  Call if not gradually better, see AVS. Increased AP: Patient reports he had a liver biopsy, apparently that did not give him a clear diagnosis so he will see a liver specialist at [facility] soon.

## 2023-11-06 NOTE — Patient Instructions (Addendum)
   For cough:  Take Mucinex DM or Robitussin-DM OTC.  Follow the instructions in the box.  Take  Zithromax, an antibiotic, for 5 days.  Take prednisone for 5 days, follow-up instructions in the bottle.  For wheezing and persistent cough: Use either albuterol or Airsupra.  Avoid decongestants such as  Pseudoephedrine or phenylephrine      Call if not gradually better over the next  10 days   Call anytime if the symptoms are severe, you have high fever, short of breath, chest pain

## 2023-11-07 ENCOUNTER — Encounter: Payer: Self-pay | Admitting: Endocrinology

## 2023-11-07 ENCOUNTER — Ambulatory Visit: Payer: Medicare PPO | Admitting: Endocrinology

## 2023-11-07 VITALS — BP 134/82 | HR 82 | Ht 73.0 in | Wt 229.0 lb

## 2023-11-07 DIAGNOSIS — E1142 Type 2 diabetes mellitus with diabetic polyneuropathy: Secondary | ICD-10-CM

## 2023-11-07 DIAGNOSIS — Z794 Long term (current) use of insulin: Secondary | ICD-10-CM

## 2023-11-07 LAB — POCT GLYCOSYLATED HEMOGLOBIN (HGB A1C): Hemoglobin A1C: 6.8 % — AB (ref 4.0–5.6)

## 2023-11-07 NOTE — Patient Instructions (Addendum)
 Latest Reference Range & Units 02/08/23 12:25 08/08/23 08:12 11/07/23 09:17  Hemoglobin A1C 4.0 - 5.6 % 7.3 (H) 6.8 ! Pend 6.8 !  (H): Data is abnormally high !: Data is abnormal

## 2023-11-07 NOTE — Assessment & Plan Note (Signed)
 Bronchitis, bronchospasm: The patient has no history of asthma but has been coughing for 4 weeks, has had to year only inhaler that was previously prescribed and notes that albuterol brings some relief. Plan: Zithromax, round of prednisone, continue albuterol or Airsupra (if he can afford it) as needed.  Mucinex DM.  Call if not gradually better, see AVS. Increased AP: Patient reports he had a liver biopsy, apparently that did not give him a clear diagnosis so he will see a liver specialist at [facility] soon.

## 2023-11-07 NOTE — Progress Notes (Signed)
 Outpatient Endocrinology Note [place] Salahuddin Arismendez, MD  11/07/23  Patient's Name: Lawrence [name].    DOB: [birth year]    MRN: [id number]                                                    REASON OF VISIT: Follow-up of type 2 diabetes mellitus   REFERRING PROVIDER: Wanda Plump, MD  PCP: Wanda Plump, MD  HISTORY OF PRESENT ILLNESS:   Bartlett Enke. is a 73 y.o. old male with past medical history listed below, is here for evaluation of  type 2 diabetes mellitus.   Pertinent Diabetes History: Patient was diagnosed with type 2 diabetes mellitus in 2013 at that time hemoglobin A1c was 8.6%.  From 2022 he was following with [facility] for the diabetes care, he was hospitalized postoperatively at that time for surgical intervention for post esophagectomy anastomotic leak, he was also doing telemedicine visits as well, he was last time seen in March 2024. He wants to transfer over his diabetes care in this clinic, was initially seen in August 2024.  Patient has history of stage II esophageal adenocarcinoma status post exploratory laparotomy for transhiatal esophagogastrectomy in 2013.  Chronic Diabetes Complications : Retinopathy: yes. Following with ophthalmology / retinal specialist, every 6 months.  Nephropathy: no Peripheral neuropathy: yes, numbness and tingling + Coronary artery disease: no Stroke: no  Relevant comorbidities and cardiovascular risk factors: Obesity: yes Body mass index is 30.21 kg/m.  Hypertension: yes Hyperlipidemia. Yes, on statin   Current / Home Diabetic regimen includes:  Humulin 70/30 insulin 20 units with breakfast and 8 units with supper.  Prior diabetic medications: Metformin, stopped due to diarrhea.   Glycemic data:    CONTINUOUS GLUCOSE MONITORING SYSTEM (CGMS) INTERPRETATION: At today's visit, we reviewed CGM downloads. The full report is scanned in the media. Reviewing the CGM trends, blood glucose are as follows:  FreeStyle Libre 2  CGM-  Sensor Download (Sensor download was reviewed and summarized below.) Dates: February 19 to November 07, 2023, 14 days Sensor Average: 165 Glucose Management Indicator: 7.3%  % data captured: 59%    Interpretation: Mostly acceptable blood sugar with random hyperglycemia with blood sugar up to 250-270 range related to meals and a snack.  No hypoglycemia.  Rarely blood sugar up to 300s related with sugary snack.  Hypoglycemia: Patient has no hypoglycemic episodes. Patient has hypoglycemia awareness.  Factors modifying glucose control: 1.  Diabetic diet assessment: He generally eats a small frequent meals.  He has habits of drinking wine daily and cutting down.  2.  Staying active or exercising:   3.  Medication compliance: compliant all of the time.  # Elevated alkaline phosphatase likely related to liver disorder:  -Patient has elevated alkaline phosphatase since August 2023.  Previously he had normal alkaline phosphatase.. Bone scan showed overall increased tracer uptake throughout the axial and appendicular skeleton raising question of metabolic bone disease.  Alkaline phosphatase 138 to 171 with upper normal limit of 126.  Mildly elevated liver enzyme AST and ALT.  Elevated GGT, 1 36-141 with upper normal limit of 65. -He has normal serum calcium.  Normal vitamin D level.  Normal PSA. -Patient has been drinking alcohol significantly mainly wine almost every day. -Patient has been following with gastroenterology at [facility] gastroenterology.  Interval history  CGM data as reviewed above.  Mostly acceptable blood sugar.  No more hypoglycemia.  Hemoglobin A1c today 6.8%.  He has occasional numbness and tingling of the feet otherwise no complaints today.  REVIEW OF SYSTEMS As per history of present illness.   PAST MEDICAL HISTORY: Past Medical History:  Diagnosis Date   Anxiety 01/20/2023   Bradycardia    Depression    GERD (gastroesophageal reflux disease)    H/O hiatal hernia     s/p  repair 06-26-2012   History of esophagectomy 08-21-2012  AT [facility]   History of kidney stones 2006   History of malignant neoplasm of esophagus ONCOLOGIST-  DR D'AMICO AT [facility]-- PER LAST NOTE 01/ 2018  NO RECURRENCE   dx 10/ 2013  Stage 2A (T2 N0)  08-21-2012 s/p  esophagectomy w/ gastric pull-through [facility]) post residual recurrent anastomotic stricture (multiple EGD w/ dilations)   History of transient ischemic attack (TIA) 2003   no residual   Hypertension    Long term (current) use of anticoagulants    xarelto   On dofetilide therapy    PAF (paroxysmal atrial fibrillation) (HCC) first dx 2009   primary cardiologist-  dr Verdis Prime /  EP cardiologist -- dr Alden Hipp ([facility])  s/p  afib ablation 08-02-2016   Pancreatic cyst    x2    Peyronie's disease    Presence of permanent cardiac pacemaker implanted 05-01-2014  at [facility]   Dr.Daubert -[facility] follows   Pulmonary nodule, right    upper and middle lobes --  per last CT stable   Type 2 diabetes mellitus treated with insulin (HCC)    Wears glasses     PAST SURGICAL HISTORY: Past Surgical History:  Procedure Laterality Date   ANTERIOR CERVICAL DECOMP/DISCECTOMY FUSION N/A 06/02/2016   Procedure: ANTERIOR CERVICAL DECOMPRESSION FUSION CERVICAL 5-6, CERVICAL 6-7 WITH INSTRUMENTATION AND ALLOGRAFT;  Surgeon: Estill Bamberg, MD;  Location: [facility] OR;  Service: Orthopedics;  Laterality: N/A;  ANTERIOR CERVICAL DECOMPRESSION FUSION CERVICAL 5-6, CERVICAL 6-7 WITH INSTRUMENTATION AND ALLOGRAFT   BIOPSY  06/26/2012   Procedure: BIOPSY;  Surgeon: Lodema Pilot, DO;  Location: [facility] ORS;  Service: General;;   CARDIAC ELECTROPHYSIOLOGY STUDY AND ABLATION  08-02-2016   [facility]   (dr Alden Hipp)   pulmonary veins isolation and ablation afib   CARDIAC PACEMAKER PLACEMENT  05-01-2014   [facility]   CHOLECYSTECTOMY  1983   COLONOSCOPY WITH PROPOFOL N/A 07/13/2015   Procedure: COLONOSCOPY WITH PROPOFOL;  Surgeon: Charolett Bumpers, MD;  Location: [facility]  ENDOSCOPY;  Service: Endoscopy;  Laterality: N/A;   ESOPHAGECTOMY  08-21-2012   [facility]   w/ gastric pull-through   ESOPHAGOGASTRODUODENOSCOPY (EGD) WITH ESOPHAGEAL DILATION  multiple x32 per pt approx.--- last one 02-09-2015   recurrent anastomotic stricture post esophagectomy   HIATAL HERNIA REPAIR  06/26/2012   Procedure: LAPAROSCOPIC REPAIR OF HIATAL HERNIA;  Surgeon: Lodema Pilot, DO;  Location: [facility] ORS;  Service: General;;   INCISIONAL HERNIA REPAIR  06/26/2012   Procedure: HERNIA REPAIR INCISIONAL;  Surgeon: Lodema Pilot, DO;  Location: [facility] ORS;  Service: General;;   INGUINAL HERNIA REPAIR Bilateral 1982   JEJUNOSTOMY FEEDING TUBE  12/22/2012   Removed 12/ 2016  approx.   KNEE ARTHROSCOPY Left 1990   KNEE ARTHROSCOPY Right 12/2019   LAPAROSCOPIC GASTRIC SLEEVE RESECTION  06/2012   started but abandoned d/t discovery of tumor   LAPAROTOMY  07/12/2021   revision of previous gastric conduit, hiatal hernia repair   MASS EXCISION Left 04/04/2013  Procedure: EXCISION OF SCALP MASS;  Surgeon: Lodema Pilot, DO;  Location: [facility] ORS;  Service: General;  Laterality: Left;   pilomatrixoma   NESBIT PROCEDURE N/A 12/09/2016   Procedure: NESBIT PROCEDURE 16 DOT PLICATION;  Surgeon: Ihor Gully, MD;  Location[facility];  Service: Urology;  Laterality: N/A;   THORACOTOMY  07/12/2021   revision of previous gastric conduit   TONSILLECTOMY  1959   TRANSTHORACIC ECHOCARDIOGRAM  08-06-2015   [facility]   mild LVH, ef 50%/  mild RAE /  moderate LAE/  trivial AR, MR, PR,  and TR    ALLERGIES: Allergies  Allergen Reactions   Chlorthalidone Other (See Comments)    urinary urgency   Lisinopril Other (See Comments)    cough   Metformin Hcl Other (See Comments)    fecal urgency    FAMILY HISTORY:  Family History  Problem Relation Age of Onset   Hypertension Mother    Heart failure Mother    Prostate cancer Father    Kidney cancer Father    Emphysema Father    Aortic aneurysm Father     Anorexia nervosa Sister    Depression Sister    Anorexia nervosa Sister    Heart disease Sister    Hypertension Brother    Cancer Maternal Grandmother    Heart attack Maternal Grandfather    Heart disease Maternal Grandfather    Cancer Paternal Grandmother    Alcohol abuse Paternal Grandfather    Early death Paternal Grandfather    Tuberculosis Paternal Grandfather    Colon cancer Neg Hx     SOCIAL HISTORY: Social History   Socioeconomic History   Marital status: Married    Spouse name: Not on file   Number of children: 4   Years of education: Not on file   Highest education level: Bachelor's degree (e.g., BA, AB, BS)  Occupational History   Occupation: retired, Medical illustrator, Designer, fashion/clothing  Tobacco Use   Smoking status: Never   Smokeless tobacco: Never  Vaping Use   Vaping status: Never Used  Substance and Sexual Activity   Alcohol use: Yes    Comment: socially   Drug use: Never   Sexual activity: Not on file  Other Topics Concern   Not on file  Social History Narrative   Household- lives w/ wife   Social Drivers of Corporate investment banker Strain: Low Risk  (11/06/2023)   Overall Financial Resource Strain (CARDIA)    Difficulty of Paying Living Expenses: Not very hard  Food Insecurity: No Food Insecurity (11/06/2023)   Hunger Vital Sign    Worried About Running Out of Food in the Last Year: Never true    Ran Out of Food in the Last Year: Never true  Transportation Needs: No Transportation Needs (11/06/2023)   PRAPARE - Administrator, Civil Service (Medical): No    Lack of Transportation (Non-Medical): No  Physical Activity: Inactive (11/06/2023)   Exercise Vital Sign    Days of Exercise per Week: 0 days    Minutes of Exercise per Session: 30 min  Stress: Stress Concern Present (11/06/2023)   [employer] of Occupational Health - Occupational Stress Questionnaire    Feeling of Stress : Very much  Social Connections: Unknown (11/06/2023)   Social  Connection and Isolation Panel [NHANES]    Frequency of Communication with Friends and Family: Once a week    Frequency of Social Gatherings with Friends and Family: Once a week    Attends  Religious Services: Patient declined    Active Member of Clubs or Organizations: No    Attends Banker Meetings: Never    Marital Status: Married    MEDICATIONS:  Current Outpatient Medications  Medication Sig Dispense Refill   albuterol (VENTOLIN HFA) 108 (90 Base) MCG/ACT inhaler Inhale 2 puffs into the lungs every 6 (six) hours as needed for wheezing or shortness of breath. 8 g 2   Albuterol-Budesonide (AIRSUPRA) 90-80 MCG/ACT AERO Inhale 2 puffs into the lungs 4 (four) times daily as needed. 1 g 2   Alcohol Swabs ([employer] PREP) 70 % PADS USE AS DIRECTED 200 each 2   apixaban (ELIQUIS) 5 MG TABS tablet Take 1 tablet (5 mg total) by mouth 2 (two) times daily. 180 tablet 1   azithromycin (ZITHROMAX Z-PAK) 250 MG tablet 2 tabs a day the first day, then 1 tab a day x 4 days 6 tablet 0   Cyanocobalamin (VITAMIN B 12 PO) Take by mouth.     ferrous sulfate 325 (65 FE) MG tablet Take 325 mg by mouth daily.     FLUoxetine (PROZAC) 20 MG tablet Take 3 tablets (60 mg total) by mouth daily. 270 tablet 1   HUMULIN 70/30 (70-30) 100 UNIT/ML injection 16 UNITS IN THE MORNING WITH BREAKFAST AND 12 UNITS IN THE EVENING WITH SUPPER. (Patient taking differently: 20 UNITS IN THE MORNING WITH BREAKFAST AND 8 UNITS IN THE EVENING WITH SUPPER.) 30 mL 1   Insulin Syringe-Needle U-100 (BD VEO INSULIN SYRINGE U/F) 31G X 15/64" 0.3 ML MISC Use as directed in the morning, at noon, in the evening, and at bedtime. 300 each 3   LORazepam (ATIVAN) 1 MG tablet TAKE 1 TABLET BY MOUTH 2 TIMES DAILY AS NEEDED FOR ANXIETY OR SLEEP. 60 tablet 1   metoprolol tartrate (LOPRESSOR) 25 MG tablet Take 25 mg by mouth 2 (two) times daily.     Multiple Vitamins-Minerals (PRESERVISION AREDS PO) Take 1 tablet by mouth in the morning and at  bedtime. Preservision AREDS     predniSONE (DELTASONE) 10 MG tablet 2 tabs a day x 5 days 10 tablet 0   rosuvastatin (CRESTOR) 40 MG tablet Take 40 mg by mouth daily.     traMADol (ULTRAM) 50 MG tablet Take 1 tablet (50 mg total) by mouth daily as needed for moderate pain (pain score 4-6). 30 tablet 0   No current facility-administered medications for this visit.    PHYSICAL EXAM: Vitals:   11/07/23 0857  BP: 134/82  Pulse: 82  SpO2: 99%  Weight: 229 lb (103.9 kg)  Height: 6\' 1"  (1.854 m)    Body mass index is 30.21 kg/m.  Wt Readings from Last 3 Encounters:  11/07/23 229 lb (103.9 kg)  11/06/23 229 lb 2 oz (103.9 kg)  09/15/23 235 lb (106.6 kg)    General: Well developed, well nourished male in no apparent distress.  HEENT: AT/[place], no external lesions.  Eyes: Conjunctiva clear and no icterus. Neck: Neck supple  Lungs: Respirations not labored Neurologic: Alert, oriented, normal speech Extremities / Skin: Dry.  Psychiatric: Does not appear depressed or anxious  Diabetic Foot Exam - Simple   No data filed    LABS Reviewed Lab Results  Component Value Date   HGBA1C 6.8 (A) 11/07/2023   HGBA1C 6.8 (A) 08/08/2023   HGBA1C 7.3 (H) 02/08/2023   No results found for: "FRUCTOSAMINE" Lab Results  Component Value Date   CHOL 118 01/05/2023   HDL 42 01/05/2023  LDLCALC 44 01/05/2023   TRIG 197 (H) 01/05/2023   CHOLHDL 2.8 01/05/2023   Lab Results  Component Value Date   MICRALBCREAT 12.2 04/07/2023   Lab Results  Component Value Date   CREATININE 0.95 06/18/2023   No results found for: "GFR"  ASSESSMENT / PLAN  1. Controlled type 2 diabetes mellitus with diabetic polyneuropathy, with long-term current use of insulin (HCC)     Diabetes Mellitus type 2, complicated by diabetic retinopathy and neuropathy. - Diabetic status / severity: Fair control.  Lab Results  Component Value Date   HGBA1C 6.8 (A) 11/07/2023    - Hemoglobin A1c goal : <7%  Random  hyperglycemia otherwise mostly acceptable blood sugar.  No more hypoglycemia.  Advised to avoid sugary snacks and portion control with meals.  No change in the insulin regimen.  - Medications: No change.  I) Humulin 70/30 :  20 units in the morning with breakfast and  8 units in the evening with supper.    - will likely avoid GLP-1 receptor agonist, history of gastric/esophageal surgery and upper GI symptoms.  - Home glucose testing: CGM/freestyle libre and check as needed. - Discussed/ Gave Hypoglycemia treatment plan.  # Consult : not required at this time.   # Annual urine for microalbuminuria/ creatinine ratio, no microalbuminuria currently.  Last  Lab Results  Component Value Date   MICRALBCREAT 12.2 04/07/2023    # Foot check nightly / neuropathy.  Advised to follow-up with podiatry.  # He has diabetic retinopathy, following with ophthalmology every 6 months.  - Diet: Eat reasonable portion sizes to promote a healthy weight - Life style / activity / exercise: Discussed.  2. Blood pressure  -  BP Readings from Last 1 Encounters:  11/07/23 134/82    - Control is in target.  - No change in current plans.  3. Lipid status / Hyperlipidemia - Last  Lab Results  Component Value Date   LDLCALC 44 01/05/2023   - Continue rosuvastatin 40 mg daily.   Westley "Roe Coombs" was seen today for follow-up.  Diagnoses and all orders for this visit:  Controlled type 2 diabetes mellitus with diabetic polyneuropathy, with long-term current use of insulin (HCC) -     POCT glycosylated hemoglobin (Hb A1C)   DISPOSITION Follow up in clinic in 3  months suggested.   All questions answered and patient verbalized understanding of the plan.  [place] Ximenna Fonseca, MD [facility] [address] [place], [address] [place], [place] [address] Phone # [phone]  At least part of this note was generated using voice recognition software. Inadvertent word errors may have  occurred, which were not recognized during the proofreading process.

## 2023-11-08 ENCOUNTER — Encounter: Payer: Self-pay | Admitting: Endocrinology

## 2023-11-08 DIAGNOSIS — K760 Fatty (change of) liver, not elsewhere classified: Secondary | ICD-10-CM | POA: Diagnosis not present

## 2023-11-08 DIAGNOSIS — F1090 Alcohol use, unspecified, uncomplicated: Secondary | ICD-10-CM | POA: Diagnosis not present

## 2023-11-13 ENCOUNTER — Other Ambulatory Visit (HOSPITAL_COMMUNITY): Payer: Self-pay

## 2023-11-20 ENCOUNTER — Other Ambulatory Visit (HOSPITAL_COMMUNITY): Payer: Self-pay

## 2023-11-20 DIAGNOSIS — H35033 Hypertensive retinopathy, bilateral: Secondary | ICD-10-CM | POA: Diagnosis not present

## 2023-11-20 DIAGNOSIS — H353132 Nonexudative age-related macular degeneration, bilateral, intermediate dry stage: Secondary | ICD-10-CM | POA: Diagnosis not present

## 2023-11-20 DIAGNOSIS — E113293 Type 2 diabetes mellitus with mild nonproliferative diabetic retinopathy without macular edema, bilateral: Secondary | ICD-10-CM | POA: Diagnosis not present

## 2023-11-20 DIAGNOSIS — H43823 Vitreomacular adhesion, bilateral: Secondary | ICD-10-CM | POA: Diagnosis not present

## 2023-11-21 ENCOUNTER — Ambulatory Visit: Admitting: Internal Medicine

## 2023-11-21 ENCOUNTER — Encounter: Payer: Self-pay | Admitting: Internal Medicine

## 2023-11-21 VITALS — BP 138/80 | HR 95 | Temp 98.2°F | Resp 18 | Ht 73.0 in | Wt 231.1 lb

## 2023-11-21 DIAGNOSIS — J40 Bronchitis, not specified as acute or chronic: Secondary | ICD-10-CM

## 2023-11-21 DIAGNOSIS — J9801 Acute bronchospasm: Secondary | ICD-10-CM

## 2023-11-21 MED ORDER — BUDESONIDE-FORMOTEROL FUMARATE 160-4.5 MCG/ACT IN AERO: 2.0000 | INHALATION_SPRAY | Freq: Two times a day (BID) | RESPIRATORY_TRACT | 3 refills | Status: DC

## 2023-11-21 MED ORDER — HYDROCOD POLI-CHLORPHE POLI ER 10-8 MG/5ML PO SUER: 5.0000 mL | Freq: Two times a day (BID) | ORAL | 0 refills | Status: DC | PRN

## 2023-11-21 MED ORDER — HYDROCODONE BIT-HOMATROP MBR 5-1.5 MG/5ML PO SOLN: 5.0000 mL | Freq: Two times a day (BID) | ORAL | 0 refills | Status: DC | PRN

## 2023-11-21 NOTE — Patient Instructions (Signed)
 Symbicort 2 puffs twice daily until better  Albuterol as needed for chest congestion, wheezing or cough.  Continue Mucinex or Mucinex DM You can also take hydrocodone, stronger cough suppressant twice a day if needed.  Watch for drowsiness.  Do not mix with tramadol.  If you are not gradually better in the next 2 weeks let me know   How to Use a Metered Dose Inhaler  A metered dose inhaler (MDI) is a handheld device for taking medicine that must be breathed into the lungs (inhaled). The medicine comes in a canister that delivers a spray (puff) of medicine. Each canister holds a certain number of doses. A spacer (holding chamber) may be used to get more medicine into your lungs. What are the risks? If the medicine in the MDI is a steroid, it can cause mouth sores. To prevent this, rinse your mouth after you use the MDI. Gargle and spit out the water. Do not swallow the water. If you do not use the MDI in the right way, the medicine may not reach your lungs. The medicine in the MDI may cause side effects. Read the package insert for the medicine to learn more. Ask your health care provider or pharmacist if you have questions. If you do not have enough strength to push down the canister to make it spray, ask your provider for ways to help. How to use an MDI without a spacer  Remove the cap from the MDI. If you are using the MDI for the first time, prepare (prime) it for use. Read the instructions for your inhaler or ask your provider about the number of puffs needed to prime your MDI. To prime your inhaler, shake it for 5 seconds, turn it away from your face, then send puffs into the air. Shake the MDI for 5 seconds. Position the inhaler so the top of the canister faces up. Put your pointer finger on the top of the canister. Support the bottom of the MDI with your thumb. Breathe out normally and as fully as you can, away from the MDI. Either place the MDI between your teeth and close your  lips tightly around the mouthpiece or hold it 1-2 inches (2.5-5 cm) away from your open mouth. Keep your tongue down out of the way. If you are unsure which technique to use, ask your provider. Press the canister down with your finger to release the medicine. Inhale deeply and slowly through your mouth until your lungs are filled. Do not breathe in through your nose. Inhaling should take 4-6 seconds. Hold the medicine in your lungs for 5-10 seconds. This helps it get into the small airways of your lungs. Remove the inhaler from your mouth. Turn your head and breathe out normally. Wait about 1 minute between puffs. Repeat steps 3-10 until you have taken the number of puffs that your provider told you to. Put the cap on the MDI. How to use an MDI with a spacer  Remove the cap from the MDI. If you are using the MDI for the first time, prime it for use. Read the instructions for your inhaler or ask your provider about the number of puffs needed to prime your MDI. To prime your inhaler, shake it for 5 seconds, turn it away from your face, then send puffs into the air. Shake the MDI for 5 seconds. Put the open end of the spacer onto the mouthpiece. Position the inhaler so the top of the canister faces up and the  spacer mouthpiece faces you. Put your pointer finger on the top of the canister. Support the bottom of the MDI and the spacer with your thumb. Breathe out normally and as fully as you can, away from the spacer. Place the spacer between your teeth. Close your lips tightly around it. Keep your tongue down out of the way. Press the canister down with your finger to release the medicine. Inhale deeply and slowly through your mouth until your lungs are filled. Do not breathe in through your nose. Hold the medicine in your lungs for 5-10 seconds. This helps it get into the small airways of your lungs. Remove the spacer from your mouth. Turn your head, and breathe out normally. Wait about 1 minute  between puffs. Repeat steps 3-11 until you have taken the number of puffs that your provider told you to. Remove the spacer from the inhaler. Put the cap on the MDI. Follow these instructions at home: Caring for your MDI Refill your MDI with medicine before all the preset doses have been used. If your inhaler has a counter, check it to see how full your MDI is. The number you see tells you how many doses are left. If your inhaler does not have a counter, ask your provider when you will need to refill it. Write the refill date on a calendar or on your MDI canister. Keep in mind that you cannot tell when the medicine in an inhaler is empty by shaking it. You may feel or hear something in the canister even when the medicine has been used up. Store your MDI in a cool, dry place at room temperature. Follow instructions on the package insert for care and cleaning of your MDI and spacer. General instructions Take your inhaled medicine only as told by your provider. Do not use the MDI more than you are told to. Do not use any products that contain nicotine or tobacco. These products include cigarettes, chewing tobacco, and vaping devices, such as e-cigarettes. If you need help quitting, ask your provider. Where to find more information Centers for Disease Control and Prevention (CDC): TonerPromos.no American Lung Association: lung.org Contact a health care provider if: Your symptom do not get better with the MDI. You have side effects from the medicine. You are not sure how to use the MDI or your inhaler. Your inhaler is not working like it should. You have a cough that will not go away. You have a very sore mouth or throat and have trouble swallowing. Get help right away if: You have severe shortness of breath or trouble breathing. You have chest tightness or chest pain. You have an allergic reaction. Symptoms may include an itchy rash, swelling of the face or tongue, or trouble breathing. These  symptoms may be an emergency. Get help right away. Call 911. Do not wait to see if the symptoms will go away. Do not drive yourself to the hospital. This information is not intended to replace advice given to you by your health care provider. Make sure you discuss any questions you have with your health care provider. Document Revised: 06/29/2022 Document Reviewed: 06/29/2022 Elsevier Patient Education  2024 [employer].

## 2023-11-21 NOTE — Assessment & Plan Note (Signed)
 Bronchitis, bronchospasm: Sxs  started  ~ 5 weeks ago, seen here 11/06/2023, dx w/  bronchitis, bronchospasm, Rx Zithromax, prednisone and albuterol. At this point cough is better but not completely gone, difficults   his sleep, still having wheezing on exam, still has some GERD symptoms. Plan: Symbicort twice daily, albuterol as needed, Mucinex DM Add hydrocodone twice daily for cough suppression as needed, do not mix with tramadol.  Addendum: Hydrocodone homatroprine not available, switch to Tussionex, prescription sent, will let patient know to be careful with sedation  Nexium -  restart it  at least temporarily. Reviewed with the patient inhaler technique Reportedly  prednisone did not increase his CBGs, if not better consider another round of prednisone. See AVS

## 2023-11-21 NOTE — Progress Notes (Signed)
 Subjective:    Patient ID: Lawrence [name]., male    DOB: Dec 18, 1950, 73 y.o.   MRN: [id number]  DOS:  11/21/2023 Type of visit - description: Acute  Recently seen w/ bronchitis and bronchospasm. Took medication as rx. He is here because although better, cough is not completely gone, still has on and off cough sometimes severe at night. He still produces some clear sputum. Admits to chest congestion and occasional wheezing at night. Denies chest pain or difficulty breathing, occasional heartburn.  Not a  new issue.  Review of Systems See above   Past Medical History:  Diagnosis Date   Anxiety 01/20/2023   Bradycardia    Depression    GERD (gastroesophageal reflux disease)    H/O hiatal hernia    s/p  repair 06-26-2012   History of esophagectomy 08-21-2012  AT [facility]   History of kidney stones 2006   History of malignant neoplasm of esophagus ONCOLOGIST-  DR D'AMICO AT [facility]-- PER LAST NOTE 01/ 2018  NO RECURRENCE   dx 10/ 2013  Stage 2A (T2 N0)  08-21-2012 s/p  esophagectomy w/ gastric pull-through [facility]) post residual recurrent anastomotic stricture (multiple EGD w/ dilations)   History of transient ischemic attack (TIA) 2003   no residual   Hypertension    Long term (current) use of anticoagulants    xarelto   On dofetilide therapy    PAF (paroxysmal atrial fibrillation) (HCC) first dx 2009   primary cardiologist-  dr Verdis Prime /  EP cardiologist -- dr Alden Hipp ([facility])  s/p  afib ablation 08-02-2016   Pancreatic cyst    x2    Peyronie's disease    Presence of permanent cardiac pacemaker implanted 05-01-2014  at [facility]   Dr.Daubert -[facility] follows   Pulmonary nodule, right    upper and middle lobes --  per last CT stable   Type 2 diabetes mellitus treated with insulin (HCC)    Wears glasses     Past Surgical History:  Procedure Laterality Date   ANTERIOR CERVICAL DECOMP/DISCECTOMY FUSION N/A 06/02/2016   Procedure: ANTERIOR CERVICAL DECOMPRESSION  FUSION CERVICAL 5-6, CERVICAL 6-7 WITH INSTRUMENTATION AND ALLOGRAFT;  Surgeon: Estill Bamberg, MD;  Location: [facility] OR;  Service: Orthopedics;  Laterality: N/A;  ANTERIOR CERVICAL DECOMPRESSION FUSION CERVICAL 5-6, CERVICAL 6-7 WITH INSTRUMENTATION AND ALLOGRAFT   BIOPSY  06/26/2012   Procedure: BIOPSY;  Surgeon: Lodema Pilot, DO;  Location: [facility] ORS;  Service: General;;   CARDIAC ELECTROPHYSIOLOGY STUDY AND ABLATION  08-02-2016   [facility]   (dr Alden Hipp)   pulmonary veins isolation and ablation afib   CARDIAC PACEMAKER PLACEMENT  05-01-2014   [facility]   CHOLECYSTECTOMY  1983   COLONOSCOPY WITH PROPOFOL N/A 07/13/2015   Procedure: COLONOSCOPY WITH PROPOFOL;  Surgeon: Charolett Bumpers, MD;  Location: [facility] END[facility]COPY;  Service: Endoscopy;  Laterality: N/A;   ESOPHAGECTOMY  08-21-2012   [facility]   w/ gastric pull-through   ESOPHAGOGASTRODUODENOSCOPY (EGD) WITH ESOPHAGEAL DILATION  multiple x32 per pt approx.--- last one 02-09-2015   recurrent anastomotic stricture post esophagectomy   HIATAL HERNIA REPAIR  06/26/2012   Procedure: LAPAROSCOPIC REPAIR OF HIATAL HERNIA;  Surgeon: Lodema Pilot, DO;  Location: [facility];  Service: General;;   INCISIONAL HERNIA REPAIR  06/26/2012   Procedure: HERNIA REPAIR INCISIONAL;  Surgeon: Lodema Pilot, DO;  Location: [facility] ORS;  Service: General;;   INGUINAL HERNIA REPAIR Bilateral 1982   JEJUNOSTOMY FEEDING TUBE  12/22/2012   Removed 12/ 2016  approx.  KNEE ARTHROSCOPY Left 1990   KNEE ARTHROSCOPY Right 12/2019   LAPAROSCOPIC GASTRIC SLEEVE RESECTION  06/2012   started but abandoned d/t discovery of tumor   LAPAROTOMY  07/12/2021   revision of previous gastric conduit, hiatal hernia repair   MASS EXCISION Left 04/04/2013   Procedure: EXCISION OF SCALP MASS;  Surgeon: Lodema Pilot, DO;  Location: [facility];  Service: General;  Laterality: Left;   pilomatrixoma   NESBIT PROCEDURE N/A 12/09/2016   Procedure: NESBIT PROCEDURE 16 DOT PLICATION;  Surgeon: Ihor Gully, MD;  Location:  [facility];  Service: Urology;  Laterality: N/A;   THORACOTOMY  07/12/2021   revision of previous gastric conduit   TONSILLECTOMY  1959   TRANSTHORACIC ECHOCARDIOGRAM  08-06-2015   [facility]   mild LVH, ef 50%/  mild RAE /  moderate LAE/  trivial AR, MR, PR,  and TR    Current Outpatient Medications  Medication Instructions   albuterol (VENTOLIN HFA) 108 (90 Base) MCG/ACT inhaler 2 puffs, Inhalation, Every 6 hours PRN   Alcohol Swabs (CVS PREP) 70 % PADS See admin instructions   apixaban (ELIQUIS) 5 mg, Oral, 2 times daily   budesonide-formoterol (SYMBICORT) 160-4.5 MCG/ACT inhaler 2 puffs, Inhalation, 2 times daily   chlorpheniramine-HYDROcodone (TUSSIONEX) 10-8 MG/5ML 5 mLs, Oral, Every 12 hours PRN   Cyanocobalamin (VITAMIN B 12 PO) Take by mouth.   ferrous sulfate 325 mg, Daily   FLUoxetine (PROZAC) 60 mg, Oral, Daily   HUMULIN 70/30 (70-30) 100 UNIT/ML injection 16 UNITS IN THE MORNING WITH BREAKFAST AND 12 UNITS IN THE EVENING WITH SUPPER.   Insulin Syringe-Needle U-100 (BD VEO INSULIN SYRINGE U/F) 31G X 15/64" 0.3 ML MISC Use as directed in the morning, at noon, in the evening, and at bedtime.   LORazepam (ATIVAN) 1 MG tablet TAKE 1 TABLET BY MOUTH 2 TIMES DAILY AS NEEDED FOR ANXIETY OR SLEEP.   metoprolol tartrate (LOPRESSOR) 25 mg, 2 times daily   Multiple Vitamins-Minerals (PRESERVISION AREDS PO) 1 tablet, 2 times daily   rosuvastatin (CRESTOR) 40 mg, Daily   traMADol (ULTRAM) 50 mg, Oral, Daily PRN       Objective:   Physical Exam BP 138/80   Pulse 95   Temp 98.2 F (36.8 C) (Oral)   Resp 18   Ht 6\' 1"  (1.854 m)   Wt 231 lb 2 oz (104.8 kg)   SpO2 97%   BMI 30.49 kg/m  General:   Well developed, NAD, BMI noted. HEENT:  Normocephalic . Face symmetric, atraumatic Lungs:  Few bilateral wheezing and rhonchi.  No crackles Normal respiratory effort, no intercostal retractions, no accessory muscle use. Heart: RRR,  no murmur.  Lower extremities: no  pretibial edema bilaterally  Skin: Not pale. Not jaundice Neurologic:  alert & oriented X3.  Speech normal, gait appropriate for age and unassisted Psych--  Cognition and judgment appear intact.  Cooperative with normal attention span and concentration.  Behavior appropriate. No anxious or depressed appearing.      Assessment      Assessment (new patient, previous PCP retired) DM- per endo Mild nonproliferative diabetic retinopathy with macular edema. HTN High cholesterol Anxiety  GI: Esophageal neoplasm 2013 found during a gastric bypass  Hiatal hernia: R thoracotomy Laparotomy lysis of adhesions for repair of paraesophageal hernia in 2022  Cardiovascular: A-fib dx 2014, anticoagulated Pacemaker 2015, Ablation @ [facility] 2017 TIA 2003 ?  MRI negative, MRA some atherosclerosis distal R vertebral artery? Orthostatic hypotension CAD: Moderate-per CT coronary angiogram 01/2023,  medical mngmt [facility]: see surgeries, on tramadol   PLAN Bronchitis, bronchospasm: Sxs  started  ~ 5 weeks ago, seen here 11/06/2023, dx w/  bronchitis, bronchospasm, Rx Zithromax, prednisone and albuterol. At this point cough is better but not completely gone, difficults   his sleep, still having wheezing on exam, still has some GERD symptoms. Plan: Symbicort twice daily, albuterol as needed, Mucinex DM Add hydrocodone twice daily for cough suppression as needed, do not mix with tramadol.  Addendum: Hydrocodone homatroprine not available, switch to Tussionex, prescription sent, will let patient know to be careful with sedation  Nexium -  restart it  at least temporarily. Reviewed with the patient inhaler technique Reportedly  prednisone did not increase his CBGs, if not better consider another round of prednisone. See AVS   Acute problem, not improving as expected, level 4

## 2023-11-23 ENCOUNTER — Telehealth: Payer: Self-pay | Admitting: *Deleted

## 2023-11-23 NOTE — Telephone Encounter (Signed)
 Opened in error

## 2023-11-27 DIAGNOSIS — E1165 Type 2 diabetes mellitus with hyperglycemia: Secondary | ICD-10-CM | POA: Diagnosis not present

## 2023-11-28 ENCOUNTER — Ambulatory Visit: Admitting: Internal Medicine

## 2023-12-04 ENCOUNTER — Other Ambulatory Visit (HOSPITAL_COMMUNITY): Payer: Self-pay

## 2023-12-07 ENCOUNTER — Other Ambulatory Visit (HOSPITAL_COMMUNITY): Payer: Self-pay

## 2023-12-21 ENCOUNTER — Telehealth: Payer: Self-pay | Admitting: Internal Medicine

## 2023-12-21 ENCOUNTER — Other Ambulatory Visit: Payer: Self-pay | Admitting: Endocrinology

## 2023-12-21 NOTE — Telephone Encounter (Signed)
 Received refills for below- we were informed by Pt's wife at her visit with Rice Chamorro that Pt has been drinking alcohol more frequently with these medications. You wanted me to make a note to no longer refill controlled substances on him. He has an appt w/ you on 01/22/24- please advise?

## 2023-12-21 NOTE — Telephone Encounter (Signed)
 Advised patient: Needs office visit before refills. Watch for withdrawal symptoms: Shakiness, sweats, confusion, if that is the case: ER

## 2023-12-21 NOTE — Telephone Encounter (Signed)
 Attempted to contact patient, no answer.  Per PCP, pt needs f/u appt with PCP for refills.  If pt returns call, please advise of needed appointment.  Okay to move May appointment up.

## 2023-12-24 ENCOUNTER — Emergency Department (HOSPITAL_COMMUNITY)
Admission: EM | Admit: 2023-12-24 | Discharge: 2023-12-24 | Disposition: A | Discharge status: Home / Self Care | Attending: Emergency Medicine | Admitting: Emergency Medicine

## 2023-12-24 ENCOUNTER — Other Ambulatory Visit: Payer: Self-pay

## 2023-12-24 ENCOUNTER — Encounter (HOSPITAL_COMMUNITY): Payer: Self-pay | Admitting: Emergency Medicine

## 2023-12-24 ENCOUNTER — Emergency Department (HOSPITAL_COMMUNITY)

## 2023-12-24 DIAGNOSIS — Z794 Long term (current) use of insulin: Secondary | ICD-10-CM | POA: Diagnosis not present

## 2023-12-24 DIAGNOSIS — Z7901 Long term (current) use of anticoagulants: Secondary | ICD-10-CM | POA: Insufficient documentation

## 2023-12-24 DIAGNOSIS — W19XXXA Unspecified fall, initial encounter: Secondary | ICD-10-CM | POA: Diagnosis not present

## 2023-12-24 DIAGNOSIS — Z8501 Personal history of malignant neoplasm of esophagus: Secondary | ICD-10-CM | POA: Diagnosis not present

## 2023-12-24 DIAGNOSIS — S52602A Unspecified fracture of lower end of left ulna, initial encounter for closed fracture: Secondary | ICD-10-CM | POA: Diagnosis not present

## 2023-12-24 DIAGNOSIS — I10 Essential (primary) hypertension: Secondary | ICD-10-CM | POA: Insufficient documentation

## 2023-12-24 DIAGNOSIS — S52251A Displaced comminuted fracture of shaft of ulna, right arm, initial encounter for closed fracture: Secondary | ICD-10-CM | POA: Diagnosis not present

## 2023-12-24 DIAGNOSIS — R739 Hyperglycemia, unspecified: Secondary | ICD-10-CM | POA: Diagnosis not present

## 2023-12-24 DIAGNOSIS — E119 Type 2 diabetes mellitus without complications: Secondary | ICD-10-CM | POA: Insufficient documentation

## 2023-12-24 DIAGNOSIS — S42351A Displaced comminuted fracture of shaft of humerus, right arm, initial encounter for closed fracture: Secondary | ICD-10-CM | POA: Diagnosis not present

## 2023-12-24 DIAGNOSIS — S42201A Unspecified fracture of upper end of right humerus, initial encounter for closed fracture: Secondary | ICD-10-CM

## 2023-12-24 DIAGNOSIS — M25511 Pain in right shoulder: Secondary | ICD-10-CM | POA: Diagnosis present

## 2023-12-24 DIAGNOSIS — S52601A Unspecified fracture of lower end of right ulna, initial encounter for closed fracture: Secondary | ICD-10-CM | POA: Diagnosis not present

## 2023-12-24 DIAGNOSIS — I959 Hypotension, unspecified: Secondary | ICD-10-CM | POA: Diagnosis not present

## 2023-12-24 LAB — CBC
HCT: 39.8 % (ref 39.0–52.0)
Hemoglobin: 13 g/dL (ref 13.0–17.0)
MCH: 32.1 pg (ref 26.0–34.0)
MCHC: 32.7 g/dL (ref 30.0–36.0)
MCV: 98.3 fL (ref 80.0–100.0)
Platelets: 187 10*3/uL (ref 150–400)
RBC: 4.05 MIL/uL — ABNORMAL LOW (ref 4.22–5.81)
RDW: 13 % (ref 11.5–15.5)
WBC: 8.4 10*3/uL (ref 4.0–10.5)
nRBC: 0 % (ref 0.0–0.2)

## 2023-12-24 LAB — BASIC METABOLIC PANEL WITH GFR
Anion gap: 14 (ref 5–15)
BUN: 22 mg/dL (ref 8–23)
CO2: 19 mmol/L — ABNORMAL LOW (ref 22–32)
Calcium: 8.4 mg/dL — ABNORMAL LOW (ref 8.9–10.3)
Chloride: 101 mmol/L (ref 98–111)
Creatinine, Ser: 1.29 mg/dL — ABNORMAL HIGH (ref 0.61–1.24)
GFR, Estimated: 59 mL/min — ABNORMAL LOW (ref >60)
Glucose, Bld: 305 mg/dL — ABNORMAL HIGH (ref 70–99)
Potassium: 4.7 mmol/L (ref 3.5–5.1)
Sodium: 134 mmol/L — ABNORMAL LOW (ref 135–145)

## 2023-12-24 MED ORDER — MORPHINE SULFATE (PF) 4 MG/ML IV SOLN
4.0000 mg | Freq: Once | INTRAVENOUS | Status: AC
  Administered 2023-12-24: 4 mg via INTRAVENOUS
  Filled 2023-12-24: qty 1

## 2023-12-24 MED ORDER — OXYCODONE-ACETAMINOPHEN 5-325 MG PO TABS: 1.0000 | ORAL_TABLET | Freq: Four times a day (QID) | ORAL | 0 refills | Status: DC | PRN

## 2023-12-24 NOTE — Discharge Instructions (Signed)
 Take the medications to help with the pain and discomfort.  You can also apply ice to help with swelling and pain.  Call the orthopedic doctors office tomorrow to schedule a follow-up appointment.

## 2023-12-24 NOTE — ED Provider Notes (Signed)
 [place] EMERGENCY DEPARTMENT AT [facility] Provider Note   CSN: [id number] Arrival date & time: 12/24/23  2035     History  Chief Complaint  Patient presents with   Lawrence Grise. is a 73 y.o. male.   Fall     Patient has a history of hypertension acid reflux esophageal cancer, TIA, orthostatic hypotension, diabetes.  Patient states normally he is very careful when he stands up to stand up slowly.  Today however he was startled and ended up getting of the bed quickly.  He felt lightheaded and fell forward.  Patient denies hitting his head or losing consciousness.  He fell right onto his shoulder and now has pain in his right shoulder as well as his elbow and arm.  Home Medications Prior to Admission medications   Medication Sig Start Date End Date Taking? Authorizing Provider  albuterol  (VENTOLIN  HFA) 108 (90 Base) MCG/ACT inhaler Inhale 2 puffs into the lungs every 6 (six) hours as needed for wheezing or shortness of breath. 11/06/23   Ezell Hollow, MD  Alcohol Swabs (CVS PREP) 70 % PADS USE AS DIRECTED 11/01/23   Ezell Hollow, MD  apixaban  (ELIQUIS ) 5 MG TABS tablet Take 1 tablet (5 mg total) by mouth 2 (two) times daily. 03/08/23   Lenise Quince, MD  budesonide -formoterol  (SYMBICORT ) 160-4.5 MCG/ACT inhaler Inhale 2 puffs into the lungs 2 (two) times daily. 11/21/23   Paz, Jose E, MD  chlorpheniramine-HYDROcodone  (TUSSIONEX) 10-8 MG/5ML Take 5 mLs by mouth every 12 (twelve) hours as needed for cough. 11/21/23   Paz, Jose E, MD  Cyanocobalamin  (VITAMIN B 12 PO) Take by mouth. 05/12/16   [provider]  ferrous sulfate 325 (65 FE) MG tablet Take 325 mg by mouth daily.    [provider]  FLUoxetine  (PROZAC ) 20 MG tablet Take 3 tablets (60 mg total) by mouth daily. 08/02/23   Ezell Hollow, MD  HUMULIN  70/30 (70-30) 100 UNIT/ML injection 20 UNITS IN THE MORNING WITH BREAKFAST AND 8 UNITS IN THE EVENING WITH SUPPER. 12/21/23   Thapa, [place], MD   Insulin  Syringe-Needle U-100 (BD VEO INSULIN  SYRINGE U/F) 31G X 15/64" 0.3 ML MISC Use as directed in the morning, at noon, in the evening, and at bedtime. 07/24/23   Ezell Hollow, MD  LORazepam  (ATIVAN ) 1 MG tablet TAKE 1 TABLET BY MOUTH 2 TIMES DAILY AS NEEDED FOR ANXIETY OR SLEEP. 10/16/23   Paz, Jose E, MD  metoprolol  tartrate (LOPRESSOR ) 25 MG tablet Take 25 mg by mouth 2 (two) times daily.    [provider]  Multiple Vitamins-Minerals (PRESERVISION AREDS PO) Take 1 tablet by mouth in the morning and at bedtime. Preservision AREDS    [provider]  oxyCODONE -acetaminophen  (PERCOCET/ROXICET) 5-325 MG tablet Take 1 tablet by mouth every 6 (six) hours as needed for severe pain (pain score 7-10). 12/24/23  Yes Trish Furl, MD  rosuvastatin  (CRESTOR ) 40 MG tablet Take 40 mg by mouth daily. 01/25/23   Ezell Hollow, MD  traMADol  (ULTRAM ) 50 MG tablet Take 1 tablet (50 mg total) by mouth daily as needed for moderate pain (pain score 4-6). 10/24/23   Ezell Hollow, MD      Allergies    Chlorthalidone, Lisinopril , and Metformin  hcl    Review of Systems   Review of Systems  Physical Exam Updated Vital Signs BP 114/76 (BP Location: Left Arm)   Pulse 84   Temp 97.9  F (36.6 C) (Oral)   Resp 16   Ht 1.854 m (6\' 1" )   Wt 104.3 kg   SpO2 98%   BMI 30.34 kg/m  Physical Exam Vitals and nursing note reviewed.  Constitutional:      General: He is not in acute distress.    Appearance: He is well-developed.  HENT:     Head: Normocephalic and atraumatic.     Right Ear: External ear normal.     Left Ear: External ear normal.  Eyes:     General: No scleral icterus.       Right eye: No discharge.        Left eye: No discharge.     Conjunctiva/sclera: Conjunctivae normal.  Neck:     Trachea: No tracheal deviation.  Cardiovascular:     Rate and Rhythm: Normal rate and regular rhythm.  Pulmonary:     Effort: Pulmonary effort is normal. No respiratory distress.     Breath sounds:  Normal breath sounds. No stridor. No wheezing or rales.  Abdominal:     General: Bowel sounds are normal. There is no distension.     Palpations: Abdomen is soft.     Tenderness: There is no abdominal tenderness. There is no guarding or rebound.  Musculoskeletal:        General: Tenderness present. No swelling or deformity.     Cervical back: Neck supple.     Comments: Tenderness palpation right shoulder, small ecchymoses noted, mild tenderness palpation right elbow and mid forearm, no cervical thoracic or lumbar spine tenderness, no tenderness in his left upper extremity or bilateral lower extremities  Skin:    General: Skin is warm and dry.     Findings: No rash.  Neurological:     General: No focal deficit present.     Mental Status: He is alert. Mental status is at baseline.     Cranial Nerves: No cranial nerve deficit, dysarthria or facial asymmetry.     Sensory: No sensory deficit.     Motor: No abnormal muscle tone or seizure activity.     Coordination: Coordination normal.  Psychiatric:        Mood and Affect: Mood normal.     ED Results / Procedures / Treatments   Labs (all labs ordered are listed, but only abnormal results are displayed) Labs Reviewed  CBC - Abnormal; Notable for the following components:      Result Value   RBC 4.05 (*)    All other components within normal limits  BASIC METABOLIC PANEL WITH GFR - Abnormal; Notable for the following components:   Sodium 134 (*)    CO2 19 (*)    Glucose, Bld 305 (*)    Creatinine, Ser 1.29 (*)    Calcium  8.4 (*)    GFR, Estimated 59 (*)    All other components within normal limits    EKG None  Radiology DG Elbow Complete Right Result Date: 12/24/2023 CLINICAL DATA:  Pain after fall EXAM: RIGHT ELBOW - COMPLETE 3+ VIEW; RIGHT FOREARM - 2 VIEW COMPARISON:  None Available. FINDINGS: Elbow: No definite fracture or dislocation in the elbow noting decreased sensitivity due to unusual obliquities no elbow joint  effusion. Soft tissues are unremarkable. Forearm: Comminuted minimally displaced fracture of the distal right ulnar metadiaphysis. Fracture lines extend into the distal radioulnar joint. IMPRESSION: Comminuted fracture of the distal right ulna. No definite fracture or dislocation in the elbow. Electronically Signed   By: Christell Cove.D.  On: 12/24/2023 22:48   DG Forearm Right Result Date: 12/24/2023 CLINICAL DATA:  Pain after fall EXAM: RIGHT ELBOW - COMPLETE 3+ VIEW; RIGHT FOREARM - 2 VIEW COMPARISON:  None Available. FINDINGS: Elbow: No definite fracture or dislocation in the elbow noting decreased sensitivity due to unusual obliquities no elbow joint effusion. Soft tissues are unremarkable. Forearm: Comminuted minimally displaced fracture of the distal right ulnar metadiaphysis. Fracture lines extend into the distal radioulnar joint. IMPRESSION: Comminuted fracture of the distal right ulna. No definite fracture or dislocation in the elbow. Electronically Signed   By: Rozell Cornet M.D.   On: 12/24/2023 22:48   DG Shoulder Right Result Date: 12/24/2023 CLINICAL DATA:  Pain after fall EXAM: RIGHT SHOULDER - 2+ VIEW COMPARISON:  None Available. FINDINGS: Acute comminuted fracture of the proximal right humeral diaphysis. There is posterior displacement and apex posterior angulation of the dominant distal fragment. Fracture lines extend to the surgical neck of the humerus. No evidence of extension into the glenohumeral joint. No dislocation. IMPRESSION: Acute comminuted angulated fracture of the proximal humeral diaphysis. Electronically Signed   By: Rozell Cornet M.D.   On: 12/24/2023 22:45    Procedures Procedures    Medications Ordered in ED Medications  morphine  (PF) 4 MG/ML injection 4 mg (has no administration in time range)  morphine  (PF) 4 MG/ML injection 4 mg (4 mg Intravenous Given 12/24/23 2218)    ED Course/ Medical Decision Making/ A&P Clinical Course as of 12/24/23 2306   Sun Dec 24, 2023  2147 CBC(!) CBC normal.  Metabolic panel shows hyperglycemia [JK]  2257 X-ray shows a comminuted fracture of the distal right ulna. [JK]  2258 No elbow dislocation or fracture [JK]  2258 Shoulder shows acute comminuted angulated fracture of the proximal humeral diaphysis [JK]    Clinical Course User Index [JK] Trish Furl, MD                                 Medical Decision Making Amount and/or Complexity of Data Reviewed Labs: ordered. Decision-making details documented in ED Course. Radiology: ordered.  Risk Prescription drug management.   Patient presented to the ED for evaluation of a fall.  Patient states he stood up quickly and became lightheaded.  He has a history of having trouble with orthostatic hypotension.  Patient is on Eliquis  however he did not hit his head or lose consciousness.  His x-rays are consistent with a proximal humerus fracture as well as a distal ulnar fracture.  No evidence of dislocation.  Patient's neurovascular intact patient given medications for pain.  He was placed in a coaptation splint and a sugar-tong splint.  Will have him follow-up closely with orthopedics as an outpatient        Final Clinical Impression(s) / ED Diagnoses Final diagnoses:  Closed fracture of proximal end of right humerus, unspecified fracture morphology, initial encounter  Closed fracture of distal end of right ulna, unspecified fracture morphology, initial encounter    Rx / DC Orders ED Discharge Orders          Ordered    oxyCODONE -acetaminophen  (PERCOCET/ROXICET) 5-325 MG tablet  Every 6 hours PRN        12/24/23 2305              Trish Furl, MD 12/24/23 2306

## 2023-12-24 NOTE — ED Triage Notes (Signed)
 Pt to ED via [facility] from home c/o fall tonight.  States was getting up and became dizzy and fell forward hitting couch.  Denies hitting head or LOC, did hit right shoulder with new pain and possible deformity.  Hx orthostatic hypotension, takes eliquis .  Distal pulses and movement intact.

## 2023-12-25 ENCOUNTER — Other Ambulatory Visit: Payer: Self-pay | Admitting: Orthopedic Surgery

## 2023-12-25 ENCOUNTER — Telehealth: Payer: Self-pay

## 2023-12-25 DIAGNOSIS — K760 Fatty (change of) liver, not elsewhere classified: Secondary | ICD-10-CM | POA: Diagnosis not present

## 2023-12-25 DIAGNOSIS — K7581 Nonalcoholic steatohepatitis (NASH): Secondary | ICD-10-CM | POA: Diagnosis not present

## 2023-12-25 DIAGNOSIS — M25511 Pain in right shoulder: Secondary | ICD-10-CM | POA: Diagnosis not present

## 2023-12-25 DIAGNOSIS — M25531 Pain in right wrist: Secondary | ICD-10-CM | POA: Diagnosis not present

## 2023-12-25 DIAGNOSIS — K7400 Hepatic fibrosis, unspecified: Secondary | ICD-10-CM | POA: Diagnosis not present

## 2023-12-25 NOTE — Telephone Encounter (Signed)
   Pre-operative Risk Assessment    Patient Name: Lawrence [name].  DOB: 1950-12-08 MRN: [id number]   Date of last office visit: 03/29/2023 Dr. Alexandria Angel, MD Date of next office visit: NONE   Request for Surgical Clearance    Procedure:   Right Humerus Intramedullary Nail For Fracture  Date of Surgery:  Clearance 12/28/23                                Surgeon: Dr. Angelo Barge, MD Surgeon's Group or Practice Name: [facility]  Phone number: [phone] Fax number: [phone]   Type of Clearance Requested:   - Medical  - Pharmacy:  Hold Apixaban  (Eliquis ) 2 days   Type of Anesthesia:   Choice   Additional requests/questions:    Tyrus Gallus   12/25/2023, 4:02 PM

## 2023-12-25 NOTE — Progress Notes (Signed)
 Orthopedic Tech Progress Note Patient Details:  Lawrence [name] 06/06/1951 [id number]  Ortho Devices Type of Ortho Device: Arm sling, Coapt, Sugartong splint Ortho Device/Splint Location: rue Ortho Device/Splint Interventions: Ordered, Application, Adjustment   Post Interventions Patient Tolerated: Well Instructions Provided: Care of device, Adjustment of device  [name] 12/25/2023, 7:09 AM

## 2023-12-26 ENCOUNTER — Ambulatory Visit: Attending: Nurse Practitioner | Admitting: Nurse Practitioner

## 2023-12-26 ENCOUNTER — Encounter: Payer: Self-pay | Admitting: Internal Medicine

## 2023-12-26 ENCOUNTER — Ambulatory Visit: Admitting: Internal Medicine

## 2023-12-26 VITALS — BP 126/80 | HR 113 | Temp 97.7°F | Resp 16 | Ht 73.0 in | Wt 236.2 lb

## 2023-12-26 DIAGNOSIS — Z01810 Encounter for preprocedural cardiovascular examination: Secondary | ICD-10-CM

## 2023-12-26 DIAGNOSIS — S52601G Unspecified fracture of lower end of right ulna, subsequent encounter for closed fracture with delayed healing: Secondary | ICD-10-CM

## 2023-12-26 DIAGNOSIS — S42201G Unspecified fracture of upper end of right humerus, subsequent encounter for fracture with delayed healing: Secondary | ICD-10-CM | POA: Diagnosis not present

## 2023-12-26 DIAGNOSIS — W19XXXD Unspecified fall, subsequent encounter: Secondary | ICD-10-CM

## 2023-12-26 DIAGNOSIS — F1010 Alcohol abuse, uncomplicated: Secondary | ICD-10-CM

## 2023-12-26 MED ORDER — HYDROXYZINE HCL 25 MG PO TABS: 25.0000 mg | ORAL_TABLET | Freq: Two times a day (BID) | ORAL | 1 refills | Status: DC | PRN

## 2023-12-26 NOTE — Telephone Encounter (Signed)
 Spoke with patient who is agreeable to do a tele visit on 4/22 at 10:40 am. Med rec and consent have been done.

## 2023-12-26 NOTE — Patient Instructions (Addendum)
 SURGICAL WAITING ROOM VISITATION Patients having surgery or a procedure may have no more than 2 support people in the waiting area - these visitors may rotate.    Children under the age of 11 must have an adult with them who is not the patient.  If the patient needs to stay at the hospital during part of their recovery, the visitor guidelines for inpatient rooms apply. Pre-op nurse will coordinate an appropriate time for 1 support person to accompany patient in pre-op.  This support person may not rotate.    Please refer to the [facility] website for the visitor guidelines for Inpatients (after your surgery is over and you are in a regular room).       Your procedure is scheduled on: 12-28-23   Report to [facility] Main Entrance    Report to admitting at 7:30 AM   Call this number if you have problems the morning of surgery [phone]   Do not eat food :After Midnight.   After Midnight you may have the following liquids until 6:45 AM DAY OF SURGERY  Water  Non-Citrus Juices (without pulp, NO RED-Apple, White grape, White cranberry) Black Coffee (NO MILK/CREAM OR CREAMERS, sugar ok)  Clear Tea (NO MILK/CREAM OR CREAMERS, sugar ok) regular and decaf                             Plain Jell-O (NO RED)                                           Fruit ices (not with fruit pulp, NO RED)                                     Popsicles (NO RED)                                                               Sports drinks like Gatorade (NO RED)                   The day of surgery:  Drink ONE (1) Pre-Surgery G2 by 6:45 AM the morning of surgery. Drink in one sitting. Do not sip.  This drink was given to you during your hospital  pre-op appointment visit. Nothing else to drink after completing the Pre-Surgery G2.          If you have questions, please contact your surgeon's office.   FOLLOW ANY ADDITIONAL PRE OP INSTRUCTIONS YOU RECEIVED FROM YOUR SURGEON'S OFFICE!!!     Oral  Hygiene is also important to reduce your risk of infection.                                    Remember - BRUSH YOUR TEETH THE MORNING OF SURGERY WITH YOUR REGULAR TOOTHPASTE   Do NOT smoke after Midnight   Take these medicines the morning of surgery with A SIP OF WATER :    Fluoxetine    Metoprolol    Rosuvastatin   Okay to use inhaler   If needed Lorazepam , Oxycodone   Stop all vitamins and herbal supplements 7 days before surgery  How to Manage Your Diabetes Before and After Surgery  Why is it important to control my blood sugar before and after surgery? Improving blood sugar levels before and after surgery helps healing and can limit problems. A way of improving blood sugar control is eating a healthy diet by:  Eating less sugar and carbohydrates  Increasing activity/exercise  Talking with your doctor about reaching your blood sugar goals High blood sugars (greater than 180 mg/dL) can raise your risk of infections and slow your recovery, so you will need to focus on controlling your diabetes during the weeks before surgery. Make sure that the doctor who takes care of your diabetes knows about your planned surgery including the date and location.  How do I manage my blood sugar before surgery? Check your blood sugar at least 4 times a day, starting 2 days before surgery, to make sure that the level is not too high or low. Check your blood sugar the morning of your surgery when you wake up and every 2 hours until you get to the Short Stay unit. If your blood sugar is less than 70 mg/dL, you will need to treat for low blood sugar: Do not take insulin . Treat a low blood sugar (less than 70 mg/dL) with  cup of clear juice (cranberry or apple), 4 glucose tablets, OR glucose gel. Recheck blood sugar in 15 minutes after treatment (to make sure it is greater than 70 mg/dL). If your blood sugar is not greater than 70 mg/dL on recheck, call [phone] for further instructions. Report your  blood sugar to the short stay nurse when you get to Short Stay.  If you are admitted to the hospital after surgery: Your blood sugar will be checked by the staff and you will probably be given insulin  after surgery (instead of oral diabetes medicines) to make sure you have good blood sugar levels. The goal for blood sugar control after surgery is 80-180 mg/dL.   WHAT DO I DO ABOUT MY DIABETES MEDICATION?  Do not take oral diabetes medicines (pills) the morning of surgery.  THE NIGHT BEFORE SURGERY, take 70% (5 units) of Humulin  insulin .    THE MORNING OF SURGERY:  Do not take Insulin .  DO NOT TAKE THE FOLLOWING 7 DAYS PRIOR TO SURGERY: Ozempic, Wegovy, Rybelsus (Semaglutide), Byetta (exenatide), Bydureon (exenatide ER), Victoza, Saxenda (liraglutide), or Trulicity (dulaglutide) Mounjaro (Tirzepatide) Adlyxin (Lixisenatide), Polyethylene Glycol Loxenatide.  Reviewed and Endorsed by [facility] Patient Education Committee, August 2015  Bring CPAP mask and tubing day of surgery.                              You may not have any metal on your body including  jewelry, and body piercing             Do not wear  lotions, powders, cologne, or deodorant              Men may shave face and neck.   Do not bring valuables to the hospital. [place] IS NOT RESPONSIBLE   FOR VALUABLES.   Contacts, dentures or bridgework may not be worn into surgery.   Bring small overnight bag day of surgery.   DO NOT BRING YOUR HOME MEDICATIONS TO THE HOSPITAL. PHARMACY WILL DISPENSE MEDICATIONS LISTED ON YOUR MEDICATION LIST TO  YOU DURING YOUR ADMISSION IN THE HOSPITAL!    Special Instructions: Bring a copy of your healthcare power of attorney and living will documents the day of surgery if you haven't scanned them before.              Please read over the following fact sheets you were given: IF YOU HAVE QUESTIONS ABOUT YOUR PRE-OP INSTRUCTIONS PLEASE CALL [phone]  If you received a COVID test  during your pre-op visit  it is requested that you wear a mask when out in public, stay away from anyone that may not be feeling well and notify your surgeon if you develop symptoms. If you test positive for Covid or have been in contact with anyone that has tested positive in the last 10 days please notify you surgeon.  [place] [facility] - Preparing for Surgery Before surgery, you can play an important role.  Because skin is not sterile, your skin needs to be as free of germs as possible.  You can reduce the number of germs on your skin by washing with CHG (chlorahexidine gluconate) soap before surgery.  CHG is an antiseptic cleaner which kills germs and bonds with the skin to continue killing germs even after washing. Please DO NOT use if you have an allergy to CHG or antibacterial soaps.  If your skin becomes reddened/irritated stop using the CHG and inform your nurse when you arrive at Short Stay. Do not shave (including legs and underarms) for at least 48 hours prior to the first CHG shower.  You may shave your face/neck.  Please follow these instructions carefully:  1.  Shower with CHG Soap the night before surgery and the  morning of surgery.  2.  If you choose to wash your hair, wash your hair first as usual with your normal  shampoo.  3.  After you shampoo, rinse your hair and body thoroughly to remove the shampoo.                             4.  Use CHG as you would any other liquid soap.  You can apply chg directly to the skin and wash.  Gently with a scrungie or clean washcloth.  5.  Apply the CHG Soap to your body ONLY FROM THE NECK DOWN.   Do   not use on face/ open                           Wound or open sores. Avoid contact with eyes, ears mouth and   genitals (private parts).                       Wash face,  Genitals (private parts) with your normal soap.             6.  Wash thoroughly, paying special attention to the area where your    surgery  will be performed.  7.  Thoroughly rinse  your body with warm water  from the neck down.  8.  DO NOT shower/wash with your normal soap after using and rinsing off the CHG Soap.                9.  Pat yourself dry with a clean towel.            10.  Wear clean pajamas.  11.  Place clean sheets on your bed the night of your first shower and do not  sleep with pets. Day of Surgery : Do not apply any lotions/deodorants the morning of surgery.  Please wear clean clothes to the hospital/surgery center.  FAILURE TO FOLLOW THESE INSTRUCTIONS MAY RESULT IN THE CANCELLATION OF YOUR SURGERY  PATIENT SIGNATURE_________________________________  NURSE SIGNATURE__________________________________  ________________________________________________________________________

## 2023-12-26 NOTE — Progress Notes (Signed)
 Virtual Visit via Telephone Note   Because of Tavien Chestnut. co-morbid illnesses, he is at least at moderate risk for complications without adequate follow up.  This format is felt to be most appropriate for this patient at this time.  Due to technical limitations with video connection (technology), today's appointment will be conducted as an audio only telehealth visit, and Lawrence Dozal. verbally agreed to proceed in this manner.   All issues noted in this document were discussed and addressed.  No physical exam could be performed with this format.  Evaluation Performed:  Preoperative cardiovascular risk assessment _____________   Date:  12/26/2023   Patient ID:  Lawrence Butters., DOB [birth year], MRN [id number] Patient Location:  Home Provider location:   Office  Primary Care Provider:  Ezell Hollow, MD Primary Cardiologist:  Alexandria Angel, MD  Chief Complaint / Patient Profile   73 y.o. y/o male with a h/o paroxysmal atrial fibrillation, elevated coronary artery calcium  score,  bradycardia s/p PPM, TIA, hypertension, orthostatic hypotension, syncope, hyperlipidemia, type 2 diabetes, and GERD who is pending Right Humerus Intramedullary Nail For Fracture on 12/28/2023 with Dr. Sammye Cristal of [facility] and presents today for telephonic preoperative cardiovascular risk assessment.  History of Present Illness    Lawrence Ishman. is a 73 y.o. male who presents via audio/video conferencing for a telehealth visit today.  Pt was last seen in cardiology clinic on 03/29/2023 by Dr. Audery Blazing.  At that time Lawrence Mentink. was doing well.  The patient is now pending procedure as outlined above. Since his last visit, he has been stable from a cardiac standpoint.  He continues to have episodes of orthostatic hypotension, orthostatic dizziness and presyncope, unchanged from prior visits.  He has had multiple falls in the setting.  He does have prior  episodes of syncope.  He  denies any syncope in the past 6 months.  He denies chest pain, palpitations, dyspnea, pnd, orthopnea, n, v, edema, weight gain, or early satiety. All other systems reviewed and are otherwise negative except as noted above.   Past Medical History    Past Medical History:  Diagnosis Date   Anxiety 01/20/2023   Bradycardia    Depression    GERD (gastroesophageal reflux disease)    H/O hiatal hernia    s/p  repair 06-26-2012   History of esophagectomy 08-21-2012  AT [facility]   History of kidney stones 2006   History of malignant neoplasm of esophagus ONCOLOGIST-  DR D'AMICO AT [facility]-- PER LAST NOTE 01/ 2018  NO RECURRENCE   dx 10/ 2013  Stage 2A (T2 N0)  08-21-2012 s/p  esophagectomy w/ gastric pull-through [facility]) post residual recurrent anastomotic stricture (multiple EGD w/ dilations)   History of transient ischemic attack (TIA) 2003   no residual   Hypertension    Long term (current) use of anticoagulants    xarelto    On dofetilide  therapy    PAF (paroxysmal atrial fibrillation) (HCC) first dx 2009   primary cardiologist-  dr Kay Parson /  EP cardiologist -- dr Arvell Birchwood ([facility])  s/p  afib ablation 08-02-2016   Pancreatic cyst    x2    Peyronie's disease    Presence of permanent cardiac pacemaker implanted 05-01-2014  at [facility]   Dr.Daubert -[facility] follows   Pulmonary nodule, right    upper and middle lobes --  per last CT stable   Type 2 diabetes mellitus  treated with insulin  (HCC)    Wears glasses    Past Surgical History:  Procedure Laterality Date   ANTERIOR CERVICAL DECOMP/DISCECTOMY FUSION N/A 06/02/2016   Procedure: ANTERIOR CERVICAL DECOMPRESSION FUSION CERVICAL 5-6, CERVICAL 6-7 WITH INSTRUMENTATION AND ALLOGRAFT;  Surgeon: Virl Grimes, MD;  Location: [facility] OR;  Service: Orthopedics;  Laterality: N/A;  ANTERIOR CERVICAL DECOMPRESSION FUSION CERVICAL 5-6, CERVICAL 6-7 WITH INSTRUMENTATION AND ALLOGRAFT   BIOPSY  06/26/2012   Procedure:  BIOPSY;  Surgeon: Evander Hills, DO;  Location: [facility] ORS;  Service: General;;   CARDIAC ELECTROPHYSIOLOGY STUDY AND ABLATION  08-02-2016   [facility]   (dr Arvell Birchwood)   pulmonary veins isolation and ablation afib   CARDIAC PACEMAKER PLACEMENT  05-01-2014   [facility]   CHOLECYSTECTOMY  1983   COLONOSCOPY WITH PROPOFOL  N/A 07/13/2015   Procedure: COLONOSCOPY WITH PROPOFOL ;  Surgeon: Garrett Kallman, MD;  Location: [facility] ENDOSCOPY;  Service: Endoscopy;  Laterality: N/A;   ESOPHAGECTOMY  08-21-2012   [facility]   w/ gastric pull-through   ESOPHAGOGASTRODUODENOSCOPY (EGD) WITH ESOPHAGEAL DILATION  multiple x32 per pt approx.--- last one 02-09-2015   recurrent anastomotic stricture post esophagectomy   HIATAL HERNIA REPAIR  06/26/2012   Procedure: LAPAROSCOPIC REPAIR OF HIATAL HERNIA;  Surgeon: Evander Hills, DO;  Location: [facility] ORS;  Service: General;;   INCISIONAL HERNIA REPAIR  06/26/2012   Procedure: HERNIA REPAIR INCISIONAL;  Surgeon: Evander Hills, DO;  Location: [facility] ORS;  Service: General;;   INGUINAL HERNIA REPAIR Bilateral 1982   JEJUNOSTOMY FEEDING TUBE  12/22/2012   Removed 12/ 2016  approx.   KNEE ARTHROSCOPY Left 1990   KNEE ARTHROSCOPY Right 12/2019   LAPAROSCOPIC GASTRIC SLEEVE RESECTION  06/2012   started but abandoned d/t discovery of tumor   LAPAROTOMY  07/12/2021   revision of previous gastric conduit, hiatal hernia repair   MASS EXCISION Left 04/04/2013   Procedure: EXCISION OF SCALP MASS;  Surgeon: Evander Hills, DO;  Location: [facility] ORS;  Service: General;  Laterality: Left;   pilomatrixoma   NESBIT PROCEDURE N/A 12/09/2016   Procedure: NESBIT PROCEDURE 16 DOT PLICATION;  Surgeon: Mark Ottelin, MD;  Location: [facility];  Service: Urology;  Laterality: N/A;   THORACOTOMY  07/12/2021   revision of previous gastric conduit   TONSILLECTOMY  1959   TRANSTHORACIC ECHOCARDIOGRAM  08-06-2015   [facility]   mild LVH, ef 50%/  mild RAE /  moderate LAE/  trivial AR, MR, PR,  and TR     Allergies  Allergies  Allergen Reactions   Chlorthalidone Other (See Comments)    urinary urgency   Lisinopril  Other (See Comments)    cough   Metformin  Hcl Other (See Comments)    fecal urgency    Home Medications    Prior to Admission medications   Medication Sig Start Date End Date Taking? Authorizing Provider  albuterol  (VENTOLIN  HFA) 108 (90 Base) MCG/ACT inhaler Inhale 2 puffs into the lungs every 6 (six) hours as needed for wheezing or shortness of breath. Patient not taking: Reported on 12/25/2023 11/06/23   Ezell Hollow, MD  Alcohol Swabs ([facility] PREP) 70 % PADS USE AS DIRECTED 11/01/23   Paz, Jose E, MD  apixaban  (ELIQUIS ) 5 MG TABS tablet Take 1 tablet (5 mg total) by mouth 2 (two) times daily. 03/08/23   Lenise Quince, MD  budesonide -formoterol  (SYMBICORT ) 160-4.5 MCG/ACT inhaler Inhale 2 puffs into the lungs 2 (two) times daily. Patient taking differently: Inhale 2 puffs into the lungs 2 (two) times  daily as needed (Cough). 11/21/23   Paz, Jose E, MD  chlorpheniramine-HYDROcodone  (TUSSIONEX) 10-8 MG/5ML Take 5 mLs by mouth every 12 (twelve) hours as needed for cough. 11/21/23   Ezell Hollow, MD  Continuous Glucose Receiver (FREESTYLE LIBRE 3 READER) DEVI by Does not apply route.    [provider]  Cyanocobalamin  (VITAMIN B 12 PO) Take 1,000 mg by mouth daily. 05/12/16   [provider]  ferrous sulfate 325 (65 FE) MG tablet Take 325 mg by mouth daily.    [provider]  FLUoxetine  (PROZAC ) 20 MG tablet Take 3 tablets (60 mg total) by mouth daily. 08/02/23   Ezell Hollow, MD  HUMULIN  70/30 (70-30) 100 UNIT/ML injection 20 UNITS IN THE MORNING WITH BREAKFAST AND 8 UNITS IN THE EVENING WITH SUPPER. 12/21/23   Thapa, [place], MD  Insulin  Syringe-Needle U-100 (BD VEO INSULIN  SYRINGE U/F) 31G X 15/64" 0.3 ML MISC Use as directed in the morning, at noon, in the evening, and at bedtime. 07/24/23   Ezell Hollow, MD  LORazepam  (ATIVAN ) 1 MG tablet TAKE 1 TABLET BY  MOUTH 2 TIMES DAILY AS NEEDED FOR ANXIETY OR SLEEP. Patient taking differently: Take 1 mg by mouth at bedtime. Additional during the day if needed 10/16/23   Paz, Jose E, MD  metoprolol  tartrate (LOPRESSOR ) 25 MG tablet Take 25 mg by mouth 2 (two) times daily.    [provider]  Multiple Vitamins-Minerals (PRESERVISION AREDS PO) Take 1 tablet by mouth in the morning and at bedtime. Preservision AREDS    [provider]  oxyCODONE -acetaminophen  (PERCOCET/ROXICET) 5-325 MG tablet Take 1 tablet by mouth every 6 (six) hours as needed for severe pain (pain score 7-10). 12/24/23   Trish Furl, MD  rosuvastatin  (CRESTOR ) 40 MG tablet Take 40 mg by mouth daily. 01/25/23   Ezell Hollow, MD  traMADol  (ULTRAM ) 50 MG tablet Take 1 tablet (50 mg total) by mouth daily as needed for moderate pain (pain score 4-6). Patient taking differently: Take 50 mg by mouth every evening. 10/24/23   Ezell Hollow, MD    Physical Exam    Vital Signs:  Lawrence Harju. does not have vital signs available for review today.  Given telephonic nature of communication, physical exam is limited. AAOx3. NAD. Normal affect.  Speech and respirations are unlabored.  Accessory Clinical Findings    None  Assessment & Plan    1.  Preoperative Cardiovascular Risk Assessment:  According to the Revised Cardiac Risk Index (RCRI), his Perioperative Risk of Major Cardiac Event is (%): 6.6. His Functional Capacity in METs is: 6.27 according to the [facility] Activity Status Index (DASI). Therefore, based on ACC/AHA guidelines, patient would be at acceptable risk for the planned procedure without further cardiovascular testing.   The patient was advised that if he develops new symptoms prior to surgery to contact our office to arrange for a follow-up visit, and he verbalized understanding.   Per office protocol, patient can hold Eliquis  for 2 days prior to procedure.  Please resume Eliquis  as soon as possible postprocedure, at  the discretion of the surgeon.   A copy of this note will be routed to requesting surgeon.  Time:   Today, I have spent 10 minutes with the patient with telehealth technology discussing medical history, symptoms, and management plan.     Jude Norton, NP  12/26/2023, 10:54 AM

## 2023-12-26 NOTE — Progress Notes (Unsigned)
 Subjective:    Patient ID: Lawrence Butters., male    DOB: 12-06-1950, 73 y.o.   MRN: [id number]  DOS:  12/26/2023 Type of visit - description: ED follow-up, here with his wife  Shelah Derry to the ER 2 days ago after a fall. Reports he got lightheaded after he quickly get out from bed. At the ER he was diagnosed with a R humerus and ulna fracture. Scheduled for a ORIF  We also had a long discussion about alcohol abuse and anxiety.  Review of Systems See above   Past Medical History:  Diagnosis Date   Anxiety 01/20/2023   Bradycardia    Depression    GERD (gastroesophageal reflux disease)    H/O hiatal hernia    s/p  repair 06-26-2012   History of esophagectomy 08-21-2012  AT [facility]   History of kidney stones 2006   History of malignant neoplasm of esophagus ONCOLOGIST-  DR D'AMICO AT [facility]-- PER LAST NOTE 01/ 2018  NO RECURRENCE   dx 10/ 2013  Stage 2A (T2 N0)  08-21-2012 s/p  esophagectomy w/ gastric pull-through [facility]) post residual recurrent anastomotic stricture (multiple EGD w/ dilations)   History of transient ischemic attack (TIA) 2003   no residual   Hypertension    Long term (current) use of anticoagulants    xarelto    On dofetilide  therapy    PAF (paroxysmal atrial fibrillation) (HCC) first dx 2009   primary cardiologist-  dr Kay Parson /  EP cardiologist -- dr Arvell Birchwood ([facility])  s/p  afib ablation 08-02-2016   Pancreatic cyst    x2    Peyronie's disease    Presence of permanent cardiac pacemaker implanted 05-01-2014  at [facility]   Dr.Daubert -[facility] follows   Pulmonary nodule, right    upper and middle lobes --  per last CT stable   Type 2 diabetes mellitus treated with insulin  (HCC)    Wears glasses     Past Surgical History:  Procedure Laterality Date   ANTERIOR CERVICAL DECOMP/DISCECTOMY FUSION N/A 06/02/2016   Procedure: ANTERIOR CERVICAL DECOMPRESSION FUSION CERVICAL 5-6, CERVICAL 6-7 WITH INSTRUMENTATION AND ALLOGRAFT;  Surgeon: Virl Grimes, MD;   Location: [facility] OR;  Service: Orthopedics;  Laterality: N/A;  ANTERIOR CERVICAL DECOMPRESSION FUSION CERVICAL 5-6, CERVICAL 6-7 WITH INSTRUMENTATION AND ALLOGRAFT   BIOPSY  06/26/2012   Procedure: BIOPSY;  Surgeon: Evander Hills, DO;  Location: [facility] ORS;  Service: General;;   CARDIAC ELECTROPHYSIOLOGY STUDY AND ABLATION  08-02-2016   [facility]   (dr Arvell Birchwood)   pulmonary veins isolation and ablation afib   CARDIAC PACEMAKER PLACEMENT  05-01-2014   [facility]   CHOLECYSTECTOMY  1983   COLONOSCOPY WITH PROPOFOL  N/A 07/13/2015   Procedure: COLONOSCOPY WITH PROPOFOL ;  Surgeon: Garrett Kallman, MD;  Location: [facility] END[facility];  Service: Endoscopy;  Laterality: N/A;   ESOPHAGECTOMY  08-21-2012   [facility]   w/ gastric pull-through   ESOPHAGOGASTRODUODENOSCOPY (EGD) WITH ESOPHAGEAL DILATION  multiple x32 per pt approx.--- last one 02-09-2015   recurrent anastomotic stricture post esophagectomy   HIATAL HERNIA REPAIR  06/26/2012   Procedure: LAPAROSCOPIC REPAIR OF HIATAL HERNIA;  Surgeon: Evander Hills, DO;  Location: [facility];  Service: General;;   INCISIONAL HERNIA REPAIR  06/26/2012   Procedure: HERNIA REPAIR INCISIONAL;  Surgeon: Evander Hills, DO;  Location: [facility];  Service: General;;   INGUINAL HERNIA REPAIR Bilateral 1982   JEJUNOSTOMY FEEDING TUBE  12/22/2012   Removed 12/ 2016  approx.   KNEE ARTHROSCOPY Left 1990  KNEE ARTHROSCOPY Right 12/2019   LAPAROSCOPIC GASTRIC SLEEVE RESECTION  06/2012   started but abandoned d/t discovery of tumor   LAPAROTOMY  07/12/2021   revision of previous gastric conduit, hiatal hernia repair   MASS EXCISION Left 04/04/2013   Procedure: EXCISION OF SCALP MASS;  Surgeon: Evander Hills, DO;  Location: [facility];  Service: General;  Laterality: Left;   pilomatrixoma   NESBIT PROCEDURE N/A 12/09/2016   Procedure: NESBIT PROCEDURE 16 DOT PLICATION;  Surgeon: Mark Ottelin, MD;  Location: [facility];  Service: Urology;  Laterality: N/A;   THORACOTOMY  07/12/2021   revision  of previous gastric conduit   TONSILLECTOMY  1959   TRANSTHORACIC ECHOCARDIOGRAM  08-06-2015   [facility]   mild LVH, ef 50%/  mild RAE /  moderate LAE/  trivial AR, MR, PR,  and TR    Current Outpatient Medications  Medication Instructions   albuterol  (VENTOLIN  HFA) 108 (90 Base) MCG/ACT inhaler 2 puffs, Inhalation, Every 6 hours PRN   Alcohol Swabs ([employer] PREP) 70 % PADS See admin instructions   apixaban  (ELIQUIS ) 5 mg, Oral, 2 times daily   budesonide -formoterol  (SYMBICORT ) 160-4.5 MCG/ACT inhaler 2 puffs, Inhalation, 2 times daily   chlorpheniramine-HYDROcodone  (TUSSIONEX) 10-8 MG/5ML 5 mLs, Oral, Every 12 hours PRN   Continuous Glucose Receiver (FREESTYLE LIBRE 3 READER) DEVI by Does not apply route.   Cyanocobalamin  (VITAMIN B 12 PO) 1,000 mg, Daily   ferrous sulfate 325 mg, Daily   FLUoxetine  (PROZAC ) 60 mg, Oral, Daily   HUMULIN  70/30 (70-30) 100 UNIT/ML injection 20 UNITS IN THE MORNING WITH BREAKFAST AND 8 UNITS IN THE EVENING WITH SUPPER.   Insulin  Syringe-Needle U-100 (BD VEO INSULIN  SYRINGE U/F) 31G X 15/64" 0.3 ML MISC Use as directed in the morning, at noon, in the evening, and at bedtime.   LORazepam  (ATIVAN ) 1 MG tablet TAKE 1 TABLET BY MOUTH 2 TIMES DAILY AS NEEDED FOR ANXIETY OR SLEEP.   metoprolol  tartrate (LOPRESSOR ) 25 mg, 2 times daily   Multiple Vitamins-Minerals (PRESERVISION AREDS PO) 1 tablet, 2 times daily   oxyCODONE -acetaminophen  (PERCOCET/ROXICET) 5-325 MG tablet 1 tablet, Oral, Every 6 hours PRN   rosuvastatin  (CRESTOR ) 40 mg, Daily   traMADol  (ULTRAM ) 50 mg, Oral, Daily PRN       Objective:   Physical Exam BP 126/80   Pulse (!) 113   Temp 97.7 F (36.5 C) (Oral)   Resp 16   Ht 6\' 1"  (1.854 m)   Wt 236 lb 4 oz (107.2 kg)   SpO2 97%   BMI 31.17 kg/m  General:   Well developed, NAD, BMI noted. HEENT:  Normocephalic . Face symmetric, atraumatic Skin: Not pale. Not jaundice Neurologic:  alert & oriented X3.  Speech normal, gait appropriate for age  and unassisted Psych--  Cognition and judgment appear intact.  Cooperative with normal attention span and concentration.  Behavior appropriate. No anxious or depressed appearing.      Assessment    Problem list: DM- per endo Mild nonproliferative diabetic retinopathy with macular edema. HTN High cholesterol Anxiety  GI: Esophageal neoplasm 2013 found during a gastric bypass  Hiatal hernia: R thoracotomy Laparotomy lysis of adhesions for repair of paraesophageal hernia in 2022  Cardiovascular: A-fib dx 2014, anticoagulated Pacemaker 2015, Ablation @ [facility] 2017 TIA 2003 ?  MRI negative, MRA some atherosclerosis distal R vertebral artery? Orthostatic hypotension CAD: Moderate-per CT coronary angiogram 01/2023, medical mngmt MSK: see surgeries, on tramadol  EtOH abuse (see OV 12/26/2023)  PLAN Right  humerus fracture: To have ORIF in 2 days Alcohol abuse: The wife reports that he drinks much more alcohol that what he has let me know.  Sometimes more than 2 bottles of wine at night.  Problem started actually late in life after he had major surgery few years ago. The patient admits that he drinks excessively and abuses alcohol. I recommend complete abstinence and seek help with AA.  Another family member is an alcoholic and they are actually familiar with AA. Also encouraged to let his anesthesiologist and surgeon know about alcohol abuse. Anxiety: Long history of anxiety, previously saw psychiatry years ago.  Currently on fluoxetine  60 mg lorazepam  twice daily. In light of EtOH abuse I am stopping lorazepam  completely, last dose of lorazepam  was about a month ago per patient  He reports he needs something else to take as needed for anxiety, I prescribed Atarax , recommend to watch for sedation. MSK: Has been on chronic tramadol , holding it due to taking oxycodone  for his recent humeral fracture.  Advised patient to stop oxycodone  ASAP due to the risk of abuse and go back to routine  tramadol .  I asked him to come back next month, at that time we will talk about weaning off tramadol  I will look into other pain treatment modalities. RTC 1 month  Time spent 30 min

## 2023-12-26 NOTE — Telephone Encounter (Signed)
   Name: Lawrence [name].  DOB: Jun 13, 1951  MRN: [id number]  Primary Cardiologist: Alexandria Angel, MD   Preoperative team, please contact this patient and set up a phone call appointment for further preoperative risk assessment. Please obtain consent and complete medication review. Thank you for your help. Ok to add on to provider schedule.   I confirm that guidance regarding antiplatelet and oral anticoagulation therapy has been completed and, if necessary, noted below.   Per office protocol, patient can hold Eliquis  for 2 days prior to procedure.  Please resume Eliquis  as soon as possible postprocedure, at the discretion of the surgeon.   I also confirmed the patient resides in the state of [place] . As per [facility] Medical Board telemedicine laws, the patient must reside in the state in which the provider is licensed.   Jude Norton, NP 12/26/2023, 8:16 AM [facility]

## 2023-12-26 NOTE — Telephone Encounter (Signed)
 Patient with diagnosis of afib on Eliquis  for anticoagulation.    Procedure:   Right Humerus Intramedullary Nail For Fracture  Date of procedure: 12/28/23   CHA2DS2-VASc Score = 6   This indicates a 9.7% annual risk of stroke. The patient's score is based upon: CHF History: 0 HTN History: 1 Diabetes History: 1 Stroke History: 2 Vascular Disease History: 1 Age Score: 1 Gender Score: 0      CrCl 66 ml/min Platelet count 187  Per office protocol, patient can hold Eliquis  for 2 days prior to procedure.    **This guidance is not considered finalized until pre-operative APP has relayed final recommendations.**

## 2023-12-26 NOTE — Patient Instructions (Signed)
 Do not take lorazepam  again  Okay to take hydroxyzine  twice daily as needed for anxiety Watch for drowsiness or excessive sedation.  For pain: Take Tylenol  as needed If the pain is severe take oxycodone . Keep in mind that you can only take 3000 mg of acetaminophen  (Tylenol ) a day. Do not take tramadol  when you take oxycodone .  Arrange a follow up visit in 1 month

## 2023-12-27 ENCOUNTER — Encounter (HOSPITAL_COMMUNITY)
Admission: RE | Admit: 2023-12-27 | Discharge: 2023-12-27 | Disposition: A | Discharge status: Home / Self Care | Source: Ambulatory Visit | Attending: Orthopedic Surgery | Admitting: Orthopedic Surgery

## 2023-12-27 ENCOUNTER — Encounter (HOSPITAL_COMMUNITY): Payer: Self-pay

## 2023-12-27 ENCOUNTER — Other Ambulatory Visit: Payer: Self-pay

## 2023-12-27 VITALS — BP 116/79 | HR 76 | Temp 98.2°F | Resp 16 | Ht 73.0 in | Wt 232.2 lb

## 2023-12-27 DIAGNOSIS — Z794 Long term (current) use of insulin: Secondary | ICD-10-CM | POA: Diagnosis not present

## 2023-12-27 DIAGNOSIS — E119 Type 2 diabetes mellitus without complications: Secondary | ICD-10-CM | POA: Insufficient documentation

## 2023-12-27 DIAGNOSIS — Z01812 Encounter for preprocedural laboratory examination: Secondary | ICD-10-CM | POA: Diagnosis not present

## 2023-12-27 DIAGNOSIS — E1165 Type 2 diabetes mellitus with hyperglycemia: Secondary | ICD-10-CM | POA: Diagnosis not present

## 2023-12-27 HISTORY — DX: Myoneural disorder, unspecified: G70.9

## 2023-12-27 LAB — GLUCOSE, CAPILLARY: Glucose-Capillary: 275 mg/dL — ABNORMAL HIGH (ref 70–99)

## 2023-12-27 NOTE — Assessment & Plan Note (Signed)
 Right humerus fracture: To have ORIF in 2 days Alcohol abuse: The wife reports that he drinks much more alcohol that what he has let me know.  Sometimes more than 2 bottles of wine at night.  Problem started actually late in life after he had major surgery few years ago. The patient admits that he drinks excessively and abuses alcohol. I recommend complete abstinence and seek help with AA.  Another family member is an alcoholic and they are actually familiar with AA. Also encouraged to let his anesthesiologist and surgeon know about alcohol abuse. Anxiety: Long history of anxiety, previously saw psychiatry years ago.  Currently on fluoxetine  60 mg lorazepam  twice daily. In light of EtOH abuse I am stopping lorazepam  completely, last dose of lorazepam  was about a month ago per patient  He reports he needs something else to take as needed for anxiety, I prescribed Atarax , recommend to watch for sedation. MSK: Has been on chronic tramadol , holding it due to taking oxycodone  for his recent humeral fracture.  Advised patient to stop oxycodone  ASAP due to the risk of abuse and go back to routine tramadol .  I asked him to come back next month, at that time we will talk about weaning off tramadol  I will look into other pain treatment modalities. RTC 1 month

## 2023-12-27 NOTE — Progress Notes (Addendum)
 PCP - Devonna Foley , MD  Cardiologist - Alexandria Angel, MD/ [facility] cardiology/  Tele clearance Marlana Silvan, NP 12-26-23 epic  PPM/ICD - PPM  Device Orders - Requested Rep Notified -  Device orders CE 08-14-23 [facility]  Chest x-ray -  EKG - 06-18-23 epic Stress Test -  ECHO - 01-18-23 epic Cardiac Cath -  LABS- BMP,CBC 12-24-23 epic Coronary CT 01-06-23 epic  Sleep Study -  CPAP -   Fasting Blood Sugar -unsure has graph on phone  Checks Blood Sugar __Libre 2 monitor ___  Blood Thinner Instructions:Eliquis    last dose 12-25-23 Aspirin Instructions:  ERAS Protcol - PRE-SURGERY G2-    COVID vaccine -yes  Activity--Able to climb a flight of stairs without CP or SOB Anesthesia review: DM A-Fib, HTN, TIA  Patient denies shortness of breath, fever, cough and chest pain at PAT appointment   All instructions explained to the patient, with a verbal understanding of the material. Patient agrees to go over the instructions while at home for a better understanding. Patient also instructed to self quarantine after being tested for COVID-19. The opportunity to ask questions was provided.

## 2023-12-28 ENCOUNTER — Encounter (HOSPITAL_COMMUNITY)
Admission: RE | Disposition: A | Discharge status: Home / Self Care | Payer: Self-pay | Source: Home / Self Care | Attending: Orthopedic Surgery

## 2023-12-28 ENCOUNTER — Ambulatory Visit (HOSPITAL_COMMUNITY)

## 2023-12-28 ENCOUNTER — Observation Stay (HOSPITAL_COMMUNITY)
Admission: RE | Admit: 2023-12-28 | Discharge: 2023-12-30 | Disposition: A | Discharge status: Home / Self Care | Attending: Orthopedic Surgery | Admitting: Orthopedic Surgery

## 2023-12-28 ENCOUNTER — Encounter (HOSPITAL_COMMUNITY): Payer: Self-pay | Admitting: Orthopedic Surgery

## 2023-12-28 DIAGNOSIS — I959 Hypotension, unspecified: Secondary | ICD-10-CM | POA: Diagnosis not present

## 2023-12-28 DIAGNOSIS — E119 Type 2 diabetes mellitus without complications: Principal | ICD-10-CM | POA: Insufficient documentation

## 2023-12-28 DIAGNOSIS — Z794 Long term (current) use of insulin: Secondary | ICD-10-CM | POA: Diagnosis not present

## 2023-12-28 DIAGNOSIS — Z8501 Personal history of malignant neoplasm of esophagus: Secondary | ICD-10-CM | POA: Diagnosis not present

## 2023-12-28 DIAGNOSIS — Z8673 Personal history of transient ischemic attack (TIA), and cerebral infarction without residual deficits: Secondary | ICD-10-CM | POA: Insufficient documentation

## 2023-12-28 DIAGNOSIS — W19XXXA Unspecified fall, initial encounter: Secondary | ICD-10-CM | POA: Diagnosis not present

## 2023-12-28 DIAGNOSIS — S42391A Other fracture of shaft of right humerus, initial encounter for closed fracture: Secondary | ICD-10-CM | POA: Diagnosis not present

## 2023-12-28 DIAGNOSIS — S42201D Unspecified fracture of upper end of right humerus, subsequent encounter for fracture with routine healing: Secondary | ICD-10-CM | POA: Diagnosis not present

## 2023-12-28 DIAGNOSIS — Z7901 Long term (current) use of anticoagulants: Secondary | ICD-10-CM | POA: Insufficient documentation

## 2023-12-28 DIAGNOSIS — Z950 Presence of cardiac pacemaker: Secondary | ICD-10-CM | POA: Insufficient documentation

## 2023-12-28 DIAGNOSIS — Z79899 Other long term (current) drug therapy: Secondary | ICD-10-CM | POA: Insufficient documentation

## 2023-12-28 DIAGNOSIS — I480 Paroxysmal atrial fibrillation: Secondary | ICD-10-CM | POA: Insufficient documentation

## 2023-12-28 DIAGNOSIS — Z981 Arthrodesis status: Secondary | ICD-10-CM | POA: Diagnosis not present

## 2023-12-28 DIAGNOSIS — I4891 Unspecified atrial fibrillation: Secondary | ICD-10-CM

## 2023-12-28 DIAGNOSIS — G8918 Other acute postprocedural pain: Secondary | ICD-10-CM | POA: Diagnosis not present

## 2023-12-28 DIAGNOSIS — Z7722 Contact with and (suspected) exposure to environmental tobacco smoke (acute) (chronic): Secondary | ICD-10-CM | POA: Diagnosis not present

## 2023-12-28 DIAGNOSIS — S42291A Other displaced fracture of upper end of right humerus, initial encounter for closed fracture: Secondary | ICD-10-CM

## 2023-12-28 DIAGNOSIS — S52691A Other fracture of lower end of right ulna, initial encounter for closed fracture: Secondary | ICD-10-CM | POA: Insufficient documentation

## 2023-12-28 DIAGNOSIS — I10 Essential (primary) hypertension: Secondary | ICD-10-CM | POA: Insufficient documentation

## 2023-12-28 DIAGNOSIS — S42309A Unspecified fracture of shaft of humerus, unspecified arm, initial encounter for closed fracture: Secondary | ICD-10-CM | POA: Diagnosis present

## 2023-12-28 DIAGNOSIS — E1142 Type 2 diabetes mellitus with diabetic polyneuropathy: Secondary | ICD-10-CM | POA: Diagnosis not present

## 2023-12-28 HISTORY — PX: HUMERUS IM NAIL: SHX1769

## 2023-12-28 LAB — GLUCOSE, CAPILLARY
Glucose-Capillary: 266 mg/dL — ABNORMAL HIGH (ref 70–99)
Glucose-Capillary: 220 mg/dL — ABNORMAL HIGH (ref 70–99)
Glucose-Capillary: 216 mg/dL — ABNORMAL HIGH (ref 70–99)
Glucose-Capillary: 205 mg/dL — ABNORMAL HIGH (ref 70–99)
Glucose-Capillary: 243 mg/dL — ABNORMAL HIGH (ref 70–99)

## 2023-12-28 SURGERY — INSERTION, INTRAMEDULLARY ROD, HUMERUS
Anesthesia: Regional | Site: Shoulder | Laterality: Right

## 2023-12-28 MED ORDER — BUPIVACAINE LIPOSOME 1.3 % IJ SUSP
INTRAMUSCULAR | Status: DC | PRN
  Administered 2023-12-28: 10 mL via PERINEURAL

## 2023-12-28 MED ORDER — ONDANSETRON HCL 4 MG PO TABS: 4.0000 mg | ORAL_TABLET | Freq: Four times a day (QID) | ORAL | Status: DC | PRN

## 2023-12-28 MED ORDER — HYDROMORPHONE HCL 1 MG/ML IJ SOLN: 0.5000 mg | INTRAMUSCULAR | Status: DC | PRN

## 2023-12-28 MED ORDER — PROPOFOL 10 MG/ML IV BOLUS
INTRAVENOUS | Status: AC
  Filled 2023-12-28: qty 20

## 2023-12-28 MED ORDER — DEXAMETHASONE SODIUM PHOSPHATE 10 MG/ML IJ SOLN
INTRAMUSCULAR | Status: DC | PRN
  Administered 2023-12-28: 5 mg via INTRAVENOUS

## 2023-12-28 MED ORDER — METHOCARBAMOL 500 MG PO TABS
500.0000 mg | ORAL_TABLET | Freq: Four times a day (QID) | ORAL | Status: DC | PRN
  Administered 2023-12-29: 500 mg via ORAL
  Filled 2023-12-28: qty 1

## 2023-12-28 MED ORDER — PHENYLEPHRINE HCL-NACL 20-0.9 MG/250ML-% IV SOLN
INTRAVENOUS | Status: DC | PRN
  Administered 2023-12-28: 25 ug/min via INTRAVENOUS
  Administered 2023-12-28: 160 ug via INTRAVENOUS
  Administered 2023-12-28: 50 ug/min via INTRAVENOUS
  Administered 2023-12-28: 160 ug via INTRAVENOUS

## 2023-12-28 MED ORDER — FENTANYL CITRATE PF 50 MCG/ML IJ SOSY
25.0000 ug | PREFILLED_SYRINGE | INTRAMUSCULAR | Status: DC | PRN
Start: 2023-12-28 — End: 2023-12-28

## 2023-12-28 MED ORDER — DEXAMETHASONE SODIUM PHOSPHATE 10 MG/ML IJ SOLN
INTRAMUSCULAR | Status: AC
  Filled 2023-12-28: qty 1

## 2023-12-28 MED ORDER — ORAL CARE MOUTH RINSE: 15.0000 mL | Freq: Once | OROMUCOSAL | Status: AC

## 2023-12-28 MED ORDER — INSULIN ASPART PROT & ASPART (70-30 MIX) 100 UNIT/ML ~~LOC~~ SUSP
20.0000 [IU] | Freq: Every day | SUBCUTANEOUS | Status: DC
  Administered 2023-12-29 – 2023-12-30 (×2): 20 [IU] via SUBCUTANEOUS
  Filled 2023-12-28: qty 10

## 2023-12-28 MED ORDER — SUGAMMADEX SODIUM 200 MG/2ML IV SOLN
INTRAVENOUS | Status: DC | PRN
  Administered 2023-12-28: 200 mg via INTRAVENOUS

## 2023-12-28 MED ORDER — ACETAMINOPHEN 500 MG PO TABS
1000.0000 mg | ORAL_TABLET | Freq: Four times a day (QID) | ORAL | Status: AC
  Administered 2023-12-28 – 2023-12-29 (×4): 1000 mg via ORAL
  Filled 2023-12-28 (×4): qty 2

## 2023-12-28 MED ORDER — FENTANYL CITRATE (PF) 100 MCG/2ML IJ SOLN
INTRAMUSCULAR | Status: AC
  Filled 2023-12-28: qty 2

## 2023-12-28 MED ORDER — PROPOFOL 10 MG/ML IV BOLUS
INTRAVENOUS | Status: DC | PRN
  Administered 2023-12-28: 120 mg via INTRAVENOUS

## 2023-12-28 MED ORDER — INSULIN ASPART PROT & ASPART (70-30 MIX) 100 UNIT/ML ~~LOC~~ SUSP
8.0000 [IU] | Freq: Every day | SUBCUTANEOUS | Status: DC
  Administered 2023-12-28 – 2023-12-29 (×2): 8 [IU] via SUBCUTANEOUS
  Filled 2023-12-28: qty 10

## 2023-12-28 MED ORDER — ONDANSETRON HCL 4 MG/2ML IJ SOLN: 4.0000 mg | Freq: Four times a day (QID) | INTRAMUSCULAR | Status: DC | PRN

## 2023-12-28 MED ORDER — CEFAZOLIN SODIUM-DEXTROSE 2-4 GM/100ML-% IV SOLN
2.0000 g | INTRAVENOUS | Status: AC
  Administered 2023-12-28: 2 g via INTRAVENOUS
  Filled 2023-12-28: qty 100

## 2023-12-28 MED ORDER — ONDANSETRON HCL 4 MG/2ML IJ SOLN
INTRAMUSCULAR | Status: AC
  Filled 2023-12-28: qty 2

## 2023-12-28 MED ORDER — CHLORHEXIDINE GLUCONATE 0.12 % MT SOLN
15.0000 mL | Freq: Once | OROMUCOSAL | Status: AC
  Administered 2023-12-28: 15 mL via OROMUCOSAL

## 2023-12-28 MED ORDER — SODIUM CHLORIDE 0.9 % IV SOLN: INTRAVENOUS | Status: DC

## 2023-12-28 MED ORDER — ACETAMINOPHEN 10 MG/ML IV SOLN: 1000.0000 mg | Freq: Once | INTRAVENOUS | Status: DC | PRN

## 2023-12-28 MED ORDER — METOPROLOL TARTRATE 25 MG PO TABS
25.0000 mg | ORAL_TABLET | Freq: Two times a day (BID) | ORAL | Status: DC
  Administered 2023-12-28 – 2023-12-29 (×2): 25 mg via ORAL
  Filled 2023-12-28 (×2): qty 1

## 2023-12-28 MED ORDER — STERILE WATER FOR IRRIGATION IR SOLN
Status: DC | PRN
  Administered 2023-12-28 (×2): 1000 mL

## 2023-12-28 MED ORDER — POLYETHYLENE GLYCOL 3350 17 G PO PACK: 17.0000 g | PACK | Freq: Every day | ORAL | Status: DC | PRN

## 2023-12-28 MED ORDER — FENTANYL CITRATE PF 50 MCG/ML IJ SOSY
50.0000 ug | PREFILLED_SYRINGE | INTRAMUSCULAR | Status: AC | PRN
  Administered 2023-12-28: 50 ug via INTRAVENOUS
  Filled 2023-12-28: qty 2

## 2023-12-28 MED ORDER — ROCURONIUM BROMIDE 100 MG/10ML IV SOLN
INTRAVENOUS | Status: DC | PRN
  Administered 2023-12-28: 60 mg via INTRAVENOUS

## 2023-12-28 MED ORDER — MIDAZOLAM HCL 2 MG/2ML IJ SOLN: 1.0000 mg | INTRAMUSCULAR | Status: DC | PRN

## 2023-12-28 MED ORDER — INSULIN ASPART 100 UNIT/ML IJ SOLN
4.0000 [IU] | Freq: Once | INTRAMUSCULAR | Status: AC
  Administered 2023-12-28: 4 [IU] via SUBCUTANEOUS

## 2023-12-28 MED ORDER — METHOCARBAMOL 1000 MG/10ML IJ SOLN: 500.0000 mg | Freq: Four times a day (QID) | INTRAMUSCULAR | Status: DC | PRN

## 2023-12-28 MED ORDER — PHENYLEPHRINE HCL (PRESSORS) 10 MG/ML IV SOLN
INTRAVENOUS | Status: DC | PRN
  Administered 2023-12-28: 160 ug via INTRAVENOUS

## 2023-12-28 MED ORDER — BISACODYL 5 MG PO TBEC: 5.0000 mg | DELAYED_RELEASE_TABLET | Freq: Every day | ORAL | Status: DC | PRN

## 2023-12-28 MED ORDER — PHENOL 1.4 % MT LIQD: 1.0000 | OROMUCOSAL | Status: DC | PRN

## 2023-12-28 MED ORDER — INSULIN ASPART 100 UNIT/ML IJ SOLN
INTRAMUSCULAR | Status: AC
  Administered 2023-12-28: 2 [IU] via SUBCUTANEOUS
  Filled 2023-12-28: qty 1

## 2023-12-28 MED ORDER — ALUM & MAG HYDROXIDE-SIMETH 200-200-20 MG/5ML PO SUSP: 30.0000 mL | ORAL | Status: DC | PRN

## 2023-12-28 MED ORDER — APIXABAN 5 MG PO TABS
5.0000 mg | ORAL_TABLET | Freq: Two times a day (BID) | ORAL | Status: DC
Start: 2023-12-29 — End: 2023-12-30
  Administered 2023-12-29 – 2023-12-30 (×3): 5 mg via ORAL
  Filled 2023-12-28 (×3): qty 1

## 2023-12-28 MED ORDER — MENTHOL 3 MG MT LOZG: 1.0000 | LOZENGE | OROMUCOSAL | Status: DC | PRN

## 2023-12-28 MED ORDER — MIDAZOLAM HCL 2 MG/2ML IJ SOLN
INTRAMUSCULAR | Status: AC
  Filled 2023-12-28: qty 2

## 2023-12-28 MED ORDER — BUPIVACAINE-EPINEPHRINE (PF) 0.25% -1:200000 IJ SOLN
INTRAMUSCULAR | Status: AC
  Filled 2023-12-28: qty 30

## 2023-12-28 MED ORDER — INSULIN ASPART 100 UNIT/ML IJ SOLN
0.0000 [IU] | Freq: Every day | INTRAMUSCULAR | Status: DC
Start: 2023-12-28 — End: 2023-12-30

## 2023-12-28 MED ORDER — DOCUSATE SODIUM 100 MG PO CAPS
100.0000 mg | ORAL_CAPSULE | Freq: Two times a day (BID) | ORAL | Status: DC
  Administered 2023-12-28 – 2023-12-30 (×4): 100 mg via ORAL
  Filled 2023-12-28 (×4): qty 1

## 2023-12-28 MED ORDER — 0.9 % SODIUM CHLORIDE (POUR BTL) OPTIME
TOPICAL | Status: DC | PRN
  Administered 2023-12-28: 1000 mL

## 2023-12-28 MED ORDER — OXYCODONE HCL 5 MG PO TABS
5.0000 mg | ORAL_TABLET | ORAL | Status: DC | PRN
  Administered 2023-12-28 (×2): 5 mg via ORAL
  Administered 2023-12-29: 10 mg via ORAL
  Administered 2023-12-29 (×2): 5 mg via ORAL
  Administered 2023-12-30 (×2): 10 mg via ORAL
  Filled 2023-12-28: qty 2
  Filled 2023-12-28 (×2): qty 1
  Filled 2023-12-28 (×2): qty 2
  Filled 2023-12-28 (×2): qty 1
  Filled 2023-12-28: qty 2

## 2023-12-28 MED ORDER — ONDANSETRON HCL 4 MG/2ML IJ SOLN
INTRAMUSCULAR | Status: DC | PRN
  Administered 2023-12-28: 4 mg via INTRAVENOUS

## 2023-12-28 MED ORDER — LACTATED RINGERS IV SOLN: INTRAVENOUS | Status: DC

## 2023-12-28 MED ORDER — FLUOXETINE HCL 20 MG PO CAPS
60.0000 mg | ORAL_CAPSULE | Freq: Every day | ORAL | Status: DC
  Administered 2023-12-28 – 2023-12-30 (×3): 60 mg via ORAL
  Filled 2023-12-28 (×3): qty 3

## 2023-12-28 MED ORDER — ACETAMINOPHEN 325 MG PO TABS: 325.0000 mg | ORAL_TABLET | Freq: Four times a day (QID) | ORAL | Status: DC | PRN

## 2023-12-28 MED ORDER — INSULIN ASPART 100 UNIT/ML IJ SOLN
0.0000 [IU] | INTRAMUSCULAR | Status: DC | PRN
  Administered 2023-12-28: 4 [IU] via SUBCUTANEOUS
  Filled 2023-12-28: qty 1

## 2023-12-28 MED ORDER — ALBUTEROL SULFATE (2.5 MG/3ML) 0.083% IN NEBU: 2.5000 mg | INHALATION_SOLUTION | Freq: Four times a day (QID) | RESPIRATORY_TRACT | Status: DC | PRN

## 2023-12-28 MED ORDER — FLEET ENEMA RE ENEM: 1.0000 | ENEMA | Freq: Once | RECTAL | Status: DC | PRN

## 2023-12-28 MED ORDER — ROCURONIUM BROMIDE 10 MG/ML (PF) SYRINGE
PREFILLED_SYRINGE | INTRAVENOUS | Status: AC
  Filled 2023-12-28: qty 10

## 2023-12-28 MED ORDER — LIDOCAINE HCL (CARDIAC) PF 100 MG/5ML IV SOSY
PREFILLED_SYRINGE | INTRAVENOUS | Status: DC | PRN
  Administered 2023-12-28: 60 mg via INTRAVENOUS

## 2023-12-28 MED ORDER — BUPIVACAINE HCL (PF) 0.5 % IJ SOLN
INTRAMUSCULAR | Status: DC | PRN
  Administered 2023-12-28: 10 mL via PERINEURAL

## 2023-12-28 MED ORDER — MOMETASONE FURO-FORMOTEROL FUM 200-5 MCG/ACT IN AERO
2.0000 | INHALATION_SPRAY | Freq: Two times a day (BID) | RESPIRATORY_TRACT | Status: DC
Start: 2023-12-28 — End: 2023-12-28

## 2023-12-28 MED ORDER — OXYCODONE HCL 5 MG PO TABS
10.0000 mg | ORAL_TABLET | ORAL | Status: DC | PRN
Start: 2023-12-28 — End: 2023-12-30
  Administered 2023-12-29 – 2023-12-30 (×2): 10 mg via ORAL
  Filled 2023-12-28: qty 2

## 2023-12-28 MED ORDER — DIPHENHYDRAMINE HCL 12.5 MG/5ML PO ELIX
12.5000 mg | ORAL_SOLUTION | ORAL | Status: DC | PRN
Start: 2023-12-28 — End: 2023-12-30
  Administered 2023-12-28 – 2023-12-29 (×2): 12.5 mg via ORAL
  Filled 2023-12-28: qty 5
  Filled 2023-12-28: qty 10

## 2023-12-28 MED ORDER — ROSUVASTATIN CALCIUM 20 MG PO TABS
40.0000 mg | ORAL_TABLET | Freq: Every day | ORAL | Status: DC
  Administered 2023-12-28 – 2023-12-29 (×2): 40 mg via ORAL
  Filled 2023-12-28 (×2): qty 2

## 2023-12-28 MED ORDER — LIDOCAINE HCL (PF) 2 % IJ SOLN
INTRAMUSCULAR | Status: AC
  Filled 2023-12-28: qty 5

## 2023-12-28 SURGICAL SUPPLY — 46 items
BAG COUNTER SPONGE SURGICOUNT (BAG) IMPLANT
BENZOIN TINCTURE PRP APPL 2/3 (GAUZE/BANDAGES/DRESSINGS) IMPLANT
BIT DRILL 10 HOLLOW (BIT) IMPLANT
BIT DRILL 3.8X270 (BIT) IMPLANT
BIT DRILL CAL 3.2 LONG (BIT) IMPLANT
BNDG ELASTIC 4INX 5YD STR LF (GAUZE/BANDAGES/DRESSINGS) IMPLANT
CHLORAPREP W/TINT 26 (MISCELLANEOUS) ×1 IMPLANT
DRAPE C-ARM 42X120 X-RAY (DRAPES) IMPLANT
DRAPE POUCH INSTRU U-SHP 10X18 (DRAPES) IMPLANT
DRAPE SHEET LG 3/4 BI-LAMINATE (DRAPES) IMPLANT
DRAPE SURG 17X23 STRL (DRAPES) ×1 IMPLANT
DRAPE SURG ORHT 6 SPLT 77X108 (DRAPES) IMPLANT
DRAPE TOP 10253 STERILE (DRAPES) IMPLANT
DRAPE U-SHAPE 47X51 STRL (DRAPES) ×1 IMPLANT
DRESSING MEPILEX FLEX 4X4 (GAUZE/BANDAGES/DRESSINGS) IMPLANT
DRSG ADAPTIC 3X8 NADH LF (GAUZE/BANDAGES/DRESSINGS) IMPLANT
ELECT BLADE TIP CTD 4 INCH (ELECTRODE) IMPLANT
ELECT PENCIL ROCKER SW 15FT (MISCELLANEOUS) IMPLANT
ELECT REM PT RETURN 15FT ADLT (MISCELLANEOUS) ×1 IMPLANT
GLOVE BIO SURGEON STRL SZ7.5 (GLOVE) ×1 IMPLANT
GLOVE BIOGEL PI IND STRL 6.5 (GLOVE) ×1 IMPLANT
GLOVE BIOGEL PI IND STRL 8 (GLOVE) ×1 IMPLANT
GLOVE SURG SS PI 6.5 STRL IVOR (GLOVE) ×1 IMPLANT
GOWN STRL REUS W/ TWL LRG LVL3 (GOWN DISPOSABLE) ×1 IMPLANT
GOWN STRL REUS W/ TWL XL LVL3 (GOWN DISPOSABLE) ×1 IMPLANT
GUIDEROD SS 2.5MMX280MM (ROD) IMPLANT
KIT BASIN OR (CUSTOM PROCEDURE TRAY) ×1 IMPLANT
KIT TURNOVER KIT A (KITS) IMPLANT
NAIL CANN MLOCK 8.5X210 RT (Nail) IMPLANT
NS IRRIG 1000ML POUR BTL (IV SOLUTION) ×1 IMPLANT
PACK SHOULDER (CUSTOM PROCEDURE TRAY) ×1 IMPLANT
PAD CAST 4YDX4 CTTN HI CHSV (CAST SUPPLIES) IMPLANT
RESTRAINT HEAD UNIVERSAL NS (MISCELLANEOUS) IMPLANT
ROD REAMING 2.5 (INSTRUMENTS) IMPLANT
SCREW LOCK 4.5X44 (Screw) IMPLANT
SCREW LOCK T25 FT 32X4X3.3X (Screw) IMPLANT
SCREW TI MULTILOC 4.5X42 (Screw) IMPLANT
SLING ARM FOAM STRAP LRG (SOFTGOODS) IMPLANT
SPLINT CAST 1 STEP 4X15 (MISCELLANEOUS) IMPLANT
SUPPORT WRAP ARM LG (MISCELLANEOUS) ×1 IMPLANT
SUT VIC AB 0 CT1 36 (SUTURE) ×1 IMPLANT
SUT VIC AB 2-0 CT1 TAPERPNT 27 (SUTURE) IMPLANT
TOWEL GREEN STERILE FF (TOWEL DISPOSABLE) IMPLANT
TOWEL OR 17X26 10 PK STRL BLUE (TOWEL DISPOSABLE) ×1 IMPLANT
TUBE SUCTION HIGH CAP CLEAR NV (SUCTIONS) IMPLANT
YANKAUER SUCT BULB TIP NO VENT (SUCTIONS) ×1 IMPLANT

## 2023-12-28 NOTE — Progress Notes (Signed)
 Orthopedic Tech Progress Note Patient Details:  Lawrence [name] 11/23/1950 [id number]  Ortho Devices Type of Ortho Device: Arm sling Ortho Device/Splint Location: right Ortho Device/Splint Interventions: Ordered, Application, Adjustment   Post Interventions Patient Tolerated: Well Instructions Provided: Adjustment of device, Care of device  [name] 12/28/2023, 3:48 PM

## 2023-12-28 NOTE — Op Note (Signed)
 Procedure(s): INSERTION, INTRAMEDULLARY ROD, HUMERUS Procedure Note  Lawrence [name]. male 73 y.o. 12/28/2023  Preoperative diagnosis: #1 right displaced angulated proximal humerus shaft fracture #2 right comminuted distal ulna fracture  Postoperative diagnosis: Same  Procedure performed: #1 intramedullary nail right humerus #2 nonoperative management right distal ulna fracture without manipulation  Surgeons and Role:    Sammye Cristal, MD - Primary   Indications:  73 y.o. male s/p fall with right displaced and angulated right proximal humerus fracture.  Indicated for surgical treatment to restore more anatomic alignment and promote healing.  He also has a nondisplaced but comminuted distal ulnar fracture which will be treated conservatively.     Surgeon: Audrene Blessing   Assistants: Sidra Dredge PA-C [name] was present and scrubbed throughout the procedure and was essential in positioning, retraction, exposure, and closure)  Anesthesia: General endotracheal anesthesia with preoperative interscalene block given by the attending anesthesiologist    Procedure Detail  INSERTION, INTRAMEDULLARY ROD, HUMERUS  Findings: Near-anatomic alignment with [employer] 210 mm x 8.5 mm humeral nail with 2 proximal and 1 distal interlocking screw.  Estimated Blood Loss:  less than 50 mL         Drains: none  Blood Given: none         Specimens: none        Complications:  * No complications entered in OR log *         Disposition: PACU - hemodynamically stable.         Condition: stable    Procedure:  DESCRIPTION OF PROCEDURE: The patient was identified in preoperative  holding area where I personally marked the operative site after  verifying site, side, and procedure with the patient. The patient was taken back  to the operating room where general anesthesia was induced without  complication and was placed in the beach-chair position with the back  elevated about  40 degrees and all extremities carefully padded and  positioned. The neck was turned very slightly away from the operative field  to assist in exposure. The right upper extremity was then prepped and  draped in a standard sterile fashion. The appropriate time-out  procedure was carried out. The patient did receive IV antibiotics  within 30 minutes of incision.  A 2 cm incision was made in [employer] centered just lateral to the [facility] joint.  Dissection was carried down through the deltoid and subdeltoid fascia.  The guidepin was placed using orthogonal fluoroscopic imaging at the superior aspect of the humeral head.  After appropriate positioning was achieved it was advanced and overreamed.  The ball-tipped guidewire was then advanced past the fracture site and the appropriate size was measured.  The 210 mm x 8.5 mm nail was then advanced over the guidewire past the fracture which improved reduction significantly.  Fluoroscopy was then used to place 2 proximal interlocking screws using the proximal jig.  Each screw site was incised and spread carefully down to bone okay to prevent any potential nerve injury.  Distally perfect circles technique was used from anterior to posterior to place 1 distal interlocking screw.  Final fluoroscopic imaging demonstrated near-anatomic alignment of the fracture with appropriate length and alignment of screws and hardware.  Copious irrigation was used and the wounds were then closed in layers with 0 Vicryl for deltoid fascial closure, 2-0 Vicryl and staples for skin closure.  Light sterile dressing was applied proximally.  Distally the patient was placed in a ulnar gutter splint. The  patient was allowed to awaken from anesthesia transferred to the stretcher and taken to the recovery room in stable condition.  Postoperative plan: He will be admitted to the hospital for pain control, observation and PT/OT.

## 2023-12-28 NOTE — Anesthesia Procedure Notes (Signed)
 Anesthesia Regional Block: Interscalene brachial plexus block   Pre-Anesthetic Checklist: , timeout performed,  Correct Patient, Correct Site, Correct Laterality,  Correct Procedure, Correct Position, site marked,  Risks and benefits discussed,  Surgical consent,  Pre-op evaluation,  At surgeon's request and post-op pain management  Laterality: Right  Prep: Dura Prep       Needles:  Injection technique: Single-shot  Needle Type: Echogenic Stimulator Needle     Needle Length: 5cm  Needle Gauge: 20     Additional Needles:   Procedures:,,,, ultrasound used (permanent image in chart),,    Narrative:  Start time: 12/28/2023 8:53 AM End time: 12/28/2023 8:57 AM Injection made incrementally with aspirations every 5 mL.  Performed by: Personally  Anesthesiologist: Micheal Agent, DO  Additional Notes: Patient identified. Risks/Benefits/Options discussed with patient including but not limited to bleeding, infection, nerve damage, failed block, incomplete pain control. Patient expressed understanding and wished to proceed. All questions were answered. Sterile technique was used throughout the entire procedure. Please see nursing notes for vital signs. Aspirated in 5cc intervals with injection for negative confirmation. Patient was given instructions on fall risk and not to get out of bed. All questions and concerns addressed with instructions to call with any issues or inadequate analgesia.

## 2023-12-28 NOTE — Plan of Care (Signed)
  Problem: Activity: Goal: Risk for activity intolerance will decrease Outcome: Progressing   Problem: Pain Managment: Goal: General experience of comfort will improve and/or be controlled Outcome: Progressing   Problem: Skin Integrity: Goal: Risk for impaired skin integrity will decrease Outcome: Progressing

## 2023-12-28 NOTE — Progress Notes (Addendum)
 8:11 AM Attempted to reach [employer] for surgery today with Dr Deeann Fare. Spoke with Cornelius Dill.  Recommendation for magnet.  Notified D. Stoltzfus who agreed with that plan.

## 2023-12-28 NOTE — H&P (Signed)
 Lawrence [name]. is an 73 y.o. male.   Chief Complaint: R proximal humerus fracture HPI: Status post fall with right proximal humerus fracture and right distal ulna comminuted fracture.  Indicated for surgical treatment of the proximal humerus fracture to improve alignment and promote anatomic healing.    Past Medical History:  Diagnosis Date   Anxiety 01/20/2023   Bradycardia    Cancer (HCC) 2013   esophageal cancer   Depression    Dysrhythmia    Afib   GERD (gastroesophageal reflux disease)    H/O hiatal hernia    s/p  repair 06-26-2012   History of esophagectomy 08-21-2012  AT [facility]   History of kidney stones 2006   History of malignant neoplasm of esophagus ONCOLOGIST-  DR D'AMICO AT [facility]-- PER LAST NOTE 01/ 2018  NO RECURRENCE   dx 10/ 2013  Stage 2A (T2 N0)  08-21-2012 s/p  esophagectomy w/ gastric pull-through [facility]) post residual recurrent anastomotic stricture (multiple EGD w/ dilations)   History of transient ischemic attack (TIA) 2003   no residual   Hypertension    Long term (current) use of anticoagulants    xarelto    Neuromuscular disorder (HCC)    neuropathy of feet   On dofetilide  therapy    PAF (paroxysmal atrial fibrillation) (HCC) first dx 2009   primary cardiologist-  dr Kay Parson /  EP cardiologist -- dr Arvell Birchwood ([facility])  s/p  afib ablation 08-02-2016   Pancreatic cyst    x2    Peyronie's disease    Presence of permanent cardiac pacemaker implanted 05-01-2014  at [facility]   Dr.Daubert -[facility] follows   Pulmonary nodule, right    upper and middle lobes --  per last CT stable   Type 2 diabetes mellitus treated with insulin  (HCC)    Wears glasses     Past Surgical History:  Procedure Laterality Date   ANTERIOR CERVICAL DECOMP/DISCECTOMY FUSION N/A 06/02/2016   Procedure: ANTERIOR CERVICAL DECOMPRESSION FUSION CERVICAL 5-6, CERVICAL 6-7 WITH INSTRUMENTATION AND ALLOGRAFT;  Surgeon: Virl Grimes, MD;  Location: [facility] OR;  Service: Orthopedics;   Laterality: N/A;  ANTERIOR CERVICAL DECOMPRESSION FUSION CERVICAL 5-6, CERVICAL 6-7 WITH INSTRUMENTATION AND ALLOGRAFT   BIOPSY  06/26/2012   Procedure: BIOPSY;  Surgeon: Evander Hills, DO;  Location: [facility] ORS;  Service: General;;   CARDIAC ELECTROPHYSIOLOGY STUDY AND ABLATION  08-02-2016   [facility]   (dr Arvell Birchwood)   pulmonary veins isolation and ablation afib   CARDIAC PACEMAKER PLACEMENT  05-01-2014   [facility]   CHOLECYSTECTOMY  1983   COLONOSCOPY WITH PROPOFOL  N/A 07/13/2015   Procedure: COLONOSCOPY WITH PROPOFOL ;  Surgeon: Garrett Kallman, MD;  Location: [facility] ENDOSCOPY;  Service: Endoscopy;  Laterality: N/A;   ESOPHAGECTOMY  08-21-2012   [facility]   w/ gastric pull-through   ESOPHAGOGASTRODUODENOSCOPY (EGD) WITH ESOPHAGEAL DILATION  multiple x32 per pt approx.--- last one 02-09-2015   recurrent anastomotic stricture post esophagectomy   HIATAL HERNIA REPAIR  06/26/2012   Procedure: LAPAROSCOPIC REPAIR OF HIATAL HERNIA;  Surgeon: Evander Hills, DO;  Location: [facility] ORS;  Service: General;;   INCISIONAL HERNIA REPAIR  06/26/2012   Procedure: HERNIA REPAIR INCISIONAL;  Surgeon: Evander Hills, DO;  Location: [facility] ORS;  Service: General;;   INGUINAL HERNIA REPAIR Bilateral 1982   JEJUNOSTOMY FEEDING TUBE  12/22/2012   Removed 12/ 2016  approx.   KNEE ARTHROSCOPY Left 1990   KNEE ARTHROSCOPY Right 12/2019   LAPAROSCOPIC GASTRIC SLEEVE RESECTION  06/2012   started but  abandoned d/t discovery of tumor   LAPAROTOMY  07/12/2021   revision of previous gastric conduit, hiatal hernia repair   MASS EXCISION Left 04/04/2013   Procedure: EXCISION OF SCALP MASS;  Surgeon: Evander Hills, DO;  Location: [facility] ORS;  Service: General;  Laterality: Left;   pilomatrixoma   NESBIT PROCEDURE N/A 12/09/2016   Procedure: NESBIT PROCEDURE 16 DOT PLICATION;  Surgeon: Mark Ottelin, MD;  Location: [facility] ;  Service: Urology;  Laterality: N/A;   THORACOTOMY  07/12/2021   revision of previous gastric conduit    TONSILLECTOMY  1959   TRANSTHORACIC ECHOCARDIOGRAM  08-06-2015   [facility]   mild LVH, ef 50%/  mild RAE /  moderate LAE/  trivial AR, MR, PR,  and TR    Family History  Problem Relation Age of Onset   Hypertension Mother    Heart failure Mother    Prostate cancer Father    Kidney cancer Father    Emphysema Father    Aortic aneurysm Father    Anorexia nervosa Sister    Depression Sister    Anorexia nervosa Sister    Heart disease Sister    Hypertension Brother    Cancer Maternal Grandmother    Heart attack Maternal Grandfather    Heart disease Maternal Grandfather    Cancer Paternal Grandmother    Alcohol abuse Paternal Grandfather    Early death Paternal Grandfather    Tuberculosis Paternal Grandfather    Colon cancer Neg Hx    Social History:  reports that he has never smoked. He has been exposed to tobacco smoke. He has never used smokeless tobacco. He reports that he does not currently use alcohol. He reports that he does not use drugs.  Allergies:  Allergies  Allergen Reactions   Chlorthalidone Other (See Comments)    urinary urgency   Lisinopril  Other (See Comments)    cough   Metformin  Hcl Other (See Comments)    fecal urgency    Medications Prior to Admission  Medication Sig Dispense Refill   apixaban  (ELIQUIS ) 5 MG TABS tablet Take 1 tablet (5 mg total) by mouth 2 (two) times daily. 180 tablet 1   budesonide -formoterol  (SYMBICORT ) 160-4.5 MCG/ACT inhaler Inhale 2 puffs into the lungs 2 (two) times daily. (Patient taking differently: Inhale 2 puffs into the lungs 2 (two) times daily as needed (Cough).) 1 each 3   Continuous Glucose Receiver (FREESTYLE LIBRE 3 READER) DEVI by Does not apply route.     Cyanocobalamin  (VITAMIN B 12 PO) Take 1,000 mg by mouth daily.     ferrous sulfate 325 (65 FE) MG tablet Take 325 mg by mouth daily.     FLUoxetine  (PROZAC ) 20 MG tablet Take 3 tablets (60 mg total) by mouth daily. 270 tablet 1   HUMULIN  70/30 (70-30) 100 UNIT/ML  injection 20 UNITS IN THE MORNING WITH BREAKFAST AND 8 UNITS IN THE EVENING WITH SUPPER. 10 mL 4   Insulin  Syringe-Needle U-100 (BD VEO INSULIN  SYRINGE U/F) 31G X 15/64" 0.3 ML MISC Use as directed in the morning, at noon, in the evening, and at bedtime. 300 each 3   metoprolol  tartrate (LOPRESSOR ) 25 MG tablet Take 25 mg by mouth 2 (two) times daily.     Multiple Vitamins-Minerals (PRESERVISION AREDS PO) Take 1 tablet by mouth in the morning and at bedtime. Preservision AREDS     oxyCODONE -acetaminophen  (PERCOCET/ROXICET) 5-325 MG tablet Take 1 tablet by mouth every 6 (six) hours as needed for severe pain (pain score  7-10). 15 tablet 0   rosuvastatin  (CRESTOR ) 40 MG tablet Take 40 mg by mouth daily.     traMADol  (ULTRAM ) 50 MG tablet Take 1 tablet (50 mg total) by mouth daily as needed for moderate pain (pain score 4-6). 30 tablet 0   albuterol  (VENTOLIN  HFA) 108 (90 Base) MCG/ACT inhaler Inhale 2 puffs into the lungs every 6 (six) hours as needed for wheezing or shortness of breath. 8 g 2   Alcohol Swabs ([employer] PREP) 70 % PADS USE AS DIRECTED 200 each 2   hydrOXYzine  (ATARAX ) 25 MG tablet Take 1 tablet (25 mg total) by mouth 2 (two) times daily as needed for anxiety (Insomnia). 40 tablet 1    Results for orders placed or performed during the hospital encounter of 12/28/23 (from the past 48 hours)  Glucose, capillary     Status: Abnormal   Collection Time: 12/28/23  7:54 AM  Result Value Ref Range   Glucose-Capillary 266 (H) 70 - 99 mg/dL    Comment: Glucose reference range applies only to samples taken after fasting for at least 8 hours.   Comment 1 Notify RN    No results found.  Review of Systems  All other systems reviewed and are negative.   Blood pressure (!) 138/94, pulse 92, temperature 98.2 F (36.8 C), temperature source Oral, resp. rate 18, height 6\' 1"  (1.854 m), weight 105.3 kg, SpO2 97%. Physical Exam Constitutional:      Appearance: He is well-developed.  HENT:     Head:  Atraumatic.  Eyes:     Extraocular Movements: Extraocular movements intact.  Cardiovascular:     Pulses: Normal pulses.  Pulmonary:     Effort: Pulmonary effort is normal.  Musculoskeletal:     Comments: RUE splinted. NVID  Skin:    General: Skin is warm and dry.  Neurological:     Mental Status: He is alert and oriented to person, place, and time.  Psychiatric:        Mood and Affect: Mood normal.      Assessment/Plan Right displaced and angulated proximal humerus fracture with right distal ulna fracture. Plan right humerus intramedullary nail and nonoperative management of right distal ulna fracture with splinting. Risks / benefits of surgery discussed Consent on chart  NPO for OR Preop antibiotics   Audrene Blessing, MD 12/28/2023, 8:49 AM

## 2023-12-28 NOTE — Anesthesia Procedure Notes (Signed)
 Procedure Name: Intubation Date/Time: 12/28/2023 9:54 AM  Performed by: Bing Buff, RNPre-anesthesia Checklist: Patient identified, Emergency Drugs available, Suction available and Patient being monitored Patient Re-evaluated:Patient Re-evaluated prior to induction Oxygen Delivery Method: Circle system utilized Preoxygenation: Pre-oxygenation with 100% oxygen Induction Type: IV induction Ventilation: Mask ventilation without difficulty Laryngoscope Size: Mac and 4 Grade View: Grade I Tube type: Oral Tube size: 7.5 mm Number of attempts: 1 Airway Equipment and Method: Stylet and Oral airway Placement Confirmation: ETT inserted through vocal cords under direct vision, positive ETCO2 and breath sounds checked- equal and bilateral Secured at: 23 cm Tube secured with: Tape Dental Injury: Teeth and Oropharynx as per pre-operative assessment

## 2023-12-28 NOTE — Discharge Instructions (Signed)
 Discharge Instructions   A sling has been provided for you. Remain in your sling at all times. This includes sleeping in your sling. Keep splint on wrist on and keep dry. Use ice on the shoulder intermittently over the first 48 hours after surgery.  Pain medicine has been prescribed for you.  Use your medicine liberally over the first 48 hours, and then you can begin to taper your use. You may take Extra Strength Tylenol  or Tylenol  only in place of the pain pills. DO NOT take ANY nonsteroidal anti-inflammatory pain medications: Advil, Motrin, Ibuprofen, Aleve, Naproxen or Naprosyn.  You may change the shoulder dressing after three days and replace with bandaids.  You may shower 5 days after surgery. The incisions CANNOT get wet prior to 5 days. Simply allow the water  to wash over the site and then pat dry. Do not rub the incisions. Make sure your axilla (armpit) is completely dry after showering.  Resume Eliquis  the day after surgery.  You may resume all vitamins and supplements after surgery   Please call [phone] during normal business hours or [phone] after hours for any problems. Including the following:  - excessive redness of the incisions - drainage for more than 4 days - fever of more than 101.5 F  *Please note that pain medications will not be refilled after hours or on weekends.

## 2023-12-28 NOTE — Transfer of Care (Signed)
 Immediate Anesthesia Transfer of Care Note  Patient: Lawrence [name].  Procedure(s) Performed: INSERTION, INTRAMEDULLARY ROD, HUMERUS (Right: Shoulder)  Patient Location: PACU  Anesthesia Type:General  Level of Consciousness: awake, alert , and oriented  Airway & Oxygen Therapy: Patient Spontanous Breathing and Patient connected to face mask oxygen  Post-op Assessment: Report given to RN and Post -op Vital signs reviewed and stable  Post vital signs: Reviewed and stable  Last Vitals:  Vitals Value Taken Time  BP 138/75 12/28/23 1151  Temp    Pulse 76 12/28/23 1159  Resp 19 12/28/23 1159  SpO2 100 % 12/28/23 1159  Vitals shown include unfiled device data.  Last Pain:  Vitals:   12/28/23 0915  TempSrc:   PainSc: 6       Patients Stated Pain Goal: 4 (12/28/23 0804)  Complications: No notable events documented.

## 2023-12-28 NOTE — Anesthesia Preprocedure Evaluation (Signed)
 Anesthesia Evaluation  Patient identified by MRN, date of birth, ID band Patient awake    Reviewed: Allergy & Precautions, NPO status , Patient's Chart, lab work & pertinent test results  Airway Mallampati: II  TM Distance: >3 FB Neck ROM: Full    Dental no notable dental hx.    Pulmonary neg pulmonary ROS   Pulmonary exam normal        Cardiovascular hypertension, Pt. on medications and Pt. on home beta blockers + dysrhythmias Atrial Fibrillation + pacemaker  Rhythm:Irregular Rate:Normal    1. Left ventricular ejection fraction, by estimation, is 55 to 60%. The left ventricle has normal function. The left ventricle has no regional wall motion abnormalities. There is mild left ventricular hypertrophy. Left ventricular diastolic parameters are consistent with Grade II diastolic dysfunction (pseudonormalization). The average left ventricular global longitudinal strain is -11.3 %. The global longitudinal strain is abnormal.  2. Right ventricular systolic function is normal. The right ventricular size is normal. There is normal pulmonary artery systolic pressure.  3. The mitral valve is normal in structure. No evidence of mitral valve regurgitation. No evidence of mitral stenosis.  4. The aortic valve is tricuspid. There is mild calcification of the aortic valve. There is mild thickening of the aortic valve. Aortic valve regurgitation is not visualized. Mild aortic valve stenosis. Aortic valve mean gradient measures 9.0 mmHg.  5. The inferior vena cava is normal in size with greater than 50% respiratory variability, suggesting right atrial pressure of 3 mmHg.    Neuro/Psych   Anxiety Depression    TIA   GI/Hepatic Neg liver ROS, hiatal hernia,GERD  ,,  Endo/Other  diabetes, Type 2, Insulin  Dependent    Renal/GU negative Renal ROS  negative genitourinary   Musculoskeletal negative musculoskeletal ROS (+)    Abdominal Normal  abdominal exam  (+)   Peds  Hematology  (+) Blood dyscrasia, anemia   Anesthesia Other Findings   Reproductive/Obstetrics                             Anesthesia Physical Anesthesia Plan  ASA: 3  Anesthesia Plan: General and Regional   Post-op Pain Management: Regional block*   Induction: Intravenous  PONV Risk Score and Plan: 2 and Ondansetron , Dexamethasone  and Treatment may vary due to age or medical condition  Airway Management Planned: Mask and Oral ETT  Additional Equipment: None  Intra-op Plan:   Post-operative Plan: Extubation in OR  Informed Consent: I have reviewed the patients History and Physical, chart, labs and discussed the procedure including the risks, benefits and alternatives for the proposed anesthesia with the patient or authorized representative who has indicated his/her understanding and acceptance.     Dental advisory given  Plan Discussed with: CRNA  Anesthesia Plan Comments:        Anesthesia Quick Evaluation

## 2023-12-29 ENCOUNTER — Other Ambulatory Visit (HOSPITAL_COMMUNITY): Payer: Self-pay

## 2023-12-29 DIAGNOSIS — E1142 Type 2 diabetes mellitus with diabetic polyneuropathy: Secondary | ICD-10-CM | POA: Diagnosis not present

## 2023-12-29 DIAGNOSIS — Z950 Presence of cardiac pacemaker: Secondary | ICD-10-CM | POA: Diagnosis not present

## 2023-12-29 DIAGNOSIS — I959 Hypotension, unspecified: Secondary | ICD-10-CM | POA: Insufficient documentation

## 2023-12-29 DIAGNOSIS — E861 Hypovolemia: Secondary | ICD-10-CM | POA: Diagnosis not present

## 2023-12-29 DIAGNOSIS — S42391A Other fracture of shaft of right humerus, initial encounter for closed fracture: Secondary | ICD-10-CM | POA: Diagnosis not present

## 2023-12-29 DIAGNOSIS — Z8501 Personal history of malignant neoplasm of esophagus: Secondary | ICD-10-CM | POA: Diagnosis not present

## 2023-12-29 DIAGNOSIS — I10 Essential (primary) hypertension: Secondary | ICD-10-CM | POA: Diagnosis not present

## 2023-12-29 DIAGNOSIS — I480 Paroxysmal atrial fibrillation: Secondary | ICD-10-CM | POA: Diagnosis not present

## 2023-12-29 DIAGNOSIS — Z981 Arthrodesis status: Secondary | ICD-10-CM | POA: Diagnosis not present

## 2023-12-29 DIAGNOSIS — S52691A Other fracture of lower end of right ulna, initial encounter for closed fracture: Secondary | ICD-10-CM | POA: Diagnosis not present

## 2023-12-29 LAB — TROPONIN I (HIGH SENSITIVITY)
Troponin I (High Sensitivity): 7 ng/L (ref <18)
Troponin I (High Sensitivity): 8 ng/L (ref <18)

## 2023-12-29 LAB — CBC
HCT: 33.7 % — ABNORMAL LOW (ref 39.0–52.0)
Hemoglobin: 10.9 g/dL — ABNORMAL LOW (ref 13.0–17.0)
MCH: 32.2 pg (ref 26.0–34.0)
MCHC: 32.3 g/dL (ref 30.0–36.0)
MCV: 99.7 fL (ref 80.0–100.0)
Platelets: 158 10*3/uL (ref 150–400)
RBC: 3.38 MIL/uL — ABNORMAL LOW (ref 4.22–5.81)
RDW: 13.1 % (ref 11.5–15.5)
WBC: 10.9 10*3/uL — ABNORMAL HIGH (ref 4.0–10.5)
nRBC: 0 % (ref 0.0–0.2)

## 2023-12-29 LAB — GLUCOSE, CAPILLARY
Glucose-Capillary: 170 mg/dL — ABNORMAL HIGH (ref 70–99)
Glucose-Capillary: 280 mg/dL — ABNORMAL HIGH (ref 70–99)
Glucose-Capillary: 80 mg/dL (ref 70–99)
Glucose-Capillary: 184 mg/dL — ABNORMAL HIGH (ref 70–99)

## 2023-12-29 MED ORDER — SODIUM CHLORIDE 0.9 % IV BOLUS
500.0000 mL | Freq: Once | INTRAVENOUS | Status: AC
  Administered 2023-12-29: 500 mL via INTRAVENOUS

## 2023-12-29 MED ORDER — LACTATED RINGERS IV SOLN: INTRAVENOUS | Status: AC

## 2023-12-29 MED ORDER — METOPROLOL TARTRATE 12.5 MG HALF TABLET
12.5000 mg | ORAL_TABLET | Freq: Two times a day (BID) | ORAL | Status: DC
  Administered 2023-12-30: 12.5 mg via ORAL
  Filled 2023-12-29: qty 1

## 2023-12-29 MED ORDER — TIZANIDINE HCL 4 MG PO TABS
4.0000 mg | ORAL_TABLET | Freq: Three times a day (TID) | ORAL | 0 refills | Status: DC | PRN
  Filled 2023-12-29: qty 30, 10d supply, fill #0

## 2023-12-29 MED ORDER — METOPROLOL TARTRATE 12.5 MG HALF TABLET: 12.5000 mg | ORAL_TABLET | Freq: Two times a day (BID) | ORAL | Status: DC

## 2023-12-29 MED ORDER — OXYCODONE HCL 5 MG PO TABS
5.0000 mg | ORAL_TABLET | ORAL | 0 refills | Status: DC | PRN
Start: 2023-12-29 — End: 2024-05-17
  Filled 2023-12-29: qty 30, 3d supply, fill #0

## 2023-12-29 MED ORDER — INSULIN ASPART 100 UNIT/ML IJ SOLN
0.0000 [IU] | Freq: Three times a day (TID) | INTRAMUSCULAR | Status: DC
  Administered 2023-12-29: 8 [IU] via SUBCUTANEOUS
  Administered 2023-12-30: 3 [IU] via SUBCUTANEOUS

## 2023-12-29 NOTE — TOC CM/SW Note (Signed)

## 2023-12-29 NOTE — Progress Notes (Signed)
 PATIENT ID: Lawrence [name].  MRN: [id number]  DOB/AGE:  72/20/1952 / 72 y.o.  1 Day Post-Op Procedure(s) (LRB): INSERTION, INTRAMEDULLARY ROD, HUMERUS (Right)  Subjective: Pain is mild.  No c/o chest pain or SOB.  Reports that he is feeling much better than yesterday. Eager to work with therapy today. No issues overnight.    Objective: Vital signs in last 24 hours: Temp:  [97.5 F (36.4 C)-97.9 F (36.6 C)] 97.6 F (36.4 C) (04/25 0558) Pulse Rate:  [74-107] 74 (04/25 0558) Resp:  [11-19] 18 (04/25 0558) BP: (120-176)/(75-163) 132/86 (04/25 0558) SpO2:  [84 %-100 %] 98 % (04/25 0558)  Intake/Output from previous day: 04/24 0701 - 04/25 0700 In: 1018.7 [I.V.:918.7; IV Piggyback:100] Out: 770 [Urine:750; Blood:20]   Physical Exam: Neurologically intact Sensation intact distally Intact pulses distally Incision: dressing C/D/I No cellulitis present No motor function yet in RUE but sensory somewhat intact Sling and splint in place  Assessment/Plan: 1 Day Post-Op Procedure(s) (LRB): INSERTION, INTRAMEDULLARY ROD, HUMERUS (Right)   Advance diet Up with therapy Discharge home today once cleared by therapy Non Weight Bearing (NWB)RUE VTE prophylaxis:  resume Eliquis   Patient with work with OT/PT today. 3-n-1 ordered. Will defer to PT/OT regarding needs for any other DME. Discharge instructions given. Follow up in office in 10-14 days.    Deyci Gesell L. [place], PA-C 12/29/2023, 8:12 AM

## 2023-12-29 NOTE — Care Management Obs Status (Signed)
 MEDICARE OBSERVATION STATUS NOTIFICATION   Patient Details  Name: Lawrence [name]. MRN: [id number] Date of Birth: [birth year]   Medicare Observation Status Notification Given:  Yes    Amaryllis Junior, LCSW 12/29/2023, 1:47 PM

## 2023-12-29 NOTE — Evaluation (Signed)
 Occupational Therapy Evaluation Patient Details Name: Lawrence [name]. MRN: [id number] DOB: [birth year] Today's Date: 12/29/2023   History of Present Illness   Pt s/p fall wtih resultant R humerus fracture and and R distal ulna fracture.  Pt is now s/p IM nailing of humerus on 12-28-23, with ulna fracture being managed non-surgically.  PMH: TIA, PAF, pacemaker, DM, cervical fusion, neuropathy in feet and ORTHOSTATIC HYPOTENSION     Clinical Impressions The pt is currently presenting below his baseline level of functioning for self-care management, as he is limited by the below listed deficits (see OT problem list). During the session today, he required SBA for supine to sit, min assist for simulated lower body dressing, CGA to stand, and CGA to ambulate into the hall. He reported a history of orthostatic hypotension. His blood pressure was taken multiple times today, and some readings included 113/64 in supine, 86/76 seated EOB, standing 159/127, and 168/85 in the chair at the end of the session (nurse made aware). He will benefit from further OT services to maximize his independence with ADLs and to facilitate his safe return home. Anticipate he will be able to return home with assist from his spouse and home health therapy.      If plan is discharge home, recommend the following:   A lot of help with walking and/or transfers;Assist for transportation;Assistance with cooking/housework;A little help with bathing/dressing/bathroom     Functional Status Assessment   Patient has had a recent decline in their functional status and demonstrates the ability to make significant improvements in function in a reasonable and predictable amount of time.     Equipment Recommendations   BSC/3in1     Recommendations for Other Services         Precautions/Restrictions   Precautions Precautions: Fall Precaution/Restrictions Comments: ORTHOSTATIC HYPOTENSION Restrictions Weight Bearing  Restrictions Per Provider Order: Yes RUE Weight Bearing Per Provider Order: Non weight bearing Other Position/Activity Restrictions: ok to perform elbow, wrist, and hand ROM, no shoulder ROM     Mobility Bed Mobility Overal bed mobility: Needs Assistance Bed Mobility: Supine to Sit     Supine to sit: Supervision, HOB elevated, Used rails          Transfers Overall transfer level: Needs assistance   Transfers: Sit to/from Stand Sit to Stand: Contact guard assist        Balance     Sitting balance-Leahy Scale: Good       Standing balance-Leahy Scale: Fair       ADL either performed or assessed with clinical judgement   ADL Overall ADL's : Needs assistance/impaired Eating/Feeding: Set up;Sitting   Grooming: Set up;Sitting           Upper Body Dressing : Moderate assistance;Sitting   Lower Body Dressing: Minimal assistance;Sitting/lateral leans   Toilet Transfer: Contact guard assist;Minimal assistance;Ambulation                    Pertinent Vitals/Pain Pain Assessment Pain Assessment: 0-10 Pain Score: 4  Pain Location: R shoulder Pain Intervention(s): Monitored during session, Limited activity within patient's tolerance, Premedicated before session     Extremity/Trunk Assessment Upper Extremity Assessment Upper Extremity Assessment: Right hand dominant;LUE deficits/detail LUE Deficits / Details: AROM and strength WFL   Lower Extremity Assessment Lower Extremity Assessment: Overall WFL for tasks assessed RLE Sensation: history of peripheral neuropathy LLE Sensation: history of peripheral neuropathy     Communication Communication Communication: No apparent difficulties   Cognition Arousal: Alert Behavior  During Therapy: WFL for tasks assessed/performed               OT - Cognition Comments: Oriented x4                 Following commands: Intact                  Home Living Family/patient expects to be  discharged to:: Private residence Living Arrangements: Spouse/significant other Available Help at Discharge: Family;Available 24 hours/day Type of Home: House Home Access: Stairs to enter [employer] of Steps: 1   Home Layout: One level     Bathroom Shower/Tub: Producer, television/film/video: Standard     Home Equipment: Agricultural consultant (2 wheels);Shower seat - built in          Prior Functioning/Environment Prior Level of Function : Independent/Modified Independent             Mobility Comments:Use of RW when out of home but furniture walking in home due to limited space.  Pt admits to 4 falls including most recent. ADLs: He was independent with ADLs and driving. His spouse managed the cooking and cleaning.       OT Problem List: Decreased range of motion;Impaired balance (sitting and/or standing);Decreased knowledge of use of DME or AE;Decreased knowledge of precautions;Pain;Impaired UE functional use   OT Treatment/Interventions: Self-care/ADL training;Therapeutic exercise;DME and/or AE instruction;Energy conservation;Therapeutic activities;Balance training;Patient/family education      OT Goals(Current goals can be found in the care plan section)   Acute Rehab OT Goals OT Goal Formulation: With patient Time For Goal Achievement: 01/12/24 Potential to Achieve Goals: Good ADL Goals Pt Will Perform Upper Body Dressing: with set-up;sitting Pt Will Perform Lower Body Dressing: with supervision;sit to/from stand;sitting/lateral leans Pt Will Transfer to Toilet: with supervision;ambulating Pt Will Perform Toileting - Clothing Manipulation and hygiene: with supervision;sit to/from stand   OT Frequency:  Min 2X/week       AM-PAC OT "6 Clicks" Daily Activity     Outcome Measure Help from another person eating meals?: None Help from another person taking care of personal grooming?: A Little Help from another person toileting, which includes using  toliet, bedpan, or urinal?: A Little Help from another person bathing (including washing, rinsing, drying)?: A Little Help from another person to put on and taking off regular upper body clothing?: A Lot Help from another person to put on and taking off regular lower body clothing?: A Little 6 Click Score: 18   End of Session Equipment Utilized During Treatment: Gait belt Nurse Communication: Mobility status  Activity Tolerance: Patient tolerated treatment well Patient left: in chair;with call bell/phone within reach;with family/visitor present  OT Visit Diagnosis: Unsteadiness on feet (R26.81);Pain;History of falling (Z91.81) Pain - Right/Left: Right Pain - part of body: Arm                Time: 9147-8295 OT Time Calculation (min): 36 min Charges:  OT General Charges $OT Visit: 1 Visit OT Evaluation $OT Eval Moderate Complexity: 1 Mod    Simone Tuckey L Rhianna Raulerson, OTR/L 12/29/2023, 1:34 PM

## 2023-12-29 NOTE — Plan of Care (Signed)
  Problem: Health Behavior/Discharge Planning: Goal: Ability to manage health-related needs will improve Outcome: Progressing   Problem: Clinical Measurements: Goal: Ability to maintain clinical measurements within normal limits will improve Outcome: Progressing Goal: Diagnostic test results will improve Outcome: Progressing Goal: Cardiovascular complication will be avoided Outcome: Progressing

## 2023-12-29 NOTE — Consult Note (Addendum)
 Initial Consultation Note   Patient: Lawrence [name]. ZOX:[id number] DOB: 26-Jun-1951 PCP: Ezell Hollow, MD DOA: 12/28/2023 DOS: the patient was seen and examined on 12/29/2023 Primary service: Sammye Cristal, MD  Referring physician: Sammye Cristal Reason for consult: hypotension  Assessment/Plan: Assessment and Plan: No notes have been filed under this hospital service. Service: Hospitalist   Assessment & Plan Humerus shaft fracture Per primary team. Hypotension Pattern as noted in HPI.  Patient has multiple apparent causes for his hypotension including previously known orthostatic hypotension also it is noted that the patient's CBC shows hemoglobin has dropped from 13 g/dL approximately 5 days ago to now 10.9.  This is consistent with soft tissue loss versus intraoperative loss of blood.  And would further explain patient's hypotension.  Patient has already been ordered for 1 L normal saline bolus infusion by primary team.  I will order another 100 cc/h of lactated Ringer  infusion to run over the next 12 hours.  We can recheck the CBC in AM and at this time reduce  the patient's metoprolol  to 12.5 bid.  Given that the patient's troponin is negative, patient is already on apixaban  for his chronic atrial fibrillation, I think alternative etiologies such as ACS CHF or VTE are highly unlikely etiologies of current presentation.  I anticipate that the patient's blood pressure will improve with hydration as above by tomorrow.  I discussed orthostatic hypotension precautions with the patient.  I further reiterated need to call staff member prior to getting out of bed. Thank you for the courtesy of this consult     [facility] will continue to follow the patient.  HPI: Lawrence [name]. is a 73 y.o. male with past medical history as listed below.  In particular, patient reports well-documented history of orthostatic hypotension.  Patient has previously taken orthostatic hypotension preventions  measures.  Unfortunately patient often has to get up at night to use the bathroom and has suffered multiple falls.  Patient actually admitted to the orthopedic service at this time after he presented yesterda scheduled right humerus operation for prior known right humeral fracture.  Patient's surgical procedure was completed yesterday at approximately 11 AM.  Blood loss is reported to be minimal.  Since then patient's blood pressure has been noted to be "low ".  See further details below.  Medical evaluation is sought.  Patient denies any chest pain shortness of breath and denies any presyncope here in the hospital.  Denies any vomiting or diarrhea or fevers or rigors.  Patient obviously has some pain in the right arm which is well-controlled prior per the patient.  Review of patient's vitals reveals that patient's lowest blood pressure was recorded as 94/67 while patient was sitting.  All the other blood pressures for which position data is available was recorded while the patient was lying down.  Review of Systems: As mentioned in the history of present illness. All other systems reviewed and are negative. Past Medical History:  Diagnosis Date   Anxiety 01/20/2023   Bradycardia    Cancer (HCC) 2013   esophageal cancer   Depression    Dysrhythmia    Afib   GERD (gastroesophageal reflux disease)    H/O hiatal hernia    s/p  repair 06-26-2012   History of esophagectomy 08-21-2012  AT [facility]   History of kidney stones 2006   History of malignant neoplasm of esophagus ONCOLOGIST-  DR D'AMICO AT [facility]-- PER LAST NOTE 01/ 2018  NO RECURRENCE   dx  10/ 2013  Stage 2A (T2 N0)  08-21-2012 s/p  esophagectomy w/ gastric pull-through [facility]) post residual recurrent anastomotic stricture (multiple EGD w/ dilations)   History of transient ischemic attack (TIA) 2003   no residual   Hypertension    Long term (current) use of anticoagulants    xarelto    Neuromuscular disorder (HCC)    neuropathy of  feet   On dofetilide  therapy    PAF (paroxysmal atrial fibrillation) (HCC) first dx 2009   primary cardiologist-  dr Kay Parson /  EP cardiologist -- dr Arvell Birchwood ([facility])  s/p  afib ablation 08-02-2016   Pancreatic cyst    x2    Peyronie's disease    Presence of permanent cardiac pacemaker implanted 05-01-2014  at [facility]   Dr.Daubert -[facility] follows   Pulmonary nodule, right    upper and middle lobes --  per last CT stable   Type 2 diabetes mellitus treated with insulin  (HCC)    Wears glasses    Past Surgical History:  Procedure Laterality Date   ANTERIOR CERVICAL DECOMP/DISCECTOMY FUSION N/A 06/02/2016   Procedure: ANTERIOR CERVICAL DECOMPRESSION FUSION CERVICAL 5-6, CERVICAL 6-7 WITH INSTRUMENTATION AND ALLOGRAFT;  Surgeon: Virl Grimes, MD;  Location: [facility] OR;  Service: Orthopedics;  Laterality: N/A;  ANTERIOR CERVICAL DECOMPRESSION FUSION CERVICAL 5-6, CERVICAL 6-7 WITH INSTRUMENTATION AND ALLOGRAFT   BIOPSY  06/26/2012   Procedure: BIOPSY;  Surgeon: Evander Hills, DO;  Location: [facility] ORS;  Service: General;;   CARDIAC ELECTROPHYSIOLOGY STUDY AND ABLATION  08-02-2016   [facility]   (dr Arvell Birchwood)   pulmonary veins isolation and ablation afib   CARDIAC PACEMAKER PLACEMENT  05-01-2014   [facility]   CHOLECYSTECTOMY  1983   COLONOSCOPY WITH PROPOFOL  N/A 07/13/2015   Procedure: COLONOSCOPY WITH PROPOFOL ;  Surgeon: Garrett Kallman, MD;  Location: [facility] ENDOSCOPY;  Service: Endoscopy;  Laterality: N/A;   ESOPHAGECTOMY  08-21-2012   [facility]   w/ gastric pull-through   ESOPHAGOGASTRODUODENOSCOPY (EGD) WITH ESOPHAGEAL DILATION  multiple x32 per pt approx.--- last one 02-09-2015   recurrent anastomotic stricture post esophagectomy   HIATAL HERNIA REPAIR  06/26/2012   Procedure: LAPAROSCOPIC REPAIR OF HIATAL HERNIA;  Surgeon: Evander Hills, DO;  Location: [facility] ORS;  Service: General;;   INCISIONAL HERNIA REPAIR  06/26/2012   Procedure: HERNIA REPAIR INCISIONAL;  Surgeon: Evander Hills, DO;  Location: [facility] ORS;   Service: General;;   INGUINAL HERNIA REPAIR Bilateral 1982   JEJUNOSTOMY FEEDING TUBE  12/22/2012   Removed 12/ 2016  approx.   KNEE ARTHROSCOPY Left 1990   KNEE ARTHROSCOPY Right 12/2019   LAPAROSCOPIC GASTRIC SLEEVE RESECTION  06/2012   started but abandoned d/t discovery of tumor   LAPAROTOMY  07/12/2021   revision of previous gastric conduit, hiatal hernia repair   MASS EXCISION Left 04/04/2013   Procedure: EXCISION OF SCALP MASS;  Surgeon: Evander Hills, DO;  Location: [facility]S;  Service: General;  Laterality: Left;   pilomatrixoma   NESBIT PROCEDURE N/A 12/09/2016   Procedure: NESBIT PROCEDURE 16 DOT PLICATION;  Surgeon: Mark Ottelin, MD;  Location: [facility];  Service: Urology;  Laterality: N/A;   THORACOTOMY  07/12/2021   revision of previous gastric conduit   TONSILLECTOMY  1959   TRANSTHORACIC ECHOCARDIOGRAM  08-06-2015   [facility]   mild LVH, ef 50%/  mild RAE /  moderate LAE/  trivial AR, MR, PR,  and TR   Social History:  reports that he has never smoked. He has been exposed to tobacco  smoke. He has never used smokeless tobacco. He reports that he does not currently use alcohol. He reports that he does not use drugs.  Allergies  Allergen Reactions   Chlorthalidone Other (See Comments)    urinary urgency   Lisinopril  Other (See Comments)    cough   Metformin  Hcl Other (See Comments)    fecal urgency    Family History  Problem Relation Age of Onset   Hypertension Mother    Heart failure Mother    Prostate cancer Father    Kidney cancer Father    Emphysema Father    Aortic aneurysm Father    Anorexia nervosa Sister    Depression Sister    Anorexia nervosa Sister    Heart disease Sister    Hypertension Brother    Cancer Maternal Grandmother    Heart attack Maternal Grandfather    Heart disease Maternal Grandfather    Cancer Paternal Grandmother    Alcohol abuse Paternal Grandfather    Early death Paternal Grandfather    Tuberculosis Paternal  Grandfather    Colon cancer Neg Hx     Prior to Admission medications   Medication Sig Start Date End Date Taking? Authorizing Provider  apixaban  (ELIQUIS ) 5 MG TABS tablet Take 1 tablet (5 mg total) by mouth 2 (two) times daily. 03/08/23  Yes Lenise Quince, MD  budesonide -formoterol  (SYMBICORT ) 160-4.5 MCG/ACT inhaler Inhale 2 puffs into the lungs 2 (two) times daily. Patient taking differently: Inhale 2 puffs into the lungs 2 (two) times daily as needed (Cough). 11/21/23  Yes Paz, Anitra Ket, MD  Continuous Glucose Receiver (FREESTYLE LIBRE 3 READER) DEVI by Does not apply route.   Yes [provider]  Cyanocobalamin  (VITAMIN B 12 PO) Take 1,000 mg by mouth daily. 05/12/16  Yes [provider]  ferrous sulfate 325 (65 FE) MG tablet Take 325 mg by mouth daily.   Yes [provider]  FLUoxetine  (PROZAC ) 20 MG tablet Take 3 tablets (60 mg total) by mouth daily. 08/02/23  Yes Paz, Volanda Gruber E, MD  HUMULIN  70/30 (70-30) 100 UNIT/ML injection 20 UNITS IN THE MORNING WITH BREAKFAST AND 8 UNITS IN THE EVENING WITH SUPPER. 12/21/23  Yes Thapa, [place], MD  Insulin  Syringe-Needle U-100 (BD VEO INSULIN  SYRINGE U/F) 31G X 15/64" 0.3 ML MISC Use as directed in the morning, at noon, in the evening, and at bedtime. 07/24/23  Yes Paz, Anitra Ket, MD  metoprolol  tartrate (LOPRESSOR ) 25 MG tablet Take 25 mg by mouth 2 (two) times daily.   Yes [provider]  Multiple Vitamins-Minerals (PRESERVISION AREDS PO) Take 1 tablet by mouth in the morning and at bedtime. Preservision AREDS   Yes [provider]  oxyCODONE -acetaminophen  (PERCOCET/ROXICET) 5-325 MG tablet Take 1 tablet by mouth every 6 (six) hours as needed for severe pain (pain score 7-10). 12/24/23  Yes Trish Furl, MD  rosuvastatin  (CRESTOR ) 40 MG tablet Take 40 mg by mouth daily. 01/25/23  Yes Paz, Anitra Ket, MD  tiZANidine  (ZANAFLEX ) 4 MG tablet Take 1 tablet (4 mg total) by mouth every 8 (eight) hours as needed. 12/29/23 12/28/24  Yes [place], Hospital doctor, PA-C  traMADol  (ULTRAM ) 50 MG tablet Take 1 tablet (50 mg total) by mouth daily as needed for moderate pain (pain score 4-6). 10/24/23  Yes Paz, Anitra Ket, MD  albuterol  (VENTOLIN  HFA) 108 [phone] Base) MCG/ACT inhaler Inhale 2 puffs into the lungs every 6 (six) hours as needed for wheezing or shortness of breath. 11/06/23   Devonna Foley  E, MD  Alcohol Swabs ([employer] PREP) 70 % PADS USE AS DIRECTED 11/01/23   Ezell Hollow, MD  hydrOXYzine  (ATARAX ) 25 MG tablet Take 1 tablet (25 mg total) by mouth 2 (two) times daily as needed for anxiety (Insomnia). 12/26/23   Paz, Jose E, MD  oxyCODONE  (OXY IR/ROXICODONE ) 5 MG immediate release tablet Take 1-2 tablets (5-10 mg total) by mouth every 4 (four) hours as needed for moderate pain (pain score 4-6) (pain score 4-6). 12/29/23   Sidra Dredge, PA-C    Physical Exam: Vitals:   12/29/23 1009 12/29/23 1010 12/29/23 1409 12/29/23 1600  BP: 94/67 116/70  106/77  Pulse: 87 75  72  Resp: 17   18  Temp: 97.8 F (36.6 C)  98.3 F (36.8 C)   TempSrc: Oral  Oral   SpO2: 95%  95% 96%  Weight:      Height:       General: Patient is alert and awake, gives a fully coherent account of his symptoms appears to be in no distress Right arm is in a sling with the forearm further braced/bandaged.  Distal function is intact No marked swelling of the right arm is appreciated by the author. Respiratory exam: Bilateral intravesically Cardiovascular exam S1-S2 normal Abdomen all quadrants are soft nontender Extremities are warm without edema. No jugular venous distention is noted. Data Reviewed:   Labs on Admission:  Results for orders placed or performed during the hospital encounter of 12/28/23 (from the past 24 hours)  Glucose, capillary     Status: Abnormal   Collection Time: 12/28/23  8:11 PM  Result Value Ref Range   Glucose-Capillary 243 (H) 70 - 99 mg/dL  Glucose, capillary     Status: Abnormal   Collection Time: 12/29/23  7:20 AM  Result Value  Ref Range   Glucose-Capillary 170 (H) 70 - 99 mg/dL  Glucose, capillary     Status: Abnormal   Collection Time: 12/29/23 12:08 PM  Result Value Ref Range   Glucose-Capillary 280 (H) 70 - 99 mg/dL  CBC     Status: Abnormal   Collection Time: 12/29/23  3:58 PM  Result Value Ref Range   WBC 10.9 (H) 4.0 - 10.5 K/uL   RBC 3.38 (L) 4.22 - 5.81 MIL/uL   Hemoglobin 10.9 (L) 13.0 - 17.0 g/dL   HCT 86.5 (L) 78.4 - 69.6 %   MCV 99.7 80.0 - 100.0 fL   MCH 32.2 26.0 - 34.0 pg   MCHC 32.3 30.0 - 36.0 g/dL   RDW 29.5 28.4 - 13.2 %   Platelets 158 150 - 400 K/uL   nRBC 0.0 0.0 - 0.2 %  Troponin I (High Sensitivity)     Status: None   Collection Time: 12/29/23  3:58 PM  Result Value Ref Range   Troponin I (High Sensitivity) 7 <18 ng/L  Glucose, capillary     Status: None   Collection Time: 12/29/23  4:32 PM  Result Value Ref Range   Glucose-Capillary 80 70 - 99 mg/dL   Basic Metabolic Panel: Recent Labs  Lab 12/24/23 2040  NA 134*  K 4.7  CL 101  CO2 19*  GLUCOSE 305*  BUN 22  CREATININE 1.29*  CALCIUM  8.4*   Liver Function Tests: No results for input(s): "AST", "ALT", "ALKPHOS", "BILITOT", "PROT", "ALBUMIN" in the last 168 hours. No results for input(s): "LIPASE", "AMYLASE" in the last 168 hours. No results for input(s): "AMMONIA" in the last 168 hours. CBC: Recent Labs  Lab  12/24/23 2040 12/29/23 1558  WBC 8.4 10.9*  HGB 13.0 10.9*  HCT 39.8 33.7*  MCV 98.3 99.7  PLT 187 158   Cardiac Enzymes: Recent Labs  Lab 12/29/23 1558  TROPONINIHS 7    BNP (last 3 results) No results for input(s): "PROBNP" in the last 8760 hours. CBG: Recent Labs  Lab 12/28/23 1618 12/28/23 2011 12/29/23 0720 12/29/23 1208 12/29/23 1632  GLUCAP 205* 243* 170* 280* 80    Radiological Exams on Admission:  DG Humerus Right Result Date: 12/28/2023 CLINICAL DATA:  Right humerus intramedullary nail. Intraoperative fluoroscopy. EXAM: RIGHT HUMERUS - 2+ VIEW COMPARISON:  Right shoulder  radiographs 01/01/2024 FINDINGS: Images were performed intraoperatively without the presence of a radiologist. New intra and nail fixating the previously seen comminuted and angulated fracture of the proximal right humeral diaphysis. Improved alignment. No hardware complication is seen. Total fluoroscopy images: 4 Total fluoroscopy time: 90 seconds Total dose: Radiation Exposure Index (as provided by the fluoroscopic device): 10.5 mGy air Kerma Please see intraoperative findings for further detail. IMPRESSION: Intraoperative fluoroscopy for right humerus intramedullary nail fixation. Electronically Signed   By: Bertina Broccoli M.D.   On: 12/28/2023 12:52   DG C-Arm 1-60 Min-No Report Result Date: 12/28/2023 Fluoroscopy was utilized by the requesting physician.  No radiographic interpretation.   DG C-Arm 1-60 Min-No Report Result Date: 12/28/2023 Fluoroscopy was utilized by the requesting physician.  No radiographic interpretation.    chest X-ray    I/O last 3 completed shifts: In: 1018.7 [I.V.:918.7; IV Piggyback:100] Out: 770 [Urine:750; Blood:20] Total I/O In: 240 [P.O.:240] Out: 200 [Urine:200]        Family Communication: per pateint. Primary team communication: discussed over phone at time of initial consultations. Furhter updated via epic chat. Thank you very much for involving us  in the care of your patient.  Author: Bennie Brave, MD 12/29/2023 6:00 PM  For on call review [email or web address].

## 2023-12-29 NOTE — Assessment & Plan Note (Addendum)
 Pattern as noted in HPI.  Patient has multiple apparent causes for his hypotension including previously known orthostatic hypotension also it is noted that the patient's CBC shows hemoglobin has dropped from 13 g/dL approximately 5 days ago to now 10.9.  This is consistent with soft tissue loss versus intraoperative loss of blood.  And would further explain patient's hypotension.  Patient has already been ordered for 1 L normal saline bolus infusion by primary team.  I will order another 100 cc/h of lactated Ringer  infusion to run over the next 12 hours.  We can recheck the CBC in AM and at this time reduce  the patient's metoprolol  to 12.5 bid.  Given that the patient's troponin is negative, patient is already on apixaban  for his chronic atrial fibrillation, I think alternative etiologies such as ACS CHF or VTE are highly unlikely etiologies of current presentation.  I anticipate that the patient's blood pressure will improve with hydration as above by tomorrow.  I discussed orthostatic hypotension precautions with the patient.  I further reiterated need to call staff member prior to getting out of bed. Thank you for the courtesy of this consult

## 2023-12-29 NOTE — Progress Notes (Signed)
 Physical Therapy Treatment Patient Details Name: Lawrence [name]. MRN: [id number] DOB: April 21, 1951 Today's Date: 12/29/2023   History of Present Illness Pt s/p fall wtih R humerus and distal ulna fx.  Pt now s/p IM nailing of humerus with ulna being managed non-surgically.  Pt wtih hx of TIA, PAF, pacemaker, DM, cervical fusion, neuropathy in feet and ORTHOSTATIC HYPOTENSION    PT Comments  Pt continues very cooperative and eager to attempt ambulation with [facility] - pt up to ambulate limited distance with noted improvement in stability but distance ltd by orthostatic hypotension.  Pt asymptomatic but BP supine 102/67; sit 88/67; sit 2 min 90/60; stand and ambulate in room 74/57 - RN aware.    If plan is discharge home, recommend the following: A little help with walking and/or transfers;A little help with bathing/dressing/bathroom;Assistance with cooking/housework;Assist for transportation;Help with stairs or ramp for entrance   Can travel by private vehicle        Equipment Recommendations  BSC/3in1;Other (comment)    Recommendations for Other Services       Precautions / Restrictions Precautions Precautions: Fall Precaution/Restrictions Comments: ORTHOSTATIC HYPOTENSION Splint/Cast - Date Prophylactic Dressing Applied (if applicable): 12/28/23 Restrictions Weight Bearing Restrictions Per Provider Order: Yes RUE Weight Bearing Per Provider Order: Non weight bearing     Mobility  Bed Mobility Overal bed mobility: Modified Independent             General bed mobility comments: up in chair and returns to same    Transfers Overall transfer level: Needs assistance Equipment used: None Transfers: Sit to/from Stand Sit to Stand: Contact guard assist           General transfer comment: Steady assist    Ambulation/Gait Ambulation/Gait assistance: Contact guard assist Gait Distance (Feet): 20 Feet Assistive device: Quad cane Gait Pattern/deviations: Step-through  pattern, Shuffle, Wide base of support, Decreased step length - right, Decreased step length - left Gait velocity: decr     General Gait Details: Improved stability noted with use of SBQC but distance ltd by BP drop   Stairs             Wheelchair Mobility     Tilt Bed    Modified Rankin (Stroke Patients Only)       Balance Overall balance assessment: Needs assistance Sitting-balance support: No upper extremity supported, Feet supported Sitting balance-Leahy Scale: Good     Standing balance support: No upper extremity supported Standing balance-Leahy Scale: Fair                              Hotel manager: No apparent difficulties  Cognition Arousal: Alert Behavior During Therapy: WFL for tasks assessed/performed   PT - Cognitive impairments: No apparent impairments                         Following commands: Intact      Cueing    Exercises      General Comments        Pertinent Vitals/Pain Pain Assessment Pain Assessment: 0-10 Pain Score: 2  Pain Location: R shoulder Pain Descriptors / Indicators: Aching Pain Intervention(s): Limited activity within patient's tolerance, Monitored during session, Premedicated before session    Home Living Family/patient expects to be discharged to:: Private residence Living Arrangements: Spouse/significant other Available Help at Discharge: Family;Available 24 hours/day Type of Home: House Home Access: Stairs to enter   [employer]  of Steps: 1   Home Layout: One level Home Equipment: Agricultural consultant (2 wheels);Shower seat      Prior Function            PT Goals (current goals can now be found in the care plan section) Acute Rehab PT Goals Patient Stated Goal: RegaiN IND PT Goal Formulation: With patient Time For Goal Achievement: 01/04/24 Potential to Achieve Goals: Good Progress towards PT goals: Progressing toward goals     Frequency    Min 5X/week      PT Plan      Co-evaluation              AM-PAC PT "6 Clicks" Mobility   Outcome Measure  Help needed turning from your back to your side while in a flat bed without using bedrails?: A Little Help needed moving from lying on your back to sitting on the side of a flat bed without using bedrails?: A Little Help needed moving to and from a bed to a chair (including a wheelchair)?: A Little Help needed standing up from a chair using your arms (e.g., wheelchair or bedside chair)?: A Little Help needed to walk in hospital room?: A Little Help needed climbing 3-5 steps with a railing? : A Little 6 Click Score: 18    End of Session Equipment Utilized During Treatment: Gait belt Activity Tolerance: Patient tolerated treatment well;Patient limited by fatigue;Other (comment) Patient left: in chair;with call bell/phone within reach;with chair alarm set;with family/visitor present Nurse Communication: Mobility status PT Visit Diagnosis: Unsteadiness on feet (R26.81);Difficulty in walking, not elsewhere classified (R26.2);Pain Pain - Right/Left: Right Pain - part of body: Shoulder     Time: 8119-1478 PT Time Calculation (min) (ACUTE ONLY): 22 min  Charges:    $Gait Training: 8-22 mins PT General Charges $$ ACUTE PT VISIT: 1 Visit                     Lawrence [name] PT Acute Rehabilitation Services Pager [phone] Office [phone]    Lawrence [name] 12/29/2023, 12:56 PM

## 2023-12-29 NOTE — Evaluation (Signed)
 Physical Therapy Evaluation Patient Details Name: Lawrence [name]. MRN: [id number] DOB: [birth year] Today's Date: 12/29/2023  History of Present Illness  Pt s/p fall wtih R humerus and distal ulna fx.  Pt now s/p IM nailing of humerus with ulna being managed non-surgically.  Pt wtih hx of TIA, PAF, pacemaker, DM, cervical fusion, neuropathy in feet and ORTHOSTATIC HYPOTENSION  Clinical Impression  Pt admitted as above and presenting with functional mobility limitations 2* decreased endurance, balance deficits, orthostatic hypotension (premorbid) and limited activity tolerance.  Pt should progress to dc home with assist of family and would benefit from follow up HHPT to maximize IND and safety at home.  This date, pt up to ambulate in halls but requiring assist for balance - partial improvement with use of SPC.  Will attempt QC after pt has chance to rest.  Pt still hopeful for dc home this date.      If plan is discharge home, recommend the following: A little help with walking and/or transfers;A little help with bathing/dressing/bathroom;Assistance with cooking/housework;Assist for transportation;Help with stairs or ramp for entrance   Can travel by private vehicle        Equipment Recommendations BSC/3in1;Other (comment) (Small base QC)  Recommendations for Other Services       Functional Status Assessment Patient has had a recent decline in their functional status and demonstrates the ability to make significant improvements in function in a reasonable and predictable amount of time.     Precautions / Restrictions Precautions Precautions: Fall Precaution/Restrictions Comments: ORTHOSTATIC HYPOTENSION Splint/Cast - Date Prophylactic Dressing Applied (if applicable): 12/28/23 Restrictions Weight Bearing Restrictions Per Provider Order: Yes      Mobility  Bed Mobility Overal bed mobility: Modified Independent             General bed mobility comments: unassisted but  utilizing bed rail    Transfers Overall transfer level: Needs assistance Equipment used: None Transfers: Sit to/from Stand Sit to Stand: Contact guard assist           General transfer comment: Steady assist    Ambulation/Gait Ambulation/Gait assistance: Min assist, Contact guard assist Gait Distance (Feet): 150 Feet Assistive device: None, Straight cane Gait Pattern/deviations: Step-through pattern, Shuffle, Wide base of support, Decreased step length - right, Decreased step length - left       General Gait Details: Pt ambulated 100' with [facility] and additional 91' with hand on hall rail.  Noted instability and high risk of falls  Stairs            Wheelchair Mobility     Tilt Bed    Modified Rankin (Stroke Patients Only)       Balance Overall balance assessment: Needs assistance Sitting-balance support: No upper extremity supported, Feet supported Sitting balance-Leahy Scale: Good     Standing balance support: No upper extremity supported Standing balance-Leahy Scale: Fair                               Pertinent Vitals/Pain Pain Assessment Pain Assessment: 0-10 Pain Score: 4  Pain Location: R shoulder Pain Descriptors / Indicators: Aching Pain Intervention(s): Limited activity within patient's tolerance, Monitored during session, Premedicated before session    Home Living Family/patient expects to be discharged to:: Private residence Living Arrangements: Spouse/significant other Available Help at Discharge: Family;Available 24 hours/day Type of Home: House Home Access: Stairs to enter   [employer] of Steps: 1   Home Layout:  One level Home Equipment: Agricultural consultant (2 wheels);Shower seat      Prior Function Prior Level of Function : Independent/Modified Independent             Mobility Comments: use of RW when out of home but furniture walking in home 2* limited space.  Pt admits to 4 falls including most recent        Extremity/Trunk Assessment   Upper Extremity Assessment Upper Extremity Assessment: Defer to OT evaluation    Lower Extremity Assessment Lower Extremity Assessment: Overall WFL for tasks assessed;RLE deficits/detail;LLE deficits/detail RLE Sensation: history of peripheral neuropathy LLE Sensation: history of peripheral neuropathy    Cervical / Trunk Assessment Cervical / Trunk Assessment: Kyphotic  Communication   Communication Communication: No apparent difficulties    Cognition Arousal: Alert Behavior During Therapy: WFL for tasks assessed/performed   PT - Cognitive impairments: No apparent impairments                         Following commands: Intact       Cueing       General Comments      Exercises     Assessment/Plan    PT Assessment Patient needs continued PT services  PT Problem List Decreased activity tolerance;Decreased balance;Decreased mobility;Decreased knowledge of use of DME;Pain       PT Treatment Interventions DME instruction;Gait training;Stair training;Functional mobility training;Therapeutic activities;Therapeutic exercise;Patient/family education;Balance training    PT Goals (Current goals can be found in the Care Plan section)  Acute Rehab PT Goals Patient Stated Goal: RegaiN IND PT Goal Formulation: With patient Time For Goal Achievement: 01/04/24 Potential to Achieve Goals: Good    Frequency Min 5X/week     Co-evaluation               AM-PAC PT "6 Clicks" Mobility  Outcome Measure Help needed turning from your back to your side while in a flat bed without using bedrails?: A Little Help needed moving from lying on your back to sitting on the side of a flat bed without using bedrails?: A Little Help needed moving to and from a bed to a chair (including a wheelchair)?: A Little Help needed standing up from a chair using your arms (e.g., wheelchair or bedside chair)?: A Little Help needed to walk in hospital  room?: A Little Help needed climbing 3-5 steps with a railing? : A Little 6 Click Score: 18    End of Session Equipment Utilized During Treatment: Gait belt Activity Tolerance: Patient tolerated treatment well;Patient limited by fatigue;Other (comment) (orthostatic) Patient left: in chair;with call bell/phone within reach;with chair alarm set;with family/visitor present Nurse Communication: Mobility status PT Visit Diagnosis: Unsteadiness on feet (R26.81);Difficulty in walking, not elsewhere classified (R26.2);Pain Pain - Right/Left: Right Pain - part of body: Shoulder    Time: 0912-0950 PT Time Calculation (min) (ACUTE ONLY): 38 min   Charges:   PT Evaluation $PT Eval Low Complexity: 1 Low PT Treatments $Gait Training: 8-22 mins PT General Charges $$ ACUTE PT VISIT: 1 Visit         Thedora Finlay PT Acute Rehabilitation Services Pager [phone] Office [phone]   [facility] 12/29/2023, 12:48 PM

## 2023-12-29 NOTE — Assessment & Plan Note (Signed)
 Per primary team

## 2023-12-29 NOTE — Plan of Care (Signed)
  Problem: Education: Goal: Knowledge of General Education information will improve Description: Including pain rating scale, medication(s)/side effects and non-pharmacologic comfort measures Outcome: Adequate for Discharge   Problem: Health Behavior/Discharge Planning: Goal: Ability to manage health-related needs will improve Outcome: Adequate for Discharge   Problem: Clinical Measurements: Goal: Ability to maintain clinical measurements within normal limits will improve Outcome: Progressing Goal: Will remain free from infection Outcome: Progressing Goal: Diagnostic test results will improve Outcome: Progressing Goal: Respiratory complications will improve Outcome: Progressing Goal: Cardiovascular complication will be avoided Outcome: Progressing   Problem: Activity: Goal: Risk for activity intolerance will decrease Outcome: Adequate for Discharge   Problem: Nutrition: Goal: Adequate nutrition will be maintained Outcome: Completed/Met   Problem: Coping: Goal: Level of anxiety will decrease Outcome: Progressing   Problem: Elimination: Goal: Will not experience complications related to bowel motility Outcome: Progressing Goal: Will not experience complications related to urinary retention Outcome: Completed/Met   Problem: Pain Managment: Goal: General experience of comfort will improve and/or be controlled Outcome: Progressing   Problem: Safety: Goal: Ability to remain free from injury will improve Outcome: Progressing   Problem: Skin Integrity: Goal: Risk for impaired skin integrity will decrease Outcome: Progressing   Problem: Activity: Goal: Ability to tolerate increased activity will improve Outcome: Progressing   Problem: Pain Management: Goal: Pain level will decrease with appropriate interventions Outcome: Progressing

## 2023-12-29 NOTE — TOC Initial Note (Addendum)
 Transition of Care (TOC) - Initial/Assessment Note    Patient Details  Name: Lawrence Min. MRN: [id number] Date of Birth: [birth year]  Transition of Care [facility]) CM/SW Contact:    Amaryllis Junior, LCSW Phone Number: 12/29/2023, 9:48 AM  Clinical Narrative:                 Pt from home with spouse. Pt continues medical workup. PT/OT eval pending. TOC following for dc needs.   Update 3:14pm- HHPT/OT setup through [facility]. 3n1 and small based quad cane ordered through [employer] to be delivered to room.   Expected Discharge Plan: Home/Self Care Barriers to Discharge: Continued Medical Work up   Patient Goals and CMS Choice Patient states their goals for this hospitalization and ongoing recovery are:: return home CMS Medicare.gov Compare Post Acute Care list provided to::  (NA) Choice offered to / list presented to : NA [place] ownership interest in [facility].provided to::  (NA)    Expected Discharge Plan and Services In-house Referral: NA     Living arrangements for the past 2 months: Single Family Home Expected Discharge Date: 12/29/23               DME Arranged: N/A DME Agency: NA       HH Arranged: NA HH Agency: NA        Prior Living Arrangements/Services Living arrangements for the past 2 months: Single Family Home Lives with:: Spouse Patient language and need for interpreter reviewed:: Yes Do you feel safe going back to the place where you live?: Yes      Need for Family Participation in Patient Care: Yes (Comment) Care giver support system in place?: Yes (comment)   Criminal Activity/Legal Involvement Pertinent to Current Situation/Hospitalization: No - Comment as needed  Activities of Daily Living   ADL Screening (condition at time of admission) Independently performs ADLs?: Yes (appropriate for developmental age) Is the patient deaf or have difficulty hearing?: No Does the patient have difficulty seeing, even when wearing  glasses/contacts?: No Does the patient have difficulty concentrating, remembering, or making decisions?: No  Permission Sought/Granted                  Emotional Assessment Appearance:: Appears stated age Attitude/Demeanor/Rapport: Engaged Affect (typically observed): Accepting Orientation: : Oriented to Self, Oriented to Place, Oriented to  Time, Oriented to Situation Alcohol / Substance Use: Not Applicable Psych Involvement: No (comment)  Admission diagnosis:  Closed fracture of head of right humerus, initial encounter [S42.291A] Humerus shaft fracture [S42.309A] Patient Active Problem List   Diagnosis Date Noted   Humerus shaft fracture 12/28/2023   Gross hematuria 05/02/2023   Annual physical exam 04/09/2023   PCP NOTES >>>>>>>>>>>>>>> 02/08/2023   Ventricular hemorrhage (HCC) 01/20/2023   BPH (benign prostatic hyperplasia) 01/20/2023   Anxiety 01/20/2023   MCI (mild cognitive impairment) 01/20/2023   IBS (irritable bowel syndrome) 01/20/2023   GERD (gastroesophageal reflux disease) 01/20/2023   Macular degeneration 01/20/2023   Sick sinus syndrome (HCC) 01/20/2023   Anemia 07/13/2021   Chronic cough 02/14/2020   Abnormal CT of the chest 02/14/2020   Radiculopathy 06/02/2016   On dofetilide  therapy 03/24/2016   DDD Pacemaker 12/09/2015   Chronic anticoagulation 08/06/2013   Esophageal stenosis 05/02/2013   Dislodged jejunostomy tube 02/24/2013   Malfunction of jejunostomy tube (HCC) 02/06/2013   Moderate protein-calorie malnutrition (HCC) 09/07/2012   Diabetes mellitus type II, controlled (HCC) 06/28/2012    Class: Chronic   Hypertension, benign  06/28/2012    Class: Chronic   Atrial fibrillation (HCC) 06/28/2012    Class: Chronic   Esophageal cancer (HCC) 06/28/2012    Class: Acute   TIA (transient ischemic attack) 06/28/2012   Hyperlipidemia 06/28/2012    Class: Chronic   PCP:  Ezell Hollow, MD Pharmacy:   [facility] [address] [place] - [address] [place] [address] Phone: [phone] Fax: [phone]  [facility] [address] [place] [address] Phone: [phone] Fax: [phone]     Social Drivers of Health (SDOH) Social History: SDOH Screenings   Food Insecurity: Patient Declined (12/28/2023)  Housing: Patient Declined (12/28/2023)  Transportation Needs: No Transportation Needs (12/28/2023)  Utilities: Not At Risk (12/28/2023)  Alcohol Screen: Low Risk  (11/06/2023)  Depression (PHQ2-9): Medium Risk (11/06/2023)  Financial Resource Strain: Low Risk  (11/06/2023)  Physical Activity: Inactive (11/06/2023)  Social Connections: Unknown (12/28/2023)  Stress: Stress Concern Present (11/06/2023)  Tobacco Use: Low Risk  (12/28/2023)  Health Literacy: Adequate Health Literacy (03/27/2023)   SDOH Interventions:     Readmission Risk Interventions     No data to display

## 2023-12-30 ENCOUNTER — Other Ambulatory Visit (HOSPITAL_COMMUNITY): Payer: Self-pay

## 2023-12-30 DIAGNOSIS — Z794 Long term (current) use of insulin: Secondary | ICD-10-CM

## 2023-12-30 DIAGNOSIS — I482 Chronic atrial fibrillation, unspecified: Secondary | ICD-10-CM | POA: Diagnosis not present

## 2023-12-30 DIAGNOSIS — E1142 Type 2 diabetes mellitus with diabetic polyneuropathy: Secondary | ICD-10-CM | POA: Diagnosis not present

## 2023-12-30 DIAGNOSIS — I951 Orthostatic hypotension: Secondary | ICD-10-CM

## 2023-12-30 DIAGNOSIS — I10 Essential (primary) hypertension: Secondary | ICD-10-CM | POA: Diagnosis not present

## 2023-12-30 DIAGNOSIS — Z8501 Personal history of malignant neoplasm of esophagus: Secondary | ICD-10-CM | POA: Diagnosis not present

## 2023-12-30 DIAGNOSIS — E119 Type 2 diabetes mellitus without complications: Secondary | ICD-10-CM | POA: Diagnosis not present

## 2023-12-30 DIAGNOSIS — Z950 Presence of cardiac pacemaker: Secondary | ICD-10-CM | POA: Diagnosis not present

## 2023-12-30 DIAGNOSIS — I480 Paroxysmal atrial fibrillation: Secondary | ICD-10-CM | POA: Diagnosis not present

## 2023-12-30 DIAGNOSIS — I959 Hypotension, unspecified: Secondary | ICD-10-CM | POA: Diagnosis not present

## 2023-12-30 DIAGNOSIS — Z981 Arthrodesis status: Secondary | ICD-10-CM | POA: Diagnosis not present

## 2023-12-30 DIAGNOSIS — S42391A Other fracture of shaft of right humerus, initial encounter for closed fracture: Secondary | ICD-10-CM | POA: Diagnosis not present

## 2023-12-30 DIAGNOSIS — S52691A Other fracture of lower end of right ulna, initial encounter for closed fracture: Secondary | ICD-10-CM | POA: Diagnosis not present

## 2023-12-30 LAB — CBC
HCT: 32.4 % — ABNORMAL LOW (ref 39.0–52.0)
Hemoglobin: 10.3 g/dL — ABNORMAL LOW (ref 13.0–17.0)
MCH: 32.1 pg (ref 26.0–34.0)
MCHC: 31.8 g/dL (ref 30.0–36.0)
MCV: 100.9 fL — ABNORMAL HIGH (ref 80.0–100.0)
Platelets: 138 10*3/uL — ABNORMAL LOW (ref 150–400)
RBC: 3.21 MIL/uL — ABNORMAL LOW (ref 4.22–5.81)
RDW: 13.2 % (ref 11.5–15.5)
WBC: 7.6 10*3/uL (ref 4.0–10.5)
nRBC: 0 % (ref 0.0–0.2)

## 2023-12-30 LAB — GLUCOSE, CAPILLARY: Glucose-Capillary: 156 mg/dL — ABNORMAL HIGH (ref 70–99)

## 2023-12-30 MED ORDER — METOPROLOL TARTRATE 12.5 MG HALF TABLET
12.5000 mg | ORAL_TABLET | Freq: Once | ORAL | Status: AC
  Administered 2023-12-30: 12.5 mg via ORAL
  Filled 2023-12-30: qty 1

## 2023-12-30 NOTE — Discharge Summary (Signed)
 Patient ID: Lawrence [name]. MRN: [id number] DOB/AGE: [birth year] 73 y.o.  Admit date: 12/28/2023 Discharge date: 12/30/2023  Admission Diagnoses:  Principal Problem:   Humerus shaft fracture Active Problems:   Hypotension   Discharge Diagnoses:  Same  Past Medical History:  Diagnosis Date   Anxiety 01/20/2023   Bradycardia    Cancer (HCC) 2013   esophageal cancer   Depression    Dysrhythmia    Afib   GERD (gastroesophageal reflux disease)    H/O hiatal hernia    s/p  repair 06-26-2012   History of esophagectomy 08-21-2012  AT [facility]   History of kidney stones 2006   History of malignant neoplasm of esophagus ONCOLOGIST-  DR D'AMICO AT [facility]-- PER LAST NOTE 01/ 2018  NO RECURRENCE   dx 10/ 2013  Stage 2A (T2 N0)  08-21-2012 s/p  esophagectomy w/ gastric pull-through [facility]) post residual recurrent anastomotic stricture (multiple EGD w/ dilations)   History of transient ischemic attack (TIA) 2003   no residual   Hypertension    Long term (current) use of anticoagulants    xarelto    Neuromuscular disorder (HCC)    neuropathy of feet   On dofetilide  therapy    PAF (paroxysmal atrial fibrillation) (HCC) first dx 2009   primary cardiologist-  dr Kay Parson /  EP cardiologist -- dr Arvell Birchwood ([facility])  s/p  afib ablation 08-02-2016   Pancreatic cyst    x2    Peyronie's disease    Presence of permanent cardiac pacemaker implanted 05-01-2014  at [facility]   Dr.Daubert -[facility] follows   Pulmonary nodule, right    upper and middle lobes --  per last CT stable   Type 2 diabetes mellitus treated with insulin  (HCC)    Wears glasses     Surgeries: Procedure(s): INSERTION, INTRAMEDULLARY ROD, HUMERUS on 12/28/2023   Consultants:   Discharged Condition: Improved  Hospital Course: Lawrence [name]. is an 73 y.o. male who was admitted 12/28/2023 for operative treatment ofHumerus shaft fracture. Patient has severe unremitting pain that affects sleep, daily activities, and  work/hobbies. After pre-op clearance the patient was taken to the operating room on 12/28/2023 and underwent  Procedure(s): INSERTION, INTRAMEDULLARY ROD, HUMERUS.    Patient was given perioperative antibiotics:  Anti-infectives (From admission, onward)    Start     Dose/Rate Route Frequency Ordered Stop   12/28/23 0745  ceFAZolin  (ANCEF ) IVPB 2g/100 mL premix        2 g 200 mL/hr over 30 Minutes Intravenous On call to O.R. 12/28/23 0730 12/28/23 1015        Patient was given sequential compression devices, early ambulation, and chemoprophylaxis to prevent DVT.  Patient had postoperative hypotension. IV fluids were given and medicine team was consulted. His home BP meds were adjusted and his hypotension improved.  Patient benefited maximally from hospital stay and there were no complications.    Recent vital signs: Patient Vitals for the past 24 hrs:  BP Temp Temp src Pulse Resp SpO2  12/30/23 0532 137/82 98.6 F (37 C) Oral 90 16 92 %  12/30/23 0237 131/88 -- -- 77 -- --  12/29/23 2100 115/66 -- -- -- -- --  12/29/23 2029 (!) 93/53 97.9 F (36.6 C) Oral 86 17 94 %  12/29/23 1600 106/77 -- -- 72 18 96 %  12/29/23 1409 -- 98.3 F (36.8 C) Oral -- -- 95 %  12/29/23 1010 116/70 -- -- 75 -- --  12/29/23 1009 94/67 97.8  F (36.6 C) Oral 87 17 95 %     Recent laboratory studies:  Recent Labs    12/29/23 1558 12/30/23 0331  WBC 10.9* 7.6  HGB 10.9* 10.3*  HCT 33.7* 32.4*  PLT 158 138*     Discharge Medications:   Allergies as of 12/30/2023       Reactions   Chlorthalidone Other (See Comments)   urinary urgency   Lisinopril  Other (See Comments)   cough   Metformin  Hcl Other (See Comments)   fecal urgency        Medication List     STOP taking these medications    oxyCODONE -acetaminophen  5-325 MG tablet Commonly known as: PERCOCET/ROXICET   traMADol  50 MG tablet Commonly known as: ULTRAM    VITAMIN B 12 PO       TAKE these medications    albuterol   108 (90 Base) MCG/ACT inhaler Commonly known as: VENTOLIN  HFA Inhale 2 puffs into the lungs every 6 (six) hours as needed for wheezing or shortness of breath.   apixaban  5 MG Tabs tablet Commonly known as: Eliquis  Take 1 tablet (5 mg total) by mouth 2 (two) times daily.   budesonide -formoterol  160-4.5 MCG/ACT inhaler Commonly known as: SYMBICORT  Inhale 2 puffs into the lungs 2 (two) times daily. What changed:  when to take this reasons to take this   [employer] Prep 70 % Pads USE AS DIRECTED   Droplet Insulin  Syringe 31G X 15/64" 0.3 ML Misc Generic drug: Insulin  Syringe-Needle U-100 Use as directed in the morning, at noon, in the evening, and at bedtime.   ferrous sulfate 325 (65 FE) MG tablet Take 325 mg by mouth daily.   FLUoxetine  20 MG tablet Commonly known as: PROZAC  Take 3 tablets (60 mg total) by mouth daily.   FreeStyle Libre 3 Reader [place] by Does not apply route.   HumuLIN  70/30 (70-30) 100 UNIT/ML injection Generic drug: insulin  NPH-regular Human 20 UNITS IN THE MORNING WITH BREAKFAST AND 8 UNITS IN THE EVENING WITH SUPPER.   hydrOXYzine  25 MG tablet Commonly known as: ATARAX  Take 1 tablet (25 mg total) by mouth 2 (two) times daily as needed for anxiety (Insomnia).   metoprolol  tartrate 25 MG tablet Commonly known as: LOPRESSOR  Take 25 mg by mouth 2 (two) times daily.   oxyCODONE  5 MG immediate release tablet Commonly known as: Oxy IR/ROXICODONE  Take 1-2 tablets (5-10 mg total) by mouth every 4 (four) hours as needed for moderate pain (pain score 4-6) (pain score 4-6).   PRESERVISION AREDS PO Take 1 tablet by mouth in the morning and at bedtime. Preservision AREDS   rosuvastatin  40 MG tablet Commonly known as: CRESTOR  Take 40 mg by mouth daily.   tiZANidine  4 MG tablet Commonly known as: Zanaflex  Take 1 tablet (4 mg total) by mouth every 8 (eight) hours as needed.               Durable Medical Equipment  (From admission, onward)            Start     Ordered   12/29/23 1401  For home use only DME Cane  Once       Comments: Small base quad cane   12/29/23 1401   12/29/23 1350  For home use only DME 3 n 1  Once       Comments: Patient is not safe to walk the distance required to go the bathroom.  A 3in1 BSC will alleviate this problem .   12/29/23 1350  12/29/23 0809  For home use only DME 3 n 1  Once        12/29/23 7846            Diagnostic Studies: DG Humerus Right Result Date: 12/28/2023 CLINICAL DATA:  Right humerus intramedullary nail. Intraoperative fluoroscopy. EXAM: RIGHT HUMERUS - 2+ VIEW COMPARISON:  Right shoulder radiographs 01/01/2024 FINDINGS: Images were performed intraoperatively without the presence of a radiologist. New intra and nail fixating the previously seen comminuted and angulated fracture of the proximal right humeral diaphysis. Improved alignment. No hardware complication is seen. Total fluoroscopy images: 4 Total fluoroscopy time: 90 seconds Total dose: Radiation Exposure Index (as provided by the fluoroscopic device): 10.5 mGy air Kerma Please see intraoperative findings for further detail. IMPRESSION: Intraoperative fluoroscopy for right humerus intramedullary nail fixation. Electronically Signed   By: Bertina Broccoli M.D.   On: 12/28/2023 12:52   DG C-Arm 1-60 Min-No Report Result Date: 12/28/2023 Fluoroscopy was utilized by the requesting physician.  No radiographic interpretation.   DG C-Arm 1-60 Min-No Report Result Date: 12/28/2023 Fluoroscopy was utilized by the requesting physician.  No radiographic interpretation.   DG Elbow Complete Right Result Date: 12/24/2023 CLINICAL DATA:  Pain after fall EXAM: RIGHT ELBOW - COMPLETE 3+ VIEW; RIGHT FOREARM - 2 VIEW COMPARISON:  None Available. FINDINGS: Elbow: No definite fracture or dislocation in the elbow noting decreased sensitivity due to unusual obliquities no elbow joint effusion. Soft tissues are unremarkable. Forearm: Comminuted minimally  displaced fracture of the distal right ulnar metadiaphysis. Fracture lines extend into the distal radioulnar joint. IMPRESSION: Comminuted fracture of the distal right ulna. No definite fracture or dislocation in the elbow. Electronically Signed   By: Rozell Cornet M.D.   On: 12/24/2023 22:48   DG Forearm Right Result Date: 12/24/2023 CLINICAL DATA:  Pain after fall EXAM: RIGHT ELBOW - COMPLETE 3+ VIEW; RIGHT FOREARM - 2 VIEW COMPARISON:  None Available. FINDINGS: Elbow: No definite fracture or dislocation in the elbow noting decreased sensitivity due to unusual obliquities no elbow joint effusion. Soft tissues are unremarkable. Forearm: Comminuted minimally displaced fracture of the distal right ulnar metadiaphysis. Fracture lines extend into the distal radioulnar joint. IMPRESSION: Comminuted fracture of the distal right ulna. No definite fracture or dislocation in the elbow. Electronically Signed   By: Rozell Cornet M.D.   On: 12/24/2023 22:48   DG Shoulder Right Result Date: 12/24/2023 CLINICAL DATA:  Pain after fall EXAM: RIGHT SHOULDER - 2+ VIEW COMPARISON:  None Available. FINDINGS: Acute comminuted fracture of the proximal right humeral diaphysis. There is posterior displacement and apex posterior angulation of the dominant distal fragment. Fracture lines extend to the surgical neck of the humerus. No evidence of extension into the glenohumeral joint. No dislocation. IMPRESSION: Acute comminuted angulated fracture of the proximal humeral diaphysis. Electronically Signed   By: Rozell Cornet M.D.   On: 12/24/2023 22:45    Disposition: Discharge disposition: 01-Home or Self Care       Discharge Instructions     Call MD / Call 911   Complete by: As directed    If you experience chest pain or shortness of breath, CALL 911 and be transported to the hospital emergency room.  If you develope a fever above 101 F, pus (white drainage) or increased drainage or redness at the wound, or calf  pain, call your surgeon's office.   Constipation Prevention   Complete by: As directed    Drink plenty of fluids.  Prune juice may be  helpful.  You may use a stool softener, such as Colace (over the counter) 100 mg twice a day.  Use MiraLax  (over the counter) for constipation as needed.   Diet - low sodium heart healthy   Complete by: As directed    Increase activity slowly as tolerated   Complete by: As directed    Post-operative opioid taper instructions:   Complete by: As directed    POST-OPERATIVE OPIOID TAPER INSTRUCTIONS: It is important to wean off of your opioid medication as soon as possible. If you do not need pain medication after your surgery it is ok to stop day one. Opioids include: Codeine, Hydrocodone (Norco, Vicodin), Oxycodone (Percocet, oxycontin ) and hydromorphone  amongst others.  Long term and even short term use of opiods can cause: Increased pain response Dependence Constipation Depression Respiratory depression And more.  Withdrawal symptoms can include Flu like symptoms Nausea, vomiting And more Techniques to manage these symptoms Hydrate well Eat regular healthy meals Stay active Use relaxation techniques(deep breathing, meditating, yoga) Do Not substitute Alcohol to help with tapering If you have been on opioids for less than two weeks and do not have pain than it is ok to stop all together.  Plan to wean off of opioids This plan should start within one week post op of your joint replacement. Maintain the same interval or time between taking each dose and first decrease the dose.  Cut the total daily intake of opioids by one tablet each day Next start to increase the time between doses. The last dose that should be eliminated is the evening dose.           Follow-up Information     [place], [facility], PA-C. Schedule an appointment as soon as possible for a visit on 01/08/2024.   Specialty: Orthopedic Surgery Contact information: [address] [place] [address] [phone]         Care, [facility] Follow up.   Specialty: Home Health Services Contact information: [address] [place] [address] [phone]                  Signed: Geraldina Klinefelter Leeandre Nordling 12/30/2023, 8:38 AM

## 2023-12-30 NOTE — Hospital Course (Addendum)
 73 year old man PMH orthostatic hypotension who was admitted by orthopedics for elective repair of right humerus fracture.  Postoperatively noted to be hypotensive and hospitalist was consulted.  Working diagnosis chronic orthostatic hypotension.

## 2023-12-30 NOTE — Anesthesia Postprocedure Evaluation (Signed)
 Anesthesia Post Note  Patient: Lawrence [name].  Procedure(s) Performed: INSERTION, INTRAMEDULLARY ROD, HUMERUS (Right: Shoulder)     Patient location during evaluation: PACU Anesthesia Type: Regional and General Level of consciousness: awake and alert Pain management: pain level controlled Vital Signs Assessment: post-procedure vital signs reviewed and stable Respiratory status: spontaneous breathing, nonlabored ventilation, respiratory function stable and patient connected to nasal cannula oxygen Cardiovascular status: blood pressure returned to baseline and stable Postop Assessment: no apparent nausea or vomiting Anesthetic complications: no   No notable events documented.  Last Vitals:  Vitals:   12/30/23 0237 12/30/23 0532  BP: 131/88 137/82  Pulse: 77 90  Resp:  16  Temp:  37 C  SpO2:  92%    Last Pain:  Vitals:   12/30/23 0637  TempSrc:   PainSc: Asleep                 Valente Gaskin Rubbie Goostree

## 2023-12-30 NOTE — Progress Notes (Addendum)
  Progress Note   Patient: Lawrence [name]. BJY:[id number] DOB: Jan 31, 1951 DOA: 12/28/2023     0 DOS: the patient was seen and examined on 12/30/2023   Brief hospital course: 73 year old man PMH orthostatic hypotension who was admitted by orthopedics for elective repair of right humerus fracture.  Postoperatively noted to be hypotensive and hospitalist was consulted.  Working diagnosis chronic orthostatic hypotension.  Assessment and Plan: * Humerus shaft fracture Per orthopedics  Orthostatic hypotension  Blood pressure orthostatics negative, positive for tachycardia but patient was only given half dose of usual metoprolol . Will resume full dose of metoprolol .  He looks stable.  Atrial fibrillation Continue beta-blocker, apixaban   Diabetes mellitus type 2 Stable.  Continue insulin .  Appears stable postoperatively for discharge from medical perspective.  Will sign off.  Please call or send secure chat message with any questions.      Subjective:  Feels good today  Physical Exam: Vitals:   12/30/23 0237 12/30/23 0532 12/30/23 0948 12/30/23 1005  BP: 131/88 137/82 132/72   Pulse: 77 90 (!) 120 96  Resp:  16 16   Temp:  98.6 F (37 C)    TempSrc:  Oral    SpO2:  92% 96%   Weight:      Height:       Physical Exam Vitals reviewed.  Constitutional:      General: He is not in acute distress.    Appearance: He is not ill-appearing or toxic-appearing.  Cardiovascular:     Rate and Rhythm: Normal rate and regular rhythm.     Heart sounds: No murmur heard. Pulmonary:     Effort: Pulmonary effort is normal. No respiratory distress.     Breath sounds: No wheezing, rhonchi or rales.  Neurological:     Mental Status: He is alert.  Psychiatric:        Mood and Affect: Mood normal.        Behavior: Behavior normal.     Data Reviewed: Hemoglobin stable 10.3  Family Communication: Wife at bedside  Disposition: Status is: Observation      Time spent: 20  minutes  Author: Jerline Moon, MD 12/30/2023 10:30 AM  For on call review [email or web address].

## 2023-12-30 NOTE — Progress Notes (Signed)
 Physical Therapy Treatment Patient Details Name: Lawrence [name]. MRN: [id number] DOB: 1951-07-17 Today's Date: 12/30/2023   History of Present Illness Pt s/p fall wtih R humerus and distal ulna fx.  Pt now s/p IM nailing of humerus with ulna being managed non-surgically.  Pt wtih hx of TIA, PAF, pacemaker, DM, cervical fusion, neuropathy in feet and ORTHOSTATIC HYPOTENSION    PT Comments  Pt very cooperative and with noted improvement in stability but requiring increased time for all tasks and fatigues easily.  Pt up to EOB sitting, to standing to use urinal and ambulated limited distance in hall.  BP supine 115/74; sit 115/73; stand 92/61; after walking short distance 101/64 - pt reports no dizziness but HR elevated to 149.  RN present for session.  Pt hopeful for return home this date.    If plan is discharge home, recommend the following: A little help with walking and/or transfers;A little help with bathing/dressing/bathroom;Assistance with cooking/housework;Assist for transportation;Help with stairs or ramp for entrance   Can travel by private vehicle        Equipment Recommendations  BSC/3in1;Other (comment)    Recommendations for Other Services       Precautions / Restrictions Precautions Precautions: Fall Splint/Cast - Date Prophylactic Dressing Applied (if applicable): 12/28/23 Restrictions Weight Bearing Restrictions Per Provider Order: Yes RUE Weight Bearing Per Provider Order: Non weight bearing Other Position/Activity Restrictions: ok to perform elbow, wrist, and hand ROM, no shoulder ROM     Mobility  Bed Mobility Overal bed mobility: Needs Assistance Bed Mobility: Supine to Sit     Supine to sit: Supervision, HOB elevated, Used rails     General bed mobility comments: Increased time with use of bed rail    Transfers Overall transfer level: Needs assistance Equipment used: None Transfers: Sit to/from Stand Sit to Stand: Contact guard assist            General transfer comment: Steady assist    Ambulation/Gait Ambulation/Gait assistance: Contact guard assist Gait Distance (Feet): 34 Feet Assistive device: Quad cane Gait Pattern/deviations: Step-to pattern, Decreased step length - right, Decreased step length - left, Shuffle, Trunk flexed Gait velocity: decr     General Gait Details: Improved stability noted with use of SBQC but distance ltd by fatigue   Stairs             Wheelchair Mobility     Tilt Bed    Modified Rankin (Stroke Patients Only)       Balance Overall balance assessment: Needs assistance Sitting-balance support: No upper extremity supported, Feet supported Sitting balance-Leahy Scale: Good     Standing balance support: No upper extremity supported Standing balance-Leahy Scale: Fair                              Hotel manager: No apparent difficulties  Cognition Arousal: Alert Behavior During Therapy: WFL for tasks assessed/performed   PT - Cognitive impairments: No apparent impairments                         Following commands: Intact      Cueing    Exercises      General Comments        Pertinent Vitals/Pain Pain Assessment Pain Assessment: 0-10 Pain Score: 6  Pain Location: R shoulder Pain Descriptors / Indicators: Aching Pain Intervention(s): Limited activity within patient's tolerance, Monitored during session, Premedicated before session  Home Living                          Prior Function            PT Goals (current goals can now be found in the care plan section) Acute Rehab PT Goals Patient Stated Goal: RegaiN IND PT Goal Formulation: With patient Time For Goal Achievement: 01/04/24 Potential to Achieve Goals: Good Progress towards PT goals: Progressing toward goals    Frequency    Min 5X/week      PT Plan      Co-evaluation              AM-PAC PT "6 Clicks" Mobility    Outcome Measure  Help needed turning from your back to your side while in a flat bed without using bedrails?: A Little Help needed moving from lying on your back to sitting on the side of a flat bed without using bedrails?: A Little Help needed moving to and from a bed to a chair (including a wheelchair)?: A Little Help needed standing up from a chair using your arms (e.g., wheelchair or bedside chair)?: A Little Help needed to walk in hospital room?: A Little Help needed climbing 3-5 steps with a railing? : A Little 6 Click Score: 18    End of Session Equipment Utilized During Treatment: Gait belt Activity Tolerance: Patient tolerated treatment well;Patient limited by fatigue;Other (comment) Patient left: in chair;with call bell/phone within reach;with chair alarm set;with family/visitor present;with nursing/sitter in room Nurse Communication: Mobility status PT Visit Diagnosis: Unsteadiness on feet (R26.81);Difficulty in walking, not elsewhere classified (R26.2);Pain Pain - Right/Left: Right Pain - part of body: Shoulder     Time: 0911-0940 PT Time Calculation (min) (ACUTE ONLY): 29 min  Charges:    $Gait Training: 8-22 mins $Therapeutic Activity: 8-22 mins PT General Charges $$ ACUTE PT VISIT: 1 Visit                     Thedora Finlay PT Acute Rehabilitation Services Pager [phone] Office [phone]    Adriahna Shearman 12/30/2023, 10:41 AM

## 2023-12-30 NOTE — Progress Notes (Signed)
 Subjective: 2 Days Post-Op Procedure(s) (LRB): INSERTION, INTRAMEDULLARY ROD, HUMERUS (Right)  Patient feels better this morning after receiving some fluids. He is hoping to go home today if cleared by PT and also by the medicine team.  Objective: Vital signs in last 24 hours: Temp:  [97.8 F (36.6 C)-98.6 F (37 C)] 98.6 F (37 C) (04/26 0532) Pulse Rate:  [72-90] 90 (04/26 0532) Resp:  [16-18] 16 (04/26 0532) BP: (93-137)/(53-88) 137/82 (04/26 0532) SpO2:  [92 %-96 %] 92 % (04/26 0532)  Labs: Recent Labs    12/29/23 1558 12/30/23 0331  HGB 10.9* 10.3*   Recent Labs    12/29/23 1558 12/30/23 0331  WBC 10.9* 7.6  RBC 3.38* 3.21*  HCT 33.7* 32.4*  PLT 158 138*   No results for input(s): "NA", "K", "CL", "CO2", "BUN", "CREATININE", "GLUCOSE", "CALCIUM " in the last 72 hours. No results for input(s): "LABPT", "INR" in the last 72 hours.  Physical Exam:  Neurologically intact ABD soft Neurovascular intact Sensation intact distally Incision: dressing C/D/I and no drainage No cellulitis present Compartment soft  Assessment/Plan:  2 Days Post-Op Procedure(s) (LRB): INSERTION, INTRAMEDULLARY ROD, HUMERUS (Right) Up with therapy Discharge home today once cleared by therapy and by medicine team Non Weight Bearing (NWB)RUE VTE prophylaxis:  resume Eliquis  We greatly appreciate medical management. Follow up with Dr. Deeann Fare in office 10-14 days.   Geraldina Klinefelter Kanita Delage 12/30/2023, 8:33 AM

## 2023-12-30 NOTE — Progress Notes (Signed)
 Occupational Therapy Treatment Patient Details Name: Lawrence [name]. MRN: [id number] DOB: 1950-10-15 Today's Date: 12/30/2023   History of present illness Patient is a 73 year old male who is s/p fall wtih R humerus and distal ulna fx.  Pt now s/p IM nailing of humerus with ulna being managed non-surgically.  PMH: TIA, PAF, pacemaker, DM, cervical fusion, neuropathy in feet,othostatic   OT comments  Therapist provided education and instruction to patient and wife in regards to exercises, precautions, positioning, donning upper extremity clothing and bathing while maintaining shoulder and wrist precautions, ice and edema management, and donning/doffing sling. Patient and wife verbalized understanding and demonstrated as needed. Patient needed assistance to donn shirt, underwear, pants, and provided with instruction on compensatory strategies to perform ADLs. Patient plans to d/c home with wife support and [facility] services on this date. OT to continue to follow.        If plan is discharge home, recommend the following:  A lot of help with walking and/or transfers;Assist for transportation;Assistance with cooking/housework;A little help with bathing/dressing/bathroom   Equipment Recommendations  BSC/3in1       Precautions / Restrictions Precautions Precautions: Fall Precaution/Restrictions Comments: orthostatic Required Braces or Orthoses: Splint/Cast Splint/Cast: on R forearm Restrictions Weight Bearing Restrictions Per Provider Order: Yes RUE Weight Bearing Per Provider Order: Non weight bearing Other Position/Activity Restrictions: ok to perform elbow, and hand ROM, no shoulder or wrist ROM       Mobility Bed Mobility               General bed mobility comments: patient was up in recliner and remained in the same.             ADL either performed or assessed with clinical judgement   ADL Overall ADL's : Needs assistance/impaired           Upper Body Bathing  Details (indicate cue type and reason): was educated on leaning forwards to see saw wash cloth for hygiene under UE. wife and patient verbalized understanding.     Upper Body Dressing : Sitting;Moderate assistance;Cueing for UE precautions Upper Body Dressing Details (indicate cue type and reason): to don tshirt with education on proper technique to avoid movement of UE.                   General ADL Comments: patient and wife were educated on sling positioning, pillow support to reduce pain and use of ice for edema management.    Extremity/Trunk Assessment Upper Extremity Assessment Upper Extremity Assessment: RUE deficits/detail RUE Deficits / Details: surgery on this shoulder, splint of forearm, no shoulder or wrist ROM allowed. able to wiggle digits not agreeable to elbow movements during session reporting too much pain. RUE: Unable to fully assess due to immobilization RUE Coordination: decreased fine motor;decreased gross motor            Vision Baseline Vision/History: 1 Wears glasses           Communication Communication Communication: No apparent difficulties   Cognition Arousal: Alert Behavior During Therapy: WFL for tasks assessed/performed Cognition: No apparent impairments             OT - Cognition Comments: wife was present as well during session.                                Shoulder Instructions Shoulder Instructions Donning/doffing shirt without moving shoulder: Caregiver independent with task  Method for sponge bathing under operated UE: Caregiver independent with task Donning/doffing sling/immobilizer: Caregiver independent with task Correct positioning of sling/immobilizer: Caregiver independent with task Sling wearing schedule (on at all times/off for ADL's): Caregiver independent with task Proper positioning of operated UE when showering: Caregiver independent with task Positioning of UE while sleeping: Caregiver independent  with task          Pertinent Vitals/ Pain       Pain Assessment Pain Assessment: Faces Faces Pain Scale: Hurts even more Pain Location: R shoulder and forearm with movement Pain Descriptors / Indicators: Grimacing, Guarding, Constant Pain Intervention(s): Limited activity within patient's tolerance, Monitored during session, Premedicated before session, Repositioned (educated on ice use)         Frequency  Min 2X/week        Progress Toward Goals  OT Goals(current goals can now be found in the care plan section)  Progress towards OT goals: Progressing toward goals            AM-PAC OT "6 Clicks" Daily Activity     Outcome Measure   Help from another person eating meals?: None Help from another person taking care of personal grooming?: A Little Help from another person toileting, which includes using toliet, bedpan, or urinal?: A Little Help from another person bathing (including washing, rinsing, drying)?: A Little Help from another person to put on and taking off regular upper body clothing?: A Lot Help from another person to put on and taking off regular lower body clothing?: A Little 6 Click Score: 18    End of Session Equipment Utilized During Treatment: Other (comment) (sling)  OT Visit Diagnosis: Unsteadiness on feet (R26.81);Pain;History of falling (Z91.81) Pain - Right/Left: Right Pain - part of body: Arm   Activity Tolerance Patient tolerated treatment well   Patient Left in chair;with call bell/phone within reach;with family/visitor present   Nurse Communication          Time: 7253-6644 OT Time Calculation (min): 22 min  Charges: OT General Charges $OT Visit: 1 Visit OT Treatments $Self Care/Home Management : 8-22 mins  Lawrence Heckler, MS Acute Rehabilitation Department Office# [phone]   Lawrence [name] 12/30/2023, 11:51 AM

## 2024-01-01 ENCOUNTER — Ambulatory Visit: Attending: Cardiology | Admitting: Cardiology

## 2024-01-01 ENCOUNTER — Encounter (HOSPITAL_COMMUNITY): Payer: Self-pay | Admitting: Orthopedic Surgery

## 2024-01-01 VITALS — BP 110/60 | HR 89 | Ht 73.0 in | Wt 231.0 lb

## 2024-01-01 DIAGNOSIS — I2510 Atherosclerotic heart disease of native coronary artery without angina pectoris: Secondary | ICD-10-CM

## 2024-01-01 DIAGNOSIS — Z9889 Other specified postprocedural states: Secondary | ICD-10-CM | POA: Diagnosis not present

## 2024-01-01 DIAGNOSIS — Z950 Presence of cardiac pacemaker: Secondary | ICD-10-CM | POA: Diagnosis not present

## 2024-01-01 DIAGNOSIS — I480 Paroxysmal atrial fibrillation: Secondary | ICD-10-CM | POA: Diagnosis not present

## 2024-01-01 DIAGNOSIS — E785 Hyperlipidemia, unspecified: Secondary | ICD-10-CM

## 2024-01-01 DIAGNOSIS — R748 Abnormal levels of other serum enzymes: Secondary | ICD-10-CM | POA: Diagnosis not present

## 2024-01-01 DIAGNOSIS — I10 Essential (primary) hypertension: Secondary | ICD-10-CM

## 2024-01-01 DIAGNOSIS — I951 Orthostatic hypotension: Secondary | ICD-10-CM

## 2024-01-01 DIAGNOSIS — Z8679 Personal history of other diseases of the circulatory system: Secondary | ICD-10-CM | POA: Diagnosis not present

## 2024-01-01 NOTE — Progress Notes (Signed)
 Cardiology Office Note    Date:  01/01/2024  ID:  Lawrence [name]., DOB 1950/09/28, MRN [id number] PCP:  Ezell Hollow, MD  Cardiologist:  Alexandria Angel, MD  Electrophysiologist:  None   Chief Complaint: Follow up for afib and orthostatic hypotension   History of Present Illness: .    Lawrence [name]. is a 73 y.o. male with visit-pertinent history of paroxysmal atrial fibrillation, elevated coronary artery calcium  score, bradycardia s/p PPM, TIA, hypertension, orthostatic hypotension, syncope, hyperlipidemia, type 2 diabetes and GERD.  Patient previously followed by Dr. Felipe Horton, now followed by Dr. Audery Blazing and is also followed at [facility].  Patient had pacemaker inserted in 2015 and had atrial fibrillation ablation in 2017 at [facility].  Carotid Dopplers in 2017 showed less than 50% stenosis bilaterally.  Recurrent atrial fibrillation requiring DCCV in 08/2021.  Patient fell in 06/2022 that was felt to be orthostatic mediated and had head trauma with a small bleed, apixaban  was held.  Coronary CTA in May 2024 showed calcium  score of 1025 which was 84th percentile, moderate plaque in the LAD and ramus, otherwise mild plaque.  FFR was negative.  Echocardiogram in May 2024 showed ejection fraction of 55 to 60%, mild LVH, grade 2 diastolic dysfunction, mild aortic stenosis with mean gradient 9 mmHg.  Abdominal ultrasound in June 2024 showed no abdominal aortic aneurysm.  Patient was last seen in clinic on 03/25/2023 by Dr. Audery Blazing, he denied any dyspnea, chest pain or syncope.  He continued to have dizziness with standing.  It was felt that his dizziness was related to autonomic dysfunction secondary to diabetes mellitus.  He was encouraged to maintain hydration and increased his salt intake.  On 12/28/2023 patient underwent intramedullary nail to the right humerus following a proximal humerus shaft fracture.  During hospitalization was noted to be hypotensive, hospitalist was consulted and noted that the  lowest blood pressure documented was 94/67.  During hospital mission orthostatics were checked which were negative, positive for tachycardia however patient was only given half of his usual dose of metoprolol .  Today he presents for follow-up.  He reports that he is overall doing okay.  He notes that last week he fell after standing too fast and became dizzy.  Patient underwent surgery as noted above.  Patient and his wife note that in the last 2 and half years he has had 3 significant falls, most recent fall he denies any presyncope or syncope, notes that he just became dizzy after standing too fast.  He notes that in November/December 2024 he did have a syncopal episode, was evaluated by his EP team at [facility] with a reassuring interrogation of his pacemaker.  It was noted that he was having episodes of atrial fibrillation, no changes were made at that time.  Patient notes he have history of orthostatic hypotension which he feels is worsened by his medications and with increased alcohol use.  Patient notes up until last week he was drinking a full bottle of wine nightly, has since stopped drinking.  Patient reports that he does not have dizziness and lightheadedness every daily, typically just has a short episode that quickly resolves on occasion.  He denies any chest pain, shortness of breath, lower extremity edema, orthopnea or PND.  Patient denies any palpitations or feeling of increased heart rates.  ROS: .   Today he denies chest pain, shortness of breath, lower extremity edema, fatigue, palpitations, melena, hematuria, hemoptysis, diaphoresis, weakness, orthopnea, and PND.  All other systems  are reviewed and otherwise negative. Studies Reviewed: Aaron Aas    EKG:  EKG is ordered today, personally reviewed, demonstrating  EKG Interpretation Date/Time:  Monday January 01 2024 14:22:51 EDT Ventricular Rate:  93 PR Interval:    QRS Duration:  86 QT Interval:  350 QTC Calculation: 435 R Axis:   -16  Text  Interpretation: Atrial fibrillation ST & T wave abnormality, consider anterolateral ischemia Confirmed by Mekiyah Gladwell [phone]) on 01/01/2024 4:42:09 PM   CV Studies: Cardiac studies reviewed are outlined and summarized above. Otherwise please see EMR for full report. Cardiac Studies & Procedures   ______________________________________________________________________________________________     ECHOCARDIOGRAM  ECHOCARDIOGRAM COMPLETE 01/18/2023  Narrative ECHOCARDIOGRAM REPORT    Patient Name:   Lawrence [name]. Date of Exam: 01/18/2023 Medical Rec #:  [id number]           Height:       73.0 in Accession #:    [id number]          Weight:       239.6 lb Date of Birth:  [birth year]           BSA:          2.323 m Patient Age:    72 years            BP:           126/73 mmHg Patient Gender: M                   HR:           75 bpm. Exam Location:  [place]  Procedure: 2D Echo, Cardiac Doppler, Color Doppler and Strain Analysis  Indications:    DOE (dyspnea on exertion) [R06.09 (ICD-10-CM)]  History:        Patient has prior history of Echocardiogram examinations, most recent 04/25/2019. CAD, Pacemaker, Arrythmias:Atrial Fibrillation, Signs/Symptoms:Dyspnea; Risk Factors:Diabetes, Hypertension and Dyslipidemia.  Sonographer:    Erminia Hazel RDCS Referring Phys: [id number] BRIAN S CRENSHAW   Sonographer Comments: Suboptimal subcostal window. IMPRESSIONS   1. Left ventricular ejection fraction, by estimation, is 55 to 60%. The left ventricle has normal function. The left ventricle has no regional wall motion abnormalities. There is mild left ventricular hypertrophy. Left ventricular diastolic parameters are consistent with Grade II diastolic dysfunction (pseudonormalization). The average left ventricular global longitudinal strain is -11.3 %. The global longitudinal strain is abnormal. 2. Right ventricular systolic function is normal. The right ventricular size is normal. There is  normal pulmonary artery systolic pressure. 3. The mitral valve is normal in structure. No evidence of mitral valve regurgitation. No evidence of mitral stenosis. 4. The aortic valve is tricuspid. There is mild calcification of the aortic valve. There is mild thickening of the aortic valve. Aortic valve regurgitation is not visualized. Mild aortic valve stenosis. Aortic valve mean gradient measures 9.0 mmHg. 5. The inferior vena cava is normal in size with greater than 50% respiratory variability, suggesting right atrial pressure of 3 mmHg.  FINDINGS Left Ventricle: Left ventricular ejection fraction, by estimation, is 55 to 60%. The left ventricle has normal function. The left ventricle has no regional wall motion abnormalities. The average left ventricular global longitudinal strain is -11.3 %. The global longitudinal strain is abnormal. The left ventricular internal cavity size was normal in size. There is mild left ventricular hypertrophy. Left ventricular diastolic parameters are consistent with Grade II diastolic dysfunction (pseudonormalization).  Right Ventricle: The right ventricular size is normal. No increase in right  ventricular wall thickness. Right ventricular systolic function is normal. There is normal pulmonary artery systolic pressure. The tricuspid regurgitant velocity is 2.54 m/s, and with an assumed right atrial pressure of 3 mmHg, the estimated right ventricular systolic pressure is 28.8 mmHg.  Left Atrium: Left atrial size was normal in size.  Right Atrium: Right atrial size was normal in size.  Pericardium: There is no evidence of pericardial effusion.  Mitral Valve: The mitral valve is normal in structure. No evidence of mitral valve regurgitation. No evidence of mitral valve stenosis.  Tricuspid Valve: The tricuspid valve is normal in structure. Tricuspid valve regurgitation is mild . No evidence of tricuspid stenosis.  Aortic Valve: The aortic valve is tricuspid.  There is mild calcification of the aortic valve. There is mild thickening of the aortic valve. Aortic valve regurgitation is not visualized. Mild aortic stenosis is present. Aortic valve mean gradient measures 9.0 mmHg. Aortic valve peak gradient measures 14.3 mmHg. Aortic valve area, by VTI measures 1.60 cm.  Pulmonic Valve: The pulmonic valve was normal in structure. Pulmonic valve regurgitation is not visualized. No evidence of pulmonic stenosis.  Aorta: The aortic root is normal in size and structure.  Venous: The inferior vena cava is normal in size with greater than 50% respiratory variability, suggesting right atrial pressure of 3 mmHg.  IAS/Shunts: No atrial level shunt detected by color flow Doppler.   LEFT VENTRICLE PLAX 2D LVIDd:         4.90 cm   Diastology LVIDs:         2.30 cm   LV e' medial:    5.22 cm/s LV PW:         1.50 cm   LV E/e' medial:  12.4 LV IVS:        1.60 cm   LV e' lateral:   7.62 cm/s LVOT diam:     2.20 cm   LV E/e' lateral: 8.5 LV SV:         58 LV SV Index:   25        2D Longitudinal Strain LVOT Area:     3.80 cm  2D Strain GLS Avg:     -11.3 %   RIGHT VENTRICLE RV Basal diam:  3.10 cm RV Mid diam:    2.30 cm RV S prime:     13.70 cm/s TAPSE (M-mode): 1.9 cm  LEFT ATRIUM             Index        RIGHT ATRIUM           Index LA diam:        4.00 cm 1.72 cm/m   RA Area:     16.00 cm LA Vol (A2C):   81.1 ml 34.91 ml/m  RA Volume:   39.90 ml  17.17 ml/m LA Vol (A4C):   57.6 ml 24.79 ml/m LA Biplane Vol: 69.2 ml 29.78 ml/m AORTIC VALVE AV Area (Vmax):    1.51 cm AV Area (Vmean):   1.46 cm AV Area (VTI):     1.60 cm AV Vmax:           189.00 cm/s AV Vmean:          139.000 cm/s AV VTI:            0.362 m AV Peak Grad:      14.3 mmHg AV Mean Grad:      9.0 mmHg LVOT Vmax:  75.10 cm/s LVOT Vmean:        53.300 cm/s LVOT VTI:          0.152 m LVOT/AV VTI ratio: 0.42  AORTA Ao Root diam: 3.70 cm Ao Asc diam:  3.70  cm  MITRAL VALVE               TRICUSPID VALVE MV Area (PHT): 3.95 cm    TR Peak grad:   25.8 mmHg MV Decel Time: 192 msec    TR Vmax:        254.00 cm/s MV E velocity: 64.70 cm/s MV A velocity: 40.30 cm/s  SHUNTS MV E/A ratio:  1.61        Systemic VTI:  0.15 m Systemic Diam: 2.20 cm  Ralene Burger MD Electronically signed by Ralene Burger MD Signature Date/Time: 01/18/2023/12:48:16 PM    Final      CT SCANS  CT CORONARY MORPH W/CTA COR W/SCORE 01/06/2023  Addendum 01/11/2023  4:14 PM ADDENDUM REPORT: 01/11/2023 16:11  ADDENDUM: OVER-READ INTERPRETATION  CT CHEST  The following report is an over-read performed by radiologist Dr. Marland Silvas of [facility]. This over-read does not include interpretation of cardiac or coronary anatomy or pathology; that interpretation by cardiologist is attached.  No pleural or pericardial effusion.  No pneumothorax.  Previous esophagectomy and gastric pull-through. Since 03/02/2020, progressive hiatal hernia involving pancreatic body in addition to splenic flexure of the colon without obstruction or strangulation.  Stable calcified granuloma in the inferior right middle lobe. Visualized lung fields otherwise clear.  Regional bones unremarkable. Cervical fixation hardware partially visualized.  Cholecystectomy clips.  IMPRESSION: 1. No acute findings. 2. Progressive hiatal hernia .   Electronically Signed By: Nicoletta Barrier M.D. On: 01/11/2023 16:11  Narrative CLINICAL DATA:  Chest pain  EXAM: Cardiac/Coronary CTA  TECHNIQUE: A non-contrast, gated CT scan was obtained with axial slices of 3 mm through the heart for calcium  scoring. Calcium  scoring was performed using the Agatston method. A 120 kV prospective, gated, contrast cardiac scan was obtained. Gantry rotation speed was 250 msecs and collimation was 0.6 mm. Two sublingual nitroglycerin  tablets (0.8 mg) were given. The 3D data set was  reconstructed in 5% intervals of the 35-75% of the R-R cycle. Diastolic phases were analyzed on a dedicated workstation using MPR, MIP, and VRT modes. The patient received 95 cc of contrast.  FINDINGS: Image quality: Excellent.  Noise artifact is: Limited.  Coronary Arteries:  Normal coronary origin.  Right dominance.  Left main: The left main is a large caliber vessel with a normal take off from the left coronary cusp that trifurcates into a LAD, LCX, and ramus intermedius. There is minimal mixed density plaque (<25%).  Left anterior descending artery: The proximal LAD contains moderate mixed density plaque (50-69%). The mid LAD and distal LAD segments are patent. There is a LAD is patent without evidence of plaque or stenosis. The LAD gives of 1 diagonal vessel with mild non calcified plaque (25-49%).  Ramus intermedius: Moderate calcified plaque in the proximal segment (50-69%).  Left circumflex artery: The LCX is non-dominant. The proximal LCX contains mild calcified plaque (25-49%). OM1 contains mild calcified plaque (25-49%). The distal LCX is patent and terminates as a patent OM2.  Right coronary artery: The RCA is dominant with normal take off from the right coronary cusp. There is diffuse mild mixed density plaque in the proximal, mid, and distal segments (25-49%). The RCA terminates as a PDA and right posterolateral branch. The  PDA is patent. The PLV contains minimal mixed density plaque (<25%).  Right Atrium: Right atrial size is within normal limits. PPM lead terminates in the RA.  Right Ventricle: The right ventricular cavity is within normal limits. PPM lead terminates in the RV.  Left Atrium: Left atrial size is normal in size with no left atrial appendage filling defect.  Left Ventricle: The ventricular cavity size is within normal limits.  Pulmonary arteries: Normal in size.  Pulmonary veins: Normal pulmonary venous drainage.  Pericardium: Normal  thickness without significant effusion or calcium  present.  Cardiac valves: The aortic valve is trileaflet without significant calcification. The mitral valve is normal without significant calcification.  Aorta: Normal caliber without significant disease.  Extra-cardiac findings: Large hiatal hernia. See attached radiology report for non-cardiac structures.  IMPRESSION: 1. Coronary calcium  score of 1025. This was 84th percentile for age-, sex, and race-matched controls.  2. Total plaque volume 681 mm3 which is 51st percentile for age- and sex-matched controls (calcified plaque 144 mm3; non-calcified plaque 537 mm3; low attenuation plaque 18 mm3). TPV is severe.  3. Normal coronary origin with right dominance.  4. Moderate mixed density plaque in the proximal LAD (50-69%).  5. Moderate calcified plaque in the ramus (50-69%).  6. Mild calcified plaque in the LCX (25-49%).  7. Mild mixed density plaque in the RCA (25-49%).  RECOMMENDATIONS: 1. Moderate stenosis. Consider symptom-guided anti-ischemic pharmacotherapy as well as risk factor modification per guideline directed care. Additional analysis with CT FFR will be submitted.  Jackquelyn Mass, MD  Electronically Signed: By: Jackquelyn Mass M.D. On: 01/06/2023 13:25   CT SCANS  CT CORONARY MORPH W/CTA COR W/SCORE 05/09/2019  Addendum 05/09/2019  9:13 PM ADDENDUM REPORT: 05/09/2019 21:10  HISTORY: 72 yo male with shortness of breath, HLD, HTN, history of dual-chamber pacemaker  EXAM: Cardiac/Coronary CTA  TECHNIQUE: The patient was scanned on a [employer].  PROTOCOL: A 120 kV prospective scan was triggered in the descending thoracic aorta at 111 HU's. Axial non-contrast 3 mm slices were carried out through the heart. The data set was analyzed on a dedicated work station and scored using the Agatson method. Gantry rotation speed was 250 msecs and collimation was .6 mm. Beta blockade and 0.8 mg of sl  NTG was given. The 3D data set was reconstructed in 5% intervals of the 67-82 % of the R-R cycle. Diastolic phases were analyzed on a dedicated work station using MPR, MIP and VRT modes. The patient received 80mL OMNIPAQUE  IOHEXOL  350 MG/ML SOLN of contrast.  FINDINGS: Coronary calcium  score: The patient's coronary artery calcium  score is 357, which places the patient in the 73rd percentile.  Coronary arteries: Normal coronary origins.  Right dominance.  Right Coronary Artery: Dominant vessel. There is a mild 25-49% (CADRADS 2) mixed plaque of the proximal RCA. There is another mild 24-49% (CADRADS 2) non-calcified plaque of the distal RCA.  Left Main Coronary Artery: Calcification of the LCC near the ostium of the left main. No significant left main stenosis.  Left Anterior Descending Coronary Artery: Calcified proximally with a mild 25-49% (CADRADS2) mixed plaque of the proximal to mid LAD just prior to the D1 bifurcation. The distal LAD tapers to a small vessel and is not well-visualized. The moderate-sized D1 branch is calcified proximally with a minimal 1-24% (CADRADS1) stenosis.  Ramus Intermedius Artery: Small branch (<2.0 mm) which is calcified proximally and bifurcates in the mid-vessel. There is a suspected 50-69% (CADRADS3) mixed stenosis in the mid-vessel just prior  to the bifurcation. Given the small vessel size, it is not likely amenable to intervention.  Left Circumflex Artery: Calcified proximally with moderate mixed 50-69% stenosis (CADRADS3). There is a moderate-sized OM branch which is calcified proximally and has diffuse mild 24-49% proximal stenosis (CADRADS2).  Aorta: Normal size, 37 mm at the mid ascending aorta (level of the PA bifurcation) measured double oblique. No calcifications. No dissection.  Aortic Valve: Trileaflet valve with calcification of the leaflets (particularly the right coronary cusp) and annulus.  Other findings:  Normal pulmonary  vein drainage into the left atrium. No evidence for pulmonary vein stenosis given prior afib ablation.  Normal left atrial appendage without a thrombus.  Dilated pulmonary artery measuring 32 mm at the level of the PA bifurcation.  Dual chamber pacemaker with lead tips in the RA appendage and RV apex.  IMPRESSION: 1. Multivessel non-obstructive CAD, with mild non-obstructive mixed plaque of the the LAD, RCA and OM1 and moderate mixed stenosis of a small Ramus branch, not amenable to intervention. CADRADS 2/3.  2. Coronary calcium  score of 357. This was 73rd percentile for age and sex matched control.  3. Normal coronary origin with right dominance.  4. Trileaflet aortic valve with calcification of the right coronary cusp and annulus.  5. Dilated main PA measuring 32 mm.  6. Dual chamber pacemaker noted with lead tips in the expected position   Electronically Signed By: Hazle Lites M.D. On: 05/09/2019 21:10  Narrative EXAM: OVER-READ INTERPRETATION  CT CHEST  The following report is an over-read performed by radiologist Dr. Alexandria Angel of [facility] on 05/09/2019. This over-read does not include interpretation of cardiac or coronary anatomy or pathology. The coronary calcium  score/coronary CTA interpretation by the cardiologist is attached.  COMPARISON:  None.  FINDINGS: Aortic atherosclerosis. Calcified granuloma in the lateral segment of the right middle lobe. Postoperative changes of esophagectomy and gastric pull-through. Within the visualized portions of the thorax there are no suspicious appearing pulmonary nodules or masses, there is no acute consolidative airspace disease, no pleural effusions, no pneumothorax and no lymphadenopathy. Visualized portions of the upper abdomen are unremarkable. There are no aggressive appearing lytic or blastic lesions noted in the visualized portions of the skeleton.  IMPRESSION: 1.  Aortic  Atherosclerosis (ICD10-I70.0).  Electronically Signed: By: Alexandria Angel M.D. On: 05/09/2019 08:37     ______________________________________________________________________________________________       Current Reported Medications:.    Current Meds  Medication Sig   apixaban  (ELIQUIS ) 5 MG TABS tablet Take 1 tablet (5 mg total) by mouth 2 (two) times daily.   budesonide -formoterol  (SYMBICORT ) 160-4.5 MCG/ACT inhaler Inhale 2 puffs into the lungs 2 (two) times daily. (Patient taking differently: Inhale 2 puffs into the lungs 2 (two) times daily as needed (Cough).)   Continuous Glucose Receiver (FREESTYLE LIBRE 3 READER) DEVI by Does not apply route.   ferrous sulfate 325 (65 FE) MG tablet Take 325 mg by mouth daily.   FLUoxetine  (PROZAC ) 20 MG tablet Take 3 tablets (60 mg total) by mouth daily.   HUMULIN  70/30 (70-30) 100 UNIT/ML injection 20 UNITS IN THE MORNING WITH BREAKFAST AND 8 UNITS IN THE EVENING WITH SUPPER.   Insulin  Syringe-Needle U-100 (BD VEO INSULIN  SYRINGE U/F) 31G X 15/64" 0.3 ML MISC Use as directed in the morning, at noon, in the evening, and at bedtime.   metoprolol  tartrate (LOPRESSOR ) 25 MG tablet Take 25 mg by mouth 2 (two) times daily.   Multiple Vitamins-Minerals (PRESERVISION AREDS PO) Take  1 tablet by mouth in the morning and at bedtime. Preservision AREDS   oxyCODONE  (OXY IR/ROXICODONE ) 5 MG immediate release tablet Take 1-2 tablets (5-10 mg total) by mouth every 4 (four) hours as needed for moderate pain (pain score 4-6) (pain score 4-6).   rosuvastatin  (CRESTOR ) 40 MG tablet Take 40 mg by mouth daily.   tiZANidine  (ZANAFLEX ) 4 MG tablet Take 1 tablet (4 mg total) by mouth every 8 (eight) hours as needed.    Physical Exam:    VS:  BP 110/60   Pulse 89   Ht 6\' 1"  (1.854 m)   Wt 231 lb (104.8 kg)   SpO2 98%   BMI 30.48 kg/m    Wt Readings from Last 3 Encounters:  01/01/24 231 lb (104.8 kg)  12/28/23 232 lb 3.2 oz (105.3 kg)  12/27/23 232 lb 3.2  oz (105.3 kg)    GEN: Well nourished, well developed in no acute distress NECK: No JVD; No carotid bruits CARDIAC: Irregular RR, no murmurs, rubs, gallops RESPIRATORY:  Clear to auscultation without rales, wheezing or rhonchi  ABDOMEN: Soft, non-tender, non-distended EXTREMITIES:  No edema; No acute deformity     Asessement and Plan:.    Persistent atrial fibrillation: S/p ablation, patient noted to have occasional recurrences on device interrogation at [facility].  EKG today indicates atrial fibrillation at 93 bpm.  Patient's Eliquis  was recently held for surgery given recent fall, he has since restarted Eliquis . Patient denies any symptoms, denies palpitations or increased shortness of breath.  On chart review patient was previously on Tikosyn , he is unsure why this was previously discontinued.  Patient reports that he would prefer to transfer heartcare fully to [facility], discussed referral to atrial fibrillation clinic and electrophysiology team.  Patient in agreement would be appreciative of referral to A-fib clinic. Continue metoprolol  and Eliquis .  Discussed need for alcohol cessation, patient denies any snoring, does not feel he has sleep apnea. Patient denies any bleeding problems on Eliquis . Check CBC, CMET and TSH.   Coronary artery disease: Coronary CTA in May 2024 showed currently a calcium  score of 1025 which was 84th percentile, moderate plaque in the LAD and ramus, otherwise mild plaque.  FFR was negative.  Stable with no anginal symptoms. No indication for ischemic evaluation.  Heart healthy diet and regular cardiovascular exercise encouraged.  Continue Eliquis .   History of pacemaker: [employer] Advisa dual-chamber pacemaker placed in 2015 at [facility], patient previously followed there.  He is requesting a referral to [facility] EP team as he would like to establish care here.  Referral placed.  History of orthostasis: Patient notes long history of orthostatic hypotension,  notes this has previously contributed to falls.  Patient notes that he has had 2 falls in which he has passed out associated with orthostatic hypotension, last fall in November 2024, device was interrogated by his EP team and was reportedly normal.  Patient reports that he does occasionally have increased dizziness and lightheadedness with position changes, denies any today.  He notes that he feels this is worsened by medications and prior alcohol intake, up until a week ago was drinking a full bottle of wine nightly.  Discussed checking orthostatic vitals today however patient notes that he is having significant pain in his shoulder related to recent shoulder surgery, deferred at this time, denies any dizziness or lightheadedness with position changes today.  Orthostatics while in hospital reportedly negative.  Discussed with patient need for alcohol cessation, increasing water  and salt intake as  well as trying compression stockings.  Reviewed orthostatic precautions.  Hypertension: Blood pressure today 110/60.  Continue metoprolol .  Hyperlipidemia: Last lipid profile on 01/05/2023 indicated total cholesterol 119, HDL 42, triglycerides 197 and LDL 44.  Continue rosuvastatin  40 mg daily.  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  Per notes from [facility] biopsy did not show cirrhosis, felt but elevated alkaline phosphatase levels were likely due to the fatty liver.  Recommended follow-up with PCP for ongoing monitoring.    Disposition: F/u with afib clinic and EP. F/u with Dr. Audery Blazing in four months.   Signed, Shamone Winzer D Random Dobrowski, NP

## 2024-01-01 NOTE — Patient Instructions (Signed)
 Medication Instructions:  No changes *If you need a refill on your cardiac medications before your next appointment, please call your pharmacy*  Lab Work: Today we are going to draw a CBC, Cmet, and TSH If you have labs (blood work) drawn today and your tests are completely normal, you will receive your results only by: MyChart Message (if you have MyChart) OR A paper copy in the mail If you have any lab test that is abnormal or we need to change your treatment, we will call you to review the results.  Testing/Procedures: No testing  Follow-Up: At [facility], you and your health needs are our priority.  As part of our continuing mission to provide you with exceptional heart care, our providers are all part of one team.  This team includes your primary Cardiologist (physician) and Advanced Practice Providers or APPs (Physician Assistants and Nurse Practitioners) who all work together to provide you with the care you need, when you need it.  Your next appointment:   4 month(s)  Provider:   Alexandria Angel, MD    We recommend signing up for the patient portal called "MyChart".  Sign up information is provided on this After Visit Summary.  MyChart is used to connect with patients for Virtual Visits (Telemedicine).  Patients are able to view lab/test results, encounter notes, upcoming appointments, etc.  Non-urgent messages can be sent to your provider as well.   To learn more about what you can do with MyChart, go to [email or web address].   Other Instructions We are going to refer you over to our Afib clinic We are going to refer you over to the EP clinic

## 2024-01-02 LAB — CBC
Hematocrit: 35.4 % — ABNORMAL LOW (ref 37.5–51.0)
Hemoglobin: 11.8 g/dL — ABNORMAL LOW (ref 13.0–17.7)
MCH: 32 pg (ref 26.6–33.0)
MCHC: 33.3 g/dL (ref 31.5–35.7)
MCV: 96 fL (ref 79–97)
Platelets: 238 10*3/uL (ref 150–450)
RBC: 3.69 x10E6/uL — ABNORMAL LOW (ref 4.14–5.80)
RDW: 12.6 % (ref 11.6–15.4)
WBC: 10 10*3/uL (ref 3.4–10.8)

## 2024-01-02 LAB — COMPREHENSIVE METABOLIC PANEL WITH GFR
ALT: 13 IU/L (ref 0–44)
AST: 25 IU/L (ref 0–40)
Albumin: 3.8 g/dL (ref 3.8–4.8)
Alkaline Phosphatase: 159 IU/L — ABNORMAL HIGH (ref 44–121)
BUN/Creatinine Ratio: 13 (ref 10–24)
BUN: 16 mg/dL (ref 8–27)
Bilirubin Total: 1.3 mg/dL — ABNORMAL HIGH (ref 0.0–1.2)
CO2: 24 mmol/L (ref 20–29)
Calcium: 9.6 mg/dL (ref 8.6–10.2)
Chloride: 95 mmol/L — ABNORMAL LOW (ref 96–106)
Creatinine, Ser: 1.2 mg/dL (ref 0.76–1.27)
Globulin, Total: 2.5 g/dL (ref 1.5–4.5)
Glucose: 122 mg/dL — ABNORMAL HIGH (ref 70–99)
Potassium: 4.4 mmol/L (ref 3.5–5.2)
Sodium: 133 mmol/L — ABNORMAL LOW (ref 134–144)
Total Protein: 6.3 g/dL (ref 6.0–8.5)
eGFR: 64 mL/min/{1.73_m2} (ref >59)

## 2024-01-02 LAB — TSH: TSH: 0.758 u[IU]/mL (ref 0.450–4.500)

## 2024-01-03 DIAGNOSIS — S52691D Other fracture of lower end of right ulna, subsequent encounter for closed fracture with routine healing: Secondary | ICD-10-CM | POA: Diagnosis not present

## 2024-01-03 DIAGNOSIS — I480 Paroxysmal atrial fibrillation: Secondary | ICD-10-CM | POA: Diagnosis not present

## 2024-01-03 DIAGNOSIS — Z794 Long term (current) use of insulin: Secondary | ICD-10-CM | POA: Diagnosis not present

## 2024-01-03 DIAGNOSIS — I119 Hypertensive heart disease without heart failure: Secondary | ICD-10-CM | POA: Diagnosis not present

## 2024-01-03 DIAGNOSIS — I088 Other rheumatic multiple valve diseases: Secondary | ICD-10-CM | POA: Diagnosis not present

## 2024-01-03 DIAGNOSIS — I951 Orthostatic hypotension: Secondary | ICD-10-CM | POA: Diagnosis not present

## 2024-01-03 DIAGNOSIS — E1142 Type 2 diabetes mellitus with diabetic polyneuropathy: Secondary | ICD-10-CM | POA: Diagnosis not present

## 2024-01-03 DIAGNOSIS — S42291D Other displaced fracture of upper end of right humerus, subsequent encounter for fracture with routine healing: Secondary | ICD-10-CM | POA: Diagnosis not present

## 2024-01-03 DIAGNOSIS — F419 Anxiety disorder, unspecified: Secondary | ICD-10-CM | POA: Diagnosis not present

## 2024-01-03 DIAGNOSIS — F32A Depression, unspecified: Secondary | ICD-10-CM | POA: Diagnosis not present

## 2024-01-04 ENCOUNTER — Telehealth: Payer: Self-pay

## 2024-01-04 DIAGNOSIS — F419 Anxiety disorder, unspecified: Secondary | ICD-10-CM | POA: Diagnosis not present

## 2024-01-04 DIAGNOSIS — F32A Depression, unspecified: Secondary | ICD-10-CM | POA: Diagnosis not present

## 2024-01-04 DIAGNOSIS — I088 Other rheumatic multiple valve diseases: Secondary | ICD-10-CM | POA: Diagnosis not present

## 2024-01-04 DIAGNOSIS — I951 Orthostatic hypotension: Secondary | ICD-10-CM | POA: Diagnosis not present

## 2024-01-04 DIAGNOSIS — S52691D Other fracture of lower end of right ulna, subsequent encounter for closed fracture with routine healing: Secondary | ICD-10-CM | POA: Diagnosis not present

## 2024-01-04 DIAGNOSIS — E1142 Type 2 diabetes mellitus with diabetic polyneuropathy: Secondary | ICD-10-CM | POA: Diagnosis not present

## 2024-01-04 DIAGNOSIS — I119 Hypertensive heart disease without heart failure: Secondary | ICD-10-CM | POA: Diagnosis not present

## 2024-01-04 DIAGNOSIS — I480 Paroxysmal atrial fibrillation: Secondary | ICD-10-CM | POA: Diagnosis not present

## 2024-01-04 DIAGNOSIS — S42291D Other displaced fracture of upper end of right humerus, subsequent encounter for fracture with routine healing: Secondary | ICD-10-CM | POA: Diagnosis not present

## 2024-01-04 NOTE — Telephone Encounter (Signed)
 Left message to call back

## 2024-01-04 NOTE — Telephone Encounter (Signed)
-----   Message from Lorrin Rotter sent at 01/03/2024  1:16 PM EDT ----- Please let Lawrence [name] know that his CBC shows that his hemoglobin and hematocrit is improving post procedure, there is no evidence of infection. His kidney function is stable, his sodium level is slightly low, his electrolytes are otherwise normal. His alkaline phosphatsase is elevated, similar to prior levels. His thyroid  function is normal. Continue current medications and follow up with afib clinic as planned.

## 2024-01-05 DIAGNOSIS — I951 Orthostatic hypotension: Secondary | ICD-10-CM | POA: Diagnosis not present

## 2024-01-05 DIAGNOSIS — I480 Paroxysmal atrial fibrillation: Secondary | ICD-10-CM | POA: Diagnosis not present

## 2024-01-05 DIAGNOSIS — E1142 Type 2 diabetes mellitus with diabetic polyneuropathy: Secondary | ICD-10-CM | POA: Diagnosis not present

## 2024-01-05 DIAGNOSIS — I119 Hypertensive heart disease without heart failure: Secondary | ICD-10-CM | POA: Diagnosis not present

## 2024-01-05 DIAGNOSIS — S42291D Other displaced fracture of upper end of right humerus, subsequent encounter for fracture with routine healing: Secondary | ICD-10-CM | POA: Diagnosis not present

## 2024-01-05 DIAGNOSIS — F419 Anxiety disorder, unspecified: Secondary | ICD-10-CM | POA: Diagnosis not present

## 2024-01-05 DIAGNOSIS — F32A Depression, unspecified: Secondary | ICD-10-CM | POA: Diagnosis not present

## 2024-01-05 DIAGNOSIS — S52691D Other fracture of lower end of right ulna, subsequent encounter for closed fracture with routine healing: Secondary | ICD-10-CM | POA: Diagnosis not present

## 2024-01-05 DIAGNOSIS — I088 Other rheumatic multiple valve diseases: Secondary | ICD-10-CM | POA: Diagnosis not present

## 2024-01-05 NOTE — Telephone Encounter (Signed)
 Left message to call back

## 2024-01-08 DIAGNOSIS — M25511 Pain in right shoulder: Secondary | ICD-10-CM | POA: Diagnosis not present

## 2024-01-08 DIAGNOSIS — M25531 Pain in right wrist: Secondary | ICD-10-CM | POA: Diagnosis not present

## 2024-01-08 NOTE — Telephone Encounter (Signed)
 Called patient to advised on blood work results. Got in contact with patients wife advised of below she verbalized understanding . Patient wife wanted to know about scheduling of her husbands EP appointment got in contact with Tanna Fanning (EP scheduling) and scheduled patient with Dr. Lawana Pray for the 8th at 12:00. Patient wife reports that patient is having worsening dizziness resulting in him having to sit down, and has been noted to have low blood pressure when standing up. Patient is dizziness "hits him" after taking a few steps. When patient was at Physical therapy an Occupational therapy, he had gotten dizzy again and they were unable to get a reading at that time due to how low blood pressure been. Patient seems to be more fatigued not moving around started at about at the same time as his falls He has fallen due to the dizziness (not since he was last office visit) No lost of consciousness, SOB, no nausea or vomiting,no excess bleeding, Patient wife notes that he seems to note blood pressures have been raging in the 110's-120's. Pulse at home has been good Patient does have bruising but hard to tell if new or from previous fall. Patient wife reports that patient is the same but she is concerned about how far out the Afib clinic has been scheduled out. FYI

## 2024-01-09 DIAGNOSIS — I480 Paroxysmal atrial fibrillation: Secondary | ICD-10-CM | POA: Diagnosis not present

## 2024-01-09 DIAGNOSIS — I088 Other rheumatic multiple valve diseases: Secondary | ICD-10-CM | POA: Diagnosis not present

## 2024-01-09 DIAGNOSIS — I119 Hypertensive heart disease without heart failure: Secondary | ICD-10-CM | POA: Diagnosis not present

## 2024-01-09 DIAGNOSIS — I951 Orthostatic hypotension: Secondary | ICD-10-CM | POA: Diagnosis not present

## 2024-01-09 DIAGNOSIS — S42291D Other displaced fracture of upper end of right humerus, subsequent encounter for fracture with routine healing: Secondary | ICD-10-CM | POA: Diagnosis not present

## 2024-01-09 DIAGNOSIS — F32A Depression, unspecified: Secondary | ICD-10-CM | POA: Diagnosis not present

## 2024-01-09 DIAGNOSIS — E1142 Type 2 diabetes mellitus with diabetic polyneuropathy: Secondary | ICD-10-CM | POA: Diagnosis not present

## 2024-01-09 DIAGNOSIS — S52691D Other fracture of lower end of right ulna, subsequent encounter for closed fracture with routine healing: Secondary | ICD-10-CM | POA: Diagnosis not present

## 2024-01-09 DIAGNOSIS — F419 Anxiety disorder, unspecified: Secondary | ICD-10-CM | POA: Diagnosis not present

## 2024-01-10 ENCOUNTER — Encounter: Payer: Self-pay | Admitting: Cardiology

## 2024-01-10 NOTE — Progress Notes (Unsigned)
  Electrophysiology Office Note:   Date:  01/10/2024  ID:  [name]., DOB [birth year], MRN [id number]  Primary Cardiologist: Alexandria Angel, MD Primary Heart Failure: None Electrophysiologist: None  {Click to update primary MD,subspecialty MD or APP then REFRESH:1}    History of Present Illness:   Lawrence [name]. is a 73 y.o. male with h/o paroxysmal atrial fibrillation, elevated coronary calcium  score, symptomatic bradycardia, TIA, hypertension, orthostasis, hyperlipidemia, diabetes seen today for  for Electrophysiology evaluation of pacemaker at the request of Katlyn West.   He had a pacemaker inserted in 2015.  He had a A-fib ablation in 2017 at [facility].  Coronary CT in 2024 showed an elevated calcium  score of 1025 with FFR showing no obstructive disease.  Echo at the time showed ejection fraction of 55 to 60%.  Today, denies symptoms of palpitations, chest pain, shortness of breath, orthopnea, PND, lower extremity edema, claudication, dizziness, presyncope, syncope, bleeding, or neurologic sequela. The patient is tolerating medications without difficulties. ***     Review of systems complete and found to be negative unless listed in HPI.      EP Information / Studies Reviewed:    {EKGtoday:28818}      PPM Interrogation-  reviewed in detail today,  See PACEART report.  Device History: Medtronic Dual Chamber PPM implanted 2015 for Symptomatic bradycardia  Risk Assessment/Calculations:    CHA2DS2-VASc Score = 6  {Confirm score is correct.  If not, click here to update score.  REFRESH note.  :1} This indicates a 9.7% annual risk of stroke. The patient's score is based upon: CHF History: 0 HTN History: 1 Diabetes History: 1 Stroke History: 2 Vascular Disease History: 1 Age Score: 1 Gender Score: 0   {This patient has a significant risk of stroke if diagnosed with atrial fibrillation.  Please consider VKA or DOAC agent for anticoagulation if the bleeding risk is  acceptable.   You can also use the SmartPhrase .HCCHADSVASC for documentation.   :[id number]}         Physical Exam:   VS:  There were no vitals taken for this visit.   Wt Readings from Last 3 Encounters:  01/01/24 231 lb (104.8 kg)  12/28/23 232 lb 3.2 oz (105.3 kg)  12/27/23 232 lb 3.2 oz (105.3 kg)     GEN: Well nourished, well developed in no acute distress NECK: No JVD; No carotid bruits CARDIAC: {EPRHYTHM:28826}, no murmurs, rubs, gallops RESPIRATORY:  Clear to auscultation without rales, wheezing or rhonchi  ABDOMEN: Soft, non-tender, non-distended EXTREMITIES:  No edema; No deformity   ASSESSMENT AND PLAN:    Symptomatic bradycardia s/p Medtronic PPM  Normal PPM function See Pace Art report Sensing, threshold, impedance within normal limits Programming reviewed and appropriate for patient Equilla Que enroll in remote monitoring No changes today  2.  Coronary artery disease: Elevated calcium  score.  FFR negative.  Continue management per primary cardiology.  3.  Atrial fibrillation: Post ablation at [facility] in 2017.***  4.  Secondary per coagulable state: On Eliquis  for atrial fibrillation   Disposition:   Follow up with {EPPROVIDERS:28135} {EPFOLLOW NW:29562}  Signed, Kristi Hyer Cortland Ding, MD

## 2024-01-11 ENCOUNTER — Ambulatory Visit: Attending: Cardiology | Admitting: Cardiology

## 2024-01-11 ENCOUNTER — Encounter: Payer: Self-pay | Admitting: Cardiology

## 2024-01-11 VITALS — BP 78/60 | HR 94 | Ht 73.0 in | Wt 231.0 lb

## 2024-01-11 DIAGNOSIS — I951 Orthostatic hypotension: Secondary | ICD-10-CM | POA: Diagnosis not present

## 2024-01-11 DIAGNOSIS — S52691D Other fracture of lower end of right ulna, subsequent encounter for closed fracture with routine healing: Secondary | ICD-10-CM | POA: Diagnosis not present

## 2024-01-11 DIAGNOSIS — I4819 Other persistent atrial fibrillation: Secondary | ICD-10-CM

## 2024-01-11 DIAGNOSIS — I2510 Atherosclerotic heart disease of native coronary artery without angina pectoris: Secondary | ICD-10-CM

## 2024-01-11 DIAGNOSIS — R001 Bradycardia, unspecified: Secondary | ICD-10-CM | POA: Diagnosis not present

## 2024-01-11 DIAGNOSIS — I480 Paroxysmal atrial fibrillation: Secondary | ICD-10-CM | POA: Diagnosis not present

## 2024-01-11 DIAGNOSIS — I119 Hypertensive heart disease without heart failure: Secondary | ICD-10-CM | POA: Diagnosis not present

## 2024-01-11 DIAGNOSIS — S42291D Other displaced fracture of upper end of right humerus, subsequent encounter for fracture with routine healing: Secondary | ICD-10-CM | POA: Diagnosis not present

## 2024-01-11 DIAGNOSIS — I088 Other rheumatic multiple valve diseases: Secondary | ICD-10-CM | POA: Diagnosis not present

## 2024-01-11 DIAGNOSIS — D6869 Other thrombophilia: Secondary | ICD-10-CM | POA: Diagnosis not present

## 2024-01-11 DIAGNOSIS — F419 Anxiety disorder, unspecified: Secondary | ICD-10-CM | POA: Diagnosis not present

## 2024-01-11 DIAGNOSIS — E1142 Type 2 diabetes mellitus with diabetic polyneuropathy: Secondary | ICD-10-CM | POA: Diagnosis not present

## 2024-01-11 DIAGNOSIS — F32A Depression, unspecified: Secondary | ICD-10-CM | POA: Diagnosis not present

## 2024-01-11 NOTE — Patient Instructions (Signed)
 Medication Instructions:  Your physician recommends that you continue on your current medications as directed. Please refer to the Current Medication list given to you today.  *If you need a refill on your cardiac medications before your next appointment, please call your pharmacy*  Lab Work: None ordered  If you have any lab test that is abnormal or we need to change your treatment, we will call you to review the results.  Testing/Procedures: Your physician has recommended that you have an ablation. Catheter ablation is a medical procedure used to treat some cardiac arrhythmias (irregular heartbeats). During catheter ablation, a long, thin, flexible tube is put into a blood vessel in your groin (upper thigh), or neck. This tube is called an ablation catheter. It is then guided to your heart through the blood vessel. Radio frequency waves destroy small areas of heart tissue where abnormal heartbeats may cause an arrhythmia to start.   Please check on cost and let us  know.    CPT code is 93656   Follow-Up: At [facility], you and your health needs are our priority.  As part of our continuing mission to provide you with exceptional heart care, our providers are all part of one team.  This team includes your primary Cardiologist (physician) and Advanced Practice Providers or APPs (Physician Assistants and Nurse Practitioners) who all work together to provide you with the care you need, when you need it.  Your next appointment:   To be determined  Provider:   Agatha Horsfall, MD     Thank you for choosing [facility]!!   Reece Cane, RN [phone]   Other Instructions  Cardiac Ablation Cardiac ablation is a procedure to destroy (ablate) heart tissue that is sending bad signals. These bad signals cause the heart to beat very fast or in a way that is not normal. Destroying some tissues can help make the heart rhythm normal. Tell your doctor about: Any allergies you  have. All medicines you are taking. These include vitamins, herbs, eye drops, creams, and over-the-counter medicines. Any problems you or family members have had with anesthesia. Any bleeding problems you have. Any surgeries you have had. Any medical conditions you have. Whether you are pregnant or may be pregnant. What are the risks? Your doctor will talk with you about risks. These may include: Infection. Bruising and bleeding. Stroke or blood clots. Damage to nearby areas of your body. Allergies to medicines or dyes. Needing a pacemaker if the heart gets damaged. A pacemaker helps the heart beat normally. The procedure not working. What happens before the procedure? Medicines Ask your doctor about changing or stopping: Your normal medicines. Vitamins, herbs, and supplements. Over-the-counter medicines. Do not take aspirin or ibuprofen unless you are told to. General instructions Follow instructions from your doctor about what you may eat and drink. If you will be going home right after the procedure, plan to have a responsible adult: Take you home from the hospital or clinic. You will not be allowed to drive. Care for you for the time you are told. Ask your doctor what steps will be taken to prevent the spread of germs. What happens during the procedure?  An IV tube will be put into one of your veins. You may be given: A sedative. This helps you relax. Anesthesia. This will: Numb certain areas of your body. The skin on your neck or groin will be numbed. A cut (incision) will be made in your neck or groin. A needle will  be put through the cut and into a large vein. The small, thin tube (catheter) will be put into the needle. The tube will be moved to your heart. A type of X-ray (fluoroscopy) will be used to help guide the tube. It will also show constant images of the heart on a screen. Dye may be put through the tube. This helps your doctor see your heart. An electric  current will be sent from the tube to destroy heart tissue in certain areas. The tube will be taken out. Pressure will be held on your cut. This helps stop bleeding. A bandage (dressing) will be put over your cut. The procedure may vary among doctors and hospitals. What happens after the procedure? You will be monitored until you leave the hospital or clinic. This includes checking your blood pressure, heart rate and rhythm, breathing rate, and blood oxygen level. Your cut will be checked for bleeding. You will need to lie still for a few hours. If your groin was used, you will need to keep your leg straight for a few hours after the small, thin tube is removed. This information is not intended to replace advice given to you by your health care provider. Make sure you discuss any questions you have with your health care provider. Document Revised: 02/08/2022 Document Reviewed: 02/08/2022 Elsevier Patient Education  2024 [employer].

## 2024-01-12 ENCOUNTER — Telehealth: Payer: Self-pay

## 2024-01-12 DIAGNOSIS — I088 Other rheumatic multiple valve diseases: Secondary | ICD-10-CM | POA: Diagnosis not present

## 2024-01-12 DIAGNOSIS — S42291D Other displaced fracture of upper end of right humerus, subsequent encounter for fracture with routine healing: Secondary | ICD-10-CM | POA: Diagnosis not present

## 2024-01-12 DIAGNOSIS — S52691D Other fracture of lower end of right ulna, subsequent encounter for closed fracture with routine healing: Secondary | ICD-10-CM | POA: Diagnosis not present

## 2024-01-12 DIAGNOSIS — F32A Depression, unspecified: Secondary | ICD-10-CM | POA: Diagnosis not present

## 2024-01-12 DIAGNOSIS — I951 Orthostatic hypotension: Secondary | ICD-10-CM | POA: Diagnosis not present

## 2024-01-12 DIAGNOSIS — I480 Paroxysmal atrial fibrillation: Secondary | ICD-10-CM | POA: Diagnosis not present

## 2024-01-12 DIAGNOSIS — E1142 Type 2 diabetes mellitus with diabetic polyneuropathy: Secondary | ICD-10-CM | POA: Diagnosis not present

## 2024-01-12 DIAGNOSIS — I119 Hypertensive heart disease without heart failure: Secondary | ICD-10-CM | POA: Diagnosis not present

## 2024-01-12 DIAGNOSIS — F419 Anxiety disorder, unspecified: Secondary | ICD-10-CM | POA: Diagnosis not present

## 2024-01-12 MED ORDER — FLUDROCORTISONE ACETATE 0.1 MG PO TABS: 0.1000 mg | ORAL_TABLET | Freq: Every day | ORAL | 3 refills | Status: DC

## 2024-01-12 NOTE — Telephone Encounter (Signed)
 Hi Mr. Nabarro,  Thank you for letting me know that you have increased sodium and fluid intake. You previously were on Florinef  0.1mg  once daily from Dr. Felipe Horton. Recommend we restart this. As you just saw Dr. Lawana Pray, I would recommend leaving appointment with afib clinic as is to follow up on possible ablation. Please monitor your blood pressure at home and notify the office if systolic blood pressure is greater than 150 or less than 100. Recommend following up with me in one month and keeping appointment with Dr. Audery Blazing.  Thanks,  Katlyn West, NP

## 2024-01-12 NOTE — Telephone Encounter (Signed)
 Left message to call back Sent message through my chart and send medication to pharmacy

## 2024-01-16 DIAGNOSIS — E1142 Type 2 diabetes mellitus with diabetic polyneuropathy: Secondary | ICD-10-CM | POA: Diagnosis not present

## 2024-01-16 DIAGNOSIS — F419 Anxiety disorder, unspecified: Secondary | ICD-10-CM | POA: Diagnosis not present

## 2024-01-16 DIAGNOSIS — I088 Other rheumatic multiple valve diseases: Secondary | ICD-10-CM | POA: Diagnosis not present

## 2024-01-16 DIAGNOSIS — I119 Hypertensive heart disease without heart failure: Secondary | ICD-10-CM | POA: Diagnosis not present

## 2024-01-16 DIAGNOSIS — S52691D Other fracture of lower end of right ulna, subsequent encounter for closed fracture with routine healing: Secondary | ICD-10-CM | POA: Diagnosis not present

## 2024-01-16 DIAGNOSIS — F32A Depression, unspecified: Secondary | ICD-10-CM | POA: Diagnosis not present

## 2024-01-16 DIAGNOSIS — I480 Paroxysmal atrial fibrillation: Secondary | ICD-10-CM | POA: Diagnosis not present

## 2024-01-16 DIAGNOSIS — S42291D Other displaced fracture of upper end of right humerus, subsequent encounter for fracture with routine healing: Secondary | ICD-10-CM | POA: Diagnosis not present

## 2024-01-16 DIAGNOSIS — I951 Orthostatic hypotension: Secondary | ICD-10-CM | POA: Diagnosis not present

## 2024-01-19 DIAGNOSIS — Z7901 Long term (current) use of anticoagulants: Secondary | ICD-10-CM | POA: Diagnosis not present

## 2024-01-19 DIAGNOSIS — I4729 Other ventricular tachycardia: Secondary | ICD-10-CM | POA: Diagnosis not present

## 2024-01-19 DIAGNOSIS — Z8679 Personal history of other diseases of the circulatory system: Secondary | ICD-10-CM | POA: Diagnosis not present

## 2024-01-19 DIAGNOSIS — Z79899 Other long term (current) drug therapy: Secondary | ICD-10-CM | POA: Diagnosis not present

## 2024-01-19 DIAGNOSIS — Z9889 Other specified postprocedural states: Secondary | ICD-10-CM | POA: Diagnosis not present

## 2024-01-19 DIAGNOSIS — I10 Essential (primary) hypertension: Secondary | ICD-10-CM | POA: Diagnosis not present

## 2024-01-19 DIAGNOSIS — R001 Bradycardia, unspecified: Secondary | ICD-10-CM | POA: Diagnosis not present

## 2024-01-19 DIAGNOSIS — Z45018 Encounter for adjustment and management of other part of cardiac pacemaker: Secondary | ICD-10-CM | POA: Diagnosis not present

## 2024-01-22 ENCOUNTER — Ambulatory Visit: Payer: Medicare PPO | Admitting: Internal Medicine

## 2024-01-22 ENCOUNTER — Other Ambulatory Visit: Payer: Self-pay | Admitting: *Deleted

## 2024-01-22 ENCOUNTER — Encounter: Payer: Self-pay | Admitting: Cardiology

## 2024-01-22 DIAGNOSIS — I480 Paroxysmal atrial fibrillation: Secondary | ICD-10-CM | POA: Diagnosis not present

## 2024-01-22 DIAGNOSIS — F419 Anxiety disorder, unspecified: Secondary | ICD-10-CM | POA: Diagnosis not present

## 2024-01-22 DIAGNOSIS — I951 Orthostatic hypotension: Secondary | ICD-10-CM | POA: Diagnosis not present

## 2024-01-22 DIAGNOSIS — I088 Other rheumatic multiple valve diseases: Secondary | ICD-10-CM | POA: Diagnosis not present

## 2024-01-22 DIAGNOSIS — I119 Hypertensive heart disease without heart failure: Secondary | ICD-10-CM | POA: Diagnosis not present

## 2024-01-22 DIAGNOSIS — F32A Depression, unspecified: Secondary | ICD-10-CM | POA: Diagnosis not present

## 2024-01-22 DIAGNOSIS — S42291D Other displaced fracture of upper end of right humerus, subsequent encounter for fracture with routine healing: Secondary | ICD-10-CM | POA: Diagnosis not present

## 2024-01-22 DIAGNOSIS — S52691D Other fracture of lower end of right ulna, subsequent encounter for closed fracture with routine healing: Secondary | ICD-10-CM | POA: Diagnosis not present

## 2024-01-22 DIAGNOSIS — E1142 Type 2 diabetes mellitus with diabetic polyneuropathy: Secondary | ICD-10-CM | POA: Diagnosis not present

## 2024-01-22 MED ORDER — APIXABAN 5 MG PO TABS
5.0000 mg | ORAL_TABLET | Freq: Two times a day (BID) | ORAL | 1 refills | Status: DC
Start: 2024-01-22 — End: 2024-07-17

## 2024-01-22 NOTE — Telephone Encounter (Signed)
 Prescription refill request for Eliquis  received. Indication: AF Last office visit: 01/11/24  Almetta Armor MD Scr: 1.20 on 01/01/24  Epic Age: 73 Weight: 104.8kg  Based on above findings Eliquis  5mg  twice daily is the appropriate dose.  Refill approved.

## 2024-01-23 ENCOUNTER — Encounter: Payer: Self-pay | Admitting: Cardiology

## 2024-01-23 ENCOUNTER — Encounter: Payer: Self-pay | Admitting: Internal Medicine

## 2024-01-24 ENCOUNTER — Encounter: Payer: Self-pay | Admitting: Internal Medicine

## 2024-01-24 ENCOUNTER — Ambulatory Visit: Admitting: Internal Medicine

## 2024-01-24 VITALS — BP 136/80 | HR 75 | Temp 97.7°F | Resp 16 | Ht 73.0 in | Wt 227.5 lb

## 2024-01-24 DIAGNOSIS — I951 Orthostatic hypotension: Secondary | ICD-10-CM | POA: Diagnosis not present

## 2024-01-24 DIAGNOSIS — M542 Cervicalgia: Secondary | ICD-10-CM | POA: Diagnosis not present

## 2024-01-24 DIAGNOSIS — G8929 Other chronic pain: Secondary | ICD-10-CM | POA: Diagnosis not present

## 2024-01-24 DIAGNOSIS — F32A Depression, unspecified: Secondary | ICD-10-CM | POA: Diagnosis not present

## 2024-01-24 DIAGNOSIS — Z79899 Other long term (current) drug therapy: Secondary | ICD-10-CM

## 2024-01-24 DIAGNOSIS — S52691D Other fracture of lower end of right ulna, subsequent encounter for closed fracture with routine healing: Secondary | ICD-10-CM | POA: Diagnosis not present

## 2024-01-24 DIAGNOSIS — F1020 Alcohol dependence, uncomplicated: Secondary | ICD-10-CM | POA: Diagnosis not present

## 2024-01-24 DIAGNOSIS — I119 Hypertensive heart disease without heart failure: Secondary | ICD-10-CM | POA: Diagnosis not present

## 2024-01-24 DIAGNOSIS — I480 Paroxysmal atrial fibrillation: Secondary | ICD-10-CM | POA: Diagnosis not present

## 2024-01-24 DIAGNOSIS — E1142 Type 2 diabetes mellitus with diabetic polyneuropathy: Secondary | ICD-10-CM | POA: Diagnosis not present

## 2024-01-24 DIAGNOSIS — I088 Other rheumatic multiple valve diseases: Secondary | ICD-10-CM | POA: Diagnosis not present

## 2024-01-24 DIAGNOSIS — F419 Anxiety disorder, unspecified: Secondary | ICD-10-CM | POA: Diagnosis not present

## 2024-01-24 DIAGNOSIS — S42291D Other displaced fracture of upper end of right humerus, subsequent encounter for fracture with routine healing: Secondary | ICD-10-CM | POA: Diagnosis not present

## 2024-01-24 NOTE — Progress Notes (Signed)
 Subjective:    Patient ID: Lawrence [name]., male    DOB: 01-27-1951, 73 y.o.   MRN: [id number]  DOS:  01/24/2024 Type of visit - description: Follow-up  Follow-up from previous visit, here with his wife. Multiple issues discussed.   Review of Systems See above   Past Medical History:  Diagnosis Date   Anxiety 01/20/2023   Bradycardia    Cancer (HCC) 2013   esophageal cancer   Depression    Dysrhythmia    Afib   GERD (gastroesophageal reflux disease)    H/O hiatal hernia    s/p  repair 06-26-2012   History of esophagectomy 08-21-2012  AT [facility]   History of kidney stones 2006   History of malignant neoplasm of esophagus ONCOLOGIST-  DR D'AMICO AT [facility]-- PER LAST NOTE 01/ 2018  NO RECURRENCE   dx 10/ 2013  Stage 2A (T2 N0)  08-21-2012 s/p  esophagectomy w/ gastric pull-through [facility]) post residual recurrent anastomotic stricture (multiple EGD w/ dilations)   History of transient ischemic attack (TIA) 2003   no residual   Hypertension    Long term (current) use of anticoagulants    xarelto    Neuromuscular disorder (HCC)    neuropathy of feet   On dofetilide  therapy    PAF (paroxysmal atrial fibrillation) (HCC) first dx 2009   primary cardiologist-  dr Kay Parson /  EP cardiologist -- dr Arvell Birchwood ([facility])  s/p  afib ablation 08-02-2016   Pancreatic cyst    x2    Peyronie's disease    Presence of permanent cardiac pacemaker implanted 05-01-2014  at [facility]   Dr.Daubert -[facility] follows   Pulmonary nodule, right    upper and middle lobes --  per last CT stable   Type 2 diabetes mellitus treated with insulin  (HCC)    Wears glasses     Past Surgical History:  Procedure Laterality Date   ANTERIOR CERVICAL DECOMP/DISCECTOMY FUSION N/A 06/02/2016   Procedure: ANTERIOR CERVICAL DECOMPRESSION FUSION CERVICAL 5-6, CERVICAL 6-7 WITH INSTRUMENTATION AND ALLOGRAFT;  Surgeon: Virl Grimes, MD;  Location: [facility] OR;  Service: Orthopedics;  Laterality: N/A;  ANTERIOR  CERVICAL DECOMPRESSION FUSION CERVICAL 5-6, CERVICAL 6-7 WITH INSTRUMENTATION AND ALLOGRAFT   BIOPSY  06/26/2012   Procedure: BIOPSY;  Surgeon: Evander Hills, DO;  Location: [facility] ORS;  Service: General;;   CARDIAC ELECTROPHYSIOLOGY STUDY AND ABLATION  08-02-2016   [facility]   (dr Arvell Birchwood)   pulmonary veins isolation and ablation afib   CARDIAC PACEMAKER PLACEMENT  05-01-2014   [facility]   CHOLECYSTECTOMY  1983   COLONOSCOPY WITH PROPOFOL  N/A 07/13/2015   Procedure: COLONOSCOPY WITH PROPOFOL ;  Surgeon: Garrett Kallman, MD;  Location: [facility] ENDOSCOPY;  Service: Endoscopy;  Laterality: N/A;   ESOPHAGECTOMY  08-21-2012   [facility]   w/ gastric pull-through   ESOPHAGOGASTRODUODENOSCOPY (EGD) WITH ESOPHAGEAL DILATION  multiple x32 per pt approx.--- last one 02-09-2015   recurrent anastomotic stricture post esophagectomy   HIATAL HERNIA REPAIR  06/26/2012   Procedure: LAPAROSCOPIC REPAIR OF HIATAL HERNIA;  Surgeon: Evander Hills, DO;  Location: [facility] ORS;  Service: General;;   HUMERUS IM NAIL Right 12/28/2023   Procedure: INSERTION, INTRAMEDULLARY ROD, HUMERUS;  Surgeon: Sammye Cristal, MD;  Location: [facility] ORS;  Service: Orthopedics;  Laterality: Right;   INCISIONAL HERNIA REPAIR  06/26/2012   Procedure: HERNIA REPAIR INCISIONAL;  Surgeon: Evander Hills, DO;  Location: [facility] ORS;  Service: General;;   INGUINAL HERNIA REPAIR Bilateral 1982   JEJUNOSTOMY FEEDING TUBE  12/22/2012  Removed 12/ 2016  approx.   KNEE ARTHROSCOPY Left 1990   KNEE ARTHROSCOPY Right 12/2019   LAPAROSCOPIC GASTRIC SLEEVE RESECTION  06/2012   started but abandoned d/t discovery of tumor   LAPAROTOMY  07/12/2021   revision of previous gastric conduit, hiatal hernia repair   MASS EXCISION Left 04/04/2013   Procedure: EXCISION OF SCALP MASS;  Surgeon: Evander Hills, DO;  Location: [facility] OR[facility];  Service: General;  Laterality: Left;   pilomatrixoma   NESBIT PROCEDURE N/A 12/09/2016   Procedure: NESBIT PROCEDURE 16 DOT PLICATION;  Surgeon: Mark Ottelin, MD;   Location: [facility];  Service: Urology;  Laterality: N/A;   THORACOTOMY  07/12/2021   revision of previous gastric conduit   TONSILLECTOMY  1959   TRANSTHORACIC ECHOCARDIOGRAM  08-06-2015   [facility]   mild LVH, ef 50%/  mild RAE /  moderate LAE/  trivial AR, MR, PR,  and TR    Current Outpatient Medications  Medication Instructions   albuterol  (VENTOLIN  HFA) 108 (90 Base) MCG/ACT inhaler 2 puffs, Inhalation, Every 6 hours PRN   Alcohol Swabs ([employer] PREP) 70 % PADS See admin instructions   apixaban  (ELIQUIS ) 5 mg, Oral, 2 times daily   budesonide -formoterol  (SYMBICORT ) 160-4.5 MCG/ACT inhaler 2 puffs, Inhalation, 2 times daily   Continuous Glucose Receiver (FREESTYLE LIBRE 3 READER) DEVI by Does not apply route.   ferrous sulfate 325 mg, Daily   fludrocortisone  (FLORINEF ) 0.1 mg, Oral, Daily   FLUoxetine  (PROZAC ) 60 mg, Oral, Daily   HUMULIN  70/30 (70-30) 100 UNIT/ML injection 20 UNITS IN THE MORNING WITH BREAKFAST AND 8 UNITS IN THE EVENING WITH SUPPER.   hydrOXYzine  (ATARAX ) 25 mg, Oral, 2 times daily PRN   Insulin  Syringe-Needle U-100 (BD VEO INSULIN  SYRINGE U/F) 31G X 15/64" 0.3 ML MISC Use as directed in the morning, at noon, in the evening, and at bedtime.   metoprolol  tartrate (LOPRESSOR ) 25 mg, 2 times daily   Multiple Vitamins-Minerals (PRESERVISION AREDS PO) 1 tablet, 2 times daily   oxyCODONE  (OXY IR/ROXICODONE ) 5-10 mg, Oral, Every 4 hours PRN   rosuvastatin  (CRESTOR ) 40 mg, Daily   tiZANidine  (ZANAFLEX ) 4 mg, Oral, Every 8 hours PRN       Objective:   Physical Exam BP 136/80   Pulse 75   Temp 97.7 F (36.5 C) (Oral)   Resp 16   Ht 6\' 1"  (1.854 m)   Wt 227 lb 8 oz (103.2 kg)   SpO2 97%   BMI 30.02 kg/m  General:   Well developed, NAD, BMI noted. HEENT:  Normocephalic . Face symmetric, atraumatic Right arm on a sling Lower extremities: no pretibial edema bilaterally  Skin: Not pale. Not jaundice Neurologic:  alert & oriented X3.  Speech normal,  gait assisted by a cane Psych--  Cognition and judgment appear intact.  Cooperative with normal attention span and concentration.  Behavior appropriate. No anxious or depressed appearing.      Assessment     Problem list: DM- per endo Mild nonproliferative diabetic retinopathy with macular edema. HTN High cholesterol Anxiety  GI: Esophageal neoplasm 2013 found during a gastric bypass  Hiatal hernia: R thoracotomy Laparotomy lysis of adhesions for repair of paraesophageal hernia in 2022  Cardiovascular: A-fib dx 2014, anticoagulated Pacemaker 2015, Ablation @ [facility] 2017 TIA 2003 ?  MRI negative, MRA some atherosclerosis distal R vertebral artery? Orthostatic hypotension CAD: Moderate-per CT coronary angiogram 01/2023, medical mngmt MSK: see surgeries, chronic neck pain on tramadol  EtOH abuse (see OV 12/26/2023)  PLAN Follow-up from last visit. Alcohol abuse: Since LOV, has not drink alcohol, states his wife is making sure of that. Praised. Has not gone to [employer] but has not rule that out. Anxiety: Currently feeling well emotionally, on fluoxetine  60 mg.  He has stopped lorazepam .  I prescribed Atarax  but his pharmacy is advised not to use it in combination with oxycodone .  Plan: Okay to use Atarax  prn for anxiety or insomnia. MSK, chronic neck pain: Due to fracture, he stopped tramadol  and is taking oxycodone  1 tablet at bedtime.  He will finish oxycodone  in few days. Advised patient this is his opportunity to stop narcotics completely and manage his chronic neck pain with Tylenol /heating pad/cool compresses.  He verbalized understanding.  (He does have some tramadol  leftover but again rec to try to avoid taking it).  Advised him not to take NSAIDs d/t anticoagulation. Orthostatic hypotension: Started Fioricet by cardiology Atrial fibrillation: Follow-up  at [facility] but is transferring his cardiac care to [facility].Saw EP at [facility] 01/11/2024, was on A-fib, see note. RTC 4  months, CPX  Extensive discussion about pain management and the opportunity to stop narcotics.  30-minute visit

## 2024-01-24 NOTE — Patient Instructions (Addendum)
 It was good to see you today.  Once you finish oxycodone , take Tylenol  500 mg 2 tablets at nighttime for neck pain. You can also use a heating pad or cold compress.   Next office visit for a physical exam by September 2025 Please make an appointment before you leave today

## 2024-01-24 NOTE — Telephone Encounter (Signed)
 I spoke with [facility] EP clinic at [phone] and let them know that I was still waiting for release through [employer] ([employer]) as I requested on 5/16.  He is still showing as not released for us  to pick up remote monitoring.    Also, I have reached out to AF clinic and they can move his appt up with Afib clinic to this Friday at 230pm to move forward with AF management plans until ablation in August.   Called patient on the phone and discussed his concerns and provided reassurance with mapping out plan of care.  He thanks me for the explanation and is on board with Afib clinic Friday, 5/23 at 2:30pm.

## 2024-01-25 DIAGNOSIS — G8929 Other chronic pain: Secondary | ICD-10-CM | POA: Insufficient documentation

## 2024-01-25 NOTE — Assessment & Plan Note (Signed)
 Follow-up from last visit. Alcohol abuse: Since LOV, has not drink alcohol, states his wife is making sure of that. Praised. Has not gone to [employer] but has not rule that out. Anxiety: Currently feeling well emotionally, on fluoxetine  60 mg.  He has stopped lorazepam .  I prescribed Atarax  but his pharmacy is advised not to use it in combination with oxycodone .  Plan: Okay to use Atarax  prn for anxiety or insomnia. MSK, chronic neck pain: Due to fracture, he stopped tramadol  and is taking oxycodone  1 tablet at bedtime.  He will finish oxycodone  in few days. Advised patient this is his opportunity to stop narcotics completely and manage his chronic neck pain with Tylenol /heating pad/cool compresses.  He verbalized understanding.  (He does have some tramadol  leftover but again rec to try to avoid taking it).  Advised him not to take NSAIDs d/t anticoagulation. Orthostatic hypotension: Started Fioricet by cardiology Atrial fibrillation: Follow-up  at [facility] but is transferring his cardiac care to [facility] health.Saw EP at [facility] 01/11/2024, was on A-fib, see note. RTC 4 months, CPX

## 2024-01-25 NOTE — Telephone Encounter (Signed)
 Patient has now officially been transferred in to our clinic and remote monitoring established.   Forwarding AF information to R. Fenton in AF clinic for update and review of patient care needs.

## 2024-01-26 ENCOUNTER — Other Ambulatory Visit: Payer: Self-pay | Admitting: Internal Medicine

## 2024-01-26 ENCOUNTER — Ambulatory Visit (HOSPITAL_COMMUNITY)
Admission: RE | Admit: 2024-01-26 | Discharge: 2024-01-26 | Disposition: A | Discharge status: Home / Self Care | Source: Ambulatory Visit | Attending: Physician Assistant | Admitting: Physician Assistant

## 2024-01-26 ENCOUNTER — Encounter (HOSPITAL_COMMUNITY): Payer: Self-pay | Admitting: Physician Assistant

## 2024-01-26 VITALS — BP 112/90 | HR 76 | Ht 73.0 in | Wt 227.2 lb

## 2024-01-26 DIAGNOSIS — I480 Paroxysmal atrial fibrillation: Secondary | ICD-10-CM | POA: Diagnosis not present

## 2024-01-26 DIAGNOSIS — I4819 Other persistent atrial fibrillation: Secondary | ICD-10-CM | POA: Diagnosis not present

## 2024-01-26 DIAGNOSIS — D6869 Other thrombophilia: Secondary | ICD-10-CM | POA: Diagnosis not present

## 2024-01-26 DIAGNOSIS — E1165 Type 2 diabetes mellitus with hyperglycemia: Secondary | ICD-10-CM | POA: Diagnosis not present

## 2024-01-26 NOTE — Patient Instructions (Addendum)
 Cardioversion scheduled for: Wed. May 28    - Arrive at the [employer] "A" of [facility] ([address])  and check in with ADMITTING at 10:00am    - Do not eat or drink anything after midnight the night prior to your procedure.   - Take all your morning medication (except diabetic medications) with a sip of water  prior to arrival. HOLD AM INSULIN  until after procedure  - Do NOT miss any doses of your blood thinner - if you should miss a dose or take a dose more than 4 hours late -- please notify our office immediately.  - You will not be able to drive home after your procedure. Please ensure you have a responsible adult to drive you home. You will need someone with you for 24 hours post procedure.     - Expect to be in the procedural area approximately 2 hours.   - If you feel as if you go back into normal rhythm prior to scheduled cardioversion, please notify our office immediately.   If your procedure is canceled in the cardioversion suite you will be charged a cancellation fee.    Hold below medications 7 days prior to scheduled procedure/anesthesia.  Restart medication on the normal dosing day after scheduled procedure/anesthesia  Dulaglutide (Trulicity) Exenatide extended release (Bydureon bcise) Semaglutide (Ozempic) Frederik Jansky)  Tirzepatide (Mounjaro) (Zebound)    Hold below medications 72 hours prior to scheduled procedure/anesthesia. Restart medication on the following day after scheduled procedure/anesthesia Bexagliflozin (Brenzavvy) Canagliflozin (Invokana) Canagliflozin/metformin  (Invokamet/Invokamet XR) Dapagliflozin Elvina Hammers) Dapagliflozin/metformin  (Xigduo XR) Dapagliflozin/saxagliptin Forrest Iha) Empagliflozin (Jardiance) Empagliflozin/linagliptin (Glyxambi) Empagliflozin/linagliptin/metformin  (Trijardy XR) Empagliflozin/metformin  (Synjardy/Synjardy XR) Ertugliflozin (Steglatro) Ertugliflozin/metformin   (Segluromet) Ertugliflozin/sitagliptin (Steglujan)    Hold below medications 24 hours prior to scheduled procedure/anesthesia.   Restart medication on the following day after scheduled procedure/anesthesia   Exenatide (Byetta)  Liraglutide (Victoza, Saxenda)  Lixisenatide (Adlyxin)  Semaglutide (Rybelsus) Polyethylene Glycol Loxenatide   For those patients who have a scheduled procedure/anesthesia on the same day of the week as their dose, hold the medication on the day of surgery.  They can take their scheduled dose the week before.  **Patients on the above medications scheduled for elective procedures that have not held the medication for the appropriate amount of time are at risk of cancellation or change in the anesthetic plan.

## 2024-01-26 NOTE — H&P (View-Only) (Signed)
 Primary Care Physician: Ezell Hollow, MD Primary Cardiologist: Alexandria Angel, MD Electrophysiologist: Will Cortland Ding, MD  Referring Physician: Dr Lorriane Rote. is a 73 y.o. male with a history of CAD, symptomatic bradycardia s/p PPM, TIA, HTN, orthostatic hypotension, HLD, DM, atrial fibrillation who presents for follow up in the [facility].  He had a pacemaker inserted in 2015. He had a A-fib ablation in 2017 at [facility]. Coronary CT in 2024 showed an elevated calcium  score of 1025 with FFR showing no obstructive disease. Echo at the time showed ejection fraction of 55 to 60%. On 12/28/2023 patient underwent intramedullary nail to the right humerus following a proximal humerus shaft fracture.  During hospitalization was noted to be hypotensive, now on florinef . His device has shown persistent afib since 08/2023. Patient is on Eliquis  for stroke prevention.    Patient presents today for follow up for atrial fibrillation. He remains in afib. He continues to have intermittent dizziness. No bleeding issues on anticoagulation.   Today, he denies symptoms of palpitations, chest pain, shortness of breath, orthopnea, PND, lower extremity edema, presyncope, syncope, snoring, daytime somnolence, bleeding, or neurologic sequela. The patient is tolerating medications without difficulties and is otherwise without complaint today.    Atrial Fibrillation Risk Factors:  he does not have symptoms or diagnosis of sleep apnea. he does not have a history of rheumatic fever. he does have a history of alcohol use.   Atrial Fibrillation Management history:  Previous antiarrhythmic drugs: none Previous cardioversions: remotely  Previous ablations: 2017 at [facility] Anticoagulation history: Eliquis   ROS- All systems are reviewed and negative except as per the HPI above.  Past Medical History:  Diagnosis Date   Anxiety 01/20/2023   Bradycardia    Cancer (HCC)  2013   esophageal cancer   Depression    Dysrhythmia    Afib   GERD (gastroesophageal reflux disease)    H/O hiatal hernia    s/p  repair 06-26-2012   History of esophagectomy 08-21-2012  AT [facility]   History of kidney stones 2006   History of malignant neoplasm of esophagus ONCOLOGIST-  DR D'AMICO AT [facility]-- PER LAST NOTE 01/ 2018  NO RECURRENCE   dx 10/ 2013  Stage 2A (T2 N0)  08-21-2012 s/p  esophagectomy w/ gastric pull-through [facility]) post residual recurrent anastomotic stricture (multiple EGD w/ dilations)   History of transient ischemic attack (TIA) 2003   no residual   Hypertension    Long term (current) use of anticoagulants    xarelto    Neuromuscular disorder (HCC)    neuropathy of feet   On dofetilide  therapy    PAF (paroxysmal atrial fibrillation) (HCC) first dx 2009   primary cardiologist-  dr Kay Parson /  EP cardiologist -- dr Arvell Birchwood ([facility])  s/p  afib ablation 08-02-2016   Pancreatic cyst    x2    Peyronie's disease    Presence of permanent cardiac pacemaker implanted 05-01-2014  at [facility]   Dr.Daubert -[facility] follows   Pulmonary nodule, right    upper and middle lobes --  per last CT stable   Type 2 diabetes mellitus treated with insulin  (HCC)    Wears glasses     Current Outpatient Medications  Medication Sig Dispense Refill   albuterol  (VENTOLIN  HFA) 108 (90 Base) MCG/ACT inhaler Inhale 2 puffs into the lungs every 6 (six) hours as needed for wheezing or shortness of breath. 8 g 2  Alcohol Swabs ([employer] PREP) 70 % PADS USE AS DIRECTED 200 each 2   apixaban  (ELIQUIS ) 5 MG TABS tablet Take 1 tablet (5 mg total) by mouth 2 (two) times daily. 180 tablet 1   budesonide -formoterol  (SYMBICORT ) 160-4.5 MCG/ACT inhaler Inhale 2 puffs into the lungs 2 (two) times daily. (Patient taking differently: Inhale 2 puffs into the lungs 2 (two) times daily as needed (Cough).) 1 each 3   Continuous Glucose Receiver (FREESTYLE LIBRE 3 READER) DEVI by Does not apply route.      ferrous sulfate 325 (65 FE) MG tablet Take 325 mg by mouth daily.     fludrocortisone  (FLORINEF ) 0.1 MG tablet Take 1 tablet (0.1 mg total) by mouth daily. 90 tablet 3   FLUoxetine  (PROZAC ) 20 MG tablet Take 3 tablets (60 mg total) by mouth daily. 270 tablet 1   HUMULIN  70/30 (70-30) 100 UNIT/ML injection 20 UNITS IN THE MORNING WITH BREAKFAST AND 8 UNITS IN THE EVENING WITH SUPPER. 10 mL 4   hydrOXYzine  (ATARAX ) 25 MG tablet Take 1 tablet (25 mg total) by mouth 2 (two) times daily as needed for anxiety (Insomnia). 40 tablet 1   Insulin  Syringe-Needle U-100 (BD VEO INSULIN  SYRINGE U/F) 31G X 15/64" 0.3 ML MISC Use as directed in the morning, at noon, in the evening, and at bedtime. 300 each 3   metoprolol  tartrate (LOPRESSOR ) 25 MG tablet Take 25 mg by mouth 2 (two) times daily.     Multiple Vitamins-Minerals (PRESERVISION AREDS PO) Take 1 tablet by mouth in the morning and at bedtime. Preservision AREDS     oxyCODONE  (OXY IR/ROXICODONE ) 5 MG immediate release tablet Take 1-2 tablets (5-10 mg total) by mouth every 4 (four) hours as needed for moderate pain (pain score 4-6) (pain score 4-6). 30 tablet 0   rosuvastatin  (CRESTOR ) 40 MG tablet Take 40 mg by mouth daily.     tiZANidine  (ZANAFLEX ) 4 MG tablet Take 1 tablet (4 mg total) by mouth every 8 (eight) hours as needed. 30 tablet 0   No current facility-administered medications for this encounter.    Physical Exam: BP (!) 112/90   Pulse 76   Ht 6\' 1"  (1.854 m)   Wt 103.1 kg   BMI 29.98 kg/m   GEN: Well nourished, well developed in no acute distress CARDIAC: Regular rate and rhythm, no murmurs, rubs, gallops RESPIRATORY:  Clear to auscultation without rales, wheezing or rhonchi  ABDOMEN: Soft, non-tender, non-distended EXTREMITIES:  No edema; No deformity   Wt Readings from Last 3 Encounters:  01/26/24 103.1 kg  01/24/24 103.2 kg  01/11/24 104.8 kg     EKG today demonstrates  V pacing with underlying afib Vent. rate 76  BPM PR interval * ms QRS duration 146 ms QT/QTcB 436/490 ms  Echo 01/18/23 demonstrated   1. Left ventricular ejection fraction, by estimation, is 55 to 60%. The  left ventricle has normal function. The left ventricle has no regional  wall motion abnormalities. There is mild left ventricular hypertrophy.  Left ventricular diastolic parameters are consistent with Grade II diastolic dysfunction (pseudonormalization). The average left ventricular global longitudinal strain is -11.3 %. The global longitudinal strain is abnormal.   2. Right ventricular systolic function is normal. The right ventricular  size is normal. There is normal pulmonary artery systolic pressure.   3. The mitral valve is normal in structure. No evidence of mitral valve  regurgitation. No evidence of mitral stenosis.   4. The aortic valve is tricuspid. There is mild  calcification of the  aortic valve. There is mild thickening of the aortic valve. Aortic valve  regurgitation is not visualized. Mild aortic valve stenosis. Aortic valve  mean gradient measures 9.0 mmHg.   5. The inferior vena cava is normal in size with greater than 50%  respiratory variability, suggesting right atrial pressure of 3 mmHg.     CHA2DS2-VASc Score = 6  The patient's score is based upon: CHF History: 0 HTN History: 1 Diabetes History: 1 Stroke History: 2 Vascular Disease History: 1 Age Score: 1 Gender Score: 0       ASSESSMENT AND PLAN: Persistent Atrial Fibrillation (ICD10:  I48.19) The patient's CHA2DS2-VASc score is 6, indicating a 9.7% annual risk of stroke.   S/p afib ablation at [facility] 2017 Persistently in afib since December. We discussed rhythm control options. Patient would like to pursue repeat ablation with Dr Lawana Pray, will send message to his office. Short term, we discussed DCCV +/- amiodarone. He would like to try DCCV alone, will arrange. He has not missed any doses of anticoagulation in the past 3 weeks.  Continue  Eliquis  5 mg BID Continue Lopressor  25 mg BID  Secondary Hypercoagulable State (ICD10:  D68.69) The patient is at significant risk for stroke/thromboembolism based upon his CHA2DS2-VASc Score of 6.  Continue Apixaban  (Eliquis ). We discussed Watchman given his recent falls. He would be interested in the future once his rhythm is controlled.   Symptomatic bradycardia S/p PPM, followed by Dr Lawana Pray  CAD No anginal symptoms Followed by Dr Audery Blazing    Follow up in the AF clinic post DCCV.   Informed Consent   Shared Decision Making/Informed Consent The risks (stroke, cardiac arrhythmias rarely resulting in the need for a temporary or permanent pacemaker, skin irritation or burns and complications associated with conscious sedation including aspiration, arrhythmia, respiratory failure and death), benefits (restoration of normal sinus rhythm) and alternatives of a direct current cardioversion were explained in detail to Mr. Sorci and he agrees to proceed.         Myrtha Ates PA-C Afib Clinic [facility] [address] [place], [place] [address] [phone]

## 2024-01-26 NOTE — Progress Notes (Signed)
 Primary Care Physician: Ezell Hollow, MD Primary Cardiologist: Alexandria Angel, MD Electrophysiologist: Will Cortland Ding, MD  Referring Physician: Dr Lorriane Rote. is a 73 y.o. male with a history of CAD, symptomatic bradycardia s/p PPM, TIA, HTN, orthostatic hypotension, HLD, DM, atrial fibrillation who presents for follow up in the [facility].  He had a pacemaker inserted in 2015. He had a A-fib ablation in 2017 at [facility]. Coronary CT in 2024 showed an elevated calcium  score of 1025 with FFR showing no obstructive disease. Echo at the time showed ejection fraction of 55 to 60%. On 12/28/2023 patient underwent intramedullary nail to the right humerus following a proximal humerus shaft fracture.  During hospitalization was noted to be hypotensive, now on florinef . His device has shown persistent afib since 08/2023. Patient is on Eliquis  for stroke prevention.    Patient presents today for follow up for atrial fibrillation. He remains in afib. He continues to have intermittent dizziness. No bleeding issues on anticoagulation.   Today, he denies symptoms of palpitations, chest pain, shortness of breath, orthopnea, PND, lower extremity edema, presyncope, syncope, snoring, daytime somnolence, bleeding, or neurologic sequela. The patient is tolerating medications without difficulties and is otherwise without complaint today.    Atrial Fibrillation Risk Factors:  he does not have symptoms or diagnosis of sleep apnea. he does not have a history of rheumatic fever. he does have a history of alcohol use.   Atrial Fibrillation Management history:  Previous antiarrhythmic drugs: none Previous cardioversions: remotely  Previous ablations: 2017 at [facility] Anticoagulation history: Eliquis   ROS- All systems are reviewed and negative except as per the HPI above.  Past Medical History:  Diagnosis Date   Anxiety 01/20/2023   Bradycardia    Cancer (HCC)  2013   esophageal cancer   Depression    Dysrhythmia    Afib   GERD (gastroesophageal reflux disease)    H/O hiatal hernia    s/p  repair 06-26-2012   History of esophagectomy 08-21-2012  AT [facility]   History of kidney stones 2006   History of malignant neoplasm of esophagus ONCOLOGIST-  DR D'AMICO AT [facility]-- PER LAST NOTE 01/ 2018  NO RECURRENCE   dx 10/ 2013  Stage 2A (T2 N0)  08-21-2012 s/p  esophagectomy w/ gastric pull-through [facility]) post residual recurrent anastomotic stricture (multiple EGD w/ dilations)   History of transient ischemic attack (TIA) 2003   no residual   Hypertension    Long term (current) use of anticoagulants    xarelto    Neuromuscular disorder (HCC)    neuropathy of feet   On dofetilide  therapy    PAF (paroxysmal atrial fibrillation) (HCC) first dx 2009   primary cardiologist-  dr Kay Parson /  EP cardiologist -- dr Arvell Birchwood ([facility])  s/p  afib ablation 08-02-2016   Pancreatic cyst    x2    Peyronie's disease    Presence of permanent cardiac pacemaker implanted 05-01-2014  at [facility]   Dr.Daubert -[facility] follows   Pulmonary nodule, right    upper and middle lobes --  per last CT stable   Type 2 diabetes mellitus treated with insulin  (HCC)    Wears glasses     Current Outpatient Medications  Medication Sig Dispense Refill   albuterol  (VENTOLIN  HFA) 108 (90 Base) MCG/ACT inhaler Inhale 2 puffs into the lungs every 6 (six) hours as needed for wheezing or shortness of breath. 8 g 2  Alcohol Swabs ([employer] PREP) 70 % PADS USE AS DIRECTED 200 each 2   apixaban  (ELIQUIS ) 5 MG TABS tablet Take 1 tablet (5 mg total) by mouth 2 (two) times daily. 180 tablet 1   budesonide -formoterol  (SYMBICORT ) 160-4.5 MCG/ACT inhaler Inhale 2 puffs into the lungs 2 (two) times daily. (Patient taking differently: Inhale 2 puffs into the lungs 2 (two) times daily as needed (Cough).) 1 each 3   Continuous Glucose Receiver (FREESTYLE LIBRE 3 READER) DEVI by Does not apply route.      ferrous sulfate 325 (65 FE) MG tablet Take 325 mg by mouth daily.     fludrocortisone  (FLORINEF ) 0.1 MG tablet Take 1 tablet (0.1 mg total) by mouth daily. 90 tablet 3   FLUoxetine  (PROZAC ) 20 MG tablet Take 3 tablets (60 mg total) by mouth daily. 270 tablet 1   HUMULIN  70/30 (70-30) 100 UNIT/ML injection 20 UNITS IN THE MORNING WITH BREAKFAST AND 8 UNITS IN THE EVENING WITH SUPPER. 10 mL 4   hydrOXYzine  (ATARAX ) 25 MG tablet Take 1 tablet (25 mg total) by mouth 2 (two) times daily as needed for anxiety (Insomnia). 40 tablet 1   Insulin  Syringe-Needle U-100 (BD VEO INSULIN  SYRINGE U/F) 31G X 15/64" 0.3 ML MISC Use as directed in the morning, at noon, in the evening, and at bedtime. 300 each 3   metoprolol  tartrate (LOPRESSOR ) 25 MG tablet Take 25 mg by mouth 2 (two) times daily.     Multiple Vitamins-Minerals (PRESERVISION AREDS PO) Take 1 tablet by mouth in the morning and at bedtime. Preservision AREDS     oxyCODONE  (OXY IR/ROXICODONE ) 5 MG immediate release tablet Take 1-2 tablets (5-10 mg total) by mouth every 4 (four) hours as needed for moderate pain (pain score 4-6) (pain score 4-6). 30 tablet 0   rosuvastatin  (CRESTOR ) 40 MG tablet Take 40 mg by mouth daily.     tiZANidine  (ZANAFLEX ) 4 MG tablet Take 1 tablet (4 mg total) by mouth every 8 (eight) hours as needed. 30 tablet 0   No current facility-administered medications for this encounter.    Physical Exam: BP (!) 112/90   Pulse 76   Ht 6\' 1"  (1.854 m)   Wt 103.1 kg   BMI 29.98 kg/m   GEN: Well nourished, well developed in no acute distress CARDIAC: Regular rate and rhythm, no murmurs, rubs, gallops RESPIRATORY:  Clear to auscultation without rales, wheezing or rhonchi  ABDOMEN: Soft, non-tender, non-distended EXTREMITIES:  No edema; No deformity   Wt Readings from Last 3 Encounters:  01/26/24 103.1 kg  01/24/24 103.2 kg  01/11/24 104.8 kg     EKG today demonstrates  V pacing with underlying afib Vent. rate 76  BPM PR interval * ms QRS duration 146 ms QT/QTcB 436/490 ms  Echo 01/18/23 demonstrated   1. Left ventricular ejection fraction, by estimation, is 55 to 60%. The  left ventricle has normal function. The left ventricle has no regional  wall motion abnormalities. There is mild left ventricular hypertrophy.  Left ventricular diastolic parameters are consistent with Grade II diastolic dysfunction (pseudonormalization). The average left ventricular global longitudinal strain is -11.3 %. The global longitudinal strain is abnormal.   2. Right ventricular systolic function is normal. The right ventricular  size is normal. There is normal pulmonary artery systolic pressure.   3. The mitral valve is normal in structure. No evidence of mitral valve  regurgitation. No evidence of mitral stenosis.   4. The aortic valve is tricuspid. There is mild  calcification of the  aortic valve. There is mild thickening of the aortic valve. Aortic valve  regurgitation is not visualized. Mild aortic valve stenosis. Aortic valve  mean gradient measures 9.0 mmHg.   5. The inferior vena cava is normal in size with greater than 50%  respiratory variability, suggesting right atrial pressure of 3 mmHg.     CHA2DS2-VASc Score = 6  The patient's score is based upon: CHF History: 0 HTN History: 1 Diabetes History: 1 Stroke History: 2 Vascular Disease History: 1 Age Score: 1 Gender Score: 0       ASSESSMENT AND PLAN: Persistent Atrial Fibrillation (ICD10:  I48.19) The patient's CHA2DS2-VASc score is 6, indicating a 9.7% annual risk of stroke.   S/p afib ablation at [facility] 2017 Persistently in afib since December. We discussed rhythm control options. Patient would like to pursue repeat ablation with Dr Lawana Pray, will send message to his office. Short term, we discussed DCCV +/- amiodarone. He would like to try DCCV alone, will arrange. He has not missed any doses of anticoagulation in the past 3 weeks.  Continue  Eliquis  5 mg BID Continue Lopressor  25 mg BID  Secondary Hypercoagulable State (ICD10:  D68.69) The patient is at significant risk for stroke/thromboembolism based upon his CHA2DS2-VASc Score of 6.  Continue Apixaban  (Eliquis ). We discussed Watchman given his recent falls. He would be interested in the future once his rhythm is controlled.   Symptomatic bradycardia S/p PPM, followed by Dr Lawana Pray  CAD No anginal symptoms Followed by Dr Audery Blazing    Follow up in the AF clinic post DCCV.   Informed Consent   Shared Decision Making/Informed Consent The risks (stroke, cardiac arrhythmias rarely resulting in the need for a temporary or permanent pacemaker, skin irritation or burns and complications associated with conscious sedation including aspiration, arrhythmia, respiratory failure and death), benefits (restoration of normal sinus rhythm) and alternatives of a direct current cardioversion were explained in detail to Mr. Lawrence [name] and he agrees to proceed.         Myrtha Ates PA-C Afib Clinic [facility] [address] [place], [place] [address] [phone]

## 2024-01-30 DIAGNOSIS — I119 Hypertensive heart disease without heart failure: Secondary | ICD-10-CM | POA: Diagnosis not present

## 2024-01-30 DIAGNOSIS — F32A Depression, unspecified: Secondary | ICD-10-CM | POA: Diagnosis not present

## 2024-01-30 DIAGNOSIS — S42291D Other displaced fracture of upper end of right humerus, subsequent encounter for fracture with routine healing: Secondary | ICD-10-CM | POA: Diagnosis not present

## 2024-01-30 DIAGNOSIS — I088 Other rheumatic multiple valve diseases: Secondary | ICD-10-CM | POA: Diagnosis not present

## 2024-01-30 DIAGNOSIS — E1142 Type 2 diabetes mellitus with diabetic polyneuropathy: Secondary | ICD-10-CM | POA: Diagnosis not present

## 2024-01-30 DIAGNOSIS — S52691D Other fracture of lower end of right ulna, subsequent encounter for closed fracture with routine healing: Secondary | ICD-10-CM | POA: Diagnosis not present

## 2024-01-30 DIAGNOSIS — F419 Anxiety disorder, unspecified: Secondary | ICD-10-CM | POA: Diagnosis not present

## 2024-01-30 DIAGNOSIS — I480 Paroxysmal atrial fibrillation: Secondary | ICD-10-CM | POA: Diagnosis not present

## 2024-01-30 DIAGNOSIS — I951 Orthostatic hypotension: Secondary | ICD-10-CM | POA: Diagnosis not present

## 2024-01-30 NOTE — Progress Notes (Signed)
 Called patient with pre-procedure instructions for tomorrow.   Patient informed of:   Time to arrive for procedure. 1000 Remain NPO past midnight.  Must have a ride home and a responsible adult to remain with them for 24 hours post procedure.  Confirmed blood thinner. Eliquis Confirmed no breaks in taking blood thinner for 3+ weeks prior to procedure. Confirmed patient stopped all GLP-1s and GLP-2s for at least one week before procedure.

## 2024-01-31 ENCOUNTER — Ambulatory Visit (HOSPITAL_COMMUNITY): Admitting: Internal Medicine

## 2024-01-31 ENCOUNTER — Ambulatory Visit (HOSPITAL_COMMUNITY): Payer: Self-pay

## 2024-01-31 ENCOUNTER — Ambulatory Visit (HOSPITAL_COMMUNITY)
Admission: RE | Admit: 2024-01-31 | Discharge: 2024-01-31 | Disposition: A | Discharge status: Home / Self Care | Attending: Cardiology | Admitting: Cardiology

## 2024-01-31 ENCOUNTER — Encounter (HOSPITAL_COMMUNITY): Payer: Self-pay | Admitting: Cardiology

## 2024-01-31 ENCOUNTER — Encounter (HOSPITAL_COMMUNITY)
Admission: RE | Disposition: A | Discharge status: Home / Self Care | Payer: Self-pay | Source: Home / Self Care | Attending: Cardiology

## 2024-01-31 ENCOUNTER — Other Ambulatory Visit: Payer: Self-pay

## 2024-01-31 DIAGNOSIS — E119 Type 2 diabetes mellitus without complications: Secondary | ICD-10-CM | POA: Insufficient documentation

## 2024-01-31 DIAGNOSIS — Z950 Presence of cardiac pacemaker: Secondary | ICD-10-CM | POA: Insufficient documentation

## 2024-01-31 DIAGNOSIS — Z8673 Personal history of transient ischemic attack (TIA), and cerebral infarction without residual deficits: Secondary | ICD-10-CM | POA: Diagnosis not present

## 2024-01-31 DIAGNOSIS — I4819 Other persistent atrial fibrillation: Secondary | ICD-10-CM | POA: Insufficient documentation

## 2024-01-31 DIAGNOSIS — Z794 Long term (current) use of insulin: Secondary | ICD-10-CM | POA: Diagnosis not present

## 2024-01-31 DIAGNOSIS — Z79899 Other long term (current) drug therapy: Secondary | ICD-10-CM | POA: Diagnosis not present

## 2024-01-31 DIAGNOSIS — I10 Essential (primary) hypertension: Secondary | ICD-10-CM

## 2024-01-31 DIAGNOSIS — I2510 Atherosclerotic heart disease of native coronary artery without angina pectoris: Secondary | ICD-10-CM | POA: Insufficient documentation

## 2024-01-31 DIAGNOSIS — R001 Bradycardia, unspecified: Secondary | ICD-10-CM | POA: Insufficient documentation

## 2024-01-31 DIAGNOSIS — E785 Hyperlipidemia, unspecified: Secondary | ICD-10-CM | POA: Insufficient documentation

## 2024-01-31 DIAGNOSIS — D6869 Other thrombophilia: Secondary | ICD-10-CM | POA: Insufficient documentation

## 2024-01-31 DIAGNOSIS — K219 Gastro-esophageal reflux disease without esophagitis: Secondary | ICD-10-CM | POA: Insufficient documentation

## 2024-01-31 DIAGNOSIS — I951 Orthostatic hypotension: Secondary | ICD-10-CM | POA: Insufficient documentation

## 2024-01-31 DIAGNOSIS — Z7901 Long term (current) use of anticoagulants: Secondary | ICD-10-CM | POA: Diagnosis not present

## 2024-01-31 HISTORY — PX: CARDIOVERSION: EP1203

## 2024-01-31 SURGERY — CARDIOVERSION (CATH LAB)
Anesthesia: Monitor Anesthesia Care

## 2024-01-31 MED ORDER — SODIUM CHLORIDE 0.9% FLUSH: 3.0000 mL | Freq: Two times a day (BID) | INTRAVENOUS | Status: DC

## 2024-01-31 MED ORDER — PROPOFOL 10 MG/ML IV BOLUS
INTRAVENOUS | Status: DC | PRN
  Administered 2024-01-31: 10 mg via INTRAVENOUS
  Administered 2024-01-31: 40 mg via INTRAVENOUS
  Administered 2024-01-31: 20 mg via INTRAVENOUS
  Administered 2024-01-31: 30 mg via INTRAVENOUS

## 2024-01-31 MED ORDER — LIDOCAINE 2% (20 MG/ML) 5 ML SYRINGE
INTRAMUSCULAR | Status: DC | PRN
  Administered 2024-01-31: 20 mg via INTRAVENOUS

## 2024-01-31 MED ORDER — SODIUM CHLORIDE 0.9% FLUSH: 3.0000 mL | INTRAVENOUS | Status: DC | PRN

## 2024-01-31 SURGICAL SUPPLY — 1 items: PAD DEFIB RADIO PHYSIO CONN (PAD) ×1 IMPLANT

## 2024-01-31 NOTE — CV Procedure (Signed)
 Procedure:   DCCV  Indication:  Symptomatic atrial fibrillation  Procedure Note:  The patient signed informed consent.  They have had had therapeutic anticoagulation with apixaban  greater than 3 weeks.  Anesthesia was supervised by Dr. Brain Cahill.  Patient received 20 mg IV lidocaine  and 100 mg IV propofol .Adequate airway was maintained throughout and vital followed per protocol.  They were cardioverted x 1 with 200J of biphasic synchronized energy.  They converted initially to atrial paced, ventricular paced rhythm and then was atrial paced, ventricular sensed rhythm.  There were no apparent complications.  The patient had normal neuro status and respiratory status post procedure with vitals stable as recorded elsewhere.    Follow up:  They will continue on current medical therapy and follow up with cardiology as scheduled.  Sheryle Donning, MD PhD 01/31/2024 9:53 AM

## 2024-01-31 NOTE — Interval H&P Note (Signed)
 History and Physical Interval Note:  01/31/2024 9:36 AM  Lawrence [name].  has presented today for surgery, with the diagnosis of AFIB.  The various methods of treatment have been discussed with the patient and family. After consideration of risks, benefits and other options for treatment, the patient has consented to  Procedure(s): CARDIOVERSION (N/A) as a surgical intervention.  The patient's history has been reviewed, patient examined, no change in status, stable for surgery.  I have reviewed the patient's chart and labs.  Questions were answered to the patient's satisfaction.     [name] [name]

## 2024-01-31 NOTE — Transfer of Care (Signed)
 Immediate Anesthesia Transfer of Care Note  Patient: Lawrence [name].  Procedure(s) Performed: CARDIOVERSION  Patient Location: Cath Lab  Anesthesia Type:MAC  Level of Consciousness: awake and drowsy  Airway & Oxygen Therapy: Patient Spontanous Breathing and Patient connected to nasal cannula oxygen  Post-op Assessment: Report given to RN and Post -op Vital signs reviewed and stable  Post vital signs: Reviewed and stable  Last Vitals:  Vitals Value Taken Time  BP    Temp    Pulse    Resp    SpO2      Last Pain: There were no vitals filed for this visit.       Complications: No notable events documented.

## 2024-01-31 NOTE — Discharge Instructions (Signed)

## 2024-02-01 ENCOUNTER — Telehealth: Payer: Self-pay

## 2024-02-01 DIAGNOSIS — F419 Anxiety disorder, unspecified: Secondary | ICD-10-CM | POA: Diagnosis not present

## 2024-02-01 DIAGNOSIS — S52691D Other fracture of lower end of right ulna, subsequent encounter for closed fracture with routine healing: Secondary | ICD-10-CM | POA: Diagnosis not present

## 2024-02-01 DIAGNOSIS — F32A Depression, unspecified: Secondary | ICD-10-CM | POA: Diagnosis not present

## 2024-02-01 DIAGNOSIS — S42291D Other displaced fracture of upper end of right humerus, subsequent encounter for fracture with routine healing: Secondary | ICD-10-CM | POA: Diagnosis not present

## 2024-02-01 DIAGNOSIS — I951 Orthostatic hypotension: Secondary | ICD-10-CM | POA: Diagnosis not present

## 2024-02-01 DIAGNOSIS — I088 Other rheumatic multiple valve diseases: Secondary | ICD-10-CM | POA: Diagnosis not present

## 2024-02-01 DIAGNOSIS — I4819 Other persistent atrial fibrillation: Secondary | ICD-10-CM

## 2024-02-01 DIAGNOSIS — I480 Paroxysmal atrial fibrillation: Secondary | ICD-10-CM | POA: Diagnosis not present

## 2024-02-01 DIAGNOSIS — I119 Hypertensive heart disease without heart failure: Secondary | ICD-10-CM | POA: Diagnosis not present

## 2024-02-01 DIAGNOSIS — E1142 Type 2 diabetes mellitus with diabetic polyneuropathy: Secondary | ICD-10-CM | POA: Diagnosis not present

## 2024-02-01 NOTE — Telephone Encounter (Signed)
 Spoke with pt and scheduled him for Afib Ablation with Dr. Lawana Pray on 8/27 at 8:30 am.   CT is scheduled on 8/6 at 8:30...  He will get updated labs on 7/28 after appt with Dr. Audery Blazing.   I will send instruction letters via MyChart once completed.

## 2024-02-05 ENCOUNTER — Telehealth: Payer: Self-pay

## 2024-02-05 ENCOUNTER — Other Ambulatory Visit: Payer: Self-pay | Admitting: Internal Medicine

## 2024-02-05 DIAGNOSIS — I119 Hypertensive heart disease without heart failure: Secondary | ICD-10-CM | POA: Diagnosis not present

## 2024-02-05 DIAGNOSIS — I951 Orthostatic hypotension: Secondary | ICD-10-CM | POA: Diagnosis not present

## 2024-02-05 DIAGNOSIS — I480 Paroxysmal atrial fibrillation: Secondary | ICD-10-CM | POA: Diagnosis not present

## 2024-02-05 DIAGNOSIS — E1142 Type 2 diabetes mellitus with diabetic polyneuropathy: Secondary | ICD-10-CM | POA: Diagnosis not present

## 2024-02-05 DIAGNOSIS — S42291D Other displaced fracture of upper end of right humerus, subsequent encounter for fracture with routine healing: Secondary | ICD-10-CM | POA: Diagnosis not present

## 2024-02-05 DIAGNOSIS — F419 Anxiety disorder, unspecified: Secondary | ICD-10-CM | POA: Diagnosis not present

## 2024-02-05 DIAGNOSIS — F32A Depression, unspecified: Secondary | ICD-10-CM | POA: Diagnosis not present

## 2024-02-05 DIAGNOSIS — M25511 Pain in right shoulder: Secondary | ICD-10-CM | POA: Diagnosis not present

## 2024-02-05 DIAGNOSIS — S52691D Other fracture of lower end of right ulna, subsequent encounter for closed fracture with routine healing: Secondary | ICD-10-CM | POA: Diagnosis not present

## 2024-02-05 DIAGNOSIS — I088 Other rheumatic multiple valve diseases: Secondary | ICD-10-CM | POA: Diagnosis not present

## 2024-02-05 DIAGNOSIS — M25531 Pain in right wrist: Secondary | ICD-10-CM | POA: Diagnosis not present

## 2024-02-05 NOTE — Anesthesia Preprocedure Evaluation (Addendum)
 Anesthesia Evaluation  Patient identified by MRN, date of birth, ID band Patient awake    Reviewed: Allergy & Precautions, NPO status , Patient's Chart, lab work & pertinent test results  Airway Mallampati: II  TM Distance: >3 FB Neck ROM: Full    Dental  (+) Dental Advisory Given   Pulmonary neg pulmonary ROS   breath sounds clear to auscultation       Cardiovascular hypertension, Pt. on medications and Pt. on home beta blockers + dysrhythmias Atrial Fibrillation + pacemaker  Rhythm:Irregular    1. Left ventricular ejection fraction, by estimation, is 55 to 60%. The left ventricle has normal function. The left ventricle has no regional wall motion abnormalities. There is mild left ventricular hypertrophy. Left ventricular diastolic parameters are consistent with Grade II diastolic dysfunction (pseudonormalization). The average left ventricular global longitudinal strain is -11.3 %. The global longitudinal strain is abnormal.  2. Right ventricular systolic function is normal. The right ventricular size is normal. There is normal pulmonary artery systolic pressure.  3. The mitral valve is normal in structure. No evidence of mitral valve regurgitation. No evidence of mitral stenosis.  4. The aortic valve is tricuspid. There is mild calcification of the aortic valve. There is mild thickening of the aortic valve. Aortic valve regurgitation is not visualized. Mild aortic valve stenosis. Aortic valve mean gradient measures 9.0 mmHg.  5. The inferior vena cava is normal in size with greater than 50% respiratory variability, suggesting right atrial pressure of 3 mmHg.    Neuro/Psych   Anxiety Depression    TIA   GI/Hepatic Neg liver ROS, hiatal hernia,GERD  ,,  Endo/Other  diabetes, Type 2, Insulin  Dependent    Renal/GU negative Renal ROS     Musculoskeletal negative musculoskeletal ROS (+)    Abdominal   Peds  Hematology  (+)  Blood dyscrasia, anemia   Anesthesia Other Findings   Reproductive/Obstetrics                             Anesthesia Physical Anesthesia Plan  ASA: 3  Anesthesia Plan: General   Post-op Pain Management:    Induction: Intravenous  PONV Risk Score and Plan: 0 and Treatment may vary due to age or medical condition  Airway Management Planned: Mask, Nasal Cannula, Natural Airway and Simple Face Mask  Additional Equipment: None  Intra-op Plan:   Post-operative Plan:   Informed Consent: I have reviewed the patients History and Physical, chart, labs and discussed the procedure including the risks, benefits and alternatives for the proposed anesthesia with the patient or authorized representative who has indicated his/her understanding and acceptance.     Dental advisory given  Plan Discussed with: CRNA  Anesthesia Plan Comments:        Anesthesia Quick Evaluation

## 2024-02-05 NOTE — Telephone Encounter (Signed)
 Monthly battery checks added in [facility]. Patient called to advise dates. Will send via mychart also.

## 2024-02-05 NOTE — Anesthesia Postprocedure Evaluation (Addendum)
 Anesthesia Post Note  Patient: Lawrence [name].  Procedure(s) Performed: CARDIOVERSION     Patient location during evaluation: Cath Lab Anesthesia Type: General Level of consciousness: awake and alert Pain management: pain level controlled Vital Signs Assessment: post-procedure vital signs reviewed and stable Respiratory status: spontaneous breathing, nonlabored ventilation and respiratory function stable Cardiovascular status: blood pressure returned to baseline and stable Postop Assessment: [name] apparent nausea or vomiting Anesthetic complications: [name]   [name] notable events documented.  Last Vitals:  Vitals:   01/31/24 1010 01/31/24 1015  BP: 128/75 134/78  Pulse: 85 69  Resp: 16 14  Temp:    SpO2: 94% 96%    Last Pain:  Vitals:   01/31/24 0954  TempSrc: Temporal  PainSc: 0-[name] pain                 Masin Shatto

## 2024-02-06 DIAGNOSIS — F419 Anxiety disorder, unspecified: Secondary | ICD-10-CM | POA: Diagnosis not present

## 2024-02-06 DIAGNOSIS — F32A Depression, unspecified: Secondary | ICD-10-CM | POA: Diagnosis not present

## 2024-02-06 DIAGNOSIS — I951 Orthostatic hypotension: Secondary | ICD-10-CM | POA: Diagnosis not present

## 2024-02-06 DIAGNOSIS — S42291D Other displaced fracture of upper end of right humerus, subsequent encounter for fracture with routine healing: Secondary | ICD-10-CM | POA: Diagnosis not present

## 2024-02-06 DIAGNOSIS — I119 Hypertensive heart disease without heart failure: Secondary | ICD-10-CM | POA: Diagnosis not present

## 2024-02-06 DIAGNOSIS — I480 Paroxysmal atrial fibrillation: Secondary | ICD-10-CM | POA: Diagnosis not present

## 2024-02-06 DIAGNOSIS — S52691D Other fracture of lower end of right ulna, subsequent encounter for closed fracture with routine healing: Secondary | ICD-10-CM | POA: Diagnosis not present

## 2024-02-06 DIAGNOSIS — I088 Other rheumatic multiple valve diseases: Secondary | ICD-10-CM | POA: Diagnosis not present

## 2024-02-06 DIAGNOSIS — E1142 Type 2 diabetes mellitus with diabetic polyneuropathy: Secondary | ICD-10-CM | POA: Diagnosis not present

## 2024-02-07 ENCOUNTER — Ambulatory Visit: Payer: Self-pay | Admitting: Endocrinology

## 2024-02-07 ENCOUNTER — Encounter: Payer: Self-pay | Admitting: Endocrinology

## 2024-02-07 ENCOUNTER — Ambulatory Visit: Admitting: Endocrinology

## 2024-02-07 VITALS — BP 130/80 | HR 76 | Resp 20 | Ht 73.0 in | Wt 225.2 lb

## 2024-02-07 DIAGNOSIS — M25631 Stiffness of right wrist, not elsewhere classified: Secondary | ICD-10-CM | POA: Diagnosis not present

## 2024-02-07 DIAGNOSIS — E1142 Type 2 diabetes mellitus with diabetic polyneuropathy: Secondary | ICD-10-CM | POA: Diagnosis not present

## 2024-02-07 DIAGNOSIS — Z794 Long term (current) use of insulin: Secondary | ICD-10-CM | POA: Diagnosis not present

## 2024-02-07 DIAGNOSIS — R531 Weakness: Secondary | ICD-10-CM | POA: Diagnosis not present

## 2024-02-07 DIAGNOSIS — M25621 Stiffness of right elbow, not elsewhere classified: Secondary | ICD-10-CM | POA: Diagnosis not present

## 2024-02-07 DIAGNOSIS — M25611 Stiffness of right shoulder, not elsewhere classified: Secondary | ICD-10-CM | POA: Diagnosis not present

## 2024-02-07 LAB — POCT GLYCOSYLATED HEMOGLOBIN (HGB A1C): Hemoglobin A1C: 6.9 % — AB (ref 4.0–5.6)

## 2024-02-07 MED ORDER — HUMULIN 70/30 (70-30) 100 UNIT/ML ~~LOC~~ SUSP: SUBCUTANEOUS | 4 refills | Status: DC

## 2024-02-07 NOTE — Progress Notes (Signed)
 Outpatient Endocrinology Note Lawrence Tulsi Crossett, MD  02/07/24  Patient's Name: Lawrence [name].    DOB: 26-Mar-1951    MRN: [id number]                                                    REASON OF VISIT: Follow-up of type 2 diabetes mellitus   REFERRING PROVIDER: Ezell Hollow, MD  PCP: Ezell Hollow, MD  HISTORY OF PRESENT ILLNESS:   Lawrence Dirr. is a 73 y.o. old male with past medical history listed below, is here for evaluation of  type 2 diabetes mellitus.   Pertinent Diabetes History: Patient was diagnosed with type 2 diabetes mellitus in 2013 at that time hemoglobin A1c was 8.6%.  From 2022 he was following with [facility] for the diabetes care, he was hospitalized postoperatively at that time for surgical intervention for post esophagectomy anastomotic leak, he was also doing telemedicine visits as well, he was last time seen in March 2024. He wants to transfer over his diabetes care in this clinic, was initially seen in August 2024.  Patient has history of stage II esophageal adenocarcinoma status post exploratory laparotomy for transhiatal esophagogastrectomy in 2013.  Chronic Diabetes Complications : Retinopathy: yes. Following with ophthalmology / retinal specialist, every 6 months.  Nephropathy: no Peripheral neuropathy: yes, numbness and tingling + Coronary artery disease: no Stroke: no  Relevant comorbidities and cardiovascular risk factors: Obesity: yes Body mass index is 29.71 kg/m.  Hypertension: yes Hyperlipidemia. Yes, on statin   Current / Home Diabetic regimen includes:  Humulin  70/30 insulin  20 units with breakfast and 8 units with supper.  Prior diabetic medications: Metformin , stopped due to diarrhea.   Glycemic data:    CONTINUOUS GLUCOSE MONITORING SYSTEM (CGMS) INTERPRETATION: At today's visit, we reviewed CGM downloads. The full report is scanned in the media. Reviewing the CGM trends, blood glucose are as follows:  FreeStyle Libre 2  CGM-  Sensor Download (Sensor download was reviewed and summarized below.) Dates: May 22 to February 07, 2024, 14 days Sensor Average: 177 Glucose Management Indicator: 7.5%  % data captured: 58%    Interpretation: Random hyperglycemia with blood sugar of 270-300 range postprandially especially in the morning related to morning meal and sometime with supper up to 300 range.  Most of the other time acceptable blood sugar.  Occasional hypoglycemia with blood sugar 60s in the early morning.  He has been taking insulin  usually after meals.  Hypoglycemia: Patient has no hypoglycemic episodes. Patient has hypoglycemia awareness.  Factors modifying glucose control: 1.  Diabetic diet assessment: He generally eats a small frequent meals.  He has habits of drinking wine daily and cutting down.  2.  Staying active or exercising:   3.  Medication compliance: compliant all of the time.  # Elevated alkaline phosphatase likely related to liver disorder:  -Patient has elevated alkaline phosphatase since August 2023.  Previously he had normal alkaline phosphatase.. Bone scan showed overall increased tracer uptake throughout the axial and appendicular skeleton raising question of metabolic bone disease.  Alkaline phosphatase 138 to 171 with upper normal limit of 126.  Mildly elevated liver enzyme AST and ALT.  Elevated GGT, 1 36-141 with upper normal limit of 65. -He has normal serum calcium .  Normal vitamin D  level.  Normal PSA. -Patient has been drinking  alcohol significantly mainly wine almost every day. -Patient has been following with gastroenterology at [facility] gastro[facility]ology.  Interval history  FreeStyle Libre 2 CGM data as reviewed above.  Hemoglobin A1c 6.9%.  He has occasional numbness and ting of the feet.  He has been regularly following with retina specialist.  He has no other complaints today.  Diabetes regimen as reviewed above, he is generally taking insulin  after meal and sometime right  before he stopped eating.  REVIEW OF SYSTEMS As per history of present illness.   PAST MEDICAL HISTORY: Past Medical History:  Diagnosis Date   Anxiety 01/20/2023   Bradycardia    Cancer (HCC) 2013   esophageal cancer   Depression    Dysrhythmia    Afib   GERD (gastroesophageal reflux disease)    H/O hiatal hernia    s/p  repair 06-26-2012   History of esophagectomy 08-21-2012  AT [facility]   History of kidney stones 2006   History of malignant neoplasm of esophagus ONCOLOGIST-  DR D'AMICO AT [facility]-- PER LAST NOTE 01/ 2018  NO RECURRENCE   dx 10/ 2013  Stage 2A (T2 N0)  08-21-2012 s/p  esophagectomy w/ gastric pull-through [facility]) post residual recurrent anastomotic stricture (multiple EGD w/ dilations)   History of transient ischemic attack (TIA) 2003   no residual   Hypertension    Long term (current) use of anticoagulants    xarelto    Neuromuscular disorder (HCC)    neuropathy of feet   On dofetilide  therapy    PAF (paroxysmal atrial fibrillation) (HCC) first dx 2009   primary cardiologist-  dr Kay Parson /  EP cardiologist -- dr Arvell Birchwood ([facility])  s/p  afib ablation 08-02-2016   Pancreatic cyst    x2    Peyronie's disease    Presence of permanent cardiac pacemaker implanted 05-01-2014  at [facility]   Dr.Daubert -[facility] follows   Pulmonary nodule, right    upper and middle lobes --  per last CT stable   Type 2 diabetes mellitus treated with insulin  (HCC)    Wears glasses     PAST SURGICAL HISTORY: Past Surgical History:  Procedure Laterality Date   ANTERIOR CERVICAL DECOMP/DISCECTOMY FUSION N/A 06/02/2016   Procedure: ANTERIOR CERVICAL DECOMPRESSION FUSION CERVICAL 5-6, CERVICAL 6-7 WITH INSTRUMENTATION AND ALLOGRAFT;  Surgeon: Virl Grimes, MD;  Location: [facility] OR;  Service: Orthopedics;  Laterality: N/A;  ANTERIOR CERVICAL DECOMPRESSION FUSION CERVICAL 5-6, CERVICAL 6-7 WITH INSTRUMENTATION AND ALLOGRAFT   BIOPSY  06/26/2012   Procedure: BIOPSY;  Surgeon: Evander Hills, DO;  Location: [facility] ORS;  Service: General;;   CARDIAC ELECTROPHYSIOLOGY STUDY AND ABLATION  08-02-2016   [facility]   (dr Arvell Birchwood)   pulmonary veins isolation and ablation afib   CARDIAC PACEMAKER PLACEMENT  05-01-2014   [facility]   CARDIOVERSION N/A 01/31/2024   Procedure: CARDIOVERSION;  Surgeon: Sheryle Donning, MD;  [facility];  Service: Cardiovascular;  Laterality: N/A;   CHOLECYSTECTOMY  1983   COLONOSCOPY WITH PROPOFOL  N/A 07/13/2015   Procedure: COLONOSCOPY WITH PROPOFOL ;  Surgeon: Garrett Kallman, MD;  Location: [facility];  Service: Endoscopy;  Laterality: N/A;   ESOPHAGECTOMY  08-21-2012   [facility]   w/ gastric pull-through   ESOPHAGOGASTRODUODENOSCOPY (EGD) WITH ESOPHAGEAL DILATION  multiple x32 per pt approx.--- last one 02-09-2015   recurrent anastomotic stricture post esophagectomy   HIATAL HERNIA REPAIR  06/26/2012   Procedure: LAPAROSCOPIC REPAIR OF HIATAL HERNIA;  Surgeon: Evander Hills, DO;  Location: [facility] ORS;  Service:  General;;   HUMERUS IM NAIL Right 12/28/2023   Procedure: INSERTION, INTRAMEDULLARY ROD, HUMERUS;  Surgeon: Sammye Cristal, MD;  Location: [facility];  Service: Orthopedics;  Laterality: Right;   INCISIONAL HERNIA REPAIR  06/26/2012   Procedure: HERNIA REPAIR INCISIONAL;  Surgeon: Evander Hills, DO;  Location: [facility];  Service: General;;   INGUINAL HERNIA REPAIR Bilateral 1982   JEJUNOSTOMY FEEDING TUBE  12/22/2012   Removed 12/ 2016  approx.   KNEE ARTHROSCOPY Left 1990   KNEE ARTHROSCOPY Right 12/2019   LAPAROSCOPIC GASTRIC SLEEVE RESECTION  06/2012   started but abandoned d/t discovery of tumor   LAPAROTOMY  07/12/2021   revision of previous gastric conduit, hiatal hernia repair   MASS EXCISION Left 04/04/2013   Procedure: EXCISION OF SCALP MASS;  Surgeon: Evander Hills, DO;  Location: [facility];  Service: General;  Laterality: Left;   pilomatrixoma   NESBIT PROCEDURE N/A 12/09/2016   Procedure: NESBIT PROCEDURE 16 DOT PLICATION;   Surgeon: Mark Ottelin, MD;  Location[facility];  Service: Urology;  Laterality: N/A;   THORACOTOMY  07/12/2021   revision of previous gastric conduit   TONSILLECTOMY  1959   TRANSTHORACIC ECHOCARDIOGRAM  08-06-2015   [facility]   mild LVH, ef 50%/  mild RAE /  moderate LAE/  trivial AR, MR, PR,  and TR    ALLERGIES: Allergies  Allergen Reactions   Chlorthalidone Other (See Comments)    urinary urgency   Lisinopril  Other (See Comments)    cough   Metformin  Hcl Other (See Comments)    fecal urgency    FAMILY HISTORY:  Family History  Problem Relation Age of Onset   Hypertension Mother    Heart failure Mother    Prostate cancer Father    Kidney cancer Father    Emphysema Father    Aortic aneurysm Father    Anorexia nervosa Sister    Depression Sister    Anorexia nervosa Sister    Heart disease Sister    Hypertension Brother    Cancer Maternal Grandmother    Heart attack Maternal Grandfather    Heart disease Maternal Grandfather    Cancer Paternal Grandmother    Alcohol abuse Paternal Grandfather    Early death Paternal Grandfather    Tuberculosis Paternal Grandfather    Colon cancer Neg Hx     SOCIAL HISTORY: Social History   Socioeconomic History   Marital status: Married    Spouse name: Not on file   Number of children: 4   Years of education: Not on file   Highest education level: Bachelor's degree (e.g., BA, AB, BS)  Occupational History   Occupation: retired, Medical illustrator, Designer, fashion/clothing  Tobacco Use   Smoking status: Never    Passive exposure: Past   Smokeless tobacco: Never   Tobacco comments:    Never smoked 01/26/24  Vaping Use   Vaping status: Never Used  Substance and Sexual Activity   Alcohol use: Not Currently    Comment: socially   Drug use: Never   Sexual activity: Not Currently  Other Topics Concern   Not on file  Social History Narrative   Household- lives w/ wife   Social Drivers of Health   Financial Resource Strain: Low Risk   (11/06/2023)   Overall Financial Resource Strain (CARDIA)    Difficulty of Paying Living Expenses: Not very hard  Food Insecurity: Patient Declined (12/28/2023)   Hunger Vital Sign    Worried About Running Out of Food in the Last Year:  Patient declined    Barista in the Last Year: Patient declined  Transportation Needs: No Transportation Needs (12/28/2023)   PRAPARE - Administrator, Civil Service (Medical): No    Lack of Transportation (Non-Medical): No  Physical Activity: Inactive (11/06/2023)   Exercise Vital Sign    Days of Exercise per Week: 0 days    Minutes of Exercise per Session: 30 min  Stress: Stress Concern Present (11/06/2023)   [employer] of Occupational Health - Occupational Stress Questionnaire    Feeling of Stress : Very much  Social Connections: Unknown (12/28/2023)   Social Connection and Isolation Panel [NHANES]    Frequency of Communication with Friends and Family: Once a week    Frequency of Social Gatherings with Friends and Family: Once a week    Attends Religious Services: Patient declined    Database administrator or Organizations: No    Attends Banker Meetings: Never    Marital Status: Married    MEDICATIONS:  Current Outpatient Medications  Medication Sig Dispense Refill   acetaminophen  (TYLENOL ) 500 MG tablet Take 1,000 mg by mouth 2 (two) times daily.     Alcohol Swabs ([employer] PREP) 70 % PADS USE AS DIRECTED 200 each 2   apixaban  (ELIQUIS ) 5 MG TABS tablet Take 1 tablet (5 mg total) by mouth 2 (two) times daily. 180 tablet 1   budesonide -formoterol  (SYMBICORT ) 160-4.5 MCG/ACT inhaler Inhale 2 puffs into the lungs 2 (two) times daily. (Patient taking differently: Inhale 2 puffs into the lungs 2 (two) times daily as needed (Cough).) 1 each 3   cyanocobalamin  (VITAMIN B12) 1000 MCG tablet Take 1,000 mcg by mouth daily.     ferrous sulfate 325 (65 FE) MG tablet Take 325 mg by mouth daily.     fludrocortisone  (FLORINEF ) 0.1 MG  tablet Take 1 tablet (0.1 mg total) by mouth daily. 90 tablet 3   FLUoxetine  (PROZAC ) 20 MG tablet Take 3 tablets (60 mg total) by mouth daily. 270 tablet 1   hydrOXYzine  (ATARAX ) 25 MG tablet Take 1 tablet (25 mg total) by mouth 2 (two) times daily as needed for anxiety (Insomnia). 40 tablet 1   Insulin  Syringe-Needle U-100 (BD VEO INSULIN  SYRINGE U/F) 31G X 15/64" 0.3 ML MISC Use as directed in the morning, at noon, in the evening, and at bedtime. 300 each 3   metoprolol  tartrate (LOPRESSOR ) 25 MG tablet Take 25 mg by mouth 2 (two) times daily.     Multiple Vitamins-Minerals (PRESERVISION AREDS PO) Take 1 tablet by mouth in the morning and at bedtime. Preservision AREDS     oxyCODONE  (OXY IR/ROXICODONE ) 5 MG immediate release tablet Take 1-2 tablets (5-10 mg total) by mouth every 4 (four) hours as needed for moderate pain (pain score 4-6) (pain score 4-6). 30 tablet 0   rosuvastatin  (CRESTOR ) 40 MG tablet Take 40 mg by mouth daily.     tiZANidine  (ZANAFLEX ) 4 MG tablet Take 1 tablet (4 mg total) by mouth every 8 (eight) hours as needed. (Patient taking differently: Take 4 mg by mouth daily.) 30 tablet 0   albuterol  (VENTOLIN  HFA) 108 (90 Base) MCG/ACT inhaler Inhale 2 puffs into the lungs every 6 (six) hours as needed for wheezing or shortness of breath. (Patient not taking: Reported on 01/30/2024) 8 g 2   HUMULIN  70/30 (70-30) 100 UNIT/ML injection 22 UNITS IN THE MORNING WITH BREAKFAST AND 6 UNITS IN THE EVENING WITH SUPPER. 10 mL 4   No  current facility-administered medications for this visit.    PHYSICAL EXAM: Vitals:   02/07/24 0829  BP: 130/80  Pulse: 76  Resp: 20  SpO2: 96%  Weight: 225 lb 3.2 oz (102.2 kg)  Height: 6\' 1"  (1.854 m)    Body mass index is 29.71 kg/m.  Wt Readings from Last 3 Encounters:  02/07/24 225 lb 3.2 oz (102.2 kg)  01/31/24 226 lb (102.5 kg)  01/26/24 227 lb 3.2 oz (103.1 kg)    General: Well developed, well nourished male in no apparent distress.   HEENT: AT/[place], no external lesions.  Eyes: Conjunctiva clear and no icterus. Neck: Neck supple  Lungs: Respirations not labored Neurologic: Alert, oriented, normal speech Extremities / Skin: Dry.  Psychiatric: Does not appear depressed or anxious  Diabetic Foot Exam - Simple   No data filed    LABS Reviewed Lab Results  Component Value Date   HGBA1C 6.9 (A) 02/07/2024   HGBA1C 6.8 (A) 11/07/2023   HGBA1C 6.8 (A) 08/08/2023   No results found for: "FRUCTOSAMINE" Lab Results  Component Value Date   CHOL 118 01/05/2023   HDL 42 01/05/2023   LDLCALC 44 01/05/2023   TRIG 197 (H) 01/05/2023   CHOLHDL 2.8 01/05/2023   Lab Results  Component Value Date   MICRALBCREAT 12.2 04/07/2023   Lab Results  Component Value Date   CREATININE 1.20 01/01/2024   No results found for: "GFR"  ASSESSMENT / PLAN  1. Controlled type 2 diabetes mellitus with diabetic polyneuropathy, with long-term current use of insulin  (HCC)     Diabetes Mellitus type 2, complicated by diabetic retinopathy and neuropathy. - Diabetic status / severity: Fair control.  Lab Results  Component Value Date   HGBA1C 6.9 (A) 02/07/2024    - Hemoglobin A1c goal : <7%  He has random postprandial hyperglycemia and occasional mild hypoglycemia in the early morning.  He has been taking insulin  after eating.  Hemoglobin A1c today 6.9%.  - Medications: No change.  I) Adjust  Humulin  70/30 :  20 units to 22 units in the morning with breakfast and  8 units to 6 units in the evening with supper.    - will likely avoid GLP-1 receptor agonist, history of gastric/esophageal surgery and upper GI symptoms.  Patient is asked to take insulin  15 to 30 minutes before eating.  - Home glucose testing: CGM/freestyle libre and check as needed. - Discussed/ Gave Hypoglycemia treatment plan.  # Consult : not required at this time.   # Annual urine for microalbuminuria/ creatinine ratio, no microalbuminuria  currently.  Last  Lab Results  Component Value Date   MICRALBCREAT 12.2 04/07/2023    # Foot check nightly / neuropathy.  Advised to follow-up with podiatry.  # He has diabetic retinopathy, following with ophthalmology every 6 months.  - Diet: Eat reasonable portion sizes to promote a healthy weight - Life style / activity / exercise: Discussed.  2. Blood pressure  -  BP Readings from Last 1 Encounters:  02/07/24 130/80    - Control is in target.  - No change in current plans.  3. Lipid status / Hyperlipidemia - Last  Lab Results  Component Value Date   LDLCALC 44 01/05/2023   - Continue rosuvastatin  40 mg daily.   Diagnoses and all orders for this visit:  Controlled type 2 diabetes mellitus with diabetic polyneuropathy, with long-term current use of insulin  (HCC) -     POCT glycosylated hemoglobin (Hb A1C) -  HUMULIN  70/30 (70-30) 100 UNIT/ML injection; 22 UNITS IN THE MORNING WITH BREAKFAST AND 6 UNITS IN THE EVENING WITH SUPPER.   DISPOSITION Follow up in clinic in 3  months suggested.  Labs on the same day of the visit.   All questions answered and patient verbalized understanding of the plan.  Lawrence Ezriel Boffa, MD [facility] [address] [place], [address] [place], [place] [address] Phone # [phone]  At least part of this note was generated using voice recognition software. Inadvertent word errors may have occurred, which were not recognized during the proofreading process.

## 2024-02-07 NOTE — Patient Instructions (Signed)
 Humulin  70/30 :  22 units in the morning with breakfast and  6 units in the evening with supper.

## 2024-02-15 ENCOUNTER — Ambulatory Visit (HOSPITAL_COMMUNITY): Admitting: Physician Assistant

## 2024-02-16 DIAGNOSIS — R531 Weakness: Secondary | ICD-10-CM | POA: Diagnosis not present

## 2024-02-16 DIAGNOSIS — M25621 Stiffness of right elbow, not elsewhere classified: Secondary | ICD-10-CM | POA: Diagnosis not present

## 2024-02-16 DIAGNOSIS — M25611 Stiffness of right shoulder, not elsewhere classified: Secondary | ICD-10-CM | POA: Diagnosis not present

## 2024-02-16 DIAGNOSIS — M25631 Stiffness of right wrist, not elsewhere classified: Secondary | ICD-10-CM | POA: Diagnosis not present

## 2024-02-19 ENCOUNTER — Ambulatory Visit (HOSPITAL_COMMUNITY)
Admission: RE | Admit: 2024-02-19 | Discharge: 2024-02-19 | Disposition: A | Discharge status: Home / Self Care | Source: Ambulatory Visit | Attending: Internal Medicine | Admitting: Internal Medicine

## 2024-02-19 ENCOUNTER — Encounter (HOSPITAL_COMMUNITY): Payer: Self-pay | Admitting: Physician Assistant

## 2024-02-19 ENCOUNTER — Ambulatory Visit (HOSPITAL_COMMUNITY): Admitting: Physician Assistant

## 2024-02-19 VITALS — BP 114/82 | HR 75 | Ht 73.0 in | Wt 222.0 lb

## 2024-02-19 DIAGNOSIS — E113293 Type 2 diabetes mellitus with mild nonproliferative diabetic retinopathy without macular edema, bilateral: Secondary | ICD-10-CM | POA: Diagnosis not present

## 2024-02-19 DIAGNOSIS — H35033 Hypertensive retinopathy, bilateral: Secondary | ICD-10-CM | POA: Diagnosis not present

## 2024-02-19 DIAGNOSIS — I480 Paroxysmal atrial fibrillation: Secondary | ICD-10-CM | POA: Diagnosis not present

## 2024-02-19 DIAGNOSIS — D6869 Other thrombophilia: Secondary | ICD-10-CM | POA: Diagnosis not present

## 2024-02-19 DIAGNOSIS — H2513 Age-related nuclear cataract, bilateral: Secondary | ICD-10-CM | POA: Diagnosis not present

## 2024-02-19 DIAGNOSIS — H353132 Nonexudative age-related macular degeneration, bilateral, intermediate dry stage: Secondary | ICD-10-CM | POA: Diagnosis not present

## 2024-02-19 DIAGNOSIS — I4819 Other persistent atrial fibrillation: Secondary | ICD-10-CM | POA: Diagnosis not present

## 2024-02-19 DIAGNOSIS — H43823 Vitreomacular adhesion, bilateral: Secondary | ICD-10-CM | POA: Diagnosis not present

## 2024-02-19 NOTE — Progress Notes (Incomplete)
 Primary Care Physician: Ezell Hollow, MD Primary Cardiologist: Alexandria Angel, MD Electrophysiologist: Will Cortland Ding, MD  Referring Physician: Dr Lorriane Rote. is a 73 y.o. male with a history of CAD, symptomatic bradycardia s/p PPM, TIA, HTN, orthostatic hypotension, HLD, DM, atrial fibrillation who presents for follow up in the [facility].  He had a pacemaker inserted in 2015. He had a A-fib ablation in 2017 at [facility]. Coronary CT in 2024 showed an elevated calcium  score of 1025 with FFR showing no obstructive disease. Echo at the time showed ejection fraction of 55 to 60%. On 12/28/2023 patient underwent intramedullary nail to the right humerus following a proximal humerus shaft fracture.     His device has shown persistent afib since 08/2023. Patient is on Eliquis  for stroke prevention.    Patient returns for follow up for atrial fibrillation. He is s/p DCCV on 01/31/24. ***   Today, he  denies symptoms of ***palpitations, chest pain, shortness of breath, orthopnea, PND, lower extremity edema, dizziness, presyncope, syncope, snoring, daytime somnolence, bleeding, or neurologic sequela. The patient is tolerating medications without difficulties and is otherwise without complaint today.    Atrial Fibrillation Risk Factors:  he does not have symptoms or diagnosis of sleep apnea. he does not have a history of rheumatic fever. he does have a history of alcohol use.   Atrial Fibrillation Management history:  Previous antiarrhythmic drugs: none Previous cardioversions: remotely, 01/31/24 Previous ablations: 2017 at [facility] Anticoagulation history: Eliquis   ROS- All systems are reviewed and negative except as per the HPI above.  Past Medical History:  Diagnosis Date   Anxiety 01/20/2023   Bradycardia    Cancer (HCC) 2013   esophageal cancer   Depression    Dysrhythmia    Afib   GERD (gastroesophageal reflux disease)    H/O hiatal  hernia    s/p  repair 06-26-2012   History of esophagectomy 08-21-2012  AT [facility]   History of kidney stones 2006   History of malignant neoplasm of esophagus ONCOLOGIST-  DR D'AMICO AT [facility]-- PER LAST NOTE 01/ 2018  NO RECURRENCE   dx 10/ 2013  Stage 2A (T2 N0)  08-21-2012 s/p  esophagectomy w/ gastric pull-through [facility]) post residual recurrent anastomotic stricture (multiple EGD w/ dilations)   History of transient ischemic attack (TIA) 2003   no residual   Hypertension    Long term (current) use of anticoagulants    xarelto    Neuromuscular disorder (HCC)    neuropathy of feet   On dofetilide  therapy    PAF (paroxysmal atrial fibrillation) (HCC) first dx 2009   primary cardiologist-  dr Kay Parson /  EP cardiologist -- dr Arvell Birchwood ([facility])  s/p  afib ablation 08-02-2016   Pancreatic cyst    x2    Peyronie's disease    Presence of permanent cardiac pacemaker implanted 05-01-2014  at [facility]   Dr.Daubert -[facility] follows   Pulmonary nodule, right    upper and middle lobes --  per last CT stable   Type 2 diabetes mellitus treated with insulin  (HCC)    Wears glasses     Current Outpatient Medications  Medication Sig Dispense Refill   acetaminophen  (TYLENOL ) 500 MG tablet Take 1,000 mg by mouth 2 (two) times daily.     albuterol  (VENTOLIN  HFA) 108 (90 Base) MCG/ACT inhaler Inhale 2 puffs into the lungs every 6 (six) hours as needed for wheezing or shortness of breath. (  Patient not taking: Reported on 01/30/2024) 8 g 2   Alcohol Swabs ([employer] PREP) 70 % PADS USE AS DIRECTED 200 each 2   apixaban  (ELIQUIS ) 5 MG TABS tablet Take 1 tablet (5 mg total) by mouth 2 (two) times daily. 180 tablet 1   budesonide -formoterol  (SYMBICORT ) 160-4.5 MCG/ACT inhaler Inhale 2 puffs into the lungs 2 (two) times daily. (Patient taking differently: Inhale 2 puffs into the lungs 2 (two) times daily as needed (Cough).) 1 each 3   cyanocobalamin  (VITAMIN B12) 1000 MCG tablet Take 1,000 mcg by mouth daily.      ferrous sulfate 325 (65 FE) MG tablet Take 325 mg by mouth daily.     fludrocortisone  (FLORINEF ) 0.1 MG tablet Take 1 tablet (0.1 mg total) by mouth daily. 90 tablet 3   FLUoxetine  (PROZAC ) 20 MG tablet Take 3 tablets (60 mg total) by mouth daily. 270 tablet 1   HUMULIN  70/30 (70-30) 100 UNIT/ML injection 22 UNITS IN THE MORNING WITH BREAKFAST AND 6 UNITS IN THE EVENING WITH SUPPER. 10 mL 4   hydrOXYzine  (ATARAX ) 25 MG tablet Take 1 tablet (25 mg total) by mouth 2 (two) times daily as needed for anxiety (Insomnia). 40 tablet 1   Insulin  Syringe-Needle U-100 (BD VEO INSULIN  SYRINGE U/F) 31G X 15/64 0.3 ML MISC Use as directed in the morning, at noon, in the evening, and at bedtime. 300 each 3   metoprolol  tartrate (LOPRESSOR ) 25 MG tablet Take 25 mg by mouth 2 (two) times daily.     Multiple Vitamins-Minerals (PRESERVISION AREDS PO) Take 1 tablet by mouth in the morning and at bedtime. Preservision AREDS     oxyCODONE  (OXY IR/ROXICODONE ) 5 MG immediate release tablet Take 1-2 tablets (5-10 mg total) by mouth every 4 (four) hours as needed for moderate pain (pain score 4-6) (pain score 4-6). 30 tablet 0   rosuvastatin  (CRESTOR ) 40 MG tablet Take 40 mg by mouth daily.     tiZANidine  (ZANAFLEX ) 4 MG tablet Take 1 tablet (4 mg total) by mouth every 8 (eight) hours as needed. (Patient taking differently: Take 4 mg by mouth daily.) 30 tablet 0   No current facility-administered medications for this visit.    Physical Exam: There were no vitals taken for this visit.  GEN: Well nourished, well developed in no acute distress NECK: No JVD; No carotid bruits CARDIAC: {EPRHYTHM:[id number]}, no murmurs, rubs, gallops RESPIRATORY:  Clear to auscultation without rales, wheezing or rhonchi  ABDOMEN: Soft, non-tender, non-distended EXTREMITIES:  No edema; No deformity    Wt Readings from Last 3 Encounters:  02/07/24 102.2 kg  01/31/24 102.5 kg  01/26/24 103.1 kg     EKG today demonstrates   ***   Echo 01/18/23 demonstrated   1. Left ventricular ejection fraction, by estimation, is 55 to 60%. The  left ventricle has normal function. The left ventricle has no regional  wall motion abnormalities. There is mild left ventricular hypertrophy.  Left ventricular diastolic parameters are consistent with Grade II diastolic dysfunction (pseudonormalization). The average left ventricular global longitudinal strain is -11.3 %. The global longitudinal strain is abnormal.   2. Right ventricular systolic function is normal. The right ventricular  size is normal. There is normal pulmonary artery systolic pressure.   3. The mitral valve is normal in structure. No evidence of mitral valve  regurgitation. No evidence of mitral stenosis.   4. The aortic valve is tricuspid. There is mild calcification of the  aortic valve. There is mild thickening of the  aortic valve. Aortic valve  regurgitation is not visualized. Mild aortic valve stenosis. Aortic valve  mean gradient measures 9.0 mmHg.   5. The inferior vena cava is normal in size with greater than 50%  respiratory variability, suggesting right atrial pressure of 3 mmHg.    CHA2DS2-VASc Score = 6  The patient's score is based upon: CHF History: 0 HTN History: 1 Diabetes History: 1 Stroke History: 2 Vascular Disease History: 1 Age Score: 1 Gender Score: 0   {Confirm score is correct.  If not, click here to update score.  REFRESH note.  :1}    ASSESSMENT AND PLAN: Persistent Atrial Fibrillation (ICD10:  I48.19) The patient's CHA2DS2-VASc score is 6, indicating a 9.7% annual risk of stroke.   S/p afib ablation at [facility] 2017 S/p DCCV 01/31/24 ***amio bridge  He is scheduled for repeat ablation 05/01/24 Continue Eliquis  5 mg BID Continue Lopressor  25 mg BID  Secondary Hypercoagulable State (ICD10:  D68.69){Click to add to Prob List or Visit Dx  :[id number]} The patient is at significant risk for stroke/thromboembolism based upon his  CHA2DS2-VASc Score of 6.  Continue Apixaban  (Eliquis ). Patient interested in [place] post ablation given his recent falls.   Symptomatic bradycardia S/p PPM, followed by Dr Lawana Pray ***  CAD No anginal symptoms Followed by Dr Audery Blazing ***    Follow up ***    Myrtha Ates PA-C Afib Clinic [facility] [address] [place], [place] [address] [phone]

## 2024-02-19 NOTE — Progress Notes (Signed)
 Primary Care Physician: Ezell Hollow, MD Primary Cardiologist: Alexandria Angel, MD Electrophysiologist: Will Cortland Ding, MD  Referring Physician: Dr Lorriane Rote. is a 73 y.o. male with a history of CAD, symptomatic bradycardia s/p PPM, TIA, HTN, orthostatic hypotension, HLD, DM, atrial fibrillation who presents for follow up in the [facility].  He had a pacemaker inserted in 2015. He had a A-fib ablation in 2017 at [facility]. Coronary CT in 2024 showed an elevated calcium  score of 1025 with FFR showing no obstructive disease. Echo at the time showed ejection fraction of 55 to 60%. On 12/28/2023 patient underwent intramedullary nail to the right humerus following a proximal humerus shaft fracture.     His device has shown persistent afib since 08/2023. Patient is on Eliquis  for stroke prevention.    Patient returns for follow up for atrial fibrillation. He is s/p DCCV on 01/31/24. He remains in A paced rhythm today. He states that his intermittent dizziness has improved since DCCV. He is scheduled for repeat ablation in August. No bleeding issues on anticoagulation.    Today, he  denies symptoms of palpitations, chest pain, shortness of breath, orthopnea, PND, lower extremity edema, dizziness, presyncope, syncope, snoring, daytime somnolence, bleeding, or neurologic sequela. The patient is tolerating medications without difficulties and is otherwise without complaint today.    Atrial Fibrillation Risk Factors:  he does not have symptoms or diagnosis of sleep apnea. he does not have a history of rheumatic fever. he does have a history of alcohol use.   Atrial Fibrillation Management history:  Previous antiarrhythmic drugs: none Previous cardioversions: remotely, 01/31/24 Previous ablations: 2017 at [facility] Anticoagulation history: Eliquis   ROS- All systems are reviewed and negative except as per the HPI above.  Past Medical History:  Diagnosis  Date   Anxiety 01/20/2023   Bradycardia    Cancer (HCC) 2013   esophageal cancer   Depression    Dysrhythmia    Afib   GERD (gastroesophageal reflux disease)    H/O hiatal hernia    s/p  repair 06-26-2012   History of esophagectomy 08-21-2012  AT [facility]   History of kidney stones 2006   History of malignant neoplasm of esophagus ONCOLOGIST-  DR D'AMICO AT [facility]-- PER LAST NOTE 01/ 2018  NO RECURRENCE   dx 10/ 2013  Stage 2A (T2 N0)  08-21-2012 s/p  esophagectomy w/ gastric pull-through [facility]) post residual recurrent anastomotic stricture (multiple EGD w/ dilations)   History of transient ischemic attack (TIA) 2003   no residual   Hypertension    Long term (current) use of anticoagulants    xarelto    Neuromuscular disorder (HCC)    neuropathy of feet   On dofetilide  therapy    PAF (paroxysmal atrial fibrillation) (HCC) first dx 2009   primary cardiologist-  dr Kay Parson /  EP cardiologist -- dr Arvell Birchwood ([facility])  s/p  afib ablation 08-02-2016   Pancreatic cyst    x2    Peyronie's disease    Presence of permanent cardiac pacemaker implanted 05-01-2014  at [facility]   Dr.Daubert -[facility] follows   Pulmonary nodule, right    upper and middle lobes --  per last CT stable   Type 2 diabetes mellitus treated with insulin  (HCC)    Wears glasses     Current Outpatient Medications  Medication Sig Dispense Refill   acetaminophen  (TYLENOL ) 500 MG tablet Take 1,000 mg by mouth 2 (two) times daily.  albuterol  (VENTOLIN  HFA) 108 (90 Base) MCG/ACT inhaler Inhale 2 puffs into the lungs every 6 (six) hours as needed for wheezing or shortness of breath. (Patient not taking: Reported on 01/30/2024) 8 g 2   Alcohol Swabs (CVS PREP) 70 % PADS USE AS DIRECTED 200 each 2   apixaban  (ELIQUIS ) 5 MG TABS tablet Take 1 tablet (5 mg total) by mouth 2 (two) times daily. 180 tablet 1   budesonide -formoterol  (SYMBICORT ) 160-4.5 MCG/ACT inhaler Inhale 2 puffs into the lungs 2 (two) times daily.  (Patient taking differently: Inhale 2 puffs into the lungs 2 (two) times daily as needed (Cough).) 1 each 3   cyanocobalamin  (VITAMIN B12) 1000 MCG tablet Take 1,000 mcg by mouth daily.     ferrous sulfate 325 (65 FE) MG tablet Take 325 mg by mouth daily.     fludrocortisone  (FLORINEF ) 0.1 MG tablet Take 1 tablet (0.1 mg total) by mouth daily. 90 tablet 3   FLUoxetine  (PROZAC ) 20 MG tablet Take 3 tablets (60 mg total) by mouth daily. 270 tablet 1   HUMULIN  70/30 (70-30) 100 UNIT/ML injection 22 UNITS IN THE MORNING WITH BREAKFAST AND 6 UNITS IN THE EVENING WITH SUPPER. 10 mL 4   hydrOXYzine  (ATARAX ) 25 MG tablet Take 1 tablet (25 mg total) by mouth 2 (two) times daily as needed for anxiety (Insomnia). 40 tablet 1   Insulin  Syringe-Needle U-100 (BD VEO INSULIN  SYRINGE U/F) 31G X 15/64 0.3 ML MISC Use as directed in the morning, at noon, in the evening, and at bedtime. 300 each 3   metoprolol  tartrate (LOPRESSOR ) 25 MG tablet Take 25 mg by mouth 2 (two) times daily.     Multiple Vitamins-Minerals (PRESERVISION AREDS PO) Take 1 tablet by mouth in the morning and at bedtime. Preservision AREDS     oxyCODONE  (OXY IR/ROXICODONE ) 5 MG immediate release tablet Take 1-2 tablets (5-10 mg total) by mouth every 4 (four) hours as needed for moderate pain (pain score 4-6) (pain score 4-6). 30 tablet 0   rosuvastatin  (CRESTOR ) 40 MG tablet Take 40 mg by mouth daily.     tiZANidine  (ZANAFLEX ) 4 MG tablet Take 1 tablet (4 mg total) by mouth every 8 (eight) hours as needed. (Patient taking differently: Take 4 mg by mouth daily.) 30 tablet 0   No current facility-administered medications for this encounter.    Physical Exam: There were no vitals taken for this visit.  GEN: Well nourished, well developed in no acute distress CARDIAC: Regular rate and rhythm, no murmurs, rubs, gallops RESPIRATORY:  Clear to auscultation without rales, wheezing or rhonchi  ABDOMEN: Soft, non-tender, non-distended EXTREMITIES:  No  edema; No deformity    Wt Readings from Last 3 Encounters:  02/07/24 102.2 kg  01/31/24 102.5 kg  01/26/24 103.1 kg     EKG today demonstrates  A paced rhythm, NST Vent. rate 75 BPM PR interval 188 ms QRS duration 92 ms QT/QTcB 410/457 ms   Echo 01/18/23 demonstrated   1. Left ventricular ejection fraction, by estimation, is 55 to 60%. The  left ventricle has normal function. The left ventricle has no regional  wall motion abnormalities. There is mild left ventricular hypertrophy.  Left ventricular diastolic parameters are consistent with Grade II diastolic dysfunction (pseudonormalization). The average left ventricular global longitudinal strain is -11.3 %. The global longitudinal strain is abnormal.   2. Right ventricular systolic function is normal. The right ventricular  size is normal. There is normal pulmonary artery systolic pressure.   3. The  mitral valve is normal in structure. No evidence of mitral valve  regurgitation. No evidence of mitral stenosis.   4. The aortic valve is tricuspid. There is mild calcification of the  aortic valve. There is mild thickening of the aortic valve. Aortic valve  regurgitation is not visualized. Mild aortic valve stenosis. Aortic valve  mean gradient measures 9.0 mmHg.   5. The inferior vena cava is normal in size with greater than 50%  respiratory variability, suggesting right atrial pressure of 3 mmHg.    CHA2DS2-VASc Score = 6  The patient's score is based upon: CHF History: 0 HTN History: 1 Diabetes History: 1 Stroke History: 2 Vascular Disease History: 1 Age Score: 1 Gender Score: 0       ASSESSMENT AND PLAN: Persistent Atrial Fibrillation (ICD10:  I48.19) The patient's CHA2DS2-VASc score is 6, indicating a 9.7% annual risk of stroke.   S/p afib ablation at [facility] 2017 S/p DCCV 01/31/24 He is in an A paced rhythm today.  He is scheduled for repeat ablation 05/01/24. Could consider amiodarone bridge if his has quick return  of afib.  Continue Eliquis  5 mg BID Continue Lopressor  25 mg BID  Secondary Hypercoagulable State (ICD10:  D68.69) The patient is at significant risk for stroke/thromboembolism based upon his CHA2DS2-VASc Score of 6.  Continue Apixaban  (Eliquis ). Patient interested in [place] post ablation given his recent falls. Will revisit at his follow up after ablation.   Symptomatic bradycardia S/p PPM, followed by Dr Lawana Pray  CAD No anginal symptoms Followed by Dr Audery Blazing    Follow up with Dr Audery Blazing as scheduled and then with Dr Lawana Pray for ablation.     Myrtha Ates PA-C Afib Clinic [facility] [address] [place], [place] [address] [phone]

## 2024-02-20 DIAGNOSIS — M25611 Stiffness of right shoulder, not elsewhere classified: Secondary | ICD-10-CM | POA: Diagnosis not present

## 2024-02-20 DIAGNOSIS — R531 Weakness: Secondary | ICD-10-CM | POA: Diagnosis not present

## 2024-02-20 DIAGNOSIS — M25621 Stiffness of right elbow, not elsewhere classified: Secondary | ICD-10-CM | POA: Diagnosis not present

## 2024-02-20 DIAGNOSIS — M25631 Stiffness of right wrist, not elsewhere classified: Secondary | ICD-10-CM | POA: Diagnosis not present

## 2024-02-22 DIAGNOSIS — M25631 Stiffness of right wrist, not elsewhere classified: Secondary | ICD-10-CM | POA: Diagnosis not present

## 2024-02-22 DIAGNOSIS — M25611 Stiffness of right shoulder, not elsewhere classified: Secondary | ICD-10-CM | POA: Diagnosis not present

## 2024-02-22 DIAGNOSIS — R531 Weakness: Secondary | ICD-10-CM | POA: Diagnosis not present

## 2024-02-22 DIAGNOSIS — M25621 Stiffness of right elbow, not elsewhere classified: Secondary | ICD-10-CM | POA: Diagnosis not present

## 2024-02-23 ENCOUNTER — Other Ambulatory Visit (HOSPITAL_BASED_OUTPATIENT_CLINIC_OR_DEPARTMENT_OTHER): Payer: Self-pay

## 2024-02-26 ENCOUNTER — Other Ambulatory Visit (HOSPITAL_BASED_OUTPATIENT_CLINIC_OR_DEPARTMENT_OTHER): Payer: Self-pay

## 2024-02-26 ENCOUNTER — Encounter: Payer: Self-pay | Admitting: Pharmacist

## 2024-02-26 NOTE — Progress Notes (Signed)
 Pharmacy Quality Measure Review  This patient is appearing on a report for being at risk of failing the adherence measure for cholesterol (statin) medications this calendar year.   Medication: rosuvastatin   Last fill date: 09/21/2023 for 90 day supply  Left voicemail for patient to return my call at their convenience.  Also sent MyChart message sent to patient. Will need updated Rx sent to pharmacy but will wait for patient to confirm he is still taking rosuvastatin .   Madelin Ray, PharmD Clinical Pharmacist [facility]

## 2024-02-27 DIAGNOSIS — E1165 Type 2 diabetes mellitus with hyperglycemia: Secondary | ICD-10-CM | POA: Diagnosis not present

## 2024-02-28 ENCOUNTER — Other Ambulatory Visit: Payer: Self-pay | Admitting: Pharmacist

## 2024-02-28 DIAGNOSIS — E785 Hyperlipidemia, unspecified: Secondary | ICD-10-CM

## 2024-02-28 MED ORDER — ROSUVASTATIN CALCIUM 40 MG PO TABS: 40.0000 mg | ORAL_TABLET | Freq: Every day | ORAL | 1 refills | Status: DC

## 2024-02-28 NOTE — Telephone Encounter (Signed)
 Patient reports he his still taking rosuvastatin  every day. He is due to get an updated Rx.

## 2024-02-29 ENCOUNTER — Encounter

## 2024-03-01 ENCOUNTER — Other Ambulatory Visit: Payer: Self-pay | Admitting: Cardiology

## 2024-03-01 ENCOUNTER — Telehealth: Payer: Self-pay | Admitting: Cardiology

## 2024-03-01 ENCOUNTER — Encounter: Payer: Self-pay | Admitting: Cardiology

## 2024-03-01 DIAGNOSIS — M25611 Stiffness of right shoulder, not elsewhere classified: Secondary | ICD-10-CM | POA: Diagnosis not present

## 2024-03-01 DIAGNOSIS — R531 Weakness: Secondary | ICD-10-CM | POA: Diagnosis not present

## 2024-03-01 DIAGNOSIS — I10 Essential (primary) hypertension: Secondary | ICD-10-CM

## 2024-03-01 DIAGNOSIS — M25621 Stiffness of right elbow, not elsewhere classified: Secondary | ICD-10-CM | POA: Diagnosis not present

## 2024-03-01 DIAGNOSIS — M25631 Stiffness of right wrist, not elsewhere classified: Secondary | ICD-10-CM | POA: Diagnosis not present

## 2024-03-01 MED ORDER — METOPROLOL TARTRATE 25 MG PO TABS: 25.0000 mg | ORAL_TABLET | Freq: Two times a day (BID) | ORAL | 2 refills | Status: AC

## 2024-03-01 NOTE — Telephone Encounter (Signed)
 Pt's medication was sent to pt's pharmacy as requested. Confirmation received.

## 2024-03-01 NOTE — Telephone Encounter (Signed)
*  STAT* If patient is at the pharmacy, call can be transferred to refill team.   1. Which medications need to be refilled? (please list name of each medication and dose if known)   metoprolol  tartrate (LOPRESSOR ) 25 MG tablet   2. Would you like to learn more about the convenience, safety, & potential cost savings by using the [facility]?   3. Are you open to using the [facility] (Type [facility]. ).  4. Which pharmacy/location (including street and city if local pharmacy) is medication to be sent to?  [id number] - [place], [place] - [address]   5. Do they need a 30 day or 90 day supply?   90 day  Patient stated he has 12 tablets left.

## 2024-03-04 ENCOUNTER — Telehealth: Payer: Self-pay

## 2024-03-04 ENCOUNTER — Other Ambulatory Visit (HOSPITAL_BASED_OUTPATIENT_CLINIC_OR_DEPARTMENT_OTHER): Payer: Self-pay

## 2024-03-04 NOTE — Telephone Encounter (Signed)
 Copied from CRM [id number]. Topic: General - Other >> Mar 04, 2024  2:59 PM Berneda FALCON wrote: Reason for CRM: Kellan is returning phone call to [employer].  Pt states he wanted her to know that he would like her to call him with the specific question on the medication.   Pts callback number is [phone]

## 2024-03-05 DIAGNOSIS — M25611 Stiffness of right shoulder, not elsewhere classified: Secondary | ICD-10-CM | POA: Diagnosis not present

## 2024-03-05 DIAGNOSIS — M25631 Stiffness of right wrist, not elsewhere classified: Secondary | ICD-10-CM | POA: Diagnosis not present

## 2024-03-05 DIAGNOSIS — R531 Weakness: Secondary | ICD-10-CM | POA: Diagnosis not present

## 2024-03-05 DIAGNOSIS — M25621 Stiffness of right elbow, not elsewhere classified: Secondary | ICD-10-CM | POA: Diagnosis not present

## 2024-03-05 NOTE — Telephone Encounter (Signed)
 Spoke with patient today. He just wanted to follow up to let me know he did receive rosuvastatin  and also metoprolol .

## 2024-03-07 ENCOUNTER — Ambulatory Visit (INDEPENDENT_AMBULATORY_CARE_PROVIDER_SITE_OTHER)

## 2024-03-07 DIAGNOSIS — R531 Weakness: Secondary | ICD-10-CM | POA: Diagnosis not present

## 2024-03-07 DIAGNOSIS — M25611 Stiffness of right shoulder, not elsewhere classified: Secondary | ICD-10-CM | POA: Diagnosis not present

## 2024-03-07 DIAGNOSIS — M25621 Stiffness of right elbow, not elsewhere classified: Secondary | ICD-10-CM | POA: Diagnosis not present

## 2024-03-07 DIAGNOSIS — R001 Bradycardia, unspecified: Secondary | ICD-10-CM

## 2024-03-07 DIAGNOSIS — M25631 Stiffness of right wrist, not elsewhere classified: Secondary | ICD-10-CM | POA: Diagnosis not present

## 2024-03-12 DIAGNOSIS — M25611 Stiffness of right shoulder, not elsewhere classified: Secondary | ICD-10-CM | POA: Diagnosis not present

## 2024-03-12 DIAGNOSIS — M25621 Stiffness of right elbow, not elsewhere classified: Secondary | ICD-10-CM | POA: Diagnosis not present

## 2024-03-12 DIAGNOSIS — R531 Weakness: Secondary | ICD-10-CM | POA: Diagnosis not present

## 2024-03-12 DIAGNOSIS — M25631 Stiffness of right wrist, not elsewhere classified: Secondary | ICD-10-CM | POA: Diagnosis not present

## 2024-03-14 DIAGNOSIS — M25621 Stiffness of right elbow, not elsewhere classified: Secondary | ICD-10-CM | POA: Diagnosis not present

## 2024-03-14 DIAGNOSIS — M25631 Stiffness of right wrist, not elsewhere classified: Secondary | ICD-10-CM | POA: Diagnosis not present

## 2024-03-14 DIAGNOSIS — R531 Weakness: Secondary | ICD-10-CM | POA: Diagnosis not present

## 2024-03-14 DIAGNOSIS — M25611 Stiffness of right shoulder, not elsewhere classified: Secondary | ICD-10-CM | POA: Diagnosis not present

## 2024-03-14 LAB — CUP PACEART REMOTE DEVICE CHECK
Battery Remaining Longevity: 6 mo
Battery Voltage: 2.86 V
Brady Statistic AP VP Percent: 0.1 %
Brady Statistic AP VS Percent: 96.93 %
Brady Statistic AS VP Percent: 0 %
Brady Statistic AS VS Percent: 2.97 %
Brady Statistic RA Percent Paced: 96.63 %
Brady Statistic RV Percent Paced: 0.12 %
Date Time Interrogation Session: 20250709083619
Implantable Lead Connection Status: 753985
Implantable Lead Connection Status: 753985
Implantable Lead Implant Date: 20150827
Implantable Lead Implant Date: 20150827
Implantable Lead Location: 753859
Implantable Lead Location: 753860
Implantable Lead Model: 5076
Implantable Lead Model: 5076
Implantable Pulse Generator Implant Date: 20150827
Lead Channel Impedance Value: 323 Ohm
Lead Channel Impedance Value: 342 Ohm
Lead Channel Impedance Value: 342 Ohm
Lead Channel Impedance Value: 399 Ohm
Lead Channel Pacing Threshold Amplitude: 0.625 V
Lead Channel Pacing Threshold Amplitude: 1 V
Lead Channel Pacing Threshold Pulse Width: 0.4 ms
Lead Channel Pacing Threshold Pulse Width: 0.4 ms
Lead Channel Sensing Intrinsic Amplitude: 13.625 mV
Lead Channel Sensing Intrinsic Amplitude: 13.625 mV
Lead Channel Sensing Intrinsic Amplitude: 3.125 mV
Lead Channel Sensing Intrinsic Amplitude: 3.125 mV
Lead Channel Setting Pacing Amplitude: 1.5 V
Lead Channel Setting Pacing Amplitude: 2.25 V
Lead Channel Setting Pacing Pulse Width: 0.4 ms
Lead Channel Setting Sensing Sensitivity: 0.9 mV
Zone Setting Status: 755011
Zone Setting Status: 755011

## 2024-03-18 DIAGNOSIS — M25611 Stiffness of right shoulder, not elsewhere classified: Secondary | ICD-10-CM | POA: Diagnosis not present

## 2024-03-18 DIAGNOSIS — M25531 Pain in right wrist: Secondary | ICD-10-CM | POA: Diagnosis not present

## 2024-03-18 DIAGNOSIS — M25621 Stiffness of right elbow, not elsewhere classified: Secondary | ICD-10-CM | POA: Diagnosis not present

## 2024-03-18 DIAGNOSIS — R531 Weakness: Secondary | ICD-10-CM | POA: Diagnosis not present

## 2024-03-18 DIAGNOSIS — M25631 Stiffness of right wrist, not elsewhere classified: Secondary | ICD-10-CM | POA: Diagnosis not present

## 2024-03-19 DIAGNOSIS — M720 Palmar fascial fibromatosis [Dupuytren]: Secondary | ICD-10-CM | POA: Diagnosis not present

## 2024-03-25 NOTE — Progress Notes (Signed)
 HPI: FU atrial fibrillation. Previously followed by Dr Claudene. Also followed at [facility]. Had pacemaker (2015) and atrial fibrillation ablation (2017) at [facility]. Carotid dopplers 2017 showed <50 bilaterally. Recurrent atrial fibrillation requiring DCCV 12/22. Clemens 10/23 (felt to be orthostatic mediated) and had head trauma with small bleed. Apixaban  held.  Coronary CTA May 2024 showed calcium  score 1025 which was 84th percentile, moderate plaque in the LAD and ramus, otherwise mild plaque.  FFR was negative.  Echocardiogram May 2024 showed ejection fraction 55 to 60%, mild left ventricular hypertrophy, grade 2 diastolic dysfunction, mild aortic stenosis with mean gradient 9 mmHg.  Abdominal ultrasound June 2024 showed no abdominal aortic aneurysm.  Since last seen, patient has some dyspnea on exertion unchanged.  No orthopnea, PND, pedal edema, chest pain.  He has had several falls related to orthostasis.  His medications were recently adjusted by primary care including discontinuing anxiolytic and decreasing alcohol use and his symptoms have improved.  Current Outpatient Medications  Medication Sig Dispense Refill   acetaminophen  (TYLENOL ) 500 MG tablet Take 1,000 mg by mouth 2 (two) times daily.     Alcohol Swabs ([employer] PREP) 70 % PADS USE AS DIRECTED 200 each 2   apixaban  (ELIQUIS ) 5 MG TABS tablet Take 1 tablet (5 mg total) by mouth 2 (two) times daily. 180 tablet 1   budesonide -formoterol  (SYMBICORT ) 160-4.5 MCG/ACT inhaler Inhale 2 puffs into the lungs 2 (two) times daily. 1 each 3   cyanocobalamin  (VITAMIN B12) 1000 MCG tablet Take 1,000 mcg by mouth daily.     ferrous sulfate 325 (65 FE) MG tablet Take 325 mg by mouth daily.     fludrocortisone  (FLORINEF ) 0.1 MG tablet Take 1 tablet (0.1 mg total) by mouth daily. 90 tablet 3   FLUoxetine  (PROZAC ) 20 MG tablet Take 3 tablets (60 mg total) by mouth daily. 270 tablet 1   HUMULIN  70/30 (70-30) 100 UNIT/ML injection 22 UNITS IN THE MORNING WITH  BREAKFAST AND 6 UNITS IN THE EVENING WITH SUPPER. 10 mL 4   hydrOXYzine  (ATARAX ) 25 MG tablet Take 1 tablet (25 mg total) by mouth 2 (two) times daily as needed for anxiety (Insomnia). 40 tablet 1   Insulin  Syringe-Needle U-100 (BD VEO INSULIN  SYRINGE U/F) 31G X 15/64 0.3 ML MISC Use as directed in the morning, at noon, in the evening, and at bedtime. 300 each 3   metoprolol  tartrate (LOPRESSOR ) 25 MG tablet Take 1 tablet (25 mg total) by mouth 2 (two) times daily. 180 tablet 2   Multiple Vitamins-Minerals (PRESERVISION AREDS PO) Take 1 tablet by mouth in the morning and at bedtime. Preservision AREDS     rosuvastatin  (CRESTOR ) 40 MG tablet Take 1 tablet (40 mg total) by mouth daily. 90 tablet 1   tiZANidine  (ZANAFLEX ) 4 MG tablet Take 1 tablet (4 mg total) by mouth every 8 (eight) hours as needed. 30 tablet 0   albuterol  (VENTOLIN  HFA) 108 (90 Base) MCG/ACT inhaler Inhale 2 puffs into the lungs every 6 (six) hours as needed for wheezing or shortness of breath. (Patient not taking: Reported on 04/01/2024) 8 g 2   oxyCODONE  (OXY IR/ROXICODONE ) 5 MG immediate release tablet Take 1-2 tablets (5-10 mg total) by mouth every 4 (four) hours as needed for moderate pain (pain score 4-6) (pain score 4-6). (Patient not taking: Reported on 04/01/2024) 30 tablet 0   traMADol  (ULTRAM ) 50 MG tablet Take 50 mg by mouth every 8 (eight) hours as needed. (Patient not taking: Reported on 04/01/2024)  No current facility-administered medications for this visit.     Past Medical History:  Diagnosis Date   Anxiety 01/20/2023   Bradycardia    Cancer (HCC) 2013   esophageal cancer   Depression    Dysrhythmia    Afib   GERD (gastroesophageal reflux disease)    H/O hiatal hernia    s/p  repair 06-26-2012   History of esophagectomy 08-21-2012  AT [facility]   History of kidney stones 2006   History of malignant neoplasm of esophagus ONCOLOGIST-  DR D'AMICO AT [facility]-- PER LAST NOTE 01/ 2018  NO RECURRENCE   dx 10/ 2013   Stage 2A (T2 N0)  08-21-2012 s/p  esophagectomy w/ gastric pull-through [facility]) post residual recurrent anastomotic stricture (multiple EGD w/ dilations)   History of transient ischemic attack (TIA) 2003   no residual   Hypertension    Long term (current) use of anticoagulants    xarelto    Neuromuscular disorder (HCC)    neuropathy of feet   On dofetilide  therapy    PAF (paroxysmal atrial fibrillation) (HCC) first dx 2009   primary cardiologist-  dr victory sharps /  EP cardiologist -- dr melchor ([facility])  s/p  afib ablation 08-02-2016   Pancreatic cyst    x2    Peyronie's disease    Presence of permanent cardiac pacemaker implanted 05-01-2014  at [facility]   Dr.Daubert -[facility] follows   Pulmonary nodule, right    upper and middle lobes --  per last CT stable   Type 2 diabetes mellitus treated with insulin  (HCC)    Wears glasses     Past Surgical History:  Procedure Laterality Date   ANTERIOR CERVICAL DECOMP/DISCECTOMY FUSION N/A 06/02/2016   Procedure: ANTERIOR CERVICAL DECOMPRESSION FUSION CERVICAL 5-6, CERVICAL 6-7 WITH INSTRUMENTATION AND ALLOGRAFT;  Surgeon: Oneil Priestly, MD;  Location: [facility] OR;  Service: Orthopedics;  Laterality: N/A;  ANTERIOR CERVICAL DECOMPRESSION FUSION CERVICAL 5-6, CERVICAL 6-7 WITH INSTRUMENTATION AND ALLOGRAFT   BIOPSY  06/26/2012   Procedure: BIOPSY;  Surgeon: Redell Faith, DO;  Location: [facility] ORS;  Service: General;;   CARDIAC ELECTROPHYSIOLOGY STUDY AND ABLATION  08-02-2016   [facility]   (dr melchor)   pulmonary veins isolation and ablation afib   CARDIAC PACEMAKER PLACEMENT  05-01-2014   [facility]   CARDIOVERSION N/A 01/31/2024   Procedure: CARDIOVERSION;  Surgeon: Lonni Slain, MD;  Location: [facility] INVASIVE CV LAB;  Service: Cardiovascular;  Laterality: N/A;   CHOLECYSTECTOMY  1983   COLONOSCOPY WITH PROPOFOL  N/A 07/13/2015   Procedure: COLONOSCOPY WITH PROPOFOL ;  Surgeon: Gladis MARLA Louder, MD;  Location: [facility] ENDOSCOPY;  Service: Endoscopy;   Laterality: N/A;   ESOPHAGECTOMY  08-21-2012   [facility]   w/ gastric pull-through   ESOPHAGOGASTRODUODENOSCOPY (EGD) WITH ESOPHAGEAL DILATION  multiple x32 per pt approx.--- last one 02-09-2015   recurrent anastomotic stricture post esophagectomy   HIATAL HERNIA REPAIR  06/26/2012   Procedure: LAPAROSCOPIC REPAIR OF HIATAL HERNIA;  Surgeon: Redell Faith, DO;  Location: [facility] ORS;  Service: General;;   HUMERUS IM NAIL Right 12/28/2023   Procedure: INSERTION, INTRAMEDULLARY ROD, HUMERUS;  Surgeon: Dozier Soulier, MD;  Location: [facility] OR[facility];  Service: Orthopedics;  Laterality: Right;   INCISIONAL HERNIA REPAIR  06/26/2012   Procedure: HERNIA REPAIR INCISIONAL;  Surgeon: Redell Faith, DO;  Location: [facility] ORS;  Service: General;;   INGUINAL HERNIA REPAIR Bilateral 1982   JEJUNOSTOMY FEEDING TUBE  12/22/2012   Removed 12/ 2016  approx.   KNEE ARTHROSCOPY Left 1990   KNEE  ARTHROSCOPY Right 12/2019   LAPAROSCOPIC GASTRIC SLEEVE RESECTION  06/2012   started but abandoned d/t discovery of tumor   LAPAROTOMY  07/12/2021   revision of previous gastric conduit, hiatal hernia repair   MASS EXCISION Left 04/04/2013   Procedure: EXCISION OF SCALP MASS;  Surgeon: Redell Faith, DO;  Location: [facility];  Service: General;  Laterality: Left;   pilomatrixoma   NESBIT PROCEDURE N/A 12/09/2016   Procedure: NESBIT PROCEDURE 16 DOT PLICATION;  Surgeon: Mark Ottelin, MD;  Location: [facility] ;  Service: Urology;  Laterality: N/A;   THORACOTOMY  07/12/2021   revision of previous gastric conduit   TONSILLECTOMY  1959   TRANSTHORACIC ECHOCARDIOGRAM  08-06-2015   [facility]   mild LVH, ef 50%/  mild RAE /  moderate LAE/  trivial AR, MR, PR,  and TR    Social History   Socioeconomic History   Marital status: Married    Spouse name: Not on file   Number of children: 4   Years of education: Not on file   Highest education level: Bachelor's degree (e.g., BA, AB, BS)  Occupational History   Occupation: retired,  Medical illustrator, Designer, fashion/clothing  Tobacco Use   Smoking status: Never    Passive exposure: Past   Smokeless tobacco: Never   Tobacco comments:    Never smoked 01/26/24  Vaping Use   Vaping status: Never Used  Substance and Sexual Activity   Alcohol use: Not Currently    Comment: socially   Drug use: Never   Sexual activity: Not Currently  Other Topics Concern   Not on file  Social History Narrative   Household- lives w/ wife   Social Drivers of Health   Financial Resource Strain: Low Risk  (11/06/2023)   Overall Financial Resource Strain (CARDIA)    Difficulty of Paying Living Expenses: Not very hard  Food Insecurity: Patient Declined (12/28/2023)   Hunger Vital Sign    Worried About Running Out of Food in the Last Year: Patient declined    Ran Out of Food in the Last Year: Patient declined  Transportation Needs: No Transportation Needs (12/28/2023)   PRAPARE - Administrator, Civil Service (Medical): No    Lack of Transportation (Non-Medical): No  Physical Activity: Inactive (11/06/2023)   Exercise Vital Sign    Days of Exercise per Week: 0 days    Minutes of Exercise per Session: 30 min  Stress: Stress Concern Present (11/06/2023)   [employer] of Occupational Health - Occupational Stress Questionnaire    Feeling of Stress : Very much  Social Connections: Unknown (12/28/2023)   Social Connection and Isolation Panel    Frequency of Communication with Friends and Family: Once a week    Frequency of Social Gatherings with Friends and Family: Once a week    Attends Religious Services: Patient declined    Database administrator or Organizations: No    Attends Banker Meetings: Never    Marital Status: Married  Catering manager Violence: Unknown (12/28/2023)   Humiliation, Afraid, Rape, and Kick questionnaire    Fear of Current or Ex-Partner: Patient declined    Emotionally Abused: Patient declined    Physically Abused: No    Sexually Abused: Patient declined     Family History  Problem Relation Age of Onset   Hypertension Mother    Heart failure Mother    Prostate cancer Father    Kidney cancer Father    Emphysema Father    Aortic  aneurysm Father    Anorexia nervosa Sister    Depression Sister    Anorexia nervosa Sister    Heart disease Sister    Hypertension Brother    Cancer Maternal Grandmother    Heart attack Maternal Grandfather    Heart disease Maternal Grandfather    Cancer Paternal Grandmother    Alcohol abuse Paternal Grandfather    Early death Paternal Grandfather    Tuberculosis Paternal Grandfather    Colon cancer Neg Hx     ROS: no fevers or chills, productive cough, hemoptysis, dysphasia, odynophagia, melena, hematochezia, dysuria, hematuria, rash, seizure activity, orthopnea, PND, pedal edema, claudication. Remaining systems are negative.  Physical Exam: Well-developed well-nourished in no acute distress.  Skin is warm and dry.  HEENT is normal.  Neck is supple.  Chest is clear to auscultation with normal expansion.  Cardiovascular exam is regular rate and rhythm.  Abdominal exam nontender or distended. No masses palpated. Extremities show no edema. neuro grossly intact   A/P  1 paroxysmal atrial fibrillation-patient had previous ablation at [facility].  However he has had recurrences and is status post repeat cardioversion in May 2025.  He is scheduled for repeat ablation August 2025.  Continue metoprolol  and apixaban .  Note given his history of falls I am concerned about continuing anticoagulation long-term.  Will refer for watchman consideration.  [facility] Referral for Left Atrial Appendage Closure with Non-Valvular Atrial Fibrillation   Armstrong Creasy. is a 72 y.o. male is being referred to the [facility] Team for evaluation for Left Atrial Appendage Closure with Watchman device for the management of stroke risk resulting form non-valvular atrial fibrillation.    Base upon Mr.  Swallow's history, he is felt to be a poor candidate for long-term anticoagulation because of a high risk of recurrent falls.  The patient has a HAS-BLED score of 7 indicating a Yearly Major Bleeding Risk of 12.5%.    His CHADS2-VASc Score is 6 with an unadjusted Ischemic Stroke Rate (% per year) of 9.7%.  His stroke risk necessitates a strategy of stroke prevention with either long-term oral anticoagulation or left atrial appendage occlusion therapy. We have discussed their bleeding risk in the context of their comorbid medical problems, as well as the rationale for referral for evaluation of Watchman left atrial appendage occlusion therapy. While the patient is at high long-term bleeding risk, they may be appropriate for short-term anticoagulation. Based on this individual patient's stroke and bleeding risk, a shared decision has been made to refer the patient for consideration of Watchman left atrial appendage closure utilizing the [employer] shared decision tool.   2 coronary artery disease-moderate on previous CTA.  Patient denies chest pain.  Continue statin.  3 history of pacemaker-Per electrophysiology.  He is nearing need for generator change.  4 history of orthostasis-I again discussed the importance of maintaining hydration and increase sodium intake.  Orthostasis is felt secondary to autonomic dysfunction related to diabetes mellitus.  5 history of hypertension-I have allowed his blood pressure to run higher given history of orthostasis.  6 hyperlipidemia-continue statin.  7 history of elevated liver functions-previously evaluated at [facility].  8 preoperative evaluation prior to repair of Dupuytren's excision which is scheduled April 10, 2024-we discussed this today.  He has no contraindication from a cardiac standpoint other than his atrial fibrillation ablation is scheduled for May 01, 2024.  Eliquis  cannot be held for 4 weeks prior to his ablation.   Therefore  would delay repair of Dupuytren's excision.  9 mild aortic stenosis-he will need follow-up echoes in the future.   Redell Shallow, MD

## 2024-03-26 DIAGNOSIS — R531 Weakness: Secondary | ICD-10-CM | POA: Diagnosis not present

## 2024-03-26 DIAGNOSIS — M25611 Stiffness of right shoulder, not elsewhere classified: Secondary | ICD-10-CM | POA: Diagnosis not present

## 2024-03-26 DIAGNOSIS — M25631 Stiffness of right wrist, not elsewhere classified: Secondary | ICD-10-CM | POA: Diagnosis not present

## 2024-03-26 DIAGNOSIS — M25621 Stiffness of right elbow, not elsewhere classified: Secondary | ICD-10-CM | POA: Diagnosis not present

## 2024-03-28 ENCOUNTER — Telehealth: Payer: Self-pay

## 2024-03-28 ENCOUNTER — Ambulatory Visit

## 2024-03-28 DIAGNOSIS — M25621 Stiffness of right elbow, not elsewhere classified: Secondary | ICD-10-CM | POA: Diagnosis not present

## 2024-03-28 DIAGNOSIS — M25631 Stiffness of right wrist, not elsewhere classified: Secondary | ICD-10-CM | POA: Diagnosis not present

## 2024-03-28 DIAGNOSIS — M25611 Stiffness of right shoulder, not elsewhere classified: Secondary | ICD-10-CM | POA: Diagnosis not present

## 2024-03-28 DIAGNOSIS — R531 Weakness: Secondary | ICD-10-CM | POA: Diagnosis not present

## 2024-03-28 NOTE — Telephone Encounter (Signed)
 Patient with diagnosis of A Fib on Eliquis  for anticoagulation. Patient is scheduled for ablation on 05/01/24  Procedure: LEFT HAND AND LONG FINGER DUPUYTREN'S EXCISION  Date of procedure: 04/10/24   CHA2DS2-VASc Score = 6  This indicates a 9.7% annual risk of stroke. The patient's score is based upon: CHF History: 0 HTN History: 1 Diabetes History: 1 Stroke History: 2 Vascular Disease History: 1 Age Score: 1 Gender Score: 0   CrCl 79 ml/min Platelet count 238K  Not recommended to hold Eliquis  for 4 weeks prior to A fib ablation which is scheduled for 05/01/24. Will need clearance from cardiologist or have procedure rescheduled.

## 2024-03-28 NOTE — Telephone Encounter (Signed)
   Name: Sultan Pargas.  DOB: 10-Dec-1950  MRN: [id number]  Primary Cardiologist: Redell Shallow, MD  Chart reviewed as part of pre-operative protocol coverage. The patient has an upcoming visit scheduled with Dr. Shallow on 04/01/2024 at which time clearance can be addressed in case there are any issues that would impact surgical recommendations.  Left hand and long finger dupuytrens excision is not scheduled until 04/10/2024 as below. I added preop FYI to appointment note so that provider is aware to address at time of outpatient visit.  Per office protocol the cardiology provider should forward their finalized clearance decision and recommendations regarding antiplatelet therapy to the requesting party below.    This message will also be routed to pharmacy pool for input on holding Eliquis  as requested below so that this information is available to the clearing provider at time of patient's appointment.   I will route this message as FYI to requesting party and remove this message from the preop box as separate preop APP input not needed at this time.   Please call with any questions.  Lum LITTIE Louis, NP  03/28/2024, 4:24 PM

## 2024-03-28 NOTE — Telephone Encounter (Signed)
   Pre-operative Risk Assessment    Patient Name: Lawrence [name].  DOB: July 20, 1951 MRN: [id number]   Date of last office visit: 01/11/24 WILL CAMNITZ, MD Date of next office visit: 04/01/24 CAMPBELL SHALLOW, MD   Request for Surgical Clearance    Procedure:  LEFT HAND AND LONG FINGER DUPUYTREN'S EXCISION  Date of Surgery:  Clearance 04/10/24                                Surgeon:  NOT INDICATED Surgeon's Group or Practice Name:  [facility] Phone number:  [phone] Fax number:  [phone]  ATTN: ATHELIA MEDINA   Type of Clearance Requested:   - Medical  - Pharmacy:  Hold Apixaban  (Eliquis )     Type of Anesthesia:  MAC   Additional requests/questions:    Signed, Lucie DELENA Ku   03/28/2024, 2:41 PM

## 2024-03-29 NOTE — Telephone Encounter (Signed)
   Name: Lawrence [name].  DOB: [birth year]  MRN: [id number]  Primary Cardiologist: Redell Shallow, MD  Chart reviewed as part of pre-operative protocol coverage. The patient has an upcoming visit scheduled with Dr. Shallow on 04/01/2024 at which time clearance can be addressed in case there are any issues that would impact surgical recommendations.  LEFT HAND AND LONG FINGER DUPUYTREN'S EXCISION Is not scheduled until 04/10/2024 as below. I added preop FYI to appointment note so that provider is aware to address at time of outpatient visit.  Per office protocol the cardiology provider should forward their finalized clearance decision and recommendations regarding antiplatelet therapy to the requesting party below.     Dr Shallow for input on holding Eliquis  as requested below so that this information is available to the clearing provider at time of patient's appointment. Not recommended to hold Eliquis  for 4 weeks prior to A fib ablation which is scheduled for 05/01/24. Will need clearance from cardiologist or have procedure rescheduled.    I will route this message as FYI to requesting party and remove this message from the preop box as separate preop APP input not needed at this time.   Please call with any questions.  Lamarr Satterfield, NP  03/29/2024, 9:08 AM

## 2024-03-29 NOTE — Telephone Encounter (Signed)
 I will update all parties involved of the appt pt has 04/01/24 with Dr. Pietro. I have added preop clearance to appt notes as well.

## 2024-04-01 ENCOUNTER — Ambulatory Visit: Attending: Cardiology | Admitting: Cardiology

## 2024-04-01 ENCOUNTER — Encounter: Payer: Self-pay | Admitting: Cardiology

## 2024-04-01 VITALS — BP 136/87 | HR 87 | Ht 73.0 in | Wt 226.4 lb

## 2024-04-01 DIAGNOSIS — I480 Paroxysmal atrial fibrillation: Secondary | ICD-10-CM

## 2024-04-01 DIAGNOSIS — I951 Orthostatic hypotension: Secondary | ICD-10-CM

## 2024-04-01 DIAGNOSIS — I2510 Atherosclerotic heart disease of native coronary artery without angina pectoris: Secondary | ICD-10-CM | POA: Diagnosis not present

## 2024-04-01 DIAGNOSIS — Z950 Presence of cardiac pacemaker: Secondary | ICD-10-CM

## 2024-04-01 DIAGNOSIS — I10 Essential (primary) hypertension: Secondary | ICD-10-CM

## 2024-04-01 DIAGNOSIS — E785 Hyperlipidemia, unspecified: Secondary | ICD-10-CM | POA: Diagnosis not present

## 2024-04-01 DIAGNOSIS — E1165 Type 2 diabetes mellitus with hyperglycemia: Secondary | ICD-10-CM | POA: Diagnosis not present

## 2024-04-01 NOTE — Patient Instructions (Signed)

## 2024-04-03 ENCOUNTER — Telehealth: Payer: Self-pay

## 2024-04-03 NOTE — Telephone Encounter (Signed)
 Per Dr. Pietro, called patient to arrange LAAO consult.  Left message to call back.

## 2024-04-03 NOTE — Telephone Encounter (Signed)
 Reached the patient. He stated he needs his wife present to schedule an appointment and requested a call back after 1PM today.

## 2024-04-03 NOTE — Telephone Encounter (Signed)
 Spoke with the patient and his wife at length. The patient needs several procedures in the next few months. His ablation is scheduled 05/01/2024, his PPM is at nearly at [facility], he needs repair of Dupuytren's excision of both hands, and now Watchman.  After much discussion, they agreed that ablation needs to be done first, then figure out pacemaker and hands, and Watchman last as it is their last priority and it would postpone the other procedures.  They opted not to schedule a Watchman consult at this time. They requested to get through the ablation and discuss plan again at 1 month visit on 05/31/2024.  They were grateful for call and agreed with plan.

## 2024-04-04 NOTE — Telephone Encounter (Signed)
 I sent a secure chat to surgery scheduler at [facility] recommendations from Dr. Pietro: preoperative evaluation prior to repair of Dupuytren's excision which is scheduled April 10, 2024-we discussed this today. He has no contraindication from a cardiac standpoint other than his atrial fibrillation ablation is scheduled for May 01, 2024. Eliquis  cannot be held for 4 weeks prior to his ablation. Therefore would delay repair of Dupuytren's excision.    per Dr. Pietro states procedure to be delayed due to pt is having a-fib ablation and cannot stop anticoag x 4 weeks prior. I will fax completes notes as well

## 2024-04-04 NOTE — Telephone Encounter (Signed)
 [name] from Lawrence [name] is requesting a callback at [phone] regarding them checking on the status of the clearance since pt was seen on 04/01/24 and they hadn't received any updates. They'd like it faxed over to [phone]. Please advise.

## 2024-04-05 ENCOUNTER — Encounter: Payer: Self-pay | Admitting: Cardiology

## 2024-04-08 ENCOUNTER — Ambulatory Visit

## 2024-04-08 DIAGNOSIS — M25631 Stiffness of right wrist, not elsewhere classified: Secondary | ICD-10-CM | POA: Diagnosis not present

## 2024-04-08 DIAGNOSIS — I4819 Other persistent atrial fibrillation: Secondary | ICD-10-CM | POA: Diagnosis not present

## 2024-04-08 DIAGNOSIS — M25621 Stiffness of right elbow, not elsewhere classified: Secondary | ICD-10-CM | POA: Diagnosis not present

## 2024-04-08 DIAGNOSIS — M25611 Stiffness of right shoulder, not elsewhere classified: Secondary | ICD-10-CM | POA: Diagnosis not present

## 2024-04-08 DIAGNOSIS — R001 Bradycardia, unspecified: Secondary | ICD-10-CM

## 2024-04-08 DIAGNOSIS — R531 Weakness: Secondary | ICD-10-CM | POA: Diagnosis not present

## 2024-04-08 LAB — CBC
Hematocrit: 40.7 % (ref 37.5–51.0)
Hemoglobin: 13.2 g/dL (ref 13.0–17.7)
MCH: 31.7 pg (ref 26.6–33.0)
MCHC: 32.4 g/dL (ref 31.5–35.7)
MCV: 98 fL — ABNORMAL HIGH (ref 79–97)
Platelets: 178 x10E3/uL (ref 150–450)
RBC: 4.17 x10E6/uL (ref 4.14–5.80)
RDW: 13.5 % (ref 11.6–15.4)
WBC: 6.9 x10E3/uL (ref 3.4–10.8)

## 2024-04-08 LAB — BASIC METABOLIC PANEL WITH GFR
BUN/Creatinine Ratio: 10 (ref 10–24)
BUN: 10 mg/dL (ref 8–27)
CO2: 24 mmol/L (ref 20–29)
Calcium: 9 mg/dL (ref 8.6–10.2)
Chloride: 98 mmol/L (ref 96–106)
Creatinine, Ser: 0.96 mg/dL (ref 0.76–1.27)
Glucose: 271 mg/dL — ABNORMAL HIGH (ref 70–99)
Potassium: 3.7 mmol/L (ref 3.5–5.2)
Sodium: 136 mmol/L (ref 134–144)
eGFR: 84 mL/min/1.73 (ref >59)

## 2024-04-09 DIAGNOSIS — H353132 Nonexudative age-related macular degeneration, bilateral, intermediate dry stage: Secondary | ICD-10-CM | POA: Diagnosis not present

## 2024-04-09 LAB — CUP PACEART REMOTE DEVICE CHECK
Battery Remaining Longevity: 5 mo
Battery Voltage: 2.85 V
Brady Statistic AP VP Percent: 0.07 %
Brady Statistic AP VS Percent: 96.86 %
Brady Statistic AS VP Percent: 0 %
Brady Statistic AS VS Percent: 3.06 %
Brady Statistic RA Percent Paced: 96.47 %
Brady Statistic RV Percent Paced: 0.1 %
Date Time Interrogation Session: 20250804062217
Implantable Lead Connection Status: 753985
Implantable Lead Connection Status: 753985
Implantable Lead Implant Date: 20150827
Implantable Lead Implant Date: 20150827
Implantable Lead Location: 753859
Implantable Lead Location: 753860
Implantable Lead Model: 5076
Implantable Lead Model: 5076
Implantable Pulse Generator Implant Date: 20150827
Lead Channel Impedance Value: 323 Ohm
Lead Channel Impedance Value: 342 Ohm
Lead Channel Impedance Value: 342 Ohm
Lead Channel Impedance Value: 399 Ohm
Lead Channel Pacing Threshold Amplitude: 0.625 V
Lead Channel Pacing Threshold Amplitude: 1 V
Lead Channel Pacing Threshold Pulse Width: 0.4 ms
Lead Channel Pacing Threshold Pulse Width: 0.4 ms
Lead Channel Sensing Intrinsic Amplitude: 12.75 mV
Lead Channel Sensing Intrinsic Amplitude: 12.75 mV
Lead Channel Sensing Intrinsic Amplitude: 2.75 mV
Lead Channel Sensing Intrinsic Amplitude: 2.75 mV
Lead Channel Setting Pacing Amplitude: 1.5 V
Lead Channel Setting Pacing Amplitude: 2 V
Lead Channel Setting Pacing Pulse Width: 0.4 ms
Lead Channel Setting Sensing Sensitivity: 0.9 mV
Zone Setting Status: 755011
Zone Setting Status: 755011

## 2024-04-10 ENCOUNTER — Ambulatory Visit (HOSPITAL_COMMUNITY)
Admission: RE | Admit: 2024-04-10 | Discharge: 2024-04-10 | Disposition: A | Discharge status: Home / Self Care | Source: Ambulatory Visit | Attending: Cardiovascular Disease | Admitting: Cardiovascular Disease

## 2024-04-10 DIAGNOSIS — I4819 Other persistent atrial fibrillation: Secondary | ICD-10-CM | POA: Diagnosis not present

## 2024-04-10 MED ORDER — IOHEXOL 350 MG/ML SOLN
80.0000 mL | Freq: Once | INTRAVENOUS | Status: AC | PRN
  Administered 2024-04-10: 80 mL via INTRAVENOUS

## 2024-04-11 DIAGNOSIS — M25611 Stiffness of right shoulder, not elsewhere classified: Secondary | ICD-10-CM | POA: Diagnosis not present

## 2024-04-11 DIAGNOSIS — M25621 Stiffness of right elbow, not elsewhere classified: Secondary | ICD-10-CM | POA: Diagnosis not present

## 2024-04-11 DIAGNOSIS — R531 Weakness: Secondary | ICD-10-CM | POA: Diagnosis not present

## 2024-04-11 DIAGNOSIS — M25631 Stiffness of right wrist, not elsewhere classified: Secondary | ICD-10-CM | POA: Diagnosis not present

## 2024-04-16 DIAGNOSIS — M25611 Stiffness of right shoulder, not elsewhere classified: Secondary | ICD-10-CM | POA: Diagnosis not present

## 2024-04-16 DIAGNOSIS — M25621 Stiffness of right elbow, not elsewhere classified: Secondary | ICD-10-CM | POA: Diagnosis not present

## 2024-04-16 DIAGNOSIS — R531 Weakness: Secondary | ICD-10-CM | POA: Diagnosis not present

## 2024-04-16 DIAGNOSIS — M25631 Stiffness of right wrist, not elsewhere classified: Secondary | ICD-10-CM | POA: Diagnosis not present

## 2024-04-18 DIAGNOSIS — R531 Weakness: Secondary | ICD-10-CM | POA: Diagnosis not present

## 2024-04-18 DIAGNOSIS — M25631 Stiffness of right wrist, not elsewhere classified: Secondary | ICD-10-CM | POA: Diagnosis not present

## 2024-04-18 DIAGNOSIS — M25611 Stiffness of right shoulder, not elsewhere classified: Secondary | ICD-10-CM | POA: Diagnosis not present

## 2024-04-18 DIAGNOSIS — M25621 Stiffness of right elbow, not elsewhere classified: Secondary | ICD-10-CM | POA: Diagnosis not present

## 2024-04-20 ENCOUNTER — Ambulatory Visit: Payer: Self-pay | Admitting: Cardiology

## 2024-04-23 DIAGNOSIS — M25611 Stiffness of right shoulder, not elsewhere classified: Secondary | ICD-10-CM | POA: Diagnosis not present

## 2024-04-23 DIAGNOSIS — R531 Weakness: Secondary | ICD-10-CM | POA: Diagnosis not present

## 2024-04-23 DIAGNOSIS — M25621 Stiffness of right elbow, not elsewhere classified: Secondary | ICD-10-CM | POA: Diagnosis not present

## 2024-04-23 DIAGNOSIS — M25631 Stiffness of right wrist, not elsewhere classified: Secondary | ICD-10-CM | POA: Diagnosis not present

## 2024-04-24 ENCOUNTER — Telehealth (HOSPITAL_COMMUNITY): Payer: Self-pay

## 2024-04-24 NOTE — Telephone Encounter (Signed)
 Spoke with patient to discuss upcoming procedure.   CT: completed.  Labs: completed.   Any recent signs of acute illness or been started on antibiotics? No Any new medications started? No Any medications to hold?  Adjust Humulin  70/30 Insulin :  The night before your procedure you will only take 70% of your usual dose (4.2 units). Do NOT take the morning of your procedure.  Any missed doses of blood thinner? Reports he missed 2 night time doses on last week. Advised patient of the importance to continue taking ANTICOAGULANT: Eliquis  (Apixaban ) twice daily without missing any doses. Dr. Inocencio made aware. Medication instructions:  On the morning of your procedure DO NOT take any medication., including Eliquis  or the procedure may be rescheduled. Nothing to eat or drink after midnight prior to your procedure.  Confirmed patient is scheduled for Atrial Fibrillation Ablation on Wednesday, August 27 with Dr. Soyla Inocencio. Instructed patient to arrive at the Main Entrance A at [facility]: [address] [place], [place] [address] and check in at Admitting at 6:30 AM.  Advised of plan to go home the same day and will only stay overnight if medically necessary. You MUST have a responsible adult to drive you home and MUST be with you the first 24 hours after you arrive home or your procedure could be cancelled.  Patient verbalized understanding to all instructions provided and agreed to proceed with procedure.

## 2024-04-25 DIAGNOSIS — M25621 Stiffness of right elbow, not elsewhere classified: Secondary | ICD-10-CM | POA: Diagnosis not present

## 2024-04-25 DIAGNOSIS — R531 Weakness: Secondary | ICD-10-CM | POA: Diagnosis not present

## 2024-04-25 DIAGNOSIS — M25611 Stiffness of right shoulder, not elsewhere classified: Secondary | ICD-10-CM | POA: Diagnosis not present

## 2024-04-25 DIAGNOSIS — M25631 Stiffness of right wrist, not elsewhere classified: Secondary | ICD-10-CM | POA: Diagnosis not present

## 2024-04-29 ENCOUNTER — Telehealth: Payer: Self-pay | Admitting: Cardiology

## 2024-04-29 NOTE — Telephone Encounter (Signed)
 Spoke with the patient and advised that they will use anesthesia for his procedure. Patient verbalized understanding.

## 2024-04-29 NOTE — Telephone Encounter (Signed)
 Patient is requesting to know if we will use anesthesia for his upcoming procedure. Please advise.

## 2024-04-30 NOTE — Pre-Procedure Instructions (Signed)
 Attempted to call patient regarding procedure instructions.  Left voicemail on the following items: Arrival time 0515- new arrival time Nothing to eat or drink after midnight No meds AM of procedure Responsible person to drive you home and stay with you for 24 hrs  Have you missed any doses of anti-coagulant Eliquis - should be taken twice a day,  understand you have missed a few doses.  Don't take dose morning of procedure.

## 2024-05-01 ENCOUNTER — Ambulatory Visit (HOSPITAL_COMMUNITY): Admitting: Anesthesiology

## 2024-05-01 ENCOUNTER — Other Ambulatory Visit: Payer: Self-pay

## 2024-05-01 ENCOUNTER — Ambulatory Visit (HOSPITAL_COMMUNITY)
Admission: RE | Disposition: A | Discharge status: Home / Self Care | Payer: Self-pay | Source: Home / Self Care | Attending: Cardiology

## 2024-05-01 ENCOUNTER — Ambulatory Visit (HOSPITAL_BASED_OUTPATIENT_CLINIC_OR_DEPARTMENT_OTHER): Admitting: Anesthesiology

## 2024-05-01 ENCOUNTER — Ambulatory Visit (HOSPITAL_COMMUNITY)
Admission: RE | Admit: 2024-05-01 | Discharge: 2024-05-01 | Disposition: A | Discharge status: Home / Self Care | Attending: Cardiology | Admitting: Cardiology

## 2024-05-01 DIAGNOSIS — I10 Essential (primary) hypertension: Secondary | ICD-10-CM | POA: Insufficient documentation

## 2024-05-01 DIAGNOSIS — I2510 Atherosclerotic heart disease of native coronary artery without angina pectoris: Secondary | ICD-10-CM | POA: Diagnosis not present

## 2024-05-01 DIAGNOSIS — E785 Hyperlipidemia, unspecified: Secondary | ICD-10-CM | POA: Diagnosis not present

## 2024-05-01 DIAGNOSIS — E119 Type 2 diabetes mellitus without complications: Secondary | ICD-10-CM | POA: Insufficient documentation

## 2024-05-01 DIAGNOSIS — I4891 Unspecified atrial fibrillation: Secondary | ICD-10-CM

## 2024-05-01 DIAGNOSIS — I480 Paroxysmal atrial fibrillation: Secondary | ICD-10-CM

## 2024-05-01 DIAGNOSIS — Z950 Presence of cardiac pacemaker: Secondary | ICD-10-CM | POA: Insufficient documentation

## 2024-05-01 DIAGNOSIS — I4819 Other persistent atrial fibrillation: Secondary | ICD-10-CM

## 2024-05-01 DIAGNOSIS — E1165 Type 2 diabetes mellitus with hyperglycemia: Secondary | ICD-10-CM | POA: Diagnosis not present

## 2024-05-01 DIAGNOSIS — K219 Gastro-esophageal reflux disease without esophagitis: Secondary | ICD-10-CM | POA: Diagnosis not present

## 2024-05-01 DIAGNOSIS — Z794 Long term (current) use of insulin: Secondary | ICD-10-CM | POA: Insufficient documentation

## 2024-05-01 DIAGNOSIS — Z8673 Personal history of transient ischemic attack (TIA), and cerebral infarction without residual deficits: Secondary | ICD-10-CM | POA: Diagnosis not present

## 2024-05-01 HISTORY — PX: ATRIAL FIBRILLATION ABLATION: EP1191

## 2024-05-01 LAB — GLUCOSE, CAPILLARY
Glucose-Capillary: 145 mg/dL — ABNORMAL HIGH (ref 70–99)
Glucose-Capillary: 154 mg/dL — ABNORMAL HIGH (ref 70–99)
Glucose-Capillary: 183 mg/dL — ABNORMAL HIGH (ref 70–99)

## 2024-05-01 LAB — POCT ACTIVATED CLOTTING TIME: Activated Clotting Time: 331 s

## 2024-05-01 SURGERY — ATRIAL FIBRILLATION ABLATION
Anesthesia: General

## 2024-05-01 MED ORDER — SUGAMMADEX SODIUM 200 MG/2ML IV SOLN
INTRAVENOUS | Status: DC | PRN
  Administered 2024-05-01: 200 mg via INTRAVENOUS

## 2024-05-01 MED ORDER — CEFAZOLIN SODIUM-DEXTROSE 2-4 GM/100ML-% IV SOLN
INTRAVENOUS | Status: AC
  Filled 2024-05-01: qty 100

## 2024-05-01 MED ORDER — PROPOFOL 10 MG/ML IV BOLUS
INTRAVENOUS | Status: DC | PRN
  Administered 2024-05-01: 150 mg via INTRAVENOUS

## 2024-05-01 MED ORDER — SODIUM CHLORIDE 0.9% FLUSH: 3.0000 mL | Freq: Two times a day (BID) | INTRAVENOUS | Status: DC

## 2024-05-01 MED ORDER — SODIUM CHLORIDE 0.9 % IV SOLN: 250.0000 mL | INTRAVENOUS | Status: DC | PRN

## 2024-05-01 MED ORDER — HEPARIN SODIUM (PORCINE) 1000 UNIT/ML IJ SOLN
INTRAMUSCULAR | Status: DC | PRN
  Administered 2024-05-01: 14000 [IU] via INTRAVENOUS

## 2024-05-01 MED ORDER — PHENYLEPHRINE 80 MCG/ML (10ML) SYRINGE FOR IV PUSH (FOR BLOOD PRESSURE SUPPORT)
PREFILLED_SYRINGE | INTRAVENOUS | Status: DC | PRN
  Administered 2024-05-01: 160 ug via INTRAVENOUS
  Administered 2024-05-01: 80 ug via INTRAVENOUS

## 2024-05-01 MED ORDER — SODIUM CHLORIDE 0.9% FLUSH: 3.0000 mL | INTRAVENOUS | Status: DC | PRN

## 2024-05-01 MED ORDER — ONDANSETRON HCL 4 MG/2ML IJ SOLN
INTRAMUSCULAR | Status: DC | PRN
  Administered 2024-05-01: 4 mg via INTRAVENOUS

## 2024-05-01 MED ORDER — HEPARIN (PORCINE) IN NACL 1000-0.9 UT/500ML-% IV SOLN
INTRAVENOUS | Status: DC | PRN
Start: 2024-05-01 — End: 2024-05-01
  Administered 2024-05-01 (×2): 500 mL

## 2024-05-01 MED ORDER — HEPARIN SODIUM (PORCINE) 1000 UNIT/ML IJ SOLN
INTRAMUSCULAR | Status: AC
  Filled 2024-05-01: qty 10

## 2024-05-01 MED ORDER — PROTAMINE SULFATE 10 MG/ML IV SOLN
INTRAVENOUS | Status: DC | PRN
  Administered 2024-05-01: 5 mg via INTRAVENOUS
  Administered 2024-05-01: 35 mg via INTRAVENOUS

## 2024-05-01 MED ORDER — PHENYLEPHRINE HCL-NACL 20-0.9 MG/250ML-% IV SOLN
INTRAVENOUS | Status: DC | PRN
  Administered 2024-05-01: 20 ug/min via INTRAVENOUS

## 2024-05-01 MED ORDER — HEPARIN SODIUM (PORCINE) 1000 UNIT/ML IJ SOLN
INTRAMUSCULAR | Status: AC
  Filled 2024-05-01: qty 20

## 2024-05-01 MED ORDER — ONDANSETRON HCL 4 MG/2ML IJ SOLN: 4.0000 mg | Freq: Four times a day (QID) | INTRAMUSCULAR | Status: DC | PRN

## 2024-05-01 MED ORDER — FENTANYL CITRATE (PF) 100 MCG/2ML IJ SOLN
INTRAMUSCULAR | Status: AC
  Filled 2024-05-01: qty 2

## 2024-05-01 MED ORDER — FENTANYL CITRATE (PF) 100 MCG/2ML IJ SOLN
INTRAMUSCULAR | Status: DC | PRN
  Administered 2024-05-01: 100 ug via INTRAVENOUS

## 2024-05-01 MED ORDER — ROCURONIUM BROMIDE 10 MG/ML (PF) SYRINGE
PREFILLED_SYRINGE | INTRAVENOUS | Status: DC | PRN
  Administered 2024-05-01: 30 mg via INTRAVENOUS
  Administered 2024-05-01: 70 mg via INTRAVENOUS

## 2024-05-01 MED ORDER — ATROPINE SULFATE 1 MG/10ML IJ SOSY
PREFILLED_SYRINGE | INTRAMUSCULAR | Status: AC
Start: 2024-05-01 — End: 2024-05-01
  Filled 2024-05-01: qty 10

## 2024-05-01 MED ORDER — ATROPINE SULFATE 1 MG/ML IV SOLN
INTRAVENOUS | Status: DC | PRN
  Administered 2024-05-01: 1 mg via INTRAVENOUS

## 2024-05-01 MED ORDER — CEFAZOLIN SODIUM-DEXTROSE 2-4 GM/100ML-% IV SOLN
2.0000 g | Freq: Once | INTRAVENOUS | Status: AC
  Administered 2024-05-01: 2 g via INTRAVENOUS

## 2024-05-01 MED ORDER — DEXAMETHASONE SODIUM PHOSPHATE 10 MG/ML IJ SOLN
INTRAMUSCULAR | Status: DC | PRN
  Administered 2024-05-01: 5 mg via INTRAVENOUS

## 2024-05-01 MED ORDER — SODIUM CHLORIDE 0.9 % IV SOLN: INTRAVENOUS | Status: DC

## 2024-05-01 MED ORDER — ACETAMINOPHEN 325 MG PO TABS: 650.0000 mg | ORAL_TABLET | ORAL | Status: DC | PRN

## 2024-05-01 MED ORDER — LIDOCAINE 2% (20 MG/ML) 5 ML SYRINGE
INTRAMUSCULAR | Status: DC | PRN
  Administered 2024-05-01: 60 mg via INTRAVENOUS

## 2024-05-01 SURGICAL SUPPLY — 20 items
BLANKET WARM UNDERBOD FULL ACC (MISCELLANEOUS) ×1 IMPLANT
CABLE FARASTAR GEN2 SNGL USE (CABLE) IMPLANT
CATH FARAWAVE 2.0 31 (CATHETERS) IMPLANT
CATH GE 8FR SOUNDSTAR (CATHETERS) IMPLANT
CATH OCTARAY 2.0 F 3-3-3-3-3 (CATHETERS) IMPLANT
CATH WEB BI DIR CSDF CRV REPRO (CATHETERS) IMPLANT
CATH WEB BIDIR CS D-F NONAUTO (CATHETERS) IMPLANT
CLOSURE MYNX CONTROL 6F/7F (Vascular Products) IMPLANT
CLOSURE PERCLOSE PROSTYLE (VASCULAR PRODUCTS) IMPLANT
COVER SWIFTLINK CONNECTOR (BAG) ×1 IMPLANT
DILATOR VESSEL 38 20CM 16FR (INTRODUCER) IMPLANT
GUIDEWIRE INQWIRE 1.5J.035X260 (WIRE) IMPLANT
KIT VERSACROSS CNCT FARADRIVE (KITS) IMPLANT
PACK EP LF (CUSTOM PROCEDURE TRAY) ×1 IMPLANT
PAD DEFIB RADIO PHYSIO CONN (PAD) ×1 IMPLANT
PATCH CARTO3 (PAD) IMPLANT
SHEATH FARADRIVE STEERABLE (SHEATH) IMPLANT
SHEATH PINNACLE 8F 10CM (SHEATH) IMPLANT
SHEATH PINNACLE 9F 10CM (SHEATH) IMPLANT
SHEATH PROBE COVER 6X72 (BAG) IMPLANT

## 2024-05-01 NOTE — Transfer of Care (Signed)
 Immediate Anesthesia Transfer of Care Note  Patient: Lawrence [name].  Procedure(s) Performed: ATRIAL FIBRILLATION ABLATION  Patient Location: PACU and Cath Lab  Anesthesia Type:General  Level of Consciousness: drowsy and patient cooperative  Airway & Oxygen Therapy: Patient Spontanous Breathing  Post-op Assessment: Report given to RN and Post -op Vital signs reviewed and stable  Post vital signs: Reviewed and stable  Last Vitals:  Vitals Value Taken Time  BP 136/72 05/01/24 09:10  Temp    Pulse 69 05/01/24 09:14  Resp 18 05/01/24 09:14  SpO2 96 % 05/01/24 09:14  Vitals shown include unfiled device data.  Last Pain:  Vitals:   05/01/24 0549  TempSrc:   PainSc: 0-No pain         Complications: No notable events documented.

## 2024-05-01 NOTE — Progress Notes (Signed)
 Patient ambulated in hall- bil groin are unremarkable.

## 2024-05-01 NOTE — Anesthesia Procedure Notes (Signed)
 Procedure Name: Intubation Date/Time: 05/01/2024 7:46 AM  Performed by: Genny Gun, CRNAPre-anesthesia Checklist: Patient identified, Emergency Drugs available, Suction available, Patient being monitored and Timeout performed Patient Re-evaluated:Patient Re-evaluated prior to induction Oxygen Delivery Method: Circle system utilized Preoxygenation: Pre-oxygenation with 100% oxygen Induction Type: IV induction Ventilation: Mask ventilation without difficulty and Oral airway inserted - appropriate to patient size Laryngoscope Size: Mac and 3 Grade View: Grade I Tube type: Oral Tube size: 7.0 mm Number of attempts: 1 Placement Confirmation: ETT inserted through vocal cords under direct vision, positive ETCO2 and breath sounds checked- equal and bilateral Secured at: 23 cm Tube secured with: Tape Dental Injury: Teeth and Oropharynx as per pre-operative assessment  Comments: 2 handed mask with airway

## 2024-05-01 NOTE — H&P (Signed)
  Electrophysiology Office Note:   Date:  05/01/2024  ID:  Lawrence JONETTA Arnulfo Mickey., DOB [birth year], MRN [id number]  Primary Cardiologist: Redell Shallow, MD Primary Heart Failure: None Electrophysiologist: Anthonella Klausner Gladis Norton, MD      History of Present Illness:   Lawrence [name]. is a 73 y.o. male with h/o paroxysmal atrial fibrillation, elevated coronary calcium  score, symptomatic bradycardia, TIA, hypertension, orthostasis, hyperlipidemia, diabetes seen today for  for Electrophysiology evaluation of pacemaker at the request of Katlyn West.   He had a pacemaker inserted in 2015.  He had a A-fib ablation in 2017 at [facility].  Coronary CT in 2024 showed an elevated calcium  score of 1025 with FFR showing no obstructive disease.  Echo at the time showed ejection fraction of 55 to 60%.  Today, denies symptoms of palpitations, chest pain, dyspnea, orthopnea, PND, lower extremity edema, claudication, dizziness, presyncope, syncope, bleeding, or neurologic sequela. The patient is tolerating medications without difficulties. Plan AF ablation today.      EP Information / Studies Reviewed:    EKG is not ordered today. EKG from 01/01/24 reviewed which showed atrial fibrillation      PPM Interrogation-  reviewed in detail today,  See PACEART report.  Device History: Medtronic Dual Chamber PPM implanted 2015 for Symptomatic bradycardia  Risk Assessment/Calculations:    CHA2DS2-VASc Score = 6   This indicates a 9.7% annual risk of stroke. The patient's score is based upon: CHF History: 0 HTN History: 1 Diabetes History: 1 Stroke History: 2 Vascular Disease History: 1 Age Score: 1 Gender Score: 0            Physical Exam:   VS:  BP (!) 147/57   Pulse 80   Temp 98.4 F (36.9 C) (Oral)   Resp 18   Ht 6' 1 (1.854 m)   Wt 59.4 kg   SpO2 97%   BMI 17.28 kg/m    Wt Readings from Last 3 Encounters:  05/01/24 59.4 kg  04/01/24 102.7 kg  02/19/24 100.7 kg    GEN: Well nourished, well  developed in no acute distress NECK: No JVD; No carotid bruits CARDIAC: Regular rate and rhythm, no murmurs, rubs, gallops RESPIRATORY:  Clear to auscultation without rales, wheezing or rhonchi  ABDOMEN: Soft, non-tender, non-distended EXTREMITIES:  No edema; No deformity    ASSESSMENT AND PLAN:    Symptomatic bradycardia s/p Medtronic PPM  Normal PPM function See Pace Art report Sensing, threshold, impedance within normal limits Programming reviewed and appropriate for patient Madalin Hughart enroll in remote monitoring No changes today  2.  Coronary artery disease: Elevated calcium  score.  FFR negative.  Continue management per primary cardiology.  3.  Atrial fibrillation: Lawrence JONETTA Arnulfo Mickey. has presented today for surgery, with the diagnosis of AF.  The various methods of treatment have been discussed with the patient and family. After consideration of risks, benefits and other options for treatment, the patient has consented to  Procedure(s): Catheter ablation as a surgical intervention .  Risks include but not limited to complete heart block, stroke, esophageal damage, nerve damage, bleeding, vascular damage, tamponade, perforation, MI, and death. The patient's history has been reviewed, patient examined, no change in status, stable for surgery.  I have reviewed the patient's chart and labs.  Questions were answered to the patient's satisfaction.    Briggette Najarian Norton, MD 05/01/2024 7:00 AM

## 2024-05-01 NOTE — Discharge Instructions (Signed)

## 2024-05-01 NOTE — Anesthesia Postprocedure Evaluation (Signed)
 Anesthesia Post Note  Patient: Lawrence [name].  Procedure(s) Performed: ATRIAL FIBRILLATION ABLATION     Patient location during evaluation: PACU Anesthesia Type: General Level of consciousness: awake and alert Pain management: pain level controlled Vital Signs Assessment: post-procedure vital signs reviewed and stable Respiratory status: spontaneous breathing, nonlabored ventilation, respiratory function stable and patient connected to nasal cannula oxygen Cardiovascular status: blood pressure returned to baseline and stable Postop Assessment: no apparent nausea or vomiting Anesthetic complications: no   No notable events documented.  Last Vitals:  Vitals:   05/01/24 1200 05/01/24 1248  BP: 107/63 (!) 141/80  Pulse: 73 72  Resp: 20 15  Temp:    SpO2: 94% 93%    Last Pain:  Vitals:   05/01/24 1248  TempSrc:   PainSc: 0-No pain                 Kairah Leoni L Zaylee Cornia

## 2024-05-01 NOTE — Anesthesia Preprocedure Evaluation (Addendum)
 Anesthesia Evaluation  Patient identified by MRN, date of birth, ID band Patient awake    Reviewed: Allergy & Precautions, NPO status , Patient's Chart, lab work & pertinent test results, reviewed documented beta blocker date and time   Airway Mallampati: III  TM Distance: >3 FB Neck ROM: Full    Dental  (+) Dental Advisory Given, Chipped,    Pulmonary neg pulmonary ROS   Pulmonary exam normal breath sounds clear to auscultation       Cardiovascular hypertension, Pt. on home beta blockers and Pt. on medications + CAD  + dysrhythmias (eliquis ) Atrial Fibrillation + pacemaker + Valvular Problems/Murmurs (mild AS) AS  Rhythm:Irregular Rate:Normal  TTE 2024  1. Left ventricular ejection fraction, by estimation, is 55 to 60%. The  left ventricle has normal function. The left ventricle has no regional  wall motion abnormalities. There is mild left ventricular hypertrophy.  Left ventricular diastolic parameters  are consistent with Grade II diastolic dysfunction (pseudonormalization).  The average left ventricular global longitudinal strain is -11.3 %. The  global longitudinal strain is abnormal.   2. Right ventricular systolic function is normal. The right ventricular  size is normal. There is normal pulmonary artery systolic pressure.   3. The mitral valve is normal in structure. No evidence of mitral valve  regurgitation. No evidence of mitral stenosis.   4. The aortic valve is tricuspid. There is mild calcification of the  aortic valve. There is mild thickening of the aortic valve. Aortic valve  regurgitation is not visualized. Mild aortic valve stenosis. Aortic valve  mean gradient measures 9.0 mmHg.   5. The inferior vena cava is normal in size with greater than 50%  respiratory variability, suggesting right atrial pressure of 3 mmHg.     Neuro/Psych  PSYCHIATRIC DISORDERS Anxiety Depression    TIA   GI/Hepatic Neg liver ROS,  hiatal hernia,GERD  ,,  Endo/Other  diabetes, Type 2, Insulin  Dependent    Renal/GU negative Renal ROS  negative genitourinary   Musculoskeletal negative musculoskeletal ROS (+)    Abdominal   Peds  Hematology negative hematology ROS (+)   Anesthesia Other Findings Esophageal CA s/p esophagectomy  Reproductive/Obstetrics                              Anesthesia Physical Anesthesia Plan  ASA: 3  Anesthesia Plan: General   Post-op Pain Management: Minimal or no pain anticipated   Induction: Intravenous and Rapid sequence  PONV Risk Score and Plan: 2 and Dexamethasone , Ondansetron  and Treatment may vary due to age or medical condition  Airway Management Planned: Oral ETT  Additional Equipment:   Intra-op Plan:   Post-operative Plan: Extubation in OR  Informed Consent: I have reviewed the patients History and Physical, chart, labs and discussed the procedure including the risks, benefits and alternatives for the proposed anesthesia with the patient or authorized representative who has indicated his/her understanding and acceptance.     Dental advisory given  Plan Discussed with: CRNA  Anesthesia Plan Comments:          Anesthesia Quick Evaluation

## 2024-05-02 ENCOUNTER — Encounter (HOSPITAL_COMMUNITY): Payer: Self-pay | Admitting: Cardiology

## 2024-05-02 ENCOUNTER — Telehealth (HOSPITAL_COMMUNITY): Payer: Self-pay

## 2024-05-02 MED FILL — Atropine Sulfate Soln Prefill Syr 1 MG/10ML (0.1 MG/ML): INTRAMUSCULAR | Qty: 10 | Status: AC

## 2024-05-02 NOTE — Telephone Encounter (Signed)
 Spoke with patient to complete post procedure follow up call.  Patient reports no complications with groin sites.   Instructions reviewed with patient:  Remove large bandage at puncture site after 24 hours. It is normal to have bruising, tenderness, mild swelling, and a pea or marble sized lump/knot at the groin site which can take up to three months to resolve.  Get help right away if you notice sudden swelling at the puncture site.  Check your puncture site every day for signs of infection: fever, redness, swelling, pus drainage, warmth, foul odor or excessive pain. If this occurs, please call [phone], to speak with the nurse. Get help right away if your puncture site is bleeding and the bleeding does not stop after applying firm pressure to the area.  You may continue to have skipped beats/ atrial fibrillation during the first several months after your procedure.  It is very important not to miss any doses of your blood thinner Eliquis .    You will follow up with the Afib clinic on 05/31/24 and follow up with the Afib clinic 07/30/24.    Patient verbalized understanding to all instructions provided.

## 2024-05-08 ENCOUNTER — Telehealth: Payer: Self-pay | Admitting: Internal Medicine

## 2024-05-08 MED ORDER — HYDROXYZINE HCL 25 MG PO TABS: 25.0000 mg | ORAL_TABLET | Freq: Two times a day (BID) | ORAL | 1 refills | Status: DC | PRN

## 2024-05-08 NOTE — Telephone Encounter (Signed)
 LMOM informing Pt that Rx has been sent to [facility] on [address].

## 2024-05-08 NOTE — Telephone Encounter (Signed)
 Rx sent.

## 2024-05-08 NOTE — Telephone Encounter (Signed)
 Copied from CRM #[id number]. Topic: Medicare AWV >> May 08, 2024  9:58 AM Nathanel DEL wrote: Reason for CRM: Called LVM 05/08/2024 to schedule AWV. Please schedule office or virtual visits.  Nathanel Paschal; Care Guide Ambulatory Clinical Support [place] [facility] Direct Dial: [phone]

## 2024-05-08 NOTE — Telephone Encounter (Signed)
 Pt came in requesting refill of hydroxyzine  25 mg to be called in to [facility] on [address] please notify pt when sent in

## 2024-05-09 ENCOUNTER — Ambulatory Visit

## 2024-05-09 DIAGNOSIS — I480 Paroxysmal atrial fibrillation: Secondary | ICD-10-CM

## 2024-05-09 LAB — CUP PACEART REMOTE DEVICE CHECK
Battery Remaining Longevity: 4 mo
Battery Voltage: 2.85 V
Brady Statistic AP VP Percent: 0.07 %
Brady Statistic AP VS Percent: 93.35 %
Brady Statistic AS VP Percent: 0.01 %
Brady Statistic AS VS Percent: 6.58 %
Brady Statistic RA Percent Paced: 93.09 %
Brady Statistic RV Percent Paced: 0.09 %
Date Time Interrogation Session: 20250904084054
Implantable Lead Connection Status: 753985
Implantable Lead Connection Status: 753985
Implantable Lead Implant Date: 20150827
Implantable Lead Implant Date: 20150827
Implantable Lead Location: 753859
Implantable Lead Location: 753860
Implantable Lead Model: 5076
Implantable Lead Model: 5076
Implantable Pulse Generator Implant Date: 20150827
Lead Channel Impedance Value: 323 Ohm
Lead Channel Impedance Value: 342 Ohm
Lead Channel Impedance Value: 342 Ohm
Lead Channel Impedance Value: 399 Ohm
Lead Channel Pacing Threshold Amplitude: 0.625 V
Lead Channel Pacing Threshold Amplitude: 1 V
Lead Channel Pacing Threshold Pulse Width: 0.4 ms
Lead Channel Pacing Threshold Pulse Width: 0.4 ms
Lead Channel Sensing Intrinsic Amplitude: 11.375 mV
Lead Channel Sensing Intrinsic Amplitude: 11.375 mV
Lead Channel Sensing Intrinsic Amplitude: 2.5 mV
Lead Channel Sensing Intrinsic Amplitude: 2.5 mV
Lead Channel Setting Pacing Amplitude: 1.5 V
Lead Channel Setting Pacing Amplitude: 2 V
Lead Channel Setting Pacing Pulse Width: 0.4 ms
Lead Channel Setting Sensing Sensitivity: 0.9 mV
Zone Setting Status: 755011
Zone Setting Status: 755011

## 2024-05-10 ENCOUNTER — Ambulatory Visit: Payer: Self-pay | Admitting: Cardiology

## 2024-05-14 ENCOUNTER — Encounter: Payer: Self-pay | Admitting: Endocrinology

## 2024-05-14 ENCOUNTER — Ambulatory Visit: Payer: Self-pay | Admitting: Endocrinology

## 2024-05-14 ENCOUNTER — Ambulatory Visit: Admitting: Endocrinology

## 2024-05-14 VITALS — BP 112/82 | HR 87 | Resp 20 | Ht 73.0 in | Wt 230.4 lb

## 2024-05-14 DIAGNOSIS — E1142 Type 2 diabetes mellitus with diabetic polyneuropathy: Secondary | ICD-10-CM

## 2024-05-14 DIAGNOSIS — Z794 Long term (current) use of insulin: Secondary | ICD-10-CM

## 2024-05-14 DIAGNOSIS — E1165 Type 2 diabetes mellitus with hyperglycemia: Secondary | ICD-10-CM | POA: Diagnosis not present

## 2024-05-14 LAB — POCT GLYCOSYLATED HEMOGLOBIN (HGB A1C): Hemoglobin A1C: 7.3 % — AB (ref 4.0–5.6)

## 2024-05-14 MED ORDER — FREESTYLE LIBRE 2 PLUS SENSOR MISC: 1.0000 | 3 refills | Status: AC

## 2024-05-14 MED ORDER — HUMULIN 70/30 (70-30) 100 UNIT/ML ~~LOC~~ SUSP: SUBCUTANEOUS | 4 refills | Status: DC

## 2024-05-14 NOTE — Patient Instructions (Signed)
 Humulin  70/30 :  22 units in the morning with breakfast and  6 units in the evening with supper.

## 2024-05-14 NOTE — Progress Notes (Unsigned)
 Outpatient Endocrinology Note Lawrence Monterrius Cardosa, MD  05/15/24  Patient's Name: Lawrence [name].    DOB: [birth year]    MRN: [id number]                                                    REASON OF VISIT: Follow-up of type 2 diabetes mellitus   REFERRING PROVIDER: Amon Aloysius BRAVO, MD  PCP: Lawrence Aloysius BRAVO, MD  HISTORY OF PRESENT ILLNESS:   Lawrence Mode. is a 73 y.o. old male with past medical history listed below, is here for follow-up of  type 2 diabetes mellitus.   Pertinent Diabetes History: Patient was diagnosed with type 2 diabetes mellitus in 2013 at that time hemoglobin A1c was 8.6%.  From 2022 he was following with [facility] for the diabetes care, he was hospitalized postoperatively at that time for surgical intervention for post esophagectomy anastomotic leak, he was also doing telemedicine visits as well, he was last time seen in March 2024. He wants to transfer over his diabetes care in this clinic, was initially seen in August 2024.  Patient has history of stage II esophageal adenocarcinoma status post exploratory laparotomy for transhiatal esophagogastrectomy in 2013.  Chronic Diabetes Complications : Retinopathy: yes. Following with ophthalmology / retinal specialist, every 6 months.  Nephropathy: no Peripheral neuropathy: yes, numbness and tingling + Coronary artery disease: no Stroke: no  Relevant comorbidities and cardiovascular risk factors: Obesity: yes Body mass index is 30.4 kg/m.  Hypertension: yes Hyperlipidemia. Yes, on statin   Current / Home Diabetic regimen includes:  Humulin  70/30 insulin  22 units with breakfast and 6 units with supper.  Prior diabetic medications: Metformin , stopped due to diarrhea.   Glycemic data:    CONTINUOUS GLUCOSE MONITORING SYSTEM (CGMS) INTERPRETATION: At today's visit, we reviewed CGM downloads. The full report is scanned in the media. Reviewing the CGM trends, blood glucose are as follows:  FreeStyle Libre 2  CGM-  Sensor Download (Sensor download was reviewed and summarized below.) Dates: August 27 to September 9 , 2025, 14 days Sensor Average: 172 Glucose Management Indicator: 7.4%  % data captured: 70%     Interpretation: Frequent hyperglycemia with blood sugar up to 350 range postprandially with different meals breakfast, lunch and supper.  Blood sugar most of other times in between the meals and overnight acceptable.  No concerning hypoglycemia.  Hypoglycemia: Patient has no hypoglycemic episodes. Patient has hypoglycemia awareness.  Factors modifying glucose control: 1.  Diabetic diet assessment: He generally eats a small frequent meals.  He has habits of drinking wine daily and cutting down.  2.  Staying active or exercising:   3.  Medication compliance: compliant all of the time.  # Elevated alkaline phosphatase likely related to liver disorder:  -Patient has elevated alkaline phosphatase since August 2023.  Previously he had normal alkaline phosphatase.. Bone scan showed overall increased tracer uptake throughout the axial and appendicular skeleton raising question of metabolic bone disease.  Alkaline phosphatase 138 to 171 with upper normal limit of 126.  Mildly elevated liver enzyme AST and ALT.  Elevated GGT, 1 36-141 with upper normal limit of 65. -He has normal serum calcium .  Normal vitamin D  level.  Normal PSA. -Patient has been drinking alcohol significantly mainly wine almost every day. -Patient has been following with gastroenterology at [facility] gastroenterology.  Interval history  CGM data as reviewed above.  Hemoglobin A1c 7.3%.  He has been frequently taking insulin  after meals.  No other complaints today.  REVIEW OF SYSTEMS As per history of present illness.   PAST MEDICAL HISTORY: Past Medical History:  Diagnosis Date   Anxiety 01/20/2023   Bradycardia    Cancer (HCC) 2013   esophageal cancer   Depression    Dysrhythmia    Afib   GERD (gastroesophageal  reflux disease)    H/O hiatal hernia    s/p  repair 06-26-2012   History of esophagectomy 08-21-2012  AT [facility]   History of kidney stones 2006   History of malignant neoplasm of esophagus ONCOLOGIST-  DR D'AMICO AT [facility]-- PER LAST NOTE 01/ 2018  NO RECURRENCE   dx 10/ 2013  Stage 2A (T2 N0)  08-21-2012 s/p  esophagectomy w/ gastric pull-through [facility]) post residual recurrent anastomotic stricture (multiple EGD w/ dilations)   History of transient ischemic attack (TIA) 2003   no residual   Hypertension    Long term (current) use of anticoagulants    xarelto    Neuromuscular disorder (HCC)    neuropathy of feet   On dofetilide  therapy    PAF (paroxysmal atrial fibrillation) (HCC) first dx 2009   primary cardiologist-  dr victory sharps /  EP cardiologist -- dr melchor ([facility])  s/p  afib ablation 08-02-2016   Pancreatic cyst    x2    Peyronie's disease    Presence of permanent cardiac pacemaker implanted 05-01-2014  at [facility]   Dr.Daubert -[facility] follows   Pulmonary nodule, right    upper and middle lobes --  per last CT stable   Type 2 diabetes mellitus treated with insulin  (HCC)    Wears glasses     PAST SURGICAL HISTORY: Past Surgical History:  Procedure Laterality Date   ANTERIOR CERVICAL DECOMP/DISCECTOMY FUSION N/A 06/02/2016   Procedure: ANTERIOR CERVICAL DECOMPRESSION FUSION CERVICAL 5-6, CERVICAL 6-7 WITH INSTRUMENTATION AND ALLOGRAFT;  Surgeon: Oneil Priestly, MD;  Location: [facility] OR;  Service: Orthopedics;  Laterality: N/A;  ANTERIOR CERVICAL DECOMPRESSION FUSION CERVICAL 5-6, CERVICAL 6-7 WITH INSTRUMENTATION AND ALLOGRAFT   ATRIAL FIBRILLATION ABLATION N/A 05/01/2024   Procedure: ATRIAL FIBRILLATION ABLATION;  Surgeon: Inocencio Soyla Lunger, MD;  Location: [facility] INVASIVE CV LAB;  Service: Cardiovascular;  Laterality: N/A;   BIOPSY  06/26/2012   Procedure: BIOPSY;  Surgeon: Redell Faith, DO;  Location: [facility] ORS;  Service: General;;   CARDIAC ELECTROPHYSIOLOGY STUDY AND  ABLATION  08-02-2016   [facility]   (dr melchor)   pulmonary veins isolation and ablation afib   CARDIAC PACEMAKER PLACEMENT  05-01-2014   [facility]   CARDIOVERSION N/A 01/31/2024   Procedure: CARDIOVERSION;  Surgeon: Lonni Slain, MD;  Location: [facility] INVASIVE CV LAB;  Service: Cardiovascular;  Laterality: N/A;   CHOLECYSTECTOMY  1983   COLONOSCOPY WITH PROPOFOL  N/A 07/13/2015   Procedure: COLONOSCOPY WITH PROPOFOL ;  Surgeon: Lunger MARLA Louder, MD;  Location: [facility] ENDOSCOPY;  Service: Endoscopy;  Laterality: N/A;   ESOPHAGECTOMY  08-21-2012   [facility]   w/ gastric pull-through   ESOPHAGOGASTRODUODENOSCOPY (EGD) WITH ESOPHAGEAL DILATION  multiple x32 per pt approx.--- last one 02-09-2015   recurrent anastomotic stricture post esophagectomy   HIATAL HERNIA REPAIR  06/26/2012   Procedure: LAPAROSCOPIC REPAIR OF HIATAL HERNIA;  Surgeon: Redell Faith, DO;  Location: [facility] ORS;  Service: General;;   HUMERUS IM NAIL Right 12/28/2023   Procedure: INSERTION, INTRAMEDULLARY ROD, HUMERUS;  Surgeon: Dozier Soulier, MD;  Location: [facility] ORS;  Service: Orthopedics;  Laterality: Right;   INCISIONAL HERNIA REPAIR  06/26/2012   Procedure: HERNIA REPAIR INCISIONAL;  Surgeon: Redell Faith, DO;  Location: [facility] ORS;  Service: General;;   INGUINAL HERNIA REPAIR Bilateral 1982   JEJUNOSTOMY FEEDING TUBE  12/22/2012   Removed 12/ 2016  approx.   KNEE ARTHROSCOPY Left 1990   KNEE ARTHROSCOPY Right 12/2019   LAPAROSCOPIC GASTRIC SLEEVE RESECTION  06/2012   started but abandoned d/t discovery of tumor   LAPAROTOMY  07/12/2021   revision of previous gastric conduit, hiatal hernia repair   MASS EXCISION Left 04/04/2013   Procedure: EXCISION OF SCALP MASS;  Surgeon: Redell Faith, DO;  Location: [facility] ORS;  Service: General;  Laterality: Left;   pilomatrixoma   NESBIT PROCEDURE N/A 12/09/2016   Procedure: NESBIT PROCEDURE 16 DOT PLICATION;  Surgeon: Mark Ottelin, MD;  Location: [facility];  Service: Urology;  Laterality:  N/A;   THORACOTOMY  07/12/2021   revision of previous gastric conduit   TONSILLECTOMY  1959   TRANSTHORACIC ECHOCARDIOGRAM  08-06-2015   [facility]   mild LVH, ef 50%/  mild RAE /  moderate LAE/  trivial AR, MR, PR,  and TR    ALLERGIES: Allergies  Allergen Reactions   Chlorthalidone Other (See Comments)    urinary urgency   Lisinopril  Other (See Comments)    cough   Metformin  Hcl Other (See Comments)    fecal urgency    FAMILY HISTORY:  Family History  Problem Relation Age of Onset   Hypertension Mother    Heart failure Mother    Prostate cancer Father    Kidney cancer Father    Emphysema Father    Aortic aneurysm Father    Anorexia nervosa Sister    Depression Sister    Anorexia nervosa Sister    Heart disease Sister    Hypertension Brother    Cancer Maternal Grandmother    Heart attack Maternal Grandfather    Heart disease Maternal Grandfather    Cancer Paternal Grandmother    Alcohol abuse Paternal Grandfather    Early death Paternal Grandfather    Tuberculosis Paternal Grandfather    Colon cancer Neg Hx     SOCIAL HISTORY: Social History   Socioeconomic History   Marital status: Married    Spouse name: Not on file   Number of children: 4   Years of education: Not on file   Highest education level: Bachelor's degree (e.g., BA, AB, BS)  Occupational History   Occupation: retired, Medical illustrator, Designer, fashion/clothing  Tobacco Use   Smoking status: Never    Passive exposure: Past   Smokeless tobacco: Never   Tobacco comments:    Never smoked 01/26/24  Vaping Use   Vaping status: Never Used  Substance and Sexual Activity   Alcohol use: Not Currently    Comment: socially   Drug use: Never   Sexual activity: Not Currently  Other Topics Concern   Not on file  Social History Narrative   Household- lives w/ wife   Social Drivers of Health   Financial Resource Strain: Low Risk  (11/06/2023)   Overall Financial Resource Strain (CARDIA)    Difficulty of Paying Living  Expenses: Not very hard  Food Insecurity: Patient Declined (12/28/2023)   Hunger Vital Sign    Worried About Running Out of Food in the Last Year: Patient declined    Ran Out of Food in the Last Year: Patient declined  Transportation Needs: No Transportation Needs (  12/28/2023)   PRAPARE - Administrator, Civil Service (Medical): No    Lack of Transportation (Non-Medical): No  Physical Activity: Inactive (11/06/2023)   Exercise Vital Sign    Days of Exercise per Week: 0 days    Minutes of Exercise per Session: 30 min  Stress: Stress Concern Present (11/06/2023)   [employer] of Occupational Health - Occupational Stress Questionnaire    Feeling of Stress : Very much  Social Connections: Unknown (12/28/2023)   Social Connection and Isolation Panel    Frequency of Communication with Friends and Family: Once a week    Frequency of Social Gatherings with Friends and Family: Once a week    Attends Religious Services: Patient declined    Database administrator or Organizations: No    Attends Banker Meetings: Never    Marital Status: Married    MEDICATIONS:  Current Outpatient Medications  Medication Sig Dispense Refill   acetaminophen  (TYLENOL ) 500 MG tablet Take 500-1,000 mg by mouth every 6 (six) hours as needed (pain.).     Alcohol Swabs ([employer] PREP) 70 % PADS USE AS DIRECTED 200 each 2   apixaban  (ELIQUIS ) 5 MG TABS tablet Take 1 tablet (5 mg total) by mouth 2 (two) times daily. 180 tablet 1   Continuous Glucose Sensor (FREESTYLE LIBRE 2 PLUS SENSOR) MISC Inject 1 each into the skin as directed. Change sensor every 15 days 6 each 3   cyanocobalamin  (VITAMIN B12) 1000 MCG tablet Take 1,000 mcg by mouth every evening.     ferrous sulfate 325 (65 FE) MG tablet Take 325 mg by mouth every evening.     fludrocortisone  (FLORINEF ) 0.1 MG tablet Take 1 tablet (0.1 mg total) by mouth daily. 90 tablet 3   FLUoxetine  (PROZAC ) 20 MG tablet Take 3 tablets (60 mg total) by  mouth daily. 270 tablet 1   hydrOXYzine  (ATARAX ) 25 MG tablet Take 1 tablet (25 mg total) by mouth 2 (two) times daily as needed for anxiety (Insomnia). 40 tablet 1   Insulin  Syringe-Needle U-100 (BD VEO INSULIN  SYRINGE U/F) 31G X 15/64 0.3 ML MISC Use as directed in the morning, at noon, in the evening, and at bedtime. 300 each 3   metoprolol  tartrate (LOPRESSOR ) 25 MG tablet Take 1 tablet (25 mg total) by mouth 2 (two) times daily. 180 tablet 2   Multiple Vitamins-Minerals (PRESERVISION AREDS PO) Take 1 tablet by mouth in the morning and at bedtime. Preservision AREDS     oxyCODONE  (OXY IR/ROXICODONE ) 5 MG immediate release tablet Take 1-2 tablets (5-10 mg total) by mouth every 4 (four) hours as needed for moderate pain (pain score 4-6) (pain score 4-6). 30 tablet 0   rosuvastatin  (CRESTOR ) 40 MG tablet Take 1 tablet (40 mg total) by mouth daily. 90 tablet 1   tiZANidine  (ZANAFLEX ) 4 MG tablet Take 1 tablet (4 mg total) by mouth every 8 (eight) hours as needed. 30 tablet 0   HUMULIN  70/30 (70-30) 100 UNIT/ML injection 22 UNITS IN THE MORNING WITH BREAKFAST AND 6 UNITS IN THE EVENING WITH SUPPER. 20 mL 4   No current facility-administered medications for this visit.    PHYSICAL EXAM: Vitals:   05/14/24 0822  BP: 112/82  Pulse: 87  Resp: 20  SpO2: 97%  Weight: 230 lb 6.4 oz (104.5 kg)  Height: 6' 1 (1.854 m)    Body mass index is 30.4 kg/m.  Wt Readings from Last 3 Encounters:  05/14/24 230 lb 6.4 oz (  104.5 kg)  05/01/24 131 lb (59.4 kg)  04/01/24 226 lb 6.4 oz (102.7 kg)    General: Well developed, well nourished male in no apparent distress.  HEENT: AT/[place], no external lesions.  Eyes: Conjunctiva clear and no icterus. Neck: Neck supple  Lungs: Respirations not labored Neurologic: Alert, oriented, normal speech Extremities / Skin: Dry.  Psychiatric: Does not appear depressed or anxious  Diabetic Foot Exam - Simple   No data filed    LABS Reviewed Lab Results  Component  Value Date   HGBA1C 7.3 (A) 05/14/2024   HGBA1C 6.9 (A) 02/07/2024   HGBA1C 6.8 (A) 11/07/2023   No results found for: FRUCTOSAMINE Lab Results  Component Value Date   CHOL 118 01/05/2023   HDL 42 01/05/2023   LDLCALC 44 01/05/2023   TRIG 197 (H) 01/05/2023   CHOLHDL 2.8 01/05/2023   Lab Results  Component Value Date   MICRALBCREAT 63 (H) 05/14/2024    Lab Results  Component Value Date   CREATININE 0.96 04/08/2024   No results found for: GFR  ASSESSMENT / PLAN  1. Uncontrolled type 2 diabetes mellitus with hyperglycemia (HCC)   2. Type 2 diabetes mellitus with hyperglycemia, with long-term current use of insulin  (HCC)     Diabetes Mellitus type 2, complicated by diabetic retinopathy and neuropathy. - Diabetic status / severity: Uncontrolled.  Lab Results  Component Value Date   HGBA1C 7.3 (A) 05/14/2024    - Hemoglobin A1c goal : <6.5%  CGM data as reviewed above, he still with postprandial hyperglycemia.  Discussed about timing of the insulin  asked to take 30 minutes before eating.  He has been mostly taking after eating.  - Medications: See below.  Change on the dose.  I)  Humulin  70/30 :  22 units in the morning with breakfast and   6 units in the evening with supper.    - will likely avoid GLP-1 receptor agonist, history of gastric/esophageal surgery and upper GI symptoms.  Patient is asked to take insulin  15 to 30 minutes before eating.  - Home glucose testing: CGM/freestyle libre and check as needed. - Discussed/ Gave Hypoglycemia treatment plan.  # Consult : not required at this time.   # Annual urine for microalbuminuria/ creatinine ratio, + microalbuminuria currently.  Will check today.  Last  Lab Results  Component Value Date   MICRALBCREAT 63 (H) 05/14/2024     # Foot check nightly / neuropathy.  Advised to follow-up with podiatry.  # He has diabetic retinopathy, following with ophthalmology every 6 months.  - Diet: Eat reasonable  portion sizes to promote a healthy weight - Life style / activity / exercise: Discussed.  2. Blood pressure  -  BP Readings from Last 1 Encounters:  05/14/24 112/82    - Control is in target.  - No change in current plans.  3. Lipid status / Hyperlipidemia - Last  Lab Results  Component Value Date   LDLCALC 44 01/05/2023   - Continue rosuvastatin  40 mg daily.  Managed by PCP.   Diagnoses and all orders for this visit:  Uncontrolled type 2 diabetes mellitus with hyperglycemia (HCC) -     Continuous Glucose Sensor (FREESTYLE LIBRE 2 PLUS SENSOR) MISC; Inject 1 each into the skin as directed. Change sensor every 15 days -     POCT glycosylated hemoglobin (Hb A1C) -     Microalbumin / creatinine urine ratio -     HUMULIN  70/30 (70-30) 100 UNIT/ML injection; 22 UNITS IN  THE MORNING WITH BREAKFAST AND 6 UNITS IN THE EVENING WITH SUPPER.  Type 2 diabetes mellitus with hyperglycemia, with long-term current use of insulin  (HCC)   DISPOSITION Follow up in clinic in 3  months suggested.  Labs on the same day of the visit.   All questions answered and patient verbalized understanding of the plan.  Lawrence Maryana Pittmon, MD [facility] [address] [place], [address] [place], [place] [address] Phone # [phone]  At least part of this note was generated using voice recognition software. Inadvertent word errors may have occurred, which were not recognized during the proofreading process.

## 2024-05-15 LAB — MICROALBUMIN / CREATININE URINE RATIO
Creatinine, Urine: 198 mg/dL (ref 20–320)
Microalb Creat Ratio: 63 mg/g{creat} — ABNORMAL HIGH (ref <30)
Microalb, Ur: 12.4 mg/dL

## 2024-05-17 ENCOUNTER — Ambulatory Visit: Admitting: *Deleted

## 2024-05-17 ENCOUNTER — Telehealth: Payer: Self-pay | Admitting: *Deleted

## 2024-05-17 VITALS — Ht 73.0 in | Wt 230.0 lb

## 2024-05-17 DIAGNOSIS — I10 Essential (primary) hypertension: Secondary | ICD-10-CM

## 2024-05-17 DIAGNOSIS — Z794 Long term (current) use of insulin: Secondary | ICD-10-CM

## 2024-05-17 DIAGNOSIS — E113219 Type 2 diabetes mellitus with mild nonproliferative diabetic retinopathy with macular edema, unspecified eye: Secondary | ICD-10-CM

## 2024-05-17 DIAGNOSIS — Z0000 Encounter for general adult medical examination without abnormal findings: Secondary | ICD-10-CM | POA: Diagnosis not present

## 2024-05-17 MED ORDER — COVID-19 MRNA VAC-TRIS(PFIZER) 30 MCG/0.3ML IM SUSY: 0.3000 mL | PREFILLED_SYRINGE | Freq: Once | INTRAMUSCULAR | 0 refills | Status: AC

## 2024-05-17 MED ORDER — COVID-19 MRNA VAC-TRIS(PFIZER) 30 MCG/0.3ML IM SUSY: 0.3000 mL | PREFILLED_SYRINGE | Freq: Once | INTRAMUSCULAR | 0 refills | Status: DC

## 2024-05-17 NOTE — Patient Instructions (Addendum)
 Lawrence [name] , Thank you for taking time out of your busy schedule to complete your Annual Wellness Visit with me. I enjoyed our conversation and look forward to speaking with you again next year. I, as well as your care team,  appreciate your ongoing commitment to your health goals. Please review the following plan we discussed and let me know if I can assist you in the future. Your Game plan/ To Do List    Follow up Visits: Next Medicare AWV with our clinical staff:  05/23/25 9am, telephone.  Next Office Visit with your provider: 05/28/24 9am, Dr Amon.   Clinician Recommendations:  Aim for 30 minutes of exercise or brisk walking, 6-8 glasses of water , and 5 servings of fruits and vegetables each day.       This is a list of the screening recommended for you and due dates:  Health Maintenance  Topic Date Due   Medicare Annual Wellness Visit  03/26/2024   Flu Shot  04/05/2024   COVID-19 Vaccine (8 - Pfizer risk 2024-25 season) 05/06/2024   Colon Cancer Screening  05/17/2024   Complete foot exam   08/07/2024   Eye exam for diabetics  09/11/2024   Hemoglobin A1C  11/11/2024   Yearly kidney function blood test for diabetes  04/08/2025   Yearly kidney health urinalysis for diabetes  05/14/2025   DTaP/Tdap/Td vaccine (2 - Td or Tdap) 04/01/2027   Pneumococcal Vaccine for age over 36  Completed   Hepatitis C Screening  Completed   Zoster (Shingles) Vaccine  Completed   HPV Vaccine  Aged Out   Meningitis B Vaccine  Aged Out    Advanced directives: (In Chart) A copy of your advanced directives are scanned into your chart should your provider ever need it. Advance Care Planning is important because it:  [x]  Makes sure you receive the medical care that is consistent with your values, goals, and preferences  [x]  It provides guidance to your family and loved ones and reduces their decisional burden about whether or not they are making the right decisions based on your wishes.  Follow the link  provided in your after visit summary or read over the paperwork we have mailed to you to help you started getting your Advance Directives in place. If you need assistance in completing these, please reach out to us  so that we can help you!  See attachments for Preventive Care and Fall Prevention Tips.

## 2024-05-17 NOTE — Progress Notes (Addendum)
 Please attest this visit in the absence of patient primary care provider.    Subjective:   Lawrence Kasler. is a 73 y.o. who presents for a Medicare Wellness preventive visit.  As a reminder, Annual Wellness Visits don't include a physical exam, and some assessments may be limited, especially if this visit is performed virtually. We may recommend an in-person follow-up visit with your provider if needed.  Visit Complete: Virtual I connected with  Lawrence [name]. on 05/17/24 by a audio enabled telemedicine application and verified that I am speaking with the correct person using two identifiers.  Patient Location: Home  Provider Location: Office/Clinic  I discussed the limitations of evaluation and management by telemedicine. The patient expressed understanding and agreed to proceed.  Vital Signs: Because this visit was a virtual/telehealth visit, some criteria may be missing or patient reported. Any vitals not documented were not able to be obtained and vitals that have been documented are patient reported.  VideoDeclined- This patient declined Librarian, academic. Therefore the visit was completed with audio only.  Persons Participating in Visit: Patient.  AWV Questionnaire: No: Patient Medicare AWV questionnaire was not completed prior to this visit.  Cardiac Risk Factors include: advanced age (>23men, >40 women);hypertension;diabetes mellitus;dyslipidemia;male gender;Other (see comment), Risk factor comments: A-fib, hx of esophageal cancer, TIA     Objective:    Today's Vitals   05/17/24 0900 05/17/24 1051  Weight: 230 lb (104.3 kg)   Height: 6' 1 (1.854 m)   PainSc:  0-No pain   Body mass index is 30.34 kg/m.     05/17/2024    9:26 AM 05/01/2024    5:50 AM 01/31/2024    9:00 AM 12/28/2023    8:06 AM 12/27/2023    8:37 AM 12/24/2023    8:47 PM 09/15/2023    6:46 AM  Advanced Directives  Does Patient Have a Medical Advance Directive?  Yes Yes Yes Yes Yes Yes Yes  Type of Estate agent of [place];Living will;Out of facility DNR (pink MOST or yellow form) Healthcare Power of [place];Living will Healthcare Power of Attorney Living will;Healthcare Power of [employer] Power of [place];Living will Healthcare Power of [place];Living will;Out of facility DNR (pink MOST or yellow form) Living will;Healthcare Power of Attorney  Does patient want to make changes to medical advance directive? No - Patient declined No - Patient declined Yes (Inpatient - patient defers changing a medical advance directive at this time - Information given) No - Patient declined No - Patient declined    Copy of Healthcare Power of Attorney in Chart? Yes - validated most recent copy scanned in chart (See row information) No - copy requested Yes - validated most recent copy scanned in chart (See row information) No - copy requested No - copy requested      Current Medications (verified) Outpatient Encounter Medications as of 05/17/2024  Medication Sig   acetaminophen  (TYLENOL ) 500 MG tablet Take 500-1,000 mg by mouth every 6 (six) hours as needed (pain.).   Alcohol Swabs ([employer] PREP) 70 % PADS USE AS DIRECTED   apixaban  (ELIQUIS ) 5 MG TABS tablet Take 1 tablet (5 mg total) by mouth 2 (two) times daily.   Continuous Glucose Sensor (FREESTYLE LIBRE 2 PLUS SENSOR) MISC Inject 1 each into the skin as directed. Change sensor every 15 days   cyanocobalamin  (VITAMIN B12) 1000 MCG tablet Take 1,000 mcg by mouth every evening.   ferrous sulfate 325 (65 FE) MG tablet  Take 325 mg by mouth every evening.   fludrocortisone  (FLORINEF ) 0.1 MG tablet Take 1 tablet (0.1 mg total) by mouth daily.   FLUoxetine  (PROZAC ) 20 MG tablet Take 3 tablets (60 mg total) by mouth daily.   HUMULIN  70/30 (70-30) 100 UNIT/ML injection 22 UNITS IN THE MORNING WITH BREAKFAST AND 6 UNITS IN THE EVENING WITH SUPPER.   hydrOXYzine  (ATARAX ) 25 MG tablet Take 1 tablet (25  mg total) by mouth 2 (two) times daily as needed for anxiety (Insomnia).   Insulin  Syringe-Needle U-100 (BD VEO INSULIN  SYRINGE U/F) 31G X 15/64 0.3 ML MISC Use as directed in the morning, at noon, in the evening, and at bedtime.   metoprolol  tartrate (LOPRESSOR ) 25 MG tablet Take 1 tablet (25 mg total) by mouth 2 (two) times daily.   Multiple Vitamins-Minerals (PRESERVISION AREDS PO) Take 1 tablet by mouth in the morning and at bedtime. Preservision AREDS   rosuvastatin  (CRESTOR ) 40 MG tablet Take 1 tablet (40 mg total) by mouth daily.   tiZANidine  (ZANAFLEX ) 4 MG tablet Take 1 tablet (4 mg total) by mouth every 8 (eight) hours as needed.   [DISCONTINUED] COVID-19 mRNA vaccine, Pfizer, (COMIRNATY) syringe Inject 0.3 mLs into the muscle once for 1 dose.   COVID-19 mRNA vaccine, Pfizer, (COMIRNATY) syringe Inject 0.3 mLs into the muscle once for 1 dose.   [DISCONTINUED] COVID-19 mRNA vaccine, Pfizer, (COMIRNATY) syringe Inject 0.3 mLs into the muscle once for 1 dose.   [DISCONTINUED] COVID-19 mRNA vaccine, Pfizer, (COMIRNATY) syringe Inject 0.3 mLs into the muscle once for 1 dose.   [DISCONTINUED] oxyCODONE  (OXY IR/ROXICODONE ) 5 MG immediate release tablet Take 1-2 tablets (5-10 mg total) by mouth every 4 (four) hours as needed for moderate pain (pain score 4-6) (pain score 4-6). (Patient not taking: Reported on 05/17/2024)   No facility-administered encounter medications on file as of 05/17/2024.    Allergies (verified) Chlorthalidone, Lisinopril , and Metformin  hcl   History: Past Medical History:  Diagnosis Date   Anxiety 01/20/2023   Bradycardia    Cancer (HCC) 2013   esophageal cancer   Depression    Dysrhythmia    Afib   GERD (gastroesophageal reflux disease)    H/O hiatal hernia    s/p  repair 06-26-2012   History of esophagectomy 08-21-2012  AT [facility]   History of kidney stones 2006   History of malignant neoplasm of esophagus ONCOLOGIST-  DR D'AMICO AT [facility]-- PER LAST NOTE 01/  2018  NO RECURRENCE   dx 10/ 2013  Stage 2A (T2 N0)  08-21-2012 s/p  esophagectomy w/ gastric pull-through [facility]) post residual recurrent anastomotic stricture (multiple EGD w/ dilations)   History of transient ischemic attack (TIA) 2003   no residual   Hypertension    Long term (current) use of anticoagulants    xarelto    Neuromuscular disorder (HCC)    neuropathy of feet   On dofetilide  therapy    PAF (paroxysmal atrial fibrillation) (HCC) first dx 2009   primary cardiologist-  dr victory sharps /  EP cardiologist -- dr melchor ([facility])  s/p  afib ablation 08-02-2016   Pancreatic cyst    x2    Peyronie's disease    Presence of permanent cardiac pacemaker implanted 05-01-2014  at [facility]   Dr.Daubert -[facility] follows   Pulmonary nodule, right    upper and middle lobes --  per last CT stable   Type 2 diabetes mellitus treated with insulin  (HCC)    Wears glasses  Past Surgical History:  Procedure Laterality Date   ANTERIOR CERVICAL DECOMP/DISCECTOMY FUSION N/A 06/02/2016   Procedure: ANTERIOR CERVICAL DECOMPRESSION FUSION CERVICAL 5-6, CERVICAL 6-7 WITH INSTRUMENTATION AND ALLOGRAFT;  Surgeon: Oneil Priestly, MD;  Location: [facility] OR;  Service: Orthopedics;  Laterality: N/A;  ANTERIOR CERVICAL DECOMPRESSION FUSION CERVICAL 5-6, CERVICAL 6-7 WITH INSTRUMENTATION AND ALLOGRAFT   ATRIAL FIBRILLATION ABLATION N/A 05/01/2024   Procedure: ATRIAL FIBRILLATION ABLATION;  Surgeon: Inocencio Soyla Lunger, MD;  Location: [facility] INVASIVE CV LAB;  Service: Cardiovascular;  Laterality: N/A;   BIOPSY  06/26/2012   Procedure: BIOPSY;  Surgeon: Redell Faith, DO;  Location: [facility] ORS;  Service: General;;   CARDIAC ELECTROPHYSIOLOGY STUDY AND ABLATION  08-02-2016   [facility]   (dr melchor)   pulmonary veins isolation and ablation afib   CARDIAC PACEMAKER PLACEMENT  05-01-2014   [facility]   CARDIOVERSION N/A 01/31/2024   Procedure: CARDIOVERSION;  Surgeon: Lonni Slain, MD;  Location: [facility] INVASIVE CV LAB;   Service: Cardiovascular;  Laterality: N/A;   CHOLECYSTECTOMY  1983   COLONOSCOPY WITH PROPOFOL  N/A 07/13/2015   Procedure: COLONOSCOPY WITH PROPOFOL ;  Surgeon: Lunger MARLA Louder, MD;  Location: [facility] ENDOSCOPY;  Service: Endoscopy;  Laterality: N/A;   ESOPHAGECTOMY  08-21-2012   [facility]   w/ gastric pull-through   ESOPHAGOGASTRODUODENOSCOPY (EGD) WITH ESOPHAGEAL DILATION  multiple x32 per pt approx.--- last one 02-09-2015   recurrent anastomotic stricture post esophagectomy   HIATAL HERNIA REPAIR  06/26/2012   Procedure: LAPAROSCOPIC REPAIR OF HIATAL HERNIA;  Surgeon: Redell Faith, DO;  Location: [facility] ORS;  Service: General;;   HUMERUS IM NAIL Right 12/28/2023   Procedure: INSERTION, INTRAMEDULLARY ROD, HUMERUS;  Surgeon: Dozier Soulier, MD;  Location: [facility] ORS;  Service: Orthopedics;  Laterality: Right;   INCISIONAL HERNIA REPAIR  06/26/2012   Procedure: HERNIA REPAIR INCISIONAL;  Surgeon: Redell Faith, DO;  Location: [facility] ORS;  Service: General;;   INGUINAL HERNIA REPAIR Bilateral 1982   JEJUNOSTOMY FEEDING TUBE  12/22/2012   Removed 12/ 2016  approx.   KNEE ARTHROSCOPY Left 1990   KNEE ARTHROSCOPY Right 12/2019   LAPAROSCOPIC GASTRIC SLEEVE RESECTION  06/2012   started but abandoned d/t discovery of tumor   LAPAROTOMY  07/12/2021   revision of previous gastric conduit, hiatal hernia repair   MASS EXCISION Left 04/04/2013   Procedure: EXCISION OF SCALP MASS;  Surgeon: Redell Faith, DO;  Location: [facility] ORS;  Service: General;  Laterality: Left;   pilomatrixoma   NESBIT PROCEDURE N/A 12/09/2016   Procedure: NESBIT PROCEDURE 16 DOT PLICATION;  Surgeon: Mark Ottelin, MD;  Location: [facility];  Service: Urology;  Laterality: N/A;   THORACOTOMY  07/12/2021   revision of previous gastric conduit   TONSILLECTOMY  1959   TRANSTHORACIC ECHOCARDIOGRAM  08-06-2015   [facility]   mild LVH, ef 50%/  mild RAE /  moderate LAE/  trivial AR, MR, PR,  and TR   Family History  Problem Relation Age of  Onset   Hypertension Mother    Heart failure Mother    Prostate cancer Father    Kidney cancer Father    Emphysema Father    Aortic aneurysm Father    Anorexia nervosa Sister    Depression Sister    Anorexia nervosa Sister    Heart disease Sister    Hypertension Brother    Cancer Maternal Grandmother    Heart attack Maternal Grandfather    Heart disease Maternal Grandfather    Cancer Paternal Grandmother    Alcohol abuse Paternal  Grandfather    Early death Paternal Grandfather    Tuberculosis Paternal Grandfather    Colon cancer Neg Hx    Social History   Socioeconomic History   Marital status: Married    Spouse name: Not on file   Number of children: 4   Years of education: Not on file   Highest education level: Bachelor's degree (e.g., BA, AB, BS)  Occupational History   Occupation: retired, Medical illustrator, Designer, fashion/clothing  Tobacco Use   Smoking status: Never    Passive exposure: Past   Smokeless tobacco: Never   Tobacco comments:    Never smoked 01/26/24  Vaping Use   Vaping status: Never Used  Substance and Sexual Activity   Alcohol use: Not Currently    Comment: socially   Drug use: Never   Sexual activity: Not Currently  Other Topics Concern   Not on file  Social History Narrative   Household- lives w/ wife   Social Drivers of Health   Financial Resource Strain: Low Risk  (05/17/2024)   Overall Financial Resource Strain (CARDIA)    Difficulty of Paying Living Expenses: Not very hard  Food Insecurity: No Food Insecurity (05/17/2024)   Hunger Vital Sign    Worried About Running Out of Food in the Last Year: Never true    Ran Out of Food in the Last Year: Never true  Transportation Needs: No Transportation Needs (05/17/2024)   PRAPARE - Administrator, Civil Service (Medical): No    Lack of Transportation (Non-Medical): No  Physical Activity: Sufficiently Active (05/17/2024)   Exercise Vital Sign    Days of Exercise per Week: 5 days    Minutes of Exercise  per Session: 30 min  Stress: Stress Concern Present (05/17/2024)   [employer] of Occupational Health - Occupational Stress Questionnaire    Feeling of Stress: To some extent  Social Connections: Socially Isolated (05/17/2024)   Social Connection and Isolation Panel    Frequency of Communication with Friends and Family: Once a week    Frequency of Social Gatherings with Friends and Family: Once a week    Attends Religious Services: Never    Database administrator or Organizations: No    Attends Banker Meetings: Never    Marital Status: Married    Tobacco Counseling Counseling given: Not Answered Tobacco comments: Never smoked 01/26/24    Clinical Intake:  Pre-visit preparation completed: Yes  Pain : No/denies pain Pain Score: 0-No pain     BMI - recorded: 30.34 Nutritional Status: BMI > 30  Obese Nutritional Risks: None Diabetes: Yes CBG done?: No Did pt. bring in CBG monitor from home?: No  Lab Results  Component Value Date   HGBA1C 7.3 (A) 05/14/2024   HGBA1C 6.9 (A) 02/07/2024   HGBA1C 6.8 (A) 11/07/2023     How often do you need to have someone help you when you read instructions, pamphlets, or other written materials from your doctor or pharmacy?: 1 - Never  Interpreter Needed?: No  Information entered by :: Lolita Libra, CMA(AAMA)   Activities of Daily Living     05/17/2024    9:08 AM 01/31/2024    8:59 AM  In your present state of health, do you have any difficulty performing the following activities:  Hearing? 1 0  Vision? 0 0  Difficulty concentrating or making decisions? 0 0  Walking or climbing stairs? 1   Comment takes time, holds rails   Dressing or bathing? 0  Doing errands, shopping? 0   Preparing Food and eating ? N   Using the Toilet? N   In the past six months, have you accidently leaked urine? N   Do you have problems with loss of bowel control? N   Managing your Medications? N   Managing your Finances? N    Housekeeping or managing your Housekeeping? N     Patient Care Team: Amon Aloysius BRAVO, MD as PCP - General (Internal Medicine) Pietro Redell RAMAN, MD as PCP - Cardiology (Cardiology) Inocencio Soyla Lunger, MD as PCP - Electrophysiology (Cardiology) Myrle Myrtle, MD as Consulting Physician (Psychiatry) Josh Server, MD as Referring Physician (Ophthalmology) Medora Ned, MD as Referring Physician Beuford Anes, MD as Consulting Physician (Orthopedic Surgery) Shelah Lamar RAMAN, MD as Consulting Physician (Pulmonary Disease) Daubert, Lynwood, MD (Internal Medicine) Kennyth Cy RAMAN, DO (Optometry) Billy Rocky Collar, MD as Referring Physician (Endocrinology) Murlean Lang LABOR, MD as Referring Physician (Thoracic Surgery) Zani, Sabino Jr., MD as Consulting Physician (General Surgery) Elicia Claw, MD as Consulting Physician (Gastroenterology)  I have updated your Care Teams any recent Medical Services you may have received from other providers in the past year.     Assessment:   This is a routine wellness examination for Lawrence [name].  Hearing/Vision screen Hearing Screening - Comments:: Notes difficulty in crowds and with background noise. Will look into testing after pacemaker is replaced. Vision Screening - Comments:: Up to date with routine eye exams with Cy Kennyth / [facility]    Goals Addressed   None    Depression Screen     05/17/2024    9:14 AM 11/06/2023    1:57 PM 08/21/2023    9:02 AM 06/23/2023    3:31 PM 04/07/2023    9:53 AM 03/27/2023    8:28 AM  PHQ 2/9 Scores  PHQ - 2 Score 1 2 2 3 3  0  PHQ- 9 Score 14 5 5 7 7      Fall Risk     05/17/2024    9:27 AM 12/26/2023    1:38 PM 11/06/2023    1:57 PM 08/21/2023    8:37 AM 06/23/2023    3:31 PM  Fall Risk   Falls in the past year? 1 1 1 1 1   Number falls in past yr: 1 1 0 0 0  Injury with Fall? 1 1 1 1 1   Risk for fall due to : History of fall(s);Impaired balance/gait      Follow up Falls evaluation  completed Falls evaluation completed;Education provided Falls evaluation completed;Education provided Falls evaluation completed;Education provided Falls evaluation completed;Education provided    MEDICARE RISK AT HOME:  Medicare Risk at Home Any stairs in or around the home?: No If so, are there any without handrails?: No Home free of loose throw rugs in walkways, pet beds, electrical cords, etc?: Yes Adequate lighting in your home to reduce risk of falls?: Yes Life alert?: No Use of a cane, walker or w/c?: Yes (cane / walker intermittently) Grab bars in the bathroom?: No Shower chair or bench in shower?: No Elevated toilet seat or a handicapped toilet?: Yes  TIMED UP AND GO:  Was the test performed?  No audio  Cognitive Function: 6CIT completed        03/27/2023    8:33 AM  6CIT Screen  What Year? 0 points  What month? 0 points  What time? 0 points  Count back from 20 0 points  Months in reverse 0 points  Repeat phrase  0 points  Total Score 0 points    Immunizations Immunization History  Administered Date(s) Administered   Influenza Split 06/06/2023   Influenza-Unspecified 07/02/2014, 06/08/2015, 05/07/2016, 06/08/2017, 06/06/2018   PFIZER Comirnaty(Gray Top)Covid-19 Tri-Sucrose Vaccine 12/12/2020   PFIZER(Purple Top)SARS-COV-2 Vaccination 10/20/2019, 11/11/2019, 06/01/2020   PNEUMOCOCCAL CONJUGATE-20 04/07/2023   Pfizer Covid-19 Vaccine Bivalent Booster 68yrs & up 06/09/2021, 04/27/2022   Pfizer(Comirnaty)Fall Seasonal Vaccine 12 years and older 06/29/2023   Pneumococcal Conjugate-13 02/21/2017   Pneumococcal Polysaccharide-23 02/23/2018   Respiratory Syncytial Virus Vaccine,Recomb Aduvanted(Arexvy) 06/29/2023   Tdap 03/31/2017   Zoster Recombinant(Shingrix) 04/12/2018, 08/05/2018   Zoster, Live 12/13/2011    Screening Tests Health Maintenance  Topic Date Due   Influenza Vaccine  04/05/2024   COVID-19 Vaccine (8 - Pfizer risk 2024-25 season) 05/06/2024    Colonoscopy  05/17/2024   FOOT EXAM  08/07/2024   OPHTHALMOLOGY EXAM  09/11/2024   HEMOGLOBIN A1C  11/11/2024   Diabetic kidney evaluation - eGFR measurement  04/08/2025   Diabetic kidney evaluation - Urine ACR  05/14/2025   Medicare Annual Wellness (AWV)  05/17/2025   DTaP/Tdap/Td (2 - Td or Tdap) 04/01/2027   Pneumococcal Vaccine: 50+ Years  Completed   Hepatitis C Screening  Completed   Zoster Vaccines- Shingrix  Completed   HPV VACCINES  Aged Out   Meningococcal B Vaccine  Aged Out    Health Maintenance Items Addressed: Will get flu vaccine via new study in [place]. Will get colonoscopy after clearance from cardiology. COVID vaccine RX sent. Will get foot exam from endo in December.  Additional Screening:  Vision Screening: Recommended annual ophthalmology exams for early detection of glaucoma and other disorders of the eye. Is the patient up to date with their annual eye exam?  Yes  Who is the provider or what is the name of the office in which the patient attends annual eye exams? [facility] / [facility]  Dental Screening: Recommended annual dental exams for proper oral hygiene  Community Resource Referral / Chronic Care Management: CRR required this visit?  No   CCM required this visit?  No   Plan:    I have personally reviewed and noted the following in the patient's chart:   Medical and social history Use of alcohol, tobacco or illicit drugs  Current medications and supplements including opioid prescriptions. Patient is not currently taking opioid prescriptions. Functional ability and status Nutritional status Physical activity Advanced directives List of other physicians Hospitalizations, surgeries, and ER visits in previous 12 months Vitals Screenings to include cognitive, depression, and falls Referrals and appointments  In addition, I have reviewed and discussed with patient certain preventive protocols, quality metrics, and best practice  recommendations. A written personalized care plan for preventive services as well as general preventive health recommendations were provided to patient.   Lolita Libra, CMA   05/17/2024   After Visit Summary: (MyChart) Due to this being a telephonic visit, the after visit summary with patients personalized plan was offered to patient via MyChart   Notes: see phone note

## 2024-05-17 NOTE — Telephone Encounter (Signed)
 Pt had AWV today.  He reports having macular degeneration and medical history has been updated.  He has enrolled in a study for a new type of flu vaccine and will be getting this then. He will update us  with date as soon as completed.  He scored 14 on PHQ-9 today.  He reports anxiety and depression surrounding his and children's health etc. He states that he is still able to function and do what is needed. He doesn't feel he needs therapy at this time and will let us  know if symptoms worsen or he changes his mind.

## 2024-05-18 NOTE — Progress Notes (Signed)
 Remote PPM Transmission

## 2024-05-21 DIAGNOSIS — H2513 Age-related nuclear cataract, bilateral: Secondary | ICD-10-CM | POA: Diagnosis not present

## 2024-05-21 DIAGNOSIS — H43823 Vitreomacular adhesion, bilateral: Secondary | ICD-10-CM | POA: Diagnosis not present

## 2024-05-21 DIAGNOSIS — H35033 Hypertensive retinopathy, bilateral: Secondary | ICD-10-CM | POA: Diagnosis not present

## 2024-05-21 DIAGNOSIS — H353132 Nonexudative age-related macular degeneration, bilateral, intermediate dry stage: Secondary | ICD-10-CM | POA: Diagnosis not present

## 2024-05-21 DIAGNOSIS — E113293 Type 2 diabetes mellitus with mild nonproliferative diabetic retinopathy without macular edema, bilateral: Secondary | ICD-10-CM | POA: Diagnosis not present

## 2024-05-21 LAB — HM DIABETES EYE EXAM

## 2024-05-21 NOTE — Telephone Encounter (Signed)
 Thank you :)

## 2024-05-27 ENCOUNTER — Encounter: Payer: Self-pay | Admitting: Internal Medicine

## 2024-05-28 ENCOUNTER — Encounter: Payer: Self-pay | Admitting: Internal Medicine

## 2024-05-28 ENCOUNTER — Ambulatory Visit: Admitting: Internal Medicine

## 2024-05-28 VITALS — BP 132/80 | HR 73 | Temp 97.8°F | Resp 18 | Ht 73.0 in | Wt 231.0 lb

## 2024-05-28 DIAGNOSIS — M542 Cervicalgia: Secondary | ICD-10-CM | POA: Diagnosis not present

## 2024-05-28 DIAGNOSIS — G8929 Other chronic pain: Secondary | ICD-10-CM

## 2024-05-28 DIAGNOSIS — E113219 Type 2 diabetes mellitus with mild nonproliferative diabetic retinopathy with macular edema, unspecified eye: Secondary | ICD-10-CM

## 2024-05-28 DIAGNOSIS — E785 Hyperlipidemia, unspecified: Secondary | ICD-10-CM | POA: Diagnosis not present

## 2024-05-28 DIAGNOSIS — Z794 Long term (current) use of insulin: Secondary | ICD-10-CM | POA: Diagnosis not present

## 2024-05-28 DIAGNOSIS — F1020 Alcohol dependence, uncomplicated: Secondary | ICD-10-CM | POA: Diagnosis not present

## 2024-05-28 DIAGNOSIS — N400 Enlarged prostate without lower urinary tract symptoms: Secondary | ICD-10-CM | POA: Diagnosis not present

## 2024-05-28 DIAGNOSIS — F419 Anxiety disorder, unspecified: Secondary | ICD-10-CM

## 2024-05-28 DIAGNOSIS — I480 Paroxysmal atrial fibrillation: Secondary | ICD-10-CM

## 2024-05-28 DIAGNOSIS — I951 Orthostatic hypotension: Secondary | ICD-10-CM | POA: Diagnosis not present

## 2024-05-28 LAB — LIPID PANEL
Cholesterol: 120 mg/dL (ref 0–200)
HDL: 52.9 mg/dL (ref >39.00)
LDL Cholesterol: 44 mg/dL (ref 0–99)
NonHDL: 67.29
Total CHOL/HDL Ratio: 2
Triglycerides: 115 mg/dL (ref 0.0–149.0)
VLDL: 23 mg/dL (ref 0.0–40.0)

## 2024-05-28 LAB — MICROALBUMIN / CREATININE URINE RATIO
Creatinine,U: 220.4 mg/dL
Microalb Creat Ratio: 218.3 mg/g — ABNORMAL HIGH (ref 0.0–30.0)
Microalb, Ur: 48.1 mg/dL — ABNORMAL HIGH (ref 0.0–1.9)

## 2024-05-28 LAB — PSA: PSA: 2.69 ng/mL (ref 0.10–4.00)

## 2024-05-28 NOTE — Patient Instructions (Addendum)
 Proceed with a flu shot and a COVID booster this fall  Check your blood pressures regularly Blood pressure goal:  between 110/65 and  135/85. If it is consistently higher or lower, let me know     GO TO THE LAB :  Get the blood work   Your results will be posted on MyChart with my comments  Go to the front desk for the checkout Please make an appointment for a sickle exam in 4 months

## 2024-05-28 NOTE — Assessment & Plan Note (Signed)
 DM with retinopathy: Per Endo. Foot exam: Consistent with neuropathy, he follows good feet care practices Check micro Orthostasis: Per cardiology, felt to be due to autonomic dysfunction 2/2 DM.  Recommend to allow BP to be slightly elevated and continue Florinef . HTN: BP today looks good, continue metoprolol . High cholesterol: On Crestor , last LFTs normal, check FLP Alcohol abuse: Reports he drinks a glass of wine during the weekends only.  Recommend complete abstinence. Anxiety: PHQ-9: 13, GAD: 14.  Despite the  results, he reports he is feeling well on fluoxetine , on Atarax  at bedtime. MSK, chronic neck pain: See LOV, since then has stopped all narcotics. Praised  Paroxysmal A-fib: had an ablation 05/01/2024 ; cont on  Eliquis  Preventive care: Plans to get a flu shot in the context of study.  Recommend COVID booster.  Check PSA (history of BPH). RTC 4 months

## 2024-05-28 NOTE — Progress Notes (Signed)
 Subjective:    Patient ID: Lawrence JONETTA Arnulfo Mickey., male    DOB: 06/22/1951, 72 y.o.   MRN: [id number]  DOS:  05/28/2024 Type of visit - description: Follow-up  Since the last office visit is feeling well. Has no major concerns. Good med compliance Denies chest pain or difficulty breathing.  Occasional palpitations without associated symptoms, palpitations are short-lived. Anticoagulated-- denies blood in the stools or in the urine.  No nausea or vomiting.   Review of Systems See above   Past Medical History:  Diagnosis Date   Anxiety 01/20/2023   Bradycardia    Cancer (HCC) 2013   esophageal cancer   Depression    Dysrhythmia    Afib   GERD (gastroesophageal reflux disease)    H/O hiatal hernia    s/p  repair 06-26-2012   History of esophagectomy 08-21-2012  AT [facility]   History of kidney stones 2006   History of malignant neoplasm of esophagus ONCOLOGIST-  DR D'AMICO AT [facility]-- PER LAST NOTE 01/ 2018  NO RECURRENCE   dx 10/ 2013  Stage 2A (T2 N0)  08-21-2012 s/p  esophagectomy w/ gastric pull-through [facility]) post residual recurrent anastomotic stricture (multiple EGD w/ dilations)   History of transient ischemic attack (TIA) 2003   no residual   Hypertension    Long term (current) use of anticoagulants    xarelto    Macular degeneration    Neuromuscular disorder (HCC)    neuropathy of feet   On dofetilide  therapy    PAF (paroxysmal atrial fibrillation) (HCC) first dx 2009   primary cardiologist-  dr victory sharps /  EP cardiologist -- dr melchor ([facility])  s/p  afib ablation 08-02-2016   Pancreatic cyst    x2    Peyronie's disease    Presence of permanent cardiac pacemaker implanted 05-01-2014  at [facility]   Dr.Daubert -[facility] follows   Pulmonary nodule, right    upper and middle lobes --  per last CT stable   Type 2 diabetes mellitus treated with insulin  (HCC)    Wears glasses     Past Surgical History:  Procedure Laterality Date   ANTERIOR CERVICAL  DECOMP/DISCECTOMY FUSION N/A 06/02/2016   Procedure: ANTERIOR CERVICAL DECOMPRESSION FUSION CERVICAL 5-6, CERVICAL 6-7 WITH INSTRUMENTATION AND ALLOGRAFT;  Surgeon: Oneil Priestly, MD;  Location: [facility] OR;  Service: Orthopedics;  Laterality: N/A;  ANTERIOR CERVICAL DECOMPRESSION FUSION CERVICAL 5-6, CERVICAL 6-7 WITH INSTRUMENTATION AND ALLOGRAFT   ATRIAL FIBRILLATION ABLATION N/A 05/01/2024   Procedure: ATRIAL FIBRILLATION ABLATION;  Surgeon: Inocencio Soyla Lunger, MD;  Location: [facility] INVASIVE CV LAB;  Service: Cardiovascular;  Laterality: N/A;   BIOPSY  06/26/2012   Procedure: BIOPSY;  Surgeon: Redell Faith, DO;  Location: [facility] ORS;  Service: General;;   CARDIAC ELECTROPHYSIOLOGY STUDY AND ABLATION  08-02-2016   [facility]   (dr melchor)   pulmonary veins isolation and ablation afib   CARDIAC PACEMAKER PLACEMENT  05-01-2014   [facility]   CARDIOVERSION N/A 01/31/2024   Procedure: CARDIOVERSION;  Surgeon: Lonni Slain, MD;  Location: [facility] INVASIVE CV LAB;  Service: Cardiovascular;  Laterality: N/A;   CHOLECYSTECTOMY  1983   COLONOSCOPY WITH PROPOFOL  N/A 07/13/2015   Procedure: COLONOSCOPY WITH PROPOFOL ;  Surgeon: Lunger MARLA Louder, MD;  Location: [facility] ENDOSCOPY;  Service: Endoscopy;  Laterality: N/A;   ESOPHAGECTOMY  08-21-2012   [facility]   w/ gastric pull-through   ESOPHAGOGASTRODUODENOSCOPY (EGD) WITH ESOPHAGEAL DILATION  multiple x32 per pt approx.--- last one 02-09-2015   recurrent anastomotic stricture post  esophagectomy   HIATAL HERNIA REPAIR  06/26/2012   Procedure: LAPAROSCOPIC REPAIR OF HIATAL HERNIA;  Surgeon: Redell Faith, DO;  Location: [facility] ORS;  Service: General;;   HUMERUS IM NAIL Right 12/28/2023   Procedure: INSERTION, INTRAMEDULLARY ROD, HUMERUS;  Surgeon: Dozier Soulier, MD;  Location: [facility] ORS;  Service: Orthopedics;  Laterality: Right;   INCISIONAL HERNIA REPAIR  06/26/2012   Procedure: HERNIA REPAIR INCISIONAL;  Surgeon: Redell Faith, DO;  Location: [facility] ORS;  Service: General;;   INGUINAL  HERNIA REPAIR Bilateral 1982   JEJUNOSTOMY FEEDING TUBE  12/22/2012   Removed 12/ 2016  approx.   KNEE ARTHROSCOPY Left 1990   KNEE ARTHROSCOPY Right 12/2019   LAPAROSCOPIC GASTRIC SLEEVE RESECTION  06/2012   started but abandoned d/t discovery of tumor   LAPAROTOMY  07/12/2021   revision of previous gastric conduit, hiatal hernia repair   MASS EXCISION Left 04/04/2013   Procedure: EXCISION OF SCALP MASS;  Surgeon: Redell Faith, DO;  Location: [facility] ORS;  Service: General;  Laterality: Left;   pilomatrixoma   NESBIT PROCEDURE N/A 12/09/2016   Procedure: NESBIT PROCEDURE 16 DOT PLICATION;  Surgeon: Mark Ottelin, MD;  Location: [facility];  Service: Urology;  Laterality: N/A;   THORACOTOMY  07/12/2021   revision of previous gastric conduit   TONSILLECTOMY  1959   TRANSTHORACIC ECHOCARDIOGRAM  08-06-2015   [facility]   mild LVH, ef 50%/  mild RAE /  moderate LAE/  trivial AR, MR, PR,  and TR    Current Outpatient Medications  Medication Instructions   acetaminophen  (TYLENOL ) 500-1,000 mg, Every 6 hours PRN   Alcohol Swabs (CVS PREP) 70 % PADS See admin instructions   apixaban  (ELIQUIS ) 5 mg, Oral, 2 times daily   Continuous Glucose Sensor (FREESTYLE LIBRE 2 PLUS SENSOR) MISC 1 each, Subcutaneous, As directed, Change sensor every 15 days   cyanocobalamin  (VITAMIN B12) 1,000 mcg, Every evening   ferrous sulfate 325 mg, Every evening   fludrocortisone  (FLORINEF ) 0.1 mg, Oral, Daily   FLUoxetine  (PROZAC ) 60 mg, Oral, Daily   HUMULIN  70/30 (70-30) 100 UNIT/ML injection 22 UNITS IN THE MORNING WITH BREAKFAST AND 6 UNITS IN THE EVENING WITH SUPPER.   hydrOXYzine  (ATARAX ) 25 mg, Oral, 2 times daily PRN   Insulin  Syringe-Needle U-100 (BD VEO INSULIN  SYRINGE U/F) 31G X 15/64 0.3 ML MISC Use as directed in the morning, at noon, in the evening, and at bedtime.   metoprolol  tartrate (LOPRESSOR ) 25 mg, Oral, 2 times daily   Multiple Vitamins-Minerals (PRESERVISION AREDS PO) 1 tablet, 2 times  daily   rosuvastatin  (CRESTOR ) 40 mg, Oral, Daily   tiZANidine  (ZANAFLEX ) 4 mg, Oral, Every 8 hours PRN       Objective:   Physical Exam BP 132/80   Pulse 73   Temp 97.8 F (36.6 C) (Oral)   Resp 18   Ht 6' 1 (1.854 m)   Wt 231 lb (104.8 kg)   SpO2 96%   BMI 30.48 kg/m  General:   Well developed, NAD, BMI noted. HEENT:  Normocephalic . Face symmetric, atraumatic Lungs:  CTA B Normal respiratory effort, no intercostal retractions, no accessory muscle use. Heart: RRR,  no murmur.  DM foot exam: Decreased sensitivity distally more noticeable on the right Skin: Not pale. Not jaundice Neurologic:  alert & oriented X3.  Speech normal, gait appropriate for age and unassisted Psych--  Cognition and judgment appear intact.  Cooperative with normal attention span and concentration.  Behavior appropriate. No anxious or depressed  appearing.      Assessment      Problem list: DM- per endo Mild nonproliferative diabetic retinopathy with macular edema. HTN High cholesterol Anxiety  GI: Esophageal neoplasm 2013 found during a gastric bypass  Hiatal hernia: R thoracotomy Laparotomy lysis of adhesions for repair of paraesophageal hernia in 2022  Cardiovascular: A-fib dx 2014, anticoagulated Pacemaker 2015, Ablation @ [facility] 2017 TIA 2003 ?  MRI negative, MRA some atherosclerosis distal R vertebral artery? Orthostatic hypotension CAD: Moderate-per CT coronary angiogram 01/2023, medical mngmt MSK: see surgeries, chronic neck pain on tramadol  EtOH abuse (see OV 12/26/2023)  PLAN DM with retinopathy: Per Endo. Foot exam: Consistent with neuropathy, he follows good feet care practices Check micro Orthostasis: Per cardiology, felt to be due to autonomic dysfunction 2/2 DM.  Recommend to allow BP to be slightly elevated and continue Florinef . HTN: BP today looks good, continue metoprolol . High cholesterol: On Crestor , last LFTs normal, check FLP Alcohol abuse: Reports he drinks a  glass of wine during the weekends only.  Recommend complete abstinence. Anxiety: PHQ-9: 13, GAD: 14.  Despite the  results, he reports he is feeling well on fluoxetine , on Atarax  at bedtime. MSK, chronic neck pain: See LOV, since then has stopped all narcotics. Praised  Paroxysmal A-fib: had an ablation 05/01/2024 ; cont on  Eliquis  Preventive care: Plans to get a flu shot in the context of study.  Recommend COVID booster.  Check PSA (history of BPH). RTC 4 months

## 2024-05-29 ENCOUNTER — Ambulatory Visit: Payer: Self-pay | Admitting: Internal Medicine

## 2024-05-29 DIAGNOSIS — R809 Proteinuria, unspecified: Secondary | ICD-10-CM

## 2024-05-29 DIAGNOSIS — E1142 Type 2 diabetes mellitus with diabetic polyneuropathy: Secondary | ICD-10-CM

## 2024-05-30 ENCOUNTER — Encounter

## 2024-05-31 ENCOUNTER — Ambulatory Visit (HOSPITAL_COMMUNITY)
Admission: RE | Admit: 2024-05-31 | Discharge: 2024-05-31 | Disposition: A | Discharge status: Home / Self Care | Source: Ambulatory Visit | Attending: Physician Assistant | Admitting: Physician Assistant

## 2024-05-31 VITALS — BP 150/90 | HR 73 | Ht 73.0 in | Wt 234.0 lb

## 2024-05-31 DIAGNOSIS — D6869 Other thrombophilia: Secondary | ICD-10-CM

## 2024-05-31 DIAGNOSIS — I4891 Unspecified atrial fibrillation: Secondary | ICD-10-CM

## 2024-05-31 DIAGNOSIS — I4819 Other persistent atrial fibrillation: Secondary | ICD-10-CM | POA: Diagnosis not present

## 2024-05-31 NOTE — Progress Notes (Signed)
 Primary Care Physician: Amon Aloysius BRAVO, MD Primary Cardiologist: Redell Shallow, MD Electrophysiologist: Will Gladis Norton, MD  Referring Physician: Dr Norton Marcey JONETTA Arnulfo Mickey. is a 73 y.o. male with a history of CAD, symptomatic bradycardia s/p PPM, TIA, HTN, orthostatic hypotension, HLD, DM, atrial fibrillation who presents for follow up in the [facility].  He had a pacemaker inserted in 2015. He had a A-fib ablation in 2017 at [facility]. Coronary CT in 2024 showed an elevated calcium  score of 1025 with FFR showing no obstructive disease. Echo at the time showed ejection fraction of 55 to 60%. On 12/28/2023 patient underwent intramedullary nail to the right humerus following a proximal humerus shaft fracture.     His device has shown persistent afib since 08/2023. Patient is on Eliquis  for stroke prevention. He is s/p DCCV on 01/31/24. He also underwent afib ablation with Dr Norton on 05/01/24.  Patient returns for follow up for atrial fibrillation. He reports that he has done well since his ablation. He has had some very brief palpitations lasting only a few seconds. He denies groin issues.   Today, he  denies symptoms of chest pain, shortness of breath, orthopnea, PND, lower extremity edema, dizziness, presyncope, syncope, snoring, daytime somnolence, bleeding, or neurologic sequela. The patient is tolerating medications without difficulties and is otherwise without complaint today.    Atrial Fibrillation Risk Factors:  he does not have symptoms or diagnosis of sleep apnea. he does not have a history of rheumatic fever. he does have a history of alcohol use.   Atrial Fibrillation Management history:  Previous antiarrhythmic drugs: none Previous cardioversions: remotely, 01/31/24 Previous ablations: 2017 at [facility], 05/01/24 Anticoagulation history: Eliquis   ROS- All systems are reviewed and negative except as per the HPI above.  Past Medical History:   Diagnosis Date   Anxiety 01/20/2023   Bradycardia    Cancer (HCC) 2013   esophageal cancer   Depression    Dysrhythmia    Afib   GERD (gastroesophageal reflux disease)    H/O hiatal hernia    s/p  repair 06-26-2012   History of esophagectomy 08-21-2012  AT [facility]   History of kidney stones 2006   History of malignant neoplasm of esophagus ONCOLOGIST-  DR D'AMICO AT [facility]-- PER LAST NOTE 01/ 2018  NO RECURRENCE   dx 10/ 2013  Stage 2A (T2 N0)  08-21-2012 s/p  esophagectomy w/ gastric pull-through [facility]) post residual recurrent anastomotic stricture (multiple EGD w/ dilations)   History of transient ischemic attack (TIA) 2003   no residual   Hypertension    Long term (current) use of anticoagulants    xarelto    Macular degeneration    Neuromuscular disorder (HCC)    neuropathy of feet   On dofetilide  therapy    PAF (paroxysmal atrial fibrillation) (HCC) first dx 2009   primary cardiologist-  dr victory sharps /  EP cardiologist -- dr melchor ([facility])  s/p  afib ablation 08-02-2016   Pancreatic cyst    x2    Peyronie's disease    Presence of permanent cardiac pacemaker implanted 05-01-2014  at [facility]   Dr.Daubert -[facility] follows   Pulmonary nodule, right    upper and middle lobes --  per last CT stable   Type 2 diabetes mellitus treated with insulin  (HCC)    Wears glasses     Current Outpatient Medications  Medication Sig Dispense Refill   acetaminophen  (TYLENOL ) 500 MG tablet Take  500-1,000 mg by mouth every 6 (six) hours as needed (pain.). (Patient taking differently: Take 500-1,000 mg by mouth as needed (pain.).)     Alcohol Swabs (CVS PREP) 70 % PADS USE AS DIRECTED 200 each 2   apixaban  (ELIQUIS ) 5 MG TABS tablet Take 1 tablet (5 mg total) by mouth 2 (two) times daily. 180 tablet 1   Continuous Glucose Sensor (FREESTYLE LIBRE 2 PLUS SENSOR) MISC Inject 1 each into the skin as directed. Change sensor every 15 days 6 each 3   cyanocobalamin  (VITAMIN B12) 1000 MCG  tablet Take 1,000 mcg by mouth every evening.     ferrous sulfate 325 (65 FE) MG tablet Take 325 mg by mouth every evening.     fludrocortisone  (FLORINEF ) 0.1 MG tablet Take 1 tablet (0.1 mg total) by mouth daily. 90 tablet 3   FLUoxetine  (PROZAC ) 20 MG tablet Take 3 tablets (60 mg total) by mouth daily. 270 tablet 1   HUMULIN  70/30 (70-30) 100 UNIT/ML injection 22 UNITS IN THE MORNING WITH BREAKFAST AND 6 UNITS IN THE EVENING WITH SUPPER. 20 mL 4   hydrOXYzine  (ATARAX ) 25 MG tablet Take 1 tablet (25 mg total) by mouth 2 (two) times daily as needed for anxiety (Insomnia). 40 tablet 1   Insulin  Syringe-Needle U-100 (BD VEO INSULIN  SYRINGE U/F) 31G X 15/64 0.3 ML MISC Use as directed in the morning, at noon, in the evening, and at bedtime. 300 each 3   metoprolol  tartrate (LOPRESSOR ) 25 MG tablet Take 1 tablet (25 mg total) by mouth 2 (two) times daily. 180 tablet 2   Multiple Vitamins-Minerals (PRESERVISION AREDS PO) Take 1 tablet by mouth in the morning and at bedtime. Preservision AREDS     rosuvastatin  (CRESTOR ) 40 MG tablet Take 1 tablet (40 mg total) by mouth daily. 90 tablet 1   tiZANidine  (ZANAFLEX ) 4 MG tablet Take 1 tablet (4 mg total) by mouth every 8 (eight) hours as needed. (Patient taking differently: Take 4 mg by mouth every morning.) 30 tablet 0   No current facility-administered medications for this encounter.    Physical Exam: BP (!) 150/90   Pulse 73   Ht 6' 1 (1.854 m)   Wt 106.1 kg   BMI 30.87 kg/m   GEN: Well nourished, well developed in no acute distress CARDIAC: Regular rate and rhythm, no murmurs, rubs, gallops RESPIRATORY:  Clear to auscultation without rales, wheezing or rhonchi  ABDOMEN: Soft, non-tender, non-distended EXTREMITIES:  No edema; No deformity    Wt Readings from Last 3 Encounters:  05/31/24 106.1 kg  05/28/24 104.8 kg  05/17/24 104.3 kg     EKG today demonstrates  A paced rhythm Vent. rate 73 BPM PR interval 216 ms QRS duration 84  ms QT/QTcB 398/438 ms   Echo 01/18/23 demonstrated   1. Left ventricular ejection fraction, by estimation, is 55 to 60%. The  left ventricle has normal function. The left ventricle has no regional  wall motion abnormalities. There is mild left ventricular hypertrophy.  Left ventricular diastolic parameters are consistent with Grade II diastolic dysfunction (pseudonormalization). The average left ventricular global longitudinal strain is -11.3 %. The global longitudinal strain is abnormal.   2. Right ventricular systolic function is normal. The right ventricular  size is normal. There is normal pulmonary artery systolic pressure.   3. The mitral valve is normal in structure. No evidence of mitral valve  regurgitation. No evidence of mitral stenosis.   4. The aortic valve is tricuspid. There is mild  calcification of the  aortic valve. There is mild thickening of the aortic valve. Aortic valve  regurgitation is not visualized. Mild aortic valve stenosis. Aortic valve  mean gradient measures 9.0 mmHg.   5. The inferior vena cava is normal in size with greater than 50%  respiratory variability, suggesting right atrial pressure of 3 mmHg.    CHA2DS2-VASc Score = 6  The patient's score is based upon: CHF History: 0 HTN History: 1 Diabetes History: 1 Stroke History: 2 Vascular Disease History: 1 Age Score: 1 Gender Score: 0       ASSESSMENT AND PLAN: Persistent Atrial Fibrillation (ICD10:  I48.19) The patient's CHA2DS2-VASc score is 6, indicating a 9.7% annual risk of stroke.   S/p afib ablation at [facility] 2017, repeat ablation 05/01/24 Patient appears to be maintaining SR Continue Eliquis  5 mg BID with no missed doses for 3 months post ablation.  Continue Lopressor  25 mg BID  Secondary Hypercoagulable State (ICD10:  D68.69) The patient is at significant risk for stroke/thromboembolism based upon his CHA2DS2-VASc Score of 6.  Continue Apixaban  (Eliquis ). Patient may be interested in  Watchman in the future but has other procedures that need to be completed first.   Symptomatic bradycardia S/p PPM, followed by Dr Inocencio Last device interrogation shows PPM has 4 months battery life. Will have him f/u with EP APP at 3 month post ablation visit to discuss.   CAD No anginal symptoms Followed by Dr Pietro   Follow up with EP APP in 2 months.     Daril Kicks PA-C [facility] [address] [place], [place] [address] [phone]

## 2024-06-10 ENCOUNTER — Ambulatory Visit

## 2024-06-12 LAB — CUP PACEART REMOTE DEVICE CHECK
Battery Remaining Longevity: 3 mo
Battery Voltage: 2.85 V
Brady Statistic AP VP Percent: 0.11 %
Brady Statistic AP VS Percent: 96.44 %
Brady Statistic AS VP Percent: 0 %
Brady Statistic AS VS Percent: 3.45 %
Brady Statistic RA Percent Paced: 95.97 %
Brady Statistic RV Percent Paced: 0.15 %
Date Time Interrogation Session: 20251006110045
Implantable Lead Connection Status: 753985
Implantable Lead Connection Status: 753985
Implantable Lead Implant Date: 20150827
Implantable Lead Implant Date: 20150827
Implantable Lead Location: 753859
Implantable Lead Location: 753860
Implantable Lead Model: 5076
Implantable Lead Model: 5076
Implantable Pulse Generator Implant Date: 20150827
Lead Channel Impedance Value: 323 Ohm
Lead Channel Impedance Value: 342 Ohm
Lead Channel Impedance Value: 342 Ohm
Lead Channel Impedance Value: 399 Ohm
Lead Channel Pacing Threshold Amplitude: 0.625 V
Lead Channel Pacing Threshold Amplitude: 1 V
Lead Channel Pacing Threshold Pulse Width: 0.4 ms
Lead Channel Pacing Threshold Pulse Width: 0.4 ms
Lead Channel Sensing Intrinsic Amplitude: 12.125 mV
Lead Channel Sensing Intrinsic Amplitude: 12.125 mV
Lead Channel Sensing Intrinsic Amplitude: 2.625 mV
Lead Channel Sensing Intrinsic Amplitude: 2.625 mV
Lead Channel Setting Pacing Amplitude: 1.5 V
Lead Channel Setting Pacing Amplitude: 2 V
Lead Channel Setting Pacing Pulse Width: 0.4 ms
Lead Channel Setting Sensing Sensitivity: 0.9 mV
Zone Setting Status: 755011
Zone Setting Status: 755011

## 2024-06-13 ENCOUNTER — Other Ambulatory Visit: Payer: Self-pay | Admitting: Internal Medicine

## 2024-06-25 ENCOUNTER — Other Ambulatory Visit (HOSPITAL_BASED_OUTPATIENT_CLINIC_OR_DEPARTMENT_OTHER): Payer: Self-pay

## 2024-06-26 ENCOUNTER — Other Ambulatory Visit (HOSPITAL_BASED_OUTPATIENT_CLINIC_OR_DEPARTMENT_OTHER): Payer: Self-pay

## 2024-06-26 MED ORDER — INSULIN SYRINGE-NEEDLE U-100 31G X 5/16" 0.3 ML MISC
0 refills | Status: DC
  Filled 2024-06-26: qty 300, 75d supply, fill #0

## 2024-07-01 ENCOUNTER — Other Ambulatory Visit (HOSPITAL_BASED_OUTPATIENT_CLINIC_OR_DEPARTMENT_OTHER): Payer: Self-pay

## 2024-07-02 ENCOUNTER — Other Ambulatory Visit (HOSPITAL_BASED_OUTPATIENT_CLINIC_OR_DEPARTMENT_OTHER): Payer: Self-pay

## 2024-07-02 DIAGNOSIS — F1090 Alcohol use, unspecified, uncomplicated: Secondary | ICD-10-CM | POA: Diagnosis not present

## 2024-07-02 DIAGNOSIS — E1129 Type 2 diabetes mellitus with other diabetic kidney complication: Secondary | ICD-10-CM | POA: Diagnosis not present

## 2024-07-02 DIAGNOSIS — R809 Proteinuria, unspecified: Secondary | ICD-10-CM | POA: Diagnosis not present

## 2024-07-02 DIAGNOSIS — I951 Orthostatic hypotension: Secondary | ICD-10-CM | POA: Diagnosis not present

## 2024-07-02 LAB — COMPREHENSIVE METABOLIC PANEL WITH GFR
Albumin: 4.2 (ref 3.5–5.0)
Calcium: 9.6 (ref 8.7–10.7)
eGFR: 70

## 2024-07-02 LAB — BASIC METABOLIC PANEL WITH GFR
BUN: 14 (ref 4–21)
CO2: 30 — AB (ref 13–22)
Chloride: 100 (ref 99–108)
Creatinine: 1.1 (ref 0.6–1.3)
Glucose: 185
Potassium: 4.7 meq/L (ref 3.5–5.1)
Sodium: 139 (ref 137–147)

## 2024-07-03 ENCOUNTER — Encounter: Payer: Self-pay | Admitting: Internal Medicine

## 2024-07-03 NOTE — Progress Notes (Signed)
 Remote pacemaker transmission.

## 2024-07-03 NOTE — Addendum Note (Signed)
 Addended by: VICCI SELLER A on: 07/03/2024 01:09 PM   Modules accepted: Orders, Level of Service

## 2024-07-03 NOTE — Addendum Note (Signed)
 Addended by: VICCI SELLER A on: 07/03/2024 09:22 AM   Modules accepted: Orders, Level of Service

## 2024-07-04 ENCOUNTER — Other Ambulatory Visit (HOSPITAL_BASED_OUTPATIENT_CLINIC_OR_DEPARTMENT_OTHER): Payer: Self-pay

## 2024-07-05 ENCOUNTER — Other Ambulatory Visit (HOSPITAL_BASED_OUTPATIENT_CLINIC_OR_DEPARTMENT_OTHER): Payer: Self-pay

## 2024-07-11 ENCOUNTER — Ambulatory Visit: Attending: Internal Medicine

## 2024-07-11 ENCOUNTER — Encounter

## 2024-07-16 LAB — CUP PACEART REMOTE DEVICE CHECK
Battery Remaining Longevity: 2 mo
Battery Voltage: 2.84 V
Brady Statistic AP VP Percent: 0.1 %
Brady Statistic AP VS Percent: 97.51 %
Brady Statistic AS VP Percent: 0 %
Brady Statistic AS VS Percent: 2.39 %
Brady Statistic RA Percent Paced: 97.07 %
Brady Statistic RV Percent Paced: 0.13 %
Date Time Interrogation Session: 20251111104038
Implantable Lead Connection Status: 753985
Implantable Lead Connection Status: 753985
Implantable Lead Implant Date: 20150827
Implantable Lead Implant Date: 20150827
Implantable Lead Location: 753859
Implantable Lead Location: 753860
Implantable Lead Model: 5076
Implantable Lead Model: 5076
Implantable Pulse Generator Implant Date: 20150827
Lead Channel Impedance Value: 323 Ohm
Lead Channel Impedance Value: 342 Ohm
Lead Channel Impedance Value: 361 Ohm
Lead Channel Impedance Value: 437 Ohm
Lead Channel Pacing Threshold Amplitude: 0.625 V
Lead Channel Pacing Threshold Amplitude: 0.75 V
Lead Channel Pacing Threshold Pulse Width: 0.4 ms
Lead Channel Pacing Threshold Pulse Width: 0.4 ms
Lead Channel Sensing Intrinsic Amplitude: 13.125 mV
Lead Channel Sensing Intrinsic Amplitude: 13.125 mV
Lead Channel Sensing Intrinsic Amplitude: 2.625 mV
Lead Channel Sensing Intrinsic Amplitude: 2.625 mV
Lead Channel Setting Pacing Amplitude: 1.5 V
Lead Channel Setting Pacing Amplitude: 2 V
Lead Channel Setting Pacing Pulse Width: 0.4 ms
Lead Channel Setting Sensing Sensitivity: 0.9 mV
Zone Setting Status: 755011
Zone Setting Status: 755011

## 2024-07-17 ENCOUNTER — Other Ambulatory Visit: Payer: Self-pay | Admitting: Cardiology

## 2024-07-17 DIAGNOSIS — I480 Paroxysmal atrial fibrillation: Secondary | ICD-10-CM

## 2024-07-17 NOTE — Telephone Encounter (Signed)
 Prescription refill request for Eliquis  received. Indication: A-fib Last office visit: 04/01/2024 Scr: 1.1 (EPIC 07/02/2024) Age: 74 Weight: 106.1 kg  Refill sent in

## 2024-07-19 ENCOUNTER — Other Ambulatory Visit (HOSPITAL_BASED_OUTPATIENT_CLINIC_OR_DEPARTMENT_OTHER): Payer: Self-pay

## 2024-07-22 ENCOUNTER — Other Ambulatory Visit (HOSPITAL_BASED_OUTPATIENT_CLINIC_OR_DEPARTMENT_OTHER): Payer: Self-pay

## 2024-07-25 ENCOUNTER — Other Ambulatory Visit: Payer: Self-pay | Admitting: Internal Medicine

## 2024-07-29 ENCOUNTER — Encounter (HOSPITAL_BASED_OUTPATIENT_CLINIC_OR_DEPARTMENT_OTHER): Payer: Self-pay

## 2024-07-29 NOTE — Progress Notes (Unsigned)
  Electrophysiology Office Note:   Date:  07/30/2024  ID:  Lawrence JONETTA Arnulfo Mickey., DOB 03/12/1951, MRN [id number]  Primary Cardiologist: Redell Shallow, MD Electrophysiologist: Will Gladis Norton, MD   Electrophysiologist:  Soyla Gladis Norton, MD      History of Present Illness:   Lawrence Niznik. is a 73 y.o. male with h/o CAD, atrial fibrillation s/p ablation, symptomatic bradycardia s/p PPM, TIA, HTN, orthostatic hypotension, HLD, DM, seen today for routine electrophysiology follow-up s/p Ablation.  S/p PVI and posterior wall ablation with Dr. Norton 05/01/24. Patient was doing well when seen for follow up in afib clinic, reported infrequent and brief palpitations.   Since last being seen in our clinic the patient reports doing well.  he remains active and denies chest pain, dyspnea, PND, orthopnea, nausea, vomiting, dizziness, syncope, edema, weight gain, or early satiety.  Reports occasional nocturnal palpitations, brief and self-limited.  Inquires today about being okay to have endoscopy and colonoscopy. Also wishes to discuss Watchman closure.   Review of systems complete and found to be negative unless listed in HPI.   EP Information / Studies Reviewed:    EKG is ordered today. Personal review as below.  EKG Interpretation Date/Time:  Tuesday July 30 2024 08:59:28 EST Ventricular Rate:  74 PR Interval:  198 QRS Duration:  82 QT Interval:  390 QTC Calculation: 432 R Axis:   -26  Text Interpretation: Atrial-paced rhythm Nonspecific ST abnormality Confirmed by Trudy Birmingham [phone]) on 07/30/2024 9:01:33 AM    Arrhythmia/Device History No specialty comments available.   Physical Exam:   VS:  BP 104/68   Pulse 88   Ht 6' 1 (1.854 m)   Wt 221 lb (100.2 kg)   SpO2 96%   BMI 29.16 kg/m    Wt Readings from Last 3 Encounters:  07/30/24 221 lb (100.2 kg)  05/31/24 234 lb (106.1 kg)  05/28/24 231 lb (104.8 kg)     GEN: No acute distress NECK: No JVD CARDIAC:  Regular rate and rhythm, no murmurs, rubs, gallops RESPIRATORY:  Clear to auscultation without rales, wheezing or rhonchi  ABDOMEN: Soft, non-tender, non-distended EXTREMITIES:  No edema; No deformity   ASSESSMENT AND PLAN:    Persistent atrial fibrillation s/p ablation Secondary hypercoagulable state S/p PVI and posterior wall ablation with Dr. Norton 05/01/24. Post ablation device checks without evidence of recurrent afib. Patient continues to feel well and denies symptoms suggestive of afib.  Continue Metoprolol  Tartrate 25mg  BID Continue Eliquis  5mg  BID. Discussed Watchman closure with patient at his request given hx frequent falls. He wishes to think about this more and consider after generator change-out.  Patient now 3 months out from ablation, okay to have procedures that require brief holds of his OAC.   Symptomatic bradycardia s/p PPM AP-VS on ECG today. Most recent remote report personally reviewed, show stable parameters with <0.1% afib. Estimated 2 months to ERI, now enrolled in monthly remote checks. Up to date for in person check (May 2025).  Follow up in 2 months.  Coronary artery disease No anginal symptoms per patient. Continue Crestor  per general cardiology.  Hypertension Orthostasis BP stable today. Patient reports orthostatic symptoms are well managed.     Follow up with EP APP 2 months.   Signed, [place] Trudy, PA-C

## 2024-07-30 ENCOUNTER — Encounter: Payer: Self-pay | Admitting: Cardiology

## 2024-07-30 ENCOUNTER — Other Ambulatory Visit: Payer: Self-pay | Admitting: Internal Medicine

## 2024-07-30 ENCOUNTER — Ambulatory Visit (HOSPITAL_COMMUNITY): Admitting: Physician Assistant

## 2024-07-30 ENCOUNTER — Ambulatory Visit: Attending: Physician Assistant | Admitting: Cardiology

## 2024-07-30 VITALS — BP 104/68 | HR 88 | Ht 73.0 in | Wt 221.0 lb

## 2024-07-30 DIAGNOSIS — R001 Bradycardia, unspecified: Secondary | ICD-10-CM

## 2024-07-30 DIAGNOSIS — I2510 Atherosclerotic heart disease of native coronary artery without angina pectoris: Secondary | ICD-10-CM | POA: Diagnosis not present

## 2024-07-30 DIAGNOSIS — I951 Orthostatic hypotension: Secondary | ICD-10-CM | POA: Diagnosis not present

## 2024-07-30 DIAGNOSIS — D6869 Other thrombophilia: Secondary | ICD-10-CM

## 2024-07-30 DIAGNOSIS — I480 Paroxysmal atrial fibrillation: Secondary | ICD-10-CM | POA: Diagnosis not present

## 2024-07-30 DIAGNOSIS — I4819 Other persistent atrial fibrillation: Secondary | ICD-10-CM

## 2024-07-30 NOTE — Patient Instructions (Signed)
 Medication Instructions:   Your physician recommends that you continue on your current medications as directed. Please refer to the Current Medication list given to you today.  *If you need a refill on your cardiac medications before your next appointment, please call your pharmacy*  Lab Work: NONE ORDERED  TODAY    If you have labs (blood work) drawn today and your tests are completely normal, you will receive your results only by: MyChart Message (if you have MyChart) OR A paper copy in the mail If you have any lab test that is abnormal or we need to change your treatment, we will call you to review the results.  Testing/Procedures: NONE ORDERED  TODAY    Follow-Up: At [facility], you and your health needs are our priority.  As part of our continuing mission to provide you with exceptional heart care, our providers are all part of one team.  This team includes your primary Cardiologist (physician) and Advanced Practice Providers or APPs (Physician Assistants and Nurse Practitioners) who all work together to provide you with the care you need, when you need it.  Your next appointment:   2 -3  month(s)   Provider:   You may see   Charlies Arthur, PA-C   Michael Andy Tillery, PA-C   Brandi Ollis, NP   Artist Pouch, PA-C  ( CONTACT  CASSIE HALL/ ANGELINE HAMMER FOR EP SCHEDULING ISSUES )     We recommend signing up for the patient portal called MyChart.  Sign up information is provided on this After Visit Summary.  MyChart is used to connect with patients for Virtual Visits (Telemedicine).  Patients are able to view lab/test results, encounter notes, upcoming appointments, etc.  Non-urgent messages can be sent to your provider as well.   To learn more about what you can do with MyChart, go to [email or web address].   Other Instructions

## 2024-08-03 ENCOUNTER — Other Ambulatory Visit: Payer: Self-pay | Admitting: Internal Medicine

## 2024-08-09 ENCOUNTER — Telehealth (HOSPITAL_BASED_OUTPATIENT_CLINIC_OR_DEPARTMENT_OTHER): Payer: Self-pay | Admitting: *Deleted

## 2024-08-09 NOTE — Telephone Encounter (Signed)
   Pre-operative Risk Assessment    Patient Name: Lawrence [name].  DOB: [birth year] MRN: [id number]   Date of last office visit: 07/30/2024 Date of next office visit: 09/24/2024   Request for Surgical Clearance    Procedure:  Colonoscopy/Endoscopy  Date of Surgery:  Clearance 09/19/24                                 Surgeon:  Dr. Layla Lah Surgeon's Group or Practice Name:  [facility] Phone number:  [phone] Fax number:  [phone]   Type of Clearance Requested:   - Medical  - Pharmacy:  Hold Apixaban  (Eliquis ) 2 days prior   Type of Anesthesia:  Propofol    Additional requests/questions:    Signed, Edsel Grayce Sanders   08/09/2024, 2:43 PM

## 2024-08-11 ENCOUNTER — Other Ambulatory Visit: Payer: Self-pay | Admitting: Internal Medicine

## 2024-08-11 ENCOUNTER — Ambulatory Visit: Attending: Cardiology

## 2024-08-11 DIAGNOSIS — E785 Hyperlipidemia, unspecified: Secondary | ICD-10-CM

## 2024-08-12 ENCOUNTER — Encounter

## 2024-08-13 ENCOUNTER — Encounter: Payer: Self-pay | Admitting: Cardiology

## 2024-08-13 ENCOUNTER — Ambulatory Visit: Payer: Self-pay | Admitting: Endocrinology

## 2024-08-13 ENCOUNTER — Encounter: Payer: Self-pay | Admitting: Endocrinology

## 2024-08-13 ENCOUNTER — Ambulatory Visit: Admitting: Endocrinology

## 2024-08-13 VITALS — BP 116/60 | HR 77 | Resp 16 | Ht 73.0 in | Wt 231.0 lb

## 2024-08-13 DIAGNOSIS — R809 Proteinuria, unspecified: Secondary | ICD-10-CM | POA: Diagnosis not present

## 2024-08-13 DIAGNOSIS — E1165 Type 2 diabetes mellitus with hyperglycemia: Secondary | ICD-10-CM

## 2024-08-13 DIAGNOSIS — Z7984 Long term (current) use of oral hypoglycemic drugs: Secondary | ICD-10-CM | POA: Diagnosis not present

## 2024-08-13 DIAGNOSIS — Z794 Long term (current) use of insulin: Secondary | ICD-10-CM | POA: Diagnosis not present

## 2024-08-13 LAB — CUP PACEART REMOTE DEVICE CHECK
Battery Remaining Longevity: 2 mo
Battery Voltage: 2.83 V
Brady Statistic AP VP Percent: 0.13 %
Brady Statistic AP VS Percent: 98.14 %
Brady Statistic AS VP Percent: 0 %
Brady Statistic AS VS Percent: 1.73 %
Brady Statistic RA Percent Paced: 97.83 %
Brady Statistic RV Percent Paced: 0.19 %
Date Time Interrogation Session: 20251208092622
Implantable Lead Connection Status: 753985
Implantable Lead Connection Status: 753985
Implantable Lead Implant Date: 20150827
Implantable Lead Implant Date: 20150827
Implantable Lead Location: 753859
Implantable Lead Location: 753860
Implantable Lead Model: 5076
Implantable Lead Model: 5076
Implantable Pulse Generator Implant Date: 20150827
Lead Channel Impedance Value: 323 Ohm
Lead Channel Impedance Value: 342 Ohm
Lead Channel Impedance Value: 456 Ohm
Lead Channel Impedance Value: 513 Ohm
Lead Channel Pacing Threshold Amplitude: 0.625 V
Lead Channel Pacing Threshold Amplitude: 0.875 V
Lead Channel Pacing Threshold Pulse Width: 0.4 ms
Lead Channel Pacing Threshold Pulse Width: 0.4 ms
Lead Channel Sensing Intrinsic Amplitude: 1 mV
Lead Channel Sensing Intrinsic Amplitude: 1 mV
Lead Channel Sensing Intrinsic Amplitude: 18 mV
Lead Channel Sensing Intrinsic Amplitude: 18 mV
Lead Channel Setting Pacing Amplitude: 1.5 V
Lead Channel Setting Pacing Amplitude: 2 V
Lead Channel Setting Pacing Pulse Width: 0.4 ms
Lead Channel Setting Sensing Sensitivity: 0.9 mV
Zone Setting Status: 755011
Zone Setting Status: 755011

## 2024-08-13 LAB — POCT GLYCOSYLATED HEMOGLOBIN (HGB A1C): Hemoglobin A1C: 6.7 % — AB (ref 4.0–5.6)

## 2024-08-13 MED ORDER — LOSARTAN POTASSIUM 25 MG PO TABS: 12.5000 mg | ORAL_TABLET | Freq: Every day | ORAL | 3 refills | Status: DC

## 2024-08-13 MED ORDER — INSULIN PEN NEEDLE 32G X 4 MM MISC: 3 refills | Status: AC

## 2024-08-13 MED ORDER — EMPAGLIFLOZIN 25 MG PO TABS: 25.0000 mg | ORAL_TABLET | Freq: Every day | ORAL | 3 refills | Status: AC

## 2024-08-13 MED ORDER — NOVOLOG MIX 70/30 FLEXPEN (70-30) 100 UNIT/ML ~~LOC~~ SUPN: 20.0000 [IU] | PEN_INJECTOR | Freq: Every day | SUBCUTANEOUS | 4 refills | Status: AC

## 2024-08-13 NOTE — Patient Instructions (Signed)
 Humulin  70/30 or Novolog  mix 20 untis with breakfast.  Start jardiacne 25 mg daily.  Start losartan  12.5 mg daily.

## 2024-08-13 NOTE — Progress Notes (Signed)
 PERIOPERATIVE PRESCRIPTION FOR IMPLANTED CARDIAC DEVICE PROGRAMMING  Patient Information: Name:  Lawrence [name].  DOB:  Oct 14, 1950  MRN:  [id number]    Procedure:  Colonoscopy/Endoscopy Date of Surgery:  Clearance 09/19/24                              Surgeon:  Dr. Layla Lah Surgeon's Group or Practice Name:  [facility] Type of Anesthesia:  Propofol     Device Information:  Clinic EP Physician:  Soyla Norton, MD   Device Type:  Pacemaker Manufacturer and Phone #:  [employer]tronic: [id number] Pacemaker Dependent?:  No. Date of Last Device Check:  08/12/24    Normal Device Function?:  Yes.    Electrophysiologist's Recommendations:  Have magnet available. Provide continuous ECG monitoring when magnet is used or reprogramming is to be performed.  Procedure should not interfere with device function.  No device programming or magnet placement needed.  Per Device Clinic Standing Orders, Powell Level, RN  1:34 PM 08/13/2024

## 2024-08-13 NOTE — Progress Notes (Signed)
 Outpatient Endocrinology Note Arlette Schaad, MD  08/13/24  Patient's Name: Lawrence [name].    DOB: Jan 10, 1951    MRN: [id number]                                                    REASON OF VISIT: Follow-up of type 2 diabetes mellitus   REFERRING PROVIDER: Amon Aloysius BRAVO, MD  PCP: Amon Aloysius BRAVO, MD  HISTORY OF PRESENT ILLNESS:   Lawrence Rands. is a 73 y.o. old male with past medical history listed below, is here for follow-up of  type 2 diabetes mellitus.   Pertinent Diabetes History: Patient was diagnosed with type 2 diabetes mellitus in 2013 at that time hemoglobin A1c was 8.6%.  From 2022 he was following with [facility] for the diabetes care, he was hospitalized postoperatively at that time for surgical intervention for post esophagectomy anastomotic leak, he was also doing telemedicine visits as well, he was last time seen in March 2024. He wants to transfer over his diabetes care in this clinic, was initially seen in August 2024.  Patient has history of stage II esophageal adenocarcinoma status post exploratory laparotomy for transhiatal esophagogastrectomy in 2013.  Chronic Diabetes Complications : Retinopathy: yes. Following with ophthalmology / retinal specialist, every 6 months.  Nephropathy: no Peripheral neuropathy: yes, numbness and tingling + Coronary artery disease: no Stroke: no  Relevant comorbidities and cardiovascular risk factors: Obesity: yes Body mass index is 30.48 kg/m.  Hypertension: yes Hyperlipidemia. Yes, on statin   Current / Home Diabetic regimen includes:  Humulin  70/30 insulin  22 units with breakfast and 6 units with supper.  Prior diabetic medications: Metformin , stopped due to diarrhea.   Glycemic data:    CONTINUOUS GLUCOSE MONITORING SYSTEM (CGMS) INTERPRETATION: At today's visit, we reviewed CGM downloads. The full report is scanned in the media. Reviewing the CGM trends, blood glucose are as follows:  FreeStyle Libre 2  CGM-  Sensor Download (Sensor download was reviewed and summarized below.) Dates: November 26 to August 13, 2024, 14 days     Interpretation: Mostly acceptable blood sugar with occasional hypoglycemia overnight with blood sugar in 60s and sometimes hypoglycemia in between the meals in the afternoon as well.  Occasionally blood sugar up to 250-300 range especially with supper and sometime in the morning related to meals.  Overall improvement on diabetes control with hypoglycemia.  GMI on CGM 6.5%.  Hypoglycemia: Patient has minor hypoglycemic episodes. Patient has hypoglycemia awareness.  Factors modifying glucose control: 1.  Diabetic diet assessment: He generally eats a small frequent meals.  He has habits of drinking wine daily and cutting down.  Usually eats 2 meals a day and a light lunch.  2.  Staying active or exercising:   3.  Medication compliance: compliant all of the time.  # Elevated alkaline phosphatase likely related to liver disorder:  -Patient has elevated alkaline phosphatase since August 2023.  Previously he had normal alkaline phosphatase.. Bone scan showed overall increased tracer uptake throughout the axial and appendicular skeleton raising question of metabolic bone disease.  Alkaline phosphatase 138 to 171 with upper normal limit of 126.  Mildly elevated liver enzyme AST and ALT.  Elevated GGT, 1 36-141 with upper normal limit of 65. -He has normal serum calcium .  Normal vitamin D  level.  Normal PSA. -Patient  has been drinking alcohol significantly mainly wine almost every day. -Patient has been following with gastroenterology at [facility] gastroenterology.  Interval history  CGM data as reviewed above.  Hemoglobin A1c has improved to 6.7%.  Has occasional hypoglycemia.  Diabetes regimen is reviewed and noted above.  He has been taking insulin  before eating.  No other complaints today.  REVIEW OF SYSTEMS As per history of present illness.   PAST MEDICAL  HISTORY: Past Medical History:  Diagnosis Date   Anxiety 01/20/2023   Bradycardia    Cancer (HCC) 2013   esophageal cancer   Depression    Dysrhythmia    Afib   GERD (gastroesophageal reflux disease)    H/O hiatal hernia    s/p  repair 06-26-2012   History of esophagectomy 08-21-2012  AT [facility]   History of kidney stones 2006   History of malignant neoplasm of esophagus ONCOLOGIST-  DR D'AMICO AT [facility]-- PER LAST NOTE 01/ 2018  NO RECURRENCE   dx 10/ 2013  Stage 2A (T2 N0)  08-21-2012 s/p  esophagectomy w/ gastric pull-through [facility]) post residual recurrent anastomotic stricture (multiple EGD w/ dilations)   History of transient ischemic attack (TIA) 2003   no residual   Hypertension    Long term (current) use of anticoagulants    xarelto    Macular degeneration    Neuromuscular disorder (HCC)    neuropathy of feet   On dofetilide  therapy    PAF (paroxysmal atrial fibrillation) (HCC) first dx 2009   primary cardiologist-  dr victory sharps /  EP cardiologist -- dr melchor ([facility])  s/p  afib ablation 08-02-2016   Pancreatic cyst    x2    Peyronie's disease    Presence of permanent cardiac pacemaker implanted 05-01-2014  at [facility]   Dr.Daubert -[facility] follows   Pulmonary nodule, right    upper and middle lobes --  per last CT stable   Type 2 diabetes mellitus treated with insulin  (HCC)    Wears glasses     PAST SURGICAL HISTORY: Past Surgical History:  Procedure Laterality Date   ANTERIOR CERVICAL DECOMP/DISCECTOMY FUSION N/A 06/02/2016   Procedure: ANTERIOR CERVICAL DECOMPRESSION FUSION CERVICAL 5-6, CERVICAL 6-7 WITH INSTRUMENTATION AND ALLOGRAFT;  Surgeon: Oneil Priestly, MD;  Location: [facility] OR;  Service: Orthopedics;  Laterality: N/A;  ANTERIOR CERVICAL DECOMPRESSION FUSION CERVICAL 5-6, CERVICAL 6-7 WITH INSTRUMENTATION AND ALLOGRAFT   ATRIAL FIBRILLATION ABLATION N/A 05/01/2024   Procedure: ATRIAL FIBRILLATION ABLATION;  Surgeon: Inocencio Soyla Lunger, MD;  Location:  [facility] INVASIVE CV LAB;  Service: Cardiovascular;  Laterality: N/A;   BIOPSY  06/26/2012   Procedure: BIOPSY;  Surgeon: Redell Faith, DO;  Location: [facility] ORS;  Service: General;;   CARDIAC ELECTROPHYSIOLOGY STUDY AND ABLATION  08-02-2016   [facility]   (dr melchor)   pulmonary veins isolation and ablation afib   CARDIAC PACEMAKER PLACEMENT  05-01-2014   [facility]   CARDIOVERSION N/A 01/31/2024   Procedure: CARDIOVERSION;  Surgeon: Lonni Slain, MD;  Location: [facility] INVASIVE CV LAB;  Service: Cardiovascular;  Laterality: N/A;   CHOLECYSTECTOMY  1983   COLONOSCOPY WITH PROPOFOL  N/A 07/13/2015   Procedure: COLONOSCOPY WITH PROPOFOL ;  Surgeon: Lunger MARLA Louder, MD;  Location: [facility] ENDOSCOPY;  Service: Endoscopy;  Laterality: N/A;   ESOPHAGECTOMY  08-21-2012   [facility]   w/ gastric pull-through   ESOPHAGOGASTRODUODENOSCOPY (EGD) WITH ESOPHAGEAL DILATION  multiple x32 per pt approx.--- last one 02-09-2015   recurrent anastomotic stricture post esophagectomy   HIATAL HERNIA REPAIR  06/26/2012  Procedure: LAPAROSCOPIC REPAIR OF HIATAL HERNIA;  Surgeon: Redell Faith, DO;  Location: [facility] ORS;  Service: General;;   HUMERUS IM NAIL Right 12/28/2023   Procedure: INSERTION, INTRAMEDULLARY ROD, HUMERUS;  Surgeon: Dozier Soulier, MD;  Location: [facility] ORS;  Service: Orthopedics;  Laterality: Right;   INCISIONAL HERNIA REPAIR  06/26/2012   Procedure: HERNIA REPAIR INCISIONAL;  Surgeon: Redell Faith, DO;  Location: [facility] ORS;  Service: General;;   INGUINAL HERNIA REPAIR Bilateral 1982   JEJUNOSTOMY FEEDING TUBE  12/22/2012   Removed 12/ 2016  approx.   KNEE ARTHROSCOPY Left 1990   KNEE ARTHROSCOPY Right 12/2019   LAPAROSCOPIC GASTRIC SLEEVE RESECTION  06/2012   started but abandoned d/t discovery of tumor   LAPAROTOMY  07/12/2021   revision of previous gastric conduit, hiatal hernia repair   MASS EXCISION Left 04/04/2013   Procedure: EXCISION OF SCALP MASS;  Surgeon: Redell Faith, DO;  Location: [facility] ORS;  Service: General;   Laterality: Left;   pilomatrixoma   NESBIT PROCEDURE N/A 12/09/2016   Procedure: NESBIT PROCEDURE 16 DOT PLICATION;  Surgeon: Mark Ottelin, MD;  Location: [facility];  Service: Urology;  Laterality: N/A;   THORACOTOMY  07/12/2021   revision of previous gastric conduit   TONSILLECTOMY  1959   TRANSTHORACIC ECHOCARDIOGRAM  08-06-2015   [facility]   mild LVH, ef 50%/  mild RAE /  moderate LAE/  trivial AR, MR, PR,  and TR    ALLERGIES: Allergies  Allergen Reactions   Chlorthalidone Other (See Comments)    urinary urgency   Lisinopril  Other (See Comments)    cough   Metformin  Hcl Other (See Comments)    fecal urgency    FAMILY HISTORY:  Family History  Problem Relation Age of Onset   Hypertension Mother    Heart failure Mother    Prostate cancer Father    Kidney cancer Father    Emphysema Father    Aortic aneurysm Father    Anorexia nervosa Sister    Depression Sister    Anorexia nervosa Sister    Heart disease Sister    Hypertension Brother    Cancer Maternal Grandmother    Heart attack Maternal Grandfather    Heart disease Maternal Grandfather    Cancer Paternal Grandmother    Alcohol abuse Paternal Grandfather    Early death Paternal Grandfather    Tuberculosis Paternal Grandfather    Colon cancer Neg Hx     SOCIAL HISTORY: Social History   Socioeconomic History   Marital status: Married    Spouse name: Not on file   Number of children: 4   Years of education: Not on file   Highest education level: Bachelor's degree (e.g., BA, AB, BS)  Occupational History   Occupation: retired, medical illustrator, designer, fashion/clothing  Tobacco Use   Smoking status: Never    Passive exposure: Past   Smokeless tobacco: Never   Tobacco comments:    Never smoked 01/26/24  Vaping Use   Vaping status: Never Used  Substance and Sexual Activity   Alcohol use: Not Currently    Comment: socially   Drug use: Never   Sexual activity: Not Currently  Other Topics Concern   Not on file   Social History Narrative   Household- lives w/ wife   Social Drivers of Health   Financial Resource Strain: Low Risk  (05/17/2024)   Overall Financial Resource Strain (CARDIA)    Difficulty of Paying Living Expenses: Not very hard  Food Insecurity: No Food Insecurity (05/17/2024)  Hunger Vital Sign    Worried About Running Out of Food in the Last Year: Never true    Ran Out of Food in the Last Year: Never true  Transportation Needs: No Transportation Needs (05/17/2024)   PRAPARE - Administrator, Civil Service (Medical): No    Lack of Transportation (Non-Medical): No  Physical Activity: Sufficiently Active (05/17/2024)   Exercise Vital Sign    Days of Exercise per Week: 5 days    Minutes of Exercise per Session: 30 min  Stress: Stress Concern Present (05/17/2024)   [employer] of Occupational Health - Occupational Stress Questionnaire    Feeling of Stress: To some extent  Social Connections: Socially Isolated (05/17/2024)   Social Connection and Isolation Panel    Frequency of Communication with Friends and Family: Once a week    Frequency of Social Gatherings with Friends and Family: Once a week    Attends Religious Services: Never    Database Administrator or Organizations: No    Attends Engineer, Structural: Never    Marital Status: Married    MEDICATIONS:  Current Outpatient Medications  Medication Sig Dispense Refill   acetaminophen  (TYLENOL ) 500 MG tablet Take 500-1,000 mg by mouth every 6 (six) hours as needed (pain.). (Patient taking differently: Take 500-1,000 mg by mouth as needed (pain.).)     Alcohol Swabs ([employer] PREP) 70 % PADS USE AS DIRECTED 200 each 2   Continuous Glucose Sensor (FREESTYLE LIBRE 2 PLUS SENSOR) MISC Inject 1 each into the skin as directed. Change sensor every 15 days 6 each 3   cyanocobalamin  (VITAMIN B12) 1000 MCG tablet Take 1,000 mcg by mouth every evening.     ELIQUIS  5 MG TABS tablet TAKE 1 TABLET BY MOUTH TWICE A DAY  180 tablet 1   empagliflozin  (JARDIANCE ) 25 MG TABS tablet Take 1 tablet (25 mg total) by mouth daily before breakfast. 90 tablet 3   ferrous sulfate 325 (65 FE) MG tablet Take 325 mg by mouth every evening.     FLUoxetine  (PROZAC ) 20 MG tablet Take 3 tablets (60 mg total) by mouth daily. 270 tablet 1   hydrOXYzine  (ATARAX ) 25 MG tablet Take 1 tablet (25 mg total) by mouth 2 (two) times daily as needed for anxiety (Insomnia). 40 tablet 1   insulin  aspart protamine  - aspart (NOVOLOG  MIX 70/30 FLEXPEN) (70-30) 100 UNIT/ML FlexPen Inject 20 Units into the skin daily with breakfast. 15 mL 4   Insulin  Pen Needle 32G X 4 MM MISC Daily. 100 each 3   Insulin  Syringe-Needle U-100 (BD VEO INSULIN  SYRINGE U/F) 31G X 15/64 0.3 ML MISC Use as directed in the morning, at noon, in the evening, and at bedtime. 300 each 3   losartan  (COZAAR ) 25 MG tablet Take 0.5 tablets (12.5 mg total) by mouth daily. 45 tablet 3   metoprolol  tartrate (LOPRESSOR ) 25 MG tablet Take 1 tablet (25 mg total) by mouth 2 (two) times daily. 180 tablet 2   Multiple Vitamins-Minerals (PRESERVISION AREDS PO) Take 1 tablet by mouth in the morning and at bedtime. Preservision AREDS     rosuvastatin  (CRESTOR ) 40 MG tablet TAKE 1 TABLET BY MOUTH EVERY DAY 90 tablet 1   tiZANidine  (ZANAFLEX ) 4 MG tablet Take 1 tablet (4 mg total) by mouth every 8 (eight) hours as needed. (Patient taking differently: Take 4 mg by mouth every morning.) 30 tablet 0   No current facility-administered medications for this visit.    PHYSICAL EXAM:  Vitals:   08/13/24 1014  BP: 116/60  Pulse: 77  Resp: 16  SpO2: 97%  Weight: 231 lb (104.8 kg)  Height: 6' 1 (1.854 m)    Body mass index is 30.48 kg/m.  Wt Readings from Last 3 Encounters:  08/13/24 231 lb (104.8 kg)  07/30/24 221 lb (100.2 kg)  05/31/24 234 lb (106.1 kg)    General: Well developed, well nourished male in no apparent distress.  HEENT: AT/[place], no external lesions.  Eyes: Conjunctiva clear  and no icterus. Neck: Neck supple  Lungs: Respirations not labored Neurologic: Alert, oriented, normal speech Extremities / Skin: Dry.  Psychiatric: Does not appear depressed or anxious  Diabetic Foot Exam - Simple   No data filed    LABS Reviewed Lab Results  Component Value Date   HGBA1C 6.7 (A) 08/13/2024   HGBA1C 7.3 (A) 05/14/2024   HGBA1C 6.9 (A) 02/07/2024   No results found for: FRUCTOSAMINE Lab Results  Component Value Date   CHOL 120 05/28/2024   HDL 52.90 05/28/2024   LDLCALC 44 05/28/2024   TRIG 115.0 05/28/2024   CHOLHDL 2 05/28/2024   Lab Results  Component Value Date   MICRALBCREAT 218.3 (H) 05/28/2024   MICRALBCREAT 63 (H) 05/14/2024    Lab Results  Component Value Date   CREATININE 1.1 07/02/2024   No results found for: GFR  ASSESSMENT / PLAN  1. Uncontrolled type 2 diabetes mellitus with hyperglycemia (HCC)   2. Microalbuminuria     Diabetes Mellitus type 2, complicated by diabetic retinopathy and neuropathy. - Diabetic status / severity: Uncontrolled.  Improving.  Lab Results  Component Value Date   HGBA1C 6.7 (A) 08/13/2024    - Hemoglobin A1c goal : <6.5%  He got a letter from [facility] no longer would be covering Humulin  70/30, with switch to NovoLog  Mix 70/30.  Due to hypoglycemia we will stop the evening dose of insulin  in the context of starting Jardiance .  - Medications: See below.  Change on the dose.  I) stop Humulin  70/30 :  22 units in the morning with breakfast and   6 units in the evening with supper.  And decrease insulin  dose. II) start NovoLog  mix 20 units in the morning with breakfast only. III) start Jardiance  25 mg daily.  If he cannot get Jardiance  or SGLT2 inhibitor, advised to take Humulin  70/30 or NovoLog  mix 4 units with supper.  - will likely avoid GLP-1 receptor agonist, history of gastric/esophageal surgery and upper GI symptoms.  Patient is asked to take insulin  15 to 30 minutes before eating.  -  Home glucose testing: CGM/freestyle libre and check as needed.  - Discussed/ Gave Hypoglycemia treatment plan.  # Consult : not required at this time.   # Annual urine for microalbuminuria/ creatinine ratio, + microalbuminuria currently.  Start Jardiance .  Start low-dose of losartan  12.5 mg daily.  He has also been following with nephrology. Last  Lab Results  Component Value Date   MICRALBCREAT 218.3 (H) 05/28/2024     # Foot check nightly / neuropathy.  Advised to follow-up with podiatry.  # He has diabetic retinopathy, following with ophthalmology every 6 months.  - Diet: Eat reasonable portion sizes to promote a healthy weight - Life style / activity / exercise: Discussed.  2. Blood pressure  -  BP Readings from Last 1 Encounters:  08/13/24 116/60    - Control is in target. Advised patient to monitor blood pressure at home after starting losartan . - No change  in current plans.  3. Lipid status / Hyperlipidemia - Last  Lab Results  Component Value Date   LDLCALC 44 05/28/2024   - Continue rosuvastatin  40 mg daily.  Managed by PCP.   Diagnoses and all orders for this visit:  Uncontrolled type 2 diabetes mellitus with hyperglycemia (HCC) -     POCT glycosylated hemoglobin (Hb A1C) -     empagliflozin  (JARDIANCE ) 25 MG TABS tablet; Take 1 tablet (25 mg total) by mouth daily before breakfast. -     insulin  aspart protamine  - aspart (NOVOLOG  MIX 70/30 FLEXPEN) (70-30) 100 UNIT/ML FlexPen; Inject 20 Units into the skin daily with breakfast. -     losartan  (COZAAR ) 25 MG tablet; Take 0.5 tablets (12.5 mg total) by mouth daily. -     Insulin  Pen Needle 32G X 4 MM MISC; Daily.  Microalbuminuria   DISPOSITION Follow up in clinic in 3  months suggested.  Labs on the same day of the visit.   All questions answered and patient verbalized understanding of the plan.  Vern Prestia, MD [facility] Endocrinology [facility] [address] [place], [address] [place],  [place] [address] Phone # [phone]  At least part of this note was generated using voice recognition software. Inadvertent word errors may have occurred, which were not recognized during the proofreading process.

## 2024-08-13 NOTE — Telephone Encounter (Signed)
 Patient with diagnosis of afib on Eliquis  for anticoagulation.    Procedure:  Colonoscopy/Endoscopy   Date of Surgery:  Clearance 09/19/24    CHA2DS2-VASc Score = 6   This indicates a 9.7% annual risk of stroke. The patient's score is based upon: CHF History: 0 HTN History: 1 Diabetes History: 1 Stroke History: 2 (TIA in 2003) Vascular Disease History: 1 Age Score: 1 Gender Score: 0    CrCl 77 ml/min Platelet count 178 K  Patient has not had an Afib/aflutter ablation in the last 3 months, DCCV within the last 4 weeks or a watchman implanted in the last 45 days   S/p PVI and posterior wall ablation with Dr. Inocencio on 05/01/24  Per last cardiology note (07/30/24): Patient now 3 months out from ablation, okay to have procedures that require brief holds of his OAC.   Also, at visit, discussed watchman closure and patient wishes to think about this more and consider after generator change out  Per office protocol, patient can hold Eliquis  for 2 days prior to procedure.    Patient will not need bridging with Lovenox  (enoxaparin ) around procedure.  **This guidance is not considered finalized until pre-operative APP has relayed final recommendations.**

## 2024-08-13 NOTE — Telephone Encounter (Signed)
   Patient Name: Lawrence [name].  DOB: July 09, 1951 MRN: [id number]  Primary Cardiologist: Redell Shallow, MD  Chart reviewed as part of pre-operative protocol coverage.  Patient was recently seen in clinic by Artist Pouch, PA on 07/30/2024.  Patient had remained stable from a cardiac standpoint and was active.  Given past medical history and time since last visit, based on ACC/AHA guidelines, Lincoln Ginley. is at acceptable risk for the planned procedure without further cardiovascular testing.   Per office protocol, patient can hold Eliquis  for 2 days prior to procedure.  Please resume as soon as safe to do so from a bleeding standpoint.  I will route this recommendation to the requesting party via Epic fax function and remove from pre-op pool.  Please call with questions.  Bentlie Withem D Lucrecia Mcphearson, NP 08/13/2024, 12:57 PM

## 2024-08-13 NOTE — Telephone Encounter (Signed)
 Device Clearance faxed via EPIC fax function- see documentation encounter dated 08/13/24.

## 2024-08-14 ENCOUNTER — Encounter: Payer: Self-pay | Admitting: Family Medicine

## 2024-08-14 ENCOUNTER — Ambulatory Visit: Admitting: Family Medicine

## 2024-08-14 ENCOUNTER — Ambulatory Visit: Payer: Self-pay | Admitting: Cardiology

## 2024-08-14 VITALS — BP 93/60 | HR 85 | Ht 73.0 in | Wt 229.0 lb

## 2024-08-14 DIAGNOSIS — F32A Depression, unspecified: Secondary | ICD-10-CM | POA: Diagnosis not present

## 2024-08-14 DIAGNOSIS — F419 Anxiety disorder, unspecified: Secondary | ICD-10-CM | POA: Diagnosis not present

## 2024-08-14 NOTE — Progress Notes (Signed)
 Acute Office Visit  Subjective:  Patient ID: Lawrence Tesar., male    DOB: 1951-07-18  Age: 73 y.o. MRN: [id number]  CC:  Chief Complaint  Patient presents with   Medical Management of Chronic Issues    Mood changes      HPI Lawrence Giannini. is here for mood swings and aggressive moods.   Discussed the use of AI scribe software for clinical note transcription with the patient, who gave verbal consent to proceed.  History of Present Illness Lawrence Barberi. Lawrence [name] is a 73 year old male who presents with increased irritability and mood changes. He is with his wife today.   He has experienced increased irritability and impatience, describing his behavior as 'mean' to those around him, including family and strangers. This behavior is verbal, not physical. Lawrence [name] corroborates this, noting that he becomes easily frustrated and aggravated. He attributes some of this to a lack of hobbies and activities since retiring from a driven designer, television/film set.  He has been on fluoxetine  (Prozac ) for several years, currently taking 60 mg daily, and uses hydroxyzine  as needed for anxiety. He previously used lorazepam  and tramadol  but discontinued these due to falls. He tends to take hydroxyzine  more in the afternoon and evening, sometimes two doses close together.  He has a history of esophageal cancer and subsequent surgery, which led to a change in his alcohol consumption habits. He drinks a bottle of white wine nightly, describing it as a 'ritual' for relaxation. He denies withdrawal symptoms when abstaining from alcohol for extended periods.  He has a history of panic attacks but not recently. He experiences anxiety-related gut spasms, which have resurfaced over the past six months. No current panic attacks, but he notes a feeling of restlessness and unease.  He has a history of falls, with the most recent in April 2025, resulting in a broken arm. He describes decreased physical activity and  stamina, feeling exhausted after minimal exertion, such as walking in a store. He acknowledges a sedentary lifestyle since retiring and the impact of COVID-19 pandemic on his activity levels.  He reports some age-related forgetfulness but denies significant confusion or disorientation. Lawrence [name] notes he is still competent with tasks like handling finances.  His diet is limited due to swallowing difficulties post-surgery, leading to small, frequent meals. He acknowledges not eating a well-balanced diet and poor hydration habits.  Denies SI/HI.         08/14/2024    1:07 PM 05/28/2024    9:17 AM 05/17/2024    9:14 AM  PHQ9 SCORE ONLY  PHQ-9 Total Score 9 13 14       08/14/2024    1:08 PM 05/28/2024    9:17 AM  GAD 7 : Generalized Anxiety Score  Nervous, Anxious, on Edge 3 2  Control/stop worrying 3 2  Worry too much - different things 3 2  Trouble relaxing 3 2  Restless 1 1  Easily annoyed or irritable 3 3  Afraid - awful might happen 2 2  Total GAD 7 Score 18 14  Anxiety Difficulty Very difficult Somewhat difficult      Past Medical History:  Diagnosis Date   Anxiety 01/20/2023   Bradycardia    Cancer (HCC) 2013   esophageal cancer   Depression    Dysrhythmia    Afib   GERD (gastroesophageal reflux disease)    H/O hiatal hernia    s/p  repair 06-26-2012   History of esophagectomy 08-21-2012  AT [facility]   History of kidney stones 2006   History of malignant neoplasm of esophagus ONCOLOGIST-  DR D'AMICO AT [facility]-- PER LAST NOTE 01/ 2018  NO RECURRENCE   dx 10/ 2013  Stage 2A (T2 N0)  08-21-2012 s/p  esophagectomy w/ gastric pull-through [facility]) post residual recurrent anastomotic stricture (multiple EGD w/ dilations)   History of transient ischemic attack (TIA) 2003   no residual   Hypertension    Long term (current) use of anticoagulants    xarelto    Macular degeneration    Neuromuscular disorder (HCC)    neuropathy of feet   On dofetilide  therapy    PAF  (paroxysmal atrial fibrillation) (HCC) first dx 2009   primary cardiologist-  dr victory sharps /  EP cardiologist -- dr melchor ([facility])  s/p  afib ablation 08-02-2016   Pancreatic cyst    x2    Peyronie's disease    Presence of permanent cardiac pacemaker implanted 05-01-2014  at [facility]   Dr.Daubert -[facility] follows   Pulmonary nodule, right    upper and middle lobes --  per last CT stable   Type 2 diabetes mellitus treated with insulin  (HCC)    Wears glasses     Past Surgical History:  Procedure Laterality Date   ANTERIOR CERVICAL DECOMP/DISCECTOMY FUSION N/A 06/02/2016   Procedure: ANTERIOR CERVICAL DECOMPRESSION FUSION CERVICAL 5-6, CERVICAL 6-7 WITH INSTRUMENTATION AND ALLOGRAFT;  Surgeon: Oneil Priestly, MD;  Location: [facility] OR;  Service: Orthopedics;  Laterality: N/A;  ANTERIOR CERVICAL DECOMPRESSION FUSION CERVICAL 5-6, CERVICAL 6-7 WITH INSTRUMENTATION AND ALLOGRAFT   ATRIAL FIBRILLATION ABLATION N/A 05/01/2024   Procedure: ATRIAL FIBRILLATION ABLATION;  Surgeon: Inocencio Soyla Lunger, MD;  Location: [facility] INVASIVE CV LAB;  Service: Cardiovascular;  Laterality: N/A;   BIOPSY  06/26/2012   Procedure: BIOPSY;  Surgeon: Redell Faith, DO;  Location: [facility] ORS;  Service: General;;   CARDIAC ELECTROPHYSIOLOGY STUDY AND ABLATION  08-02-2016   [facility]   (dr melchor)   pulmonary veins isolation and ablation afib   CARDIAC PACEMAKER PLACEMENT  05-01-2014   [facility]   CARDIOVERSION N/A 01/31/2024   Procedure: CARDIOVERSION;  Surgeon: Lonni Slain, MD;  Location: [facility] INVASIVE CV LAB;  Service: Cardiovascular;  Laterality: N/A;   CHOLECYSTECTOMY  1983   COLONOSCOPY WITH PROPOFOL  N/A 07/13/2015   Procedure: COLONOSCOPY WITH PROPOFOL ;  Surgeon: Lunger MARLA Louder, MD;  Location: [facility] ENDOSCOPY;  Service: Endoscopy;  Laterality: N/A;   ESOPHAGECTOMY  08-21-2012   [facility]   w/ gastric pull-through   ESOPHAGOGASTRODUODENOSCOPY (EGD) WITH ESOPHAGEAL DILATION  multiple x32 per pt approx.--- last one  02-09-2015   recurrent anastomotic stricture post esophagectomy   HIATAL HERNIA REPAIR  06/26/2012   Procedure: LAPAROSCOPIC REPAIR OF HIATAL HERNIA;  Surgeon: Redell Faith, DO;  Location: [facility] ORS;  Service: General;;   HUMERUS IM NAIL Right 12/28/2023   Procedure: INSERTION, INTRAMEDULLARY ROD, HUMERUS;  Surgeon: Dozier Soulier, MD;  Location: [facility] ORS;  Service: Orthopedics;  Laterality: Right;   INCISIONAL HERNIA REPAIR  06/26/2012   Procedure: HERNIA REPAIR INCISIONAL;  Surgeon: Redell Faith, DO;  Location: [facility] ORS;  Service: General;;   INGUINAL HERNIA REPAIR Bilateral 1982   JEJUNOSTOMY FEEDING TUBE  12/22/2012   Removed 12/ 2016  approx.   KNEE ARTHROSCOPY Left 1990   KNEE ARTHROSCOPY Right 12/2019   LAPAROSCOPIC GASTRIC SLEEVE RESECTION  06/2012   started but abandoned d/t discovery of tumor   LAPAROTOMY  07/12/2021   revision of previous gastric conduit, hiatal hernia repair  MASS EXCISION Left 04/04/2013   Procedure: EXCISION OF SCALP MASS;  Surgeon: Redell Faith, DO;  Location: [facility] OR[facility];  Service: General;  Laterality: Left;   pilomatrixoma   NESBIT PROCEDURE N/A 12/09/2016   Procedure: NESBIT PROCEDURE 16 DOT PLICATION;  Surgeon: Mark Ottelin, MD;  Location[facility];  Service: Urology;  Laterality: N/A;   THORACOTOMY  07/12/2021   revision of previous gastric conduit   TONSILLECTOMY  1959   TRANSTHORACIC ECHOCARDIOGRAM  08-06-2015   [facility]   mild LVH, ef 50%/  mild RAE /  moderate LAE/  trivial AR, MR, PR,  and TR    Family History  Problem Relation Age of Onset   Hypertension Mother    Heart failure Mother    Prostate cancer Father    Kidney cancer Father    Emphysema Father    Aortic aneurysm Father    Anorexia nervosa Sister    Depression Sister    Anorexia nervosa Sister    Heart disease Sister    Hypertension Brother    Cancer Maternal Grandmother    Heart attack Maternal Grandfather    Heart disease Maternal Grandfather    Cancer Paternal  Grandmother    Alcohol abuse Paternal Grandfather    Early death Paternal Grandfather    Tuberculosis Paternal Grandfather    Colon cancer Neg Hx     Social History   Socioeconomic History   Marital status: Married    Spouse name: Not on file   Number of children: 4   Years of education: Not on file   Highest education level: Bachelor's degree (e.g., BA, AB, BS)  Occupational History   Occupation: retired, medical illustrator, designer, fashion/clothing  Tobacco Use   Smoking status: Never    Passive exposure: Past   Smokeless tobacco: Never   Tobacco comments:    Never smoked 01/26/24  Vaping Use   Vaping status: Never Used  Substance and Sexual Activity   Alcohol use: Not Currently    Comment: socially   Drug use: Never   Sexual activity: Not Currently  Other Topics Concern   Not on file  Social History Narrative   Household- lives w/ wife   Social Drivers of Health   Financial Resource Strain: Low Risk  (05/17/2024)   Overall Financial Resource Strain (CARDIA)    Difficulty of Paying Living Expenses: Not very hard  Food Insecurity: No Food Insecurity (05/17/2024)   Hunger Vital Sign    Worried About Running Out of Food in the Last Year: Never true    Ran Out of Food in the Last Year: Never true  Transportation Needs: No Transportation Needs (05/17/2024)   PRAPARE - Administrator, Civil Service (Medical): No    Lack of Transportation (Non-Medical): No  Physical Activity: Sufficiently Active (05/17/2024)   Exercise Vital Sign    Days of Exercise per Week: 5 days    Minutes of Exercise per Session: 30 min  Stress: Stress Concern Present (05/17/2024)   Lawrence [name]    Feeling of Stress: To some extent  Social Connections: Socially Isolated (05/17/2024)   Social Connection and Isolation Panel    Frequency of Communication with Friends and Family: Once a week    Frequency of Social Gatherings with Friends and Family: Once  a week    Attends Religious Services: Never    Database Administrator or Organizations: No    Attends Banker Meetings: Never  Marital Status: Married  Catering Manager Violence: Not At Risk (05/17/2024)   Humiliation, Afraid, Rape, and Kick [name]    Fear of Current or Ex-Partner: No    Emotionally Abused: No    Physically Abused: No    Sexually Abused: No    ROS All ROS negative except what is listed in the HPI.   Objective:   Today's Vitals: BP 93/60   Pulse 85   Ht 6' 1 (1.854 m)   Wt 229 lb (103.9 kg)   SpO2 98%   BMI 30.21 kg/m   Physical Exam Vitals reviewed.  Constitutional:      General: He is not in acute distress.    Appearance: Normal appearance. He is not ill-appearing.  Cardiovascular:     Rate and Rhythm: Normal rate and regular rhythm.     Heart sounds: Normal heart sounds.  Pulmonary:     Effort: Pulmonary effort is normal.     Breath sounds: Normal breath sounds.  Skin:    General: Skin is warm and dry.  Neurological:     Mental Status: He is alert and oriented to person, place, and time.  Psychiatric:        Mood and Affect: Mood normal.        Behavior: Behavior normal.        Thought Content: Thought content normal.        Judgment: Judgment normal.          Assessment & Plan:   Problem List Items Addressed This Visit   None Visit Diagnoses       Anxiety and depression    -  Primary   Relevant Orders   Ambulatory referral to Behavioral Health       Assessment & Plan Depressive and anxiety disorders Chronic depressive and anxiety disorders with irritability and verbal aggression. Alcohol may exacerbate symptoms and interact with fluoxetine . No recent panic attacks.  No significant cognitive decline. Very pleasant.  - Continue fluoxetine  60 mg daily. Prefer not to increase given age and alcohol use.  - Use hydroxyzine  as needed for anxiety, avoid scheduled use. - Referred to counseling - Encouraged  reduction of alcohol, educated on safety and risk for interaction with current meds - Encouraged regular physical activity to help boost mood and increase strength - they are planning to look in trainer options at the [facility]. - Encouraged balanced diet and hydration.       Follow-up: Return if symptoms worsen or fail to improve, for /  keep upcoming PCP appointment .   Waddell FURY Almarie, DNP, FNP-C  I,Emily Lagle,acting as a neurosurgeon for Waddell KATHEE Almarie, NP.,have documented all relevant documentation on the behalf of Waddell KATHEE Almarie, NP.   I, Waddell KATHEE Almarie, NP, have reviewed all documentation for this visit. The documentation on 08/14/2024 for the exam, diagnosis, procedures, and orders are all accurate and complete.

## 2024-08-15 NOTE — Progress Notes (Signed)
 Remote PPM Transmission

## 2024-08-27 ENCOUNTER — Telehealth: Payer: Self-pay | Admitting: Pharmacist

## 2024-08-27 NOTE — Progress Notes (Signed)
 Pharmacy Quality Measure Review  This patient is appearing on a report for being at risk of failing the adherence measure for cholesterol (statin) medications this calendar year.   Medication: rosuvastatin   Last fill date: 05/17/2024 for 90 day supply per adherence report  Reviewed recent refill history in Dr Annemarie database. Actual last refill date was 08/11/2024 for 90 day supply. Patient has 1 refill remaining. Next appointment with PCP is 10/01/2024.    Insurance report was not up to date. No action needed at this time.   Madelin Ray, PharmD Clinical Pharmacist [facility] Primary Care  Population Health [phone]

## 2024-08-29 ENCOUNTER — Encounter

## 2024-08-31 ENCOUNTER — Other Ambulatory Visit: Payer: Self-pay | Admitting: Internal Medicine

## 2024-09-03 ENCOUNTER — Telehealth: Payer: Self-pay | Admitting: Internal Medicine

## 2024-09-03 DIAGNOSIS — E119 Type 2 diabetes mellitus without complications: Secondary | ICD-10-CM

## 2024-09-03 DIAGNOSIS — I480 Paroxysmal atrial fibrillation: Secondary | ICD-10-CM

## 2024-09-03 NOTE — Telephone Encounter (Signed)
 Pt dropped off a paper with information he wants dr.paz to look over. He wants provider to call him to advise. paper left in providers box.

## 2024-09-03 NOTE — Telephone Encounter (Signed)
 Pt dropped off letter, placed in PCP red folder for review.

## 2024-09-04 NOTE — Telephone Encounter (Signed)
 Patient is concerned about polypharmacy. Please call patient: I placed a referral for our clinical pharmacist We can discuss all the medication he takes at the next visit which is a scheduled for January 27,

## 2024-09-04 NOTE — Addendum Note (Signed)
 Addended by: AMON SCHANZ E on: 09/04/2024 12:04 PM   Modules accepted: Orders

## 2024-09-09 NOTE — Telephone Encounter (Signed)
 Patient notified of this information.

## 2024-09-11 ENCOUNTER — Ambulatory Visit: Attending: Cardiology

## 2024-09-12 ENCOUNTER — Encounter

## 2024-09-12 LAB — CUP PACEART REMOTE DEVICE CHECK
Battery Remaining Longevity: 3 mo
Battery Voltage: 2.83 V
Brady Statistic AP VP Percent: 0.13 %
Brady Statistic AP VS Percent: 96.49 %
Brady Statistic AS VP Percent: 0 %
Brady Statistic AS VS Percent: 3.38 %
Brady Statistic RA Percent Paced: 96.23 %
Brady Statistic RV Percent Paced: 0.2 %
Date Time Interrogation Session: 20260107145753
Implantable Lead Connection Status: 753985
Implantable Lead Connection Status: 753985
Implantable Lead Implant Date: 20150827
Implantable Lead Implant Date: 20150827
Implantable Lead Location: 753859
Implantable Lead Location: 753860
Implantable Lead Model: 5076
Implantable Lead Model: 5076
Implantable Pulse Generator Implant Date: 20150827
Lead Channel Impedance Value: 323 Ohm
Lead Channel Impedance Value: 342 Ohm
Lead Channel Impedance Value: 418 Ohm
Lead Channel Impedance Value: 475 Ohm
Lead Channel Pacing Threshold Amplitude: 0.625 V
Lead Channel Pacing Threshold Amplitude: 0.75 V
Lead Channel Pacing Threshold Pulse Width: 0.4 ms
Lead Channel Pacing Threshold Pulse Width: 0.4 ms
Lead Channel Sensing Intrinsic Amplitude: 12 mV
Lead Channel Sensing Intrinsic Amplitude: 12 mV
Lead Channel Sensing Intrinsic Amplitude: 2.125 mV
Lead Channel Sensing Intrinsic Amplitude: 2.125 mV
Lead Channel Setting Pacing Amplitude: 1.5 V
Lead Channel Setting Pacing Amplitude: 2 V
Lead Channel Setting Pacing Pulse Width: 0.4 ms
Lead Channel Setting Sensing Sensitivity: 0.9 mV
Zone Setting Status: 755011
Zone Setting Status: 755011

## 2024-09-13 ENCOUNTER — Telehealth: Payer: Self-pay

## 2024-09-13 NOTE — Progress Notes (Unsigned)
 Complex Care Management Care Guide Note  09/13/2024 Name: Lawrence [name]. MRN: [id number] DOB: 1951-06-07  Lawrence [name]. is a 74 y.o. year old male who is a primary care patient of Amon Aloysius BRAVO, MD. I reached out to Lawrence [name]. by phone today to assist with scheduling  with the Pharmacist.  Follow up plan: Pending time confirmation per [facility].  Lawrence [name] [facility]  [facility], [facility] VBCI Assistant Direct Dial: [phone]  Fax: [phone]

## 2024-09-13 NOTE — Progress Notes (Unsigned)
 Complex Care Management Note Care Guide Note  09/13/2024 Name: Lawrence [name]. MRN: [id number] DOB: 1951/02/19   Complex Care Management Outreach Attempts: A second unsuccessful outreach was attempted today to offer the patient with information about available complex care management services.  Follow Up Plan:  Additional outreach attempts will be made to offer the patient complex care management information and services.   Encounter Outcome:  No Answer  Dreama [facility]  [facility], [facility] VBCI Assistant Direct Dial: [phone]  Fax: [phone]

## 2024-09-16 ENCOUNTER — Other Ambulatory Visit

## 2024-09-16 DIAGNOSIS — E119 Type 2 diabetes mellitus without complications: Secondary | ICD-10-CM

## 2024-09-16 DIAGNOSIS — F32A Depression, unspecified: Secondary | ICD-10-CM

## 2024-09-16 DIAGNOSIS — I480 Paroxysmal atrial fibrillation: Secondary | ICD-10-CM

## 2024-09-16 DIAGNOSIS — Z79899 Other long term (current) drug therapy: Secondary | ICD-10-CM

## 2024-09-16 LAB — OPHTHALMOLOGY REPORT-SCANNED

## 2024-09-16 NOTE — Progress Notes (Signed)
 Complex Care Management Note  Care Guide Note 09/16/2024 Name: Lawrence [name]. MRN: [id number] DOB: 1951/09/02  Lawrence [name]. is a 74 y.o. year old male who sees Amon Aloysius BRAVO, MD for primary care. I reached out to Lawrence [name]. by phone today to offer complex care management services.  Lawrence [name] was given information about Complex Care Management services today including:   The Complex Care Management services include support from the care team which includes your Nurse Care Manager, Clinical Social Worker, or Pharmacist.  The Complex Care Management team is here to help remove barriers to the health concerns and goals most important to you. Complex Care Management services are voluntary, and the patient may decline or stop services at any time by request to their care team member.   Complex Care Management Consent Status: Patient agreed to services and verbal consent obtained.   Follow up plan:  Telephone appointment with complex care management team member scheduled for:  09/16/24 at 4:00 p.m.   Encounter Outcome:  Patient Scheduled  Dreama [facility]  [facility], [facility] VBCI Assistant Direct Dial: [phone]  Fax: [phone]

## 2024-09-16 NOTE — Progress Notes (Unsigned)
 "  09/16/2024 Name: Lawrence [name]. MRN: [id number] DOB: 05/18/1951  Chief Complaint  Patient presents with   Medication Management    Lawrence [name]. is a 74 y.o. year old male who presented for a telephone visit.   They were referred to the pharmacist by their PCP for assistance in managing complex medication management.    Subjective:  Care Team: Primary Care Provider: Amon Aloysius BRAVO, MD ; Next Scheduled Visit: 10/01/2024 Endocrinologist Dr Mercie; Next Scheduled Visit: 11/27/2024  Medication Access/Adherence  Current Pharmacy:  [id number] - [place], [place] - [address] [place] [address] Phone: [phone] Fax: [phone]   Patient reports affordability concerns with their medications: No  Patient reports access/transportation concerns to their pharmacy: No  Patient reports adherence concerns with their medications:  Yes  - patient was prescribed 3 new medications in December, Jardiance , losartan  and omeprazole but after taking them for 3 weeks he stopped because he experience nausea and was not sure which medication was causing the nausea. Since stopping Jardiance , losartan  and omeprazole his nausea has subsided.   Patient also mentions he is worried that his fluoxetine  is not as effective as it used to be. He has retired in the last year and has noticed that he angers quickly. He is currently taking fluoxetine  60mg  daily and hydroxyzine  25mg  up to twice a day for anxiety and insomnia. He reports that he take hydroxyzine  about 3 or 4 times per week. He was previously on lorazepam  at night to help with sleep but feels that the hydroxyzine  works just as well for sleep. Lawrence [name] will meet with a behavioral health counselor this week to discuss his anger concerns.    Diabetes: Managed by endocrinology - Dr Mercie  Current medications: Novolog  70/30 - injects 20 units each morning and 6 units each evening per patient but it looks  like when he started Jardiance  Dr Mercie has advised patient to discontinue evening dose of Novolog  70/30  Jardiance  25mg  daily - not currently taking because he was not sure if it has caused nausea  Patient reports occasional hypoglycemic s/sx including shakiness, sweating but he is usually alerted of upcoming lows by his Continuous Glucose Monitor before he has symptoms. Patient denies hyperglycemic symptoms including no polyuria, polydipsia, polyphagia, nocturia, neuropathy, blurred vision.  Macrovascular and Microvascular Risk Reduction:  Statin? yes (rosuvastatin ); ACEi/ARB? yes (losartan  started 08/27/2024 but patient stopped due to nausea) Last urinary albumin/creatinine ratio:  Lab Results  Component Value Date   MICRALBCREAT 218.3 (H) 05/28/2024   MICRALBCREAT 63 (H) 05/14/2024   Patient has been referred to nephrology 08/2024  Last eye exam:  Lab Results  Component Value Date   HMDIABEYEEXA Retinopathy (A) 09/16/2024   Last foot exam: 05/28/2024 Tobacco Use:  Tobacco Use: Low Risk (08/14/2024)   Patient History    Smoking Tobacco Use: Never    Smokeless Tobacco Use: Never    Passive Exposure: Past   Atrial Fibrillation:  Current medications:  Rate Control: metoprolol  25mg  twice a day  Anticoagulation Regimen: Eliquis  5mg  twice a day  CHADS2VASC: 6   Objective:  BP Readings from Last 3 Encounters:  08/14/24 93/60  08/13/24 116/60  07/30/24 104/68     Lab Results  Component Value Date   HGBA1C 6.7 (A) 08/13/2024    Lab Results  Component Value Date   CREATININE 1.1 07/02/2024   BUN 14 07/02/2024   NA 139 07/02/2024   K 4.7 07/02/2024   CL  100 07/02/2024   CO2 30 (A) 07/02/2024    Lab Results  Component Value Date   CHOL 120 05/28/2024   HDL 52.90 05/28/2024   LDLCALC 44 05/28/2024   TRIG 115.0 05/28/2024   CHOLHDL 2 05/28/2024    Medications Reviewed Today     Reviewed by Lawrence [name], Lawrence [name] (Pharmacist) on 09/16/24 at 1653  Med  List Status: <None>   Medication Order Taking? Sig Documenting Provider Last Dose Status Informant  acetaminophen  (TYLENOL ) 500 MG tablet [id number] Yes Take 500-1,000 mg by mouth every 6 (six) hours as needed (pain.). [provider]  Active Self  Alcohol Swabs ([facility] PREP) 70 % PADS [id number] Yes USE AS DIRECTED Paz, Jose E, MD  Active   budesonide -formoterol  (SYMBICORT ) 160-4.5 MCG/ACT inhaler [id number] Yes Inhale 2 puffs into the lungs 2 (two) times daily.  Patient taking differently: Inhale 2 puffs into the lungs 2 (two) times daily as needed (for shortness of breath / bronchitis).   [provider]  Active   Continuous Glucose Sensor (FREESTYLE LIBRE 2 PLUS SENSOR) MISC [id number] Yes Inject 1 each into the skin as directed. Change sensor every 15 days Thapa, Sudan, MD  Active   cyanocobalamin  (VITAMIN B12) 1000 MCG tablet [id number] Yes Take 1,000 mcg by mouth every evening. [provider]  Active Self  ELIQUIS  5 MG TABS tablet [id number] Yes TAKE 1 TABLET BY MOUTH TWICE A DAY Crenshaw, Redell RAMAN, MD  Active   empagliflozin  (JARDIANCE ) 25 MG TABS tablet [id number]  Take 1 tablet (25 mg total) by mouth daily before breakfast.  Patient not taking: Reported on 09/16/2024   Thapa, Sudan, MD  Active   ferrous sulfate 325 (65 FE) MG tablet [id number] Yes Take 325 mg by mouth every evening. [provider]  Active Self  FLUoxetine  (PROZAC ) 20 MG tablet [id number] Yes Take 3 tablets (60 mg total) by mouth daily. Paz, Jose E, MD  Active   hydrOXYzine  (ATARAX ) 25 MG tablet [id number] Yes Take 1 tablet (25 mg total) by mouth 2 (two) times daily as needed for anxiety (Insomnia). Paz, Jose E, MD  Active   insulin  aspart protamine  - aspart (NOVOLOG  MIX 70/30 FLEXPEN) (70-30) 100 UNIT/ML FlexPen [id number]  Inject 20 Units into the skin daily with breakfast.  Patient not taking: Reported on 09/16/2024   Thapa, Sudan, MD  Active   Insulin  Pen Needle 32G X 4 MM MISC [id number]  Daily.   Patient not taking: Reported on 09/16/2024   Thapa, Sudan, MD  Active   Insulin  Syringe-Needle U-100 (BD VEO INSULIN  SYRINGE U/F) 31G X 15/64 0.3 ML MISC [id number] Yes Use as directed in the morning, at noon, in the evening, and at bedtime. Amon Aloysius BRAVO, MD  Active Self  losartan  (COZAAR ) 25 MG tablet [id number]  Take 0.5 tablets (12.5 mg total) by mouth daily.  Patient not taking: Reported on 09/16/2024   Thapa, Sudan, MD  Active   metoprolol  tartrate (LOPRESSOR ) 25 MG tablet [id number] Yes Take 1 tablet (25 mg total) by mouth 2 (two) times daily. West, Katlyn D, NP  Active Self  Multiple Vitamins-Minerals (PRESERVISION AREDS PO) [id number] Yes Take 1 tablet by mouth in the morning and at bedtime. Preservision AREDS [provider]  Active Self  omeprazole (PRILOSEC) 40 MG capsule [id number]  Take 40 mg by mouth every morning.  Patient not taking: Reported on 09/16/2024   [provider]  Active   rosuvastatin  (CRESTOR ) 40 MG tablet [id number] Yes TAKE 1 TABLET BY MOUTH  EVERY DAY Douglass Kenney NOVAK, FNP  Active   UNABLE TO FIND [id number] Yes Take 1,200 mg by mouth in the morning and at bedtime. Med Name: Lamonte Gummies  Patient taking differently: Take 1,200 mg by mouth at bedtime. Med Name: Xite Gummies   [provider]  Active               Assessment/Plan:   Diabetes: - Currently controlled; goal A1c <7%. Cardiorenal risk reduction is opportunities for improvement.. Blood pressure is at goal <130/80. LDL is at goal.  - Reviewed goal A1c, goal fasting, and goal 2 hour post prandial glucose. Recommended to check glucose continuously with [facility] sensor - Recommended he retry Jardiance  25mg  daily due to cardiorenal benefits and lowering of blood glucose. Reminded him that Dr Mercie recommended stopping evening dose of Humalog 70/30 when he started Jardiance . I would also recommend he consider retrial of losartan  in the future but would hold off until he knows he can  tolerate Jardiance  and also to check blood pressure again before starting since last blood pressure in office was low.   Atrial Fibrillation: - Currently controlled - Reviewed importance of adherence to anticoagulant for stroke prevention. - Recommend to continue metoprolol  and Eliquis    Medication Management: - recommended he hold omeprazole. He can try over-the-counter Pepcid  10mg  up to twice a day for reflux. If this is not helpful, then can change to other PPI like pantoprazole .  - If alternatives to fluoxetine  are needed - could consider sertraline or escitalopram as they have been beneficial for some patients with anxiety / difficulty managing anger. Fluoxetine  has a long half life so would not need to taper if switching to either of these SSRIs    Madelin Ray, PharmD Clinical Pharmacist [facility]    "

## 2024-09-17 ENCOUNTER — Ambulatory Visit: Admitting: Licensed Clinical Social Worker

## 2024-09-17 DIAGNOSIS — F4325 Adjustment disorder with mixed disturbance of emotions and conduct: Secondary | ICD-10-CM
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
## 2024-09-19 ENCOUNTER — Encounter: Payer: Self-pay | Admitting: Pharmacist

## 2024-09-19 MED ORDER — FAMOTIDINE 10 MG PO TABS: 10.0000 mg | ORAL_TABLET | Freq: Two times a day (BID) | ORAL | Status: AC

## 2024-09-22 NOTE — Progress Notes (Unsigned)
" °  Electrophysiology Office Note:   Date:  09/22/2024  ID:  Lawrence [name]., DOB [birth year], MRN [id number]  Primary Cardiologist: Redell Shallow, MD Primary Heart Failure: None Electrophysiologist: Will Gladis Norton, MD   {Click to update primary MD,subspecialty MD or APP then REFRESH:1}    History of Present Illness:   Lawrence [name]. is a 74 y.o. male with h/o AF, SSS s/p PPM, HTN, CAD, TIA, GERD, esophageal stenosis, IBS, DM II, anemia, anxiety seen today for routine electrophysiology followup.   Since last being seen in our clinic the patient reports doing ***.    He ***denies chest pain, palpitations, dyspnea, PND, orthopnea, nausea, vomiting, dizziness, syncope, edema, weight gain, or early satiety.   Review of systems complete and found to be negative unless listed in HPI.    EP Information / Studies Reviewed:    EKG is not ordered today. EKG from 07/30/24 reviewed which showed AP 74 bpm      PPM Interrogation-  reviewed in detail today,  See PACEART report.  Arrhythmia/Device History: Medtronic Dual Chamber PPM implanted 05/01/14 for Sinus Node Dysfunction   Arrhythmia / AAD / Pertinent EP Studies AF s/p ablation 2017 [facility] 01/31/24 EPS 05/01/24 > SR on presentation, PF ablation of PV's + PW  Physical Exam:   VS:  There were no vitals taken for this visit.   Wt Readings from Last 3 Encounters:  08/14/24 229 lb (103.9 kg)  08/13/24 231 lb (104.8 kg)  07/30/24 221 lb (100.2 kg)     GEN: Well nourished, well developed in no acute distress NECK: No JVD; No carotid bruits CARDIAC: {EPRHYTHM:28826}, no murmurs, rubs, gallops RESPIRATORY:  Clear to auscultation without rales, wheezing or rhonchi  ABDOMEN: Soft, non-tender, non-distended EXTREMITIES:  No edema; No deformity   ASSESSMENT AND PLAN:    SND s/p Medtronic PPM  -Normal PPM function -See Pace Art report -No changes today -ERI? ***   Persistent Atrial Fibrillation  CHA2DS2-VASc 6, s/p  PVI + PW ablation 04/2024 -OAC for stroke prophylaxis  -continue metoprolol  tartrate 25 mg BID -no symptom burden ***  -***% on device   Secondary Hypercoagulable State  -continue Eliquis  5mg  BID, dose reviewed and appropriate by age / wt   CAD  -no anginal symptoms ***  Disposition:   Follow up with {EPPROVIDERS:28135::EP Team} {EPFOLLOW LE:71826}  Signed, Daphne Barrack, NP-C, AGACNP-BC [facility] - Electrophysiology  09/22/2024, 10:33 AM  "

## 2024-09-24 ENCOUNTER — Ambulatory Visit: Payer: Self-pay | Admitting: Cardiology

## 2024-09-24 ENCOUNTER — Encounter: Payer: Self-pay | Admitting: Pulmonary Disease

## 2024-09-24 ENCOUNTER — Ambulatory Visit: Attending: Pulmonary Disease | Admitting: Cardiology

## 2024-09-24 VITALS — BP 110/68 | HR 86 | Ht 73.0 in | Wt 224.8 lb

## 2024-09-24 DIAGNOSIS — R001 Bradycardia, unspecified: Secondary | ICD-10-CM

## 2024-09-24 DIAGNOSIS — I4819 Other persistent atrial fibrillation: Secondary | ICD-10-CM | POA: Diagnosis not present

## 2024-09-24 DIAGNOSIS — Z950 Presence of cardiac pacemaker: Secondary | ICD-10-CM

## 2024-09-24 DIAGNOSIS — D6869 Other thrombophilia: Secondary | ICD-10-CM | POA: Diagnosis not present

## 2024-09-24 LAB — CUP PACEART INCLINIC DEVICE CHECK
Battery Remaining Longevity: 3 mo
Battery Voltage: 2.83 V
Brady Statistic AP VP Percent: 0.11 %
Brady Statistic AP VS Percent: 97 %
Brady Statistic AS VP Percent: 0 %
Brady Statistic AS VS Percent: 2.89 %
Brady Statistic RA Percent Paced: 96.65 %
Brady Statistic RV Percent Paced: 0.15 %
Date Time Interrogation Session: 20260120090118
Implantable Lead Connection Status: 753985
Implantable Lead Connection Status: 753985
Implantable Lead Implant Date: 20150827
Implantable Lead Implant Date: 20150827
Implantable Lead Location: 753859
Implantable Lead Location: 753860
Implantable Lead Model: 5076
Implantable Lead Model: 5076
Implantable Pulse Generator Implant Date: 20150827
Lead Channel Impedance Value: 361 Ohm
Lead Channel Impedance Value: 380 Ohm
Lead Channel Impedance Value: 456 Ohm
Lead Channel Impedance Value: 532 Ohm
Lead Channel Pacing Threshold Amplitude: 0.625 V
Lead Channel Pacing Threshold Amplitude: 0.875 V
Lead Channel Pacing Threshold Pulse Width: 0.4 ms
Lead Channel Pacing Threshold Pulse Width: 0.4 ms
Lead Channel Sensing Intrinsic Amplitude: 14.25 mV
Lead Channel Sensing Intrinsic Amplitude: 16.25 mV
Lead Channel Sensing Intrinsic Amplitude: 3 mV
Lead Channel Sensing Intrinsic Amplitude: 3.5 mV
Lead Channel Setting Pacing Amplitude: 1.5 V
Lead Channel Setting Pacing Amplitude: 2 V
Lead Channel Setting Pacing Pulse Width: 0.4 ms
Lead Channel Setting Sensing Sensitivity: 0.9 mV
Zone Setting Status: 755011
Zone Setting Status: 755011

## 2024-09-24 LAB — CBC

## 2024-09-24 NOTE — Patient Instructions (Addendum)
 Medication Instructions:  None  *If you need a refill on your cardiac medications before your next appointment, please call your pharmacy*  Lab Work: Today:  CBC, BMET. If you have labs (blood work) drawn today and your tests are completely normal, you will receive your results only by: MyChart Message (if you have MyChart) OR A paper copy in the mail If you have any lab test that is abnormal or we need to change your treatment, we will call you to review the results.  Testing/Procedures: PPM Generator change on 10/10/24.  Please see attached letter.  Follow-Up: At [facility], you and your health needs are our priority.  As part of our continuing mission to provide you with exceptional heart care, our providers are all part of one team.  This team includes your primary Cardiologist (physician) and Advanced Practice Providers or APPs (Physician Assistants and Nurse Practitioners) who all work together to provide you with the care you need, when you need it.  Your next appointment:    Will be scheduled after your  procedure.  Provider:   Soyla Norton, MD    We recommend signing up for the patient portal called MyChart.  Sign up information is provided on this After Visit Summary.  MyChart is used to connect with patients for Virtual Visits (Telemedicine).  Patients are able to view lab/test results, encounter notes, upcoming appointments, etc.  Non-urgent messages can be sent to your provider as well.   To learn more about what you can do with MyChart, go to [email or web address].   Other Instructions

## 2024-09-24 NOTE — Progress Notes (Signed)
" °  Electrophysiology Office Note:   ID:  Lawrence [name]., DOB 1950/12/15, MRN [id number]  Primary Cardiologist: Redell Shallow, MD Electrophysiologist: Soyla Gladis Norton, MD      History of Present Illness:   Lawrence Catena. is a 74 y.o. male with h/o  CAD, atrial fibrillation s/p ablation, symptomatic bradycardia s/p PPM, TIA, HTN, orthostatic hypotension, HLD, DM seen today for routine electrophysiology followup.   S/p PVI and posterior wall ablation with Dr. Norton 05/01/24. Continues to feel well, denies any palpitations or symptoms suggestive of recurrent arrhythmia.   Since last being seen in our clinic the patient reports doing well. Is fighting off the remnants of bronchitis.  he denies chest pain, palpitations, dyspnea, PND, orthopnea, nausea, vomiting, dizziness, syncope, edema, weight gain, or early satiety.   Patient continues to be interested in [place] closure after pending generator change out.   Review of systems complete and found to be negative unless listed in HPI.   EP Information / Studies Reviewed:    EKG is not ordered today. EKG from 07/30/24 reviewed which showed atrial paced rhythm, non-specific ST abnormality.       PPM Interrogation-  reviewed in detail today,  See PACEART report.  Arrhythmia/Device History No specialty comments available.   Physical Exam:   VS:  BP 110/68   Pulse 86   Ht 6' 1 (1.854 m)   Wt 224 lb 12.8 oz (102 kg)   SpO2 97%   BMI 29.66 kg/m    Wt Readings from Last 3 Encounters:  09/24/24 224 lb 12.8 oz (102 kg)  08/14/24 229 lb (103.9 kg)  08/13/24 231 lb (104.8 kg)     GEN: No acute distress  NECK: No JVD; No carotid bruits CARDIAC: Regular rate and rhythm, no murmurs, rubs, gallops RESPIRATORY:  Clear to auscultation without rales, wheezing or rhonchi  ABDOMEN: Soft, non-tender, non-distended EXTREMITIES:  No edema; No deformity   ASSESSMENT AND PLAN:    Symptomatic bradycardia s/p Medtronic PPM  Normal  PPM function with stable lead measurements. See Pace Art report No changes today Device is at RRT by voltage. Will schedule for generator changeout. Soap and letters provided.  Persistent atrial fibrillation Secondary hypercoagulable state Patient s/p PVI and posterior wall ablation with Dr. Norton 05/01/24. Post ablation device checks without evidence of recurrent afib.  Continue Metoprolol  Tartrate 25mg  BID Continue Eliquis  5mg  BID. Patient remains very interested in [place] closure citing fall risk. Will plan to discuss at 90 day follow up post generator change out. Patient needs both endoscopy and colonoscopy, discussed that these would need to take place prior to [facility] closure.  Patient now 3 months out from ablation, okay to have procedures that require brief holds of his OAC.   Coronary Artery Disease No anginal symptoms per patient. Continue Crestor  per general cardiology.    Hypertension Orthostasis BP stable today. Patient reports orthostatic symptoms are well managed.       Disposition:   Follow up with EP Team as usual post procedure  Signed, Artist Pouch, PA-C  "

## 2024-09-24 NOTE — H&P (View-Only) (Signed)
" °  Electrophysiology Office Note:   ID:  Lawrence [name]., DOB 1950/12/15, MRN [id number]  Primary Cardiologist: Redell Shallow, MD Electrophysiologist: Soyla Gladis Norton, MD      History of Present Illness:   Lawrence [name]. is a 74 y.o. male with h/o  CAD, atrial fibrillation s/p ablation, symptomatic bradycardia s/p PPM, TIA, HTN, orthostatic hypotension, HLD, DM seen today for routine electrophysiology followup.   S/p PVI and posterior wall ablation with Dr. Norton 05/01/24. Continues to feel well, denies any palpitations or symptoms suggestive of recurrent arrhythmia.   Since last being seen in our clinic the patient reports doing well. Is fighting off the remnants of bronchitis.  he denies chest pain, palpitations, dyspnea, PND, orthopnea, nausea, vomiting, dizziness, syncope, edema, weight gain, or early satiety.   Patient continues to be interested in Lawrence [name] after pending generator change out.   Review of systems complete and found to be negative unless listed in HPI.   EP Information / Studies Reviewed:    EKG is not ordered today. EKG from 07/30/24 reviewed which showed atrial paced rhythm, non-specific ST abnormality.       PPM Interrogation-  reviewed in detail today,  See PACEART report.  Arrhythmia/Device History No specialty comments available.   Physical Exam:   VS:  BP 110/68   Pulse 86   Ht 6' 1 (1.854 m)   Wt 224 lb 12.8 oz (102 kg)   SpO2 97%   BMI 29.66 kg/m    Wt Readings from Last 3 Encounters:  09/24/24 224 lb 12.8 oz (102 kg)  08/14/24 229 lb (103.9 kg)  08/13/24 231 lb (104.8 kg)     GEN: No acute distress  NECK: No JVD; No carotid bruits CARDIAC: Regular rate and rhythm, no murmurs, rubs, gallops RESPIRATORY:  Clear to auscultation without rales, wheezing or rhonchi  ABDOMEN: Soft, non-tender, non-distended EXTREMITIES:  No edema; No deformity   ASSESSMENT AND PLAN:    Symptomatic bradycardia s/p Medtronic PPM  Normal  PPM function with stable lead measurements. See Pace Art report No changes today Device is at RRT by voltage. Will schedule for generator changeout. Soap and letters provided.  Persistent atrial fibrillation Secondary hypercoagulable state Patient s/p PVI and posterior wall ablation with Dr. Norton 05/01/24. Post ablation device checks without evidence of recurrent afib.  Continue Metoprolol  Tartrate 25mg  BID Continue Eliquis  5mg  BID. Patient remains very interested in Lawrence [name] citing fall risk. Will plan to discuss at 90 day follow up post generator change out. Patient needs both endoscopy and colonoscopy, discussed that these would need to take place prior to Lawrence [name] [name].  Patient now 3 months out from ablation, okay to have procedures that require brief holds of his OAC.   Coronary Artery Disease No anginal symptoms per patient. Continue Crestor  per general cardiology.    Hypertension Orthostasis BP stable today. Patient reports orthostatic symptoms are well managed.       Disposition:   Follow up with EP Team as usual post procedure  Signed, Artist Pouch, PA-C  "

## 2024-09-25 ENCOUNTER — Encounter (HOSPITAL_COMMUNITY): Payer: Self-pay

## 2024-09-25 LAB — CBC
Hematocrit: 47 % (ref 37.5–51.0)
Hemoglobin: 15.4 g/dL (ref 13.0–17.7)
MCH: 32 pg (ref 26.6–33.0)
MCHC: 32.8 g/dL (ref 31.5–35.7)
MCV: 98 fL — AB (ref 79–97)
Platelets: 213 x10E3/uL (ref 150–450)
RBC: 4.81 x10E6/uL (ref 4.14–5.80)
RDW: 12.7 % (ref 11.6–15.4)
WBC: 9.5 x10E3/uL (ref 3.4–10.8)

## 2024-09-25 LAB — BASIC METABOLIC PANEL WITH GFR
BUN/Creatinine Ratio: 11 (ref 10–24)
BUN: 14 mg/dL (ref 8–27)
CO2: 22 mmol/L (ref 20–29)
Calcium: 9.5 mg/dL (ref 8.6–10.2)
Chloride: 101 mmol/L (ref 96–106)
Creatinine, Ser: 1.27 mg/dL (ref 0.76–1.27)
Glucose: 152 mg/dL — ABNORMAL HIGH (ref 70–99)
Potassium: 5.2 mmol/L (ref 3.5–5.2)
Sodium: 139 mmol/L (ref 134–144)
eGFR: 60 mL/min/1.73

## 2024-09-26 ENCOUNTER — Telehealth (HOSPITAL_COMMUNITY): Payer: Self-pay

## 2024-09-26 NOTE — Telephone Encounter (Signed)
 Spoke with patient to discuss upcoming procedure.   Confirmed patient is scheduled for a PPM generator change on Thursday, February 5 with Dr. Dr. Inocencio. Instructed patient to arrive at the Main Entrance A at [facility]: [address] [place], [place] [address] and check in at Admitting at 1:30 PM.   Labs completed  Any recent signs of acute illness or been started on antibiotics? No  Any new medications started? No Any medications to hold? Yes  HOLD Jardiance  for 3 days prior to your procedure. Last dose on February 1. HOLD: Eliquis  (Apixaban ) for 2 days prior to your procedure. Last dose on Monday, February 2, PM dose.   Novolog  70/30 Insulin : If you take at Dinner/Bedtime - ONLY take 70% (3.5 units) of your usual dose the night before your procedure. If you take in the Morning - DO NOT take the morning of your procedure.  You may have a LIGHT breakfast prior to 7:00am on the morning of your procedure. Nothing to eat or drink after 7:00am except for sips of water  with medications not discussed.    The night before your procedure and the morning of your procedure, wash thoroughly with the CHG surgical soap from the neck down, paying special attention to the area where your procedure will be performed.  Plan to go home the same day, you will only stay overnight if medically necessary. You MUST have a responsible adult to drive you home and MUST be with you the first 24 hours after you arrive home.  Informed patient a nurse will call a day before the procedure to confirm arrival time and ensure instructions are followed.  Patient verbalized understanding to all instructions provided and agreed to proceed with procedure.

## 2024-10-01 ENCOUNTER — Encounter: Admitting: Internal Medicine

## 2024-10-04 ENCOUNTER — Other Ambulatory Visit: Payer: Self-pay | Admitting: Internal Medicine

## 2024-10-08 ENCOUNTER — Ambulatory Visit: Admitting: Licensed Clinical Social Worker

## 2024-10-09 NOTE — Pre-Procedure Instructions (Signed)
 Attempted to call patient regarding procedure instructions.  Left voicemail on the following items: Arrival time 1:00- new arrival time Ok to have light breakfast, nothing after 7am No meds AM of procedure Responsible person to drive you home and stay with you for 24 hrs Wash with special soap night before and morning of procedure If on anti-coagulant drug instructions Eliquis - last dose should have been 2/2

## 2024-10-10 ENCOUNTER — Encounter (HOSPITAL_COMMUNITY): Admission: RE | Disposition: A | Payer: Self-pay | Source: Home / Self Care | Attending: Cardiology

## 2024-10-10 ENCOUNTER — Ambulatory Visit (HOSPITAL_COMMUNITY)
Admission: RE | Admit: 2024-10-10 | Discharge: 2024-10-10 | Disposition: A | Attending: Cardiology | Admitting: Cardiology

## 2024-10-10 ENCOUNTER — Encounter (HOSPITAL_COMMUNITY): Payer: Self-pay | Admitting: Cardiology

## 2024-10-10 ENCOUNTER — Other Ambulatory Visit: Payer: Self-pay

## 2024-10-10 DIAGNOSIS — Z45010 Encounter for checking and testing of cardiac pacemaker pulse generator [battery]: Secondary | ICD-10-CM | POA: Insufficient documentation

## 2024-10-10 DIAGNOSIS — R001 Bradycardia, unspecified: Secondary | ICD-10-CM | POA: Insufficient documentation

## 2024-10-10 DIAGNOSIS — I2510 Atherosclerotic heart disease of native coronary artery without angina pectoris: Secondary | ICD-10-CM | POA: Insufficient documentation

## 2024-10-10 DIAGNOSIS — Z7901 Long term (current) use of anticoagulants: Secondary | ICD-10-CM | POA: Insufficient documentation

## 2024-10-10 DIAGNOSIS — D6869 Other thrombophilia: Secondary | ICD-10-CM | POA: Insufficient documentation

## 2024-10-10 DIAGNOSIS — I4819 Other persistent atrial fibrillation: Secondary | ICD-10-CM | POA: Insufficient documentation

## 2024-10-10 DIAGNOSIS — I10 Essential (primary) hypertension: Secondary | ICD-10-CM | POA: Insufficient documentation

## 2024-10-10 LAB — GLUCOSE, CAPILLARY
Glucose-Capillary: 168 mg/dL — ABNORMAL HIGH (ref 70–99)
Glucose-Capillary: 173 mg/dL — ABNORMAL HIGH (ref 70–99)

## 2024-10-10 MED ORDER — SODIUM CHLORIDE 0.9 % IV SOLN
80.0000 mg | INTRAVENOUS | Status: AC
  Administered 2024-10-10: 80 mg

## 2024-10-10 MED ORDER — CHLORHEXIDINE GLUCONATE 4 % EX SOLN
4.0000 | Freq: Once | CUTANEOUS | Status: DC
  Filled 2024-10-10: qty 60

## 2024-10-10 MED ORDER — SODIUM CHLORIDE 0.9 % IV SOLN: INTRAVENOUS | Status: DC

## 2024-10-10 MED ORDER — MIDAZOLAM HCL 2 MG/2ML IJ SOLN
INTRAMUSCULAR | Status: AC
  Filled 2024-10-10: qty 2

## 2024-10-10 MED ORDER — ONDANSETRON HCL 4 MG/2ML IJ SOLN: 4.0000 mg | Freq: Four times a day (QID) | INTRAMUSCULAR | Status: DC | PRN

## 2024-10-10 MED ORDER — MIDAZOLAM HCL 5 MG/5ML IJ SOLN
INTRAMUSCULAR | Status: DC | PRN
  Administered 2024-10-10: 1 mg via INTRAVENOUS

## 2024-10-10 MED ORDER — ACETAMINOPHEN 325 MG PO TABS: 325.0000 mg | ORAL_TABLET | ORAL | Status: DC | PRN

## 2024-10-10 MED ORDER — SODIUM CHLORIDE 0.9 % IV SOLN
INTRAVENOUS | Status: AC
  Filled 2024-10-10: qty 2

## 2024-10-10 MED ORDER — POVIDONE-IODINE 10 % EX SWAB: 2.0000 | Freq: Once | CUTANEOUS | Status: DC

## 2024-10-10 MED ORDER — FENTANYL CITRATE (PF) 100 MCG/2ML IJ SOLN
INTRAMUSCULAR | Status: DC | PRN
  Administered 2024-10-10: 25 ug via INTRAVENOUS

## 2024-10-10 MED ORDER — CEFAZOLIN SODIUM-DEXTROSE 2-4 GM/100ML-% IV SOLN
INTRAVENOUS | Status: AC
  Filled 2024-10-10: qty 100

## 2024-10-10 MED ORDER — LIDOCAINE HCL (PF) 1 % IJ SOLN
INTRAMUSCULAR | Status: AC
  Filled 2024-10-10: qty 60

## 2024-10-10 MED ORDER — CEFAZOLIN SODIUM-DEXTROSE 2-4 GM/100ML-% IV SOLN
2.0000 g | INTRAVENOUS | Status: AC
  Administered 2024-10-10: 2 g via INTRAVENOUS

## 2024-10-10 MED ORDER — FENTANYL CITRATE (PF) 100 MCG/2ML IJ SOLN
INTRAMUSCULAR | Status: AC
  Filled 2024-10-10: qty 2

## 2024-10-10 NOTE — Interval H&P Note (Signed)
 History and Physical Interval Note:  10/10/2024 1:31 PM  Lawrence [name].  has presented today for surgery, with the diagnosis of eri.  The various methods of treatment have been discussed with the patient and family. After consideration of risks, benefits and other options for treatment, the patient has consented to  Procedures: PPM GENERATOR CHANGEOUT (N/A) as a surgical intervention.  The patient's history has been reviewed, patient examined, no change in status, stable for surgery.  I have reviewed the patient's chart and labs.  Questions were answered to the patient's satisfaction.     Drayce Tawil [employer]

## 2024-10-10 NOTE — Discharge Instructions (Signed)

## 2024-10-11 ENCOUNTER — Ambulatory Visit: Admitting: Internal Medicine

## 2024-10-11 ENCOUNTER — Encounter: Payer: Self-pay | Admitting: Internal Medicine

## 2024-10-11 VITALS — BP 126/80 | HR 65 | Temp 97.8°F | Resp 16 | Ht 73.0 in | Wt 227.4 lb

## 2024-10-11 DIAGNOSIS — G8929 Other chronic pain: Secondary | ICD-10-CM

## 2024-10-11 DIAGNOSIS — Z0001 Encounter for general adult medical examination with abnormal findings: Secondary | ICD-10-CM

## 2024-10-11 DIAGNOSIS — E785 Hyperlipidemia, unspecified: Secondary | ICD-10-CM

## 2024-10-11 DIAGNOSIS — Z0000 Encounter for general adult medical examination without abnormal findings: Secondary | ICD-10-CM

## 2024-10-11 DIAGNOSIS — Z7901 Long term (current) use of anticoagulants: Secondary | ICD-10-CM

## 2024-10-11 DIAGNOSIS — R809 Proteinuria, unspecified: Secondary | ICD-10-CM

## 2024-10-11 DIAGNOSIS — K219 Gastro-esophageal reflux disease without esophagitis: Secondary | ICD-10-CM

## 2024-10-11 DIAGNOSIS — E1121 Type 2 diabetes mellitus with diabetic nephropathy: Secondary | ICD-10-CM

## 2024-10-11 NOTE — Progress Notes (Signed)
 "  Subjective:    Patient ID: Lawrence [name]., male    DOB: 06/16/1951, 74 y.o.   MRN: [id number]  DOS:  10/11/2024 CPX  Discussed the use of AI scribe software for clinical note transcription with the patient, who gave verbal consent to proceed.  History of Present Illness  He has loose stools that he links to diet. He denies nausea, watery diarrhea, or blood in the stool. He is overdue for colonoscopy but has not scheduled it due to recent cardiac procedures, including pacemaker generator change yesterday.  His diabetes is treated with insulin .  He has chronic neck pain from degenerative disc disease, status post partial surgical treatment. Pain is localized to the neck and upper back without radiation to the arms. He uses extra strength Tylenol  and a heating pad with partial relief.  He drinks about two glasses of wine per day.  Current medications include insulin , metoprolol  for atrial fibrillation, a cholesterol medication, Symbicort  as needed, and famotidine  for acid reflux. He stopped omeprazole because it caused nausea.  He received RSV and influenza vaccines and the most recent COVID booster.   Review of Systems  Other than above, a 14 point review of systems is negative       Past Medical History:  Diagnosis Date   Anxiety 01/20/2023   Bradycardia    Cancer (HCC) 2013   esophageal cancer   Depression    Dysrhythmia    Afib   GERD (gastroesophageal reflux disease)    H/O hiatal hernia    s/p  repair 06-26-2012   History of esophagectomy 08-21-2012  AT [facility]   History of kidney stones 2006   History of malignant neoplasm of esophagus ONCOLOGIST-  DR D'AMICO AT [facility]-- PER LAST NOTE 01/ 2018  NO RECURRENCE   dx 10/ 2013  Stage 2A (T2 N0)  08-21-2012 s/p  esophagectomy w/ gastric pull-through [facility]) post residual recurrent anastomotic stricture (multiple EGD w/ dilations)   History of transient ischemic attack (TIA) 2003   no residual   Hypertension    Long  term (current) use of anticoagulants    xarelto    Macular degeneration    Neuromuscular disorder (HCC)    neuropathy of feet   On dofetilide  therapy    PAF (paroxysmal atrial fibrillation) (HCC) first dx 2009   primary cardiologist-  dr victory sharps /  EP cardiologist -- dr melchor ([facility])  s/p  afib ablation 08-02-2016   Pancreatic cyst    x2    Peyronie's disease    Presence of permanent cardiac pacemaker implanted 05-01-2014  at [facility]   Dr.Daubert -[facility] follows   Pulmonary nodule, right    upper and middle lobes --  per last CT stable   Type 2 diabetes mellitus treated with insulin  (HCC)    Wears glasses     Past Surgical History:  Procedure Laterality Date   ANTERIOR CERVICAL DECOMP/DISCECTOMY FUSION N/A 06/02/2016   Procedure: ANTERIOR CERVICAL DECOMPRESSION FUSION CERVICAL 5-6, CERVICAL 6-7 WITH INSTRUMENTATION AND ALLOGRAFT;  Surgeon: Oneil Priestly, MD;  Location: [facility] OR;  Service: Orthopedics;  Laterality: N/A;  ANTERIOR CERVICAL DECOMPRESSION FUSION CERVICAL 5-6, CERVICAL 6-7 WITH INSTRUMENTATION AND ALLOGRAFT   ATRIAL FIBRILLATION ABLATION N/A 05/01/2024   Procedure: ATRIAL FIBRILLATION ABLATION;  Surgeon: Inocencio Soyla Lunger, MD;  Location: [facility] INVASIVE CV LAB;  Service: Cardiovascular;  Laterality: N/A;   BIOPSY  06/26/2012   Procedure: BIOPSY;  Surgeon: Redell Faith, DO;  Location: [facility] ORS;  Service: General;;  CARDIAC ELECTROPHYSIOLOGY STUDY AND ABLATION  08-02-2016   [facility]   (dr melchor)   pulmonary veins isolation and ablation afib   CARDIAC PACEMAKER PLACEMENT  05-01-2014   [facility]   CARDIOVERSION N/A 01/31/2024   Procedure: CARDIOVERSION;  Surgeon: Lonni Slain, MD;  Location: [facility] INVASIVE CV LAB;  Service: Cardiovascular;  Laterality: N/A;   CHOLECYSTECTOMY  1983   COLONOSCOPY WITH PROPOFOL  N/A 07/13/2015   Procedure: COLONOSCOPY WITH PROPOFOL ;  Surgeon: Gladis MARLA Louder, MD;  Location: [facility] ENDOSCOPY;  Service: Endoscopy;  Laterality: N/A;    ESOPHAGECTOMY  08-21-2012   [facility]   w/ gastric pull-through   ESOPHAGOGASTRODUODENOSCOPY (EGD) WITH ESOPHAGEAL DILATION  multiple x32 per pt approx.--- last one 02-09-2015   recurrent anastomotic stricture post esophagectomy   HIATAL HERNIA REPAIR  06/26/2012   Procedure: LAPAROSCOPIC REPAIR OF HIATAL HERNIA;  Surgeon: Redell Faith, DO;  Location: [facility] ORS;  Service: General;;   HUMERUS IM NAIL Right 12/28/2023   Procedure: INSERTION, INTRAMEDULLARY ROD, HUMERUS;  Surgeon: Dozier Soulier, MD;  Location: [facility] ORS;  Service: Orthopedics;  Laterality: Right;   INCISIONAL HERNIA REPAIR  06/26/2012   Procedure: HERNIA REPAIR INCISIONAL;  Surgeon: Redell Faith, DO;  Location: [facility] ORS;  Service: General;;   INGUINAL HERNIA REPAIR Bilateral 1982   JEJUNOSTOMY FEEDING TUBE  12/22/2012   Removed 12/ 2016  approx.   KNEE ARTHROSCOPY Left 1990   KNEE ARTHROSCOPY Right 12/2019   LAPAROSCOPIC GASTRIC SLEEVE RESECTION  06/2012   started but abandoned d/t discovery of tumor   LAPAROTOMY  07/12/2021   revision of previous gastric conduit, hiatal hernia repair   MASS EXCISION Left 04/04/2013   Procedure: EXCISION OF SCALP MASS;  Surgeon: Redell Faith, DO;  Location: [facility] ORS;  Service: General;  Laterality: Left;   pilomatrixoma   NESBIT PROCEDURE N/A 12/09/2016   Procedure: NESBIT PROCEDURE 16 DOT PLICATION;  Surgeon: Mark Ottelin, MD;  Location: [facility];  Service: Urology;  Laterality: N/A;   PPM GENERATOR CHANGEOUT N/A 10/10/2024   Procedure: PPM GENERATOR CHANGEOUT;  Surgeon: Inocencio Soyla Gladis, MD;  Location: [facility] INVASIVE CV LAB;  Service: Cardiovascular;  Laterality: N/A;   THORACOTOMY  07/12/2021   revision of previous gastric conduit   TONSILLECTOMY  1959   TRANSTHORACIC ECHOCARDIOGRAM  08-06-2015   [facility]   mild LVH, ef 50%/  mild RAE /  moderate LAE/  trivial AR, MR, PR,  and TR    Current Outpatient Medications  Medication Instructions   acetaminophen  (TYLENOL ) 500-1,000 mg,  Every 6 hours PRN   Alcohol Swabs ([employer] PREP) 70 % PADS See admin instructions   budesonide -formoterol  (SYMBICORT ) 160-4.5 MCG/ACT inhaler 2 puffs, 2 times daily   CANNABIDIOL PO Take by mouth. CBD gummies 30mg  Delta, 30mg  CBD   Continuous Glucose Sensor (FREESTYLE LIBRE 2 PLUS SENSOR) MISC 1 each, Subcutaneous, As directed, Change sensor every 15 days   cyanocobalamin  (VITAMIN B12) 1,000 mcg, Daily at bedtime   Eliquis  5 mg, Oral, 2 times daily   empagliflozin  (JARDIANCE ) 25 mg, Oral, Daily before breakfast   famotidine  (PEPCID  AC) 10 mg, Oral, 2 times daily   FLUoxetine  (PROZAC ) 60 mg, Oral, Daily   guaiFENesin-dextromethorphan (ROBITUSSIN DM) 100-10 MG/5ML syrup 15 mLs, Every 8 hours PRN   hydrOXYzine  (ATARAX ) 25 mg, Oral, 2 times daily PRN   insulin  aspart protamine  - aspart (NOVOLOG  MIX 70/30 FLEXPEN) (70-30) 100 UNIT/ML FlexPen 20 Units, Subcutaneous, Daily with breakfast   insulin  NPH-regular Human (HUMULIN  70/30) (70-30) 100 UNIT/ML injection 5-20 Units,  See admin instructions   Insulin  Pen Needle 32G X 4 MM MISC Daily.   Insulin  Syringe-Needle U-100 (BD VEO INSULIN  SYRINGE U/F) 31G X 15/64 0.3 ML MISC Use as directed in the morning, at noon, in the evening, and at bedtime.   Iron 28 mg, Daily at bedtime   LORazepam  (ATIVAN ) 2 mg,  Once   metoprolol  tartrate (LOPRESSOR ) 25 mg, Oral, 2 times daily   Multiple Vitamins-Minerals (PRESERVISION AREDS PO) 1 tablet, 2 times daily   OVER THE COUNTER MEDICATION 1 tablet, At bedtime PRN   Polyethyl Glycol-Propyl Glycol (SYSTANE) 0.4-0.3 % SOLN 1 drop, Daily PRN   rosuvastatin  (CRESTOR ) 40 mg, Oral, Daily   senna-docusate (SENNA PLUS) 8.6-50 MG tablet 1 tablet, At bedtime PRN       Objective:   Physical Exam HENT:     Head:     BP 126/80   Pulse 65   Temp 97.8 F (36.6 C) (Oral)   Resp 16   Ht 6' 1 (1.854 m)   Wt 227 lb 6 oz (103.1 kg)   SpO2 97%   BMI 30.00 kg/m  General: Well developed, NAD, BMI noted Neck: No   thyromegaly  HEENT:  Normocephalic . Face symmetric, atraumatic Lungs:  CTA B Normal respiratory effort, no intercostal retractions, no accessory muscle use. Heart: RRR,  no murmur.  Abdomen:  Not distended, soft, non-tender. No rebound or rigidity.   Lower extremities: no pretibial edema bilaterally  Skin: Exposed areas without rash. Not pale. Not jaundice Neurologic:  alert & oriented X3.  Speech normal, gait appropriate for age and unassisted Strength symmetric and appropriate for age.  Psych: Cognition and judgment appear intact.  Cooperative with normal attention span and concentration.  Behavior appropriate. No anxious or depressed appearing.     Assessment      Problem list: DM- per endo Mild nonproliferative diabetic retinopathy with macular edema. HTN High cholesterol Anxiety  GI: Esophageal neoplasm 2013 found during a gastric bypass  Hiatal hernia: R thoracotomy Laparotomy lysis of adhesions for repair of paraesophageal hernia in 2022  Cardiovascular: A-fib dx 2014, anticoagulated Pacemaker 2015, Ablation @ [facility] 2017 TIA 2003 ?  MRI negative, MRA some atherosclerosis distal R vertebral artery? Orthostatic hypotension CAD: Moderate-per CT coronary angiogram 01/2023, medical mngmt [facility]: see surgeries, chronic neck pain on tramadol  EtOH abuse (see OV 12/26/2023)   Assessment & Plan Here for CPX Tdap 2018. S/p Shingrix x2 PNM 23: 2018, 2019.  PNM 20: 2024. RSV 2024 Had a flu shot and a COVID booster (September) Prostate cancer screening: +FH, father ~ 73 y/o. Last PSA  2.6 (September 2025).  No symptoms [facility] Colonoscopy 05/17/2021, + polyps.  Pathology: Tubular adenomas, hyperplastic polyp.  Multiple polyps, 3 years, see pathology report.  He recently communicated with GI and they said that they will not be able to pursue any endoscopy due to recent cardiac procedure.  They will consider him in about 6 months. Labs: See orders  Other issues DM, with neuropathy  and albuminuria.  Per Endo, last A1c 6.7. Albuminuria: Saw nephrology 07/02/2024 due to albuminuria, they felt he was not a good candidate for GLP-1 because h/o esophageal surgery and recommended SGLT2.  They were considering a low-dose of ARB's. Saw Endo in December, they rx Jardiance  and a low-dose of losartan  but has not started the medications, likes more information about the new medicines. Saw nephrology few days ago, he is not sure if he was recommended Jardiance .  Notes from nephrology pending Plan:  Start Jardiance , watch for low blood sugars, it also may drop his BP.  Explained Jardiance  will protect his kidneys.  He agreed to proceed. Avoid with ARB's for now, has a history of orthostatic hypotension. High cholesterol: Well-controlled EtOH: Per nephrology note October 2025, drinks 1 bottle of wine a day.  Today he states that he is doing better with 2 glasses of wine a day. Chronic neck pain    On Tylenol , symptoms not completely well-controlled,  pain management limited by alcohol use (avoid narcotic) and anticoagulation.  (avoid NSAIDs). Declined PT or see pain management for now. GERD symptoms are managed with famotidine , as omeprazole was discontinued due to nausea. Cyst: Has a cystic lesion x 6 months, benign appearing at the inner right lower lip.  We agreed on observation RTC 3 months  Time spent 45 minutes, multiple questions answered about albuminuria, what it is, why SGLT2's are beneficial.  Also long conversation about chronic pain management "

## 2024-10-11 NOTE — Patient Instructions (Addendum)
 Please read your instructions carefully.     Go to the front desk for the checkout Please make an appointment for a checkup in 3 months  Okay to start taking Jardiance  to prevent the kidney from leaking protein. Be aware it may drop your blood sugars. Be aware it may drop a little bit your blood pressure.  Keep in contact with your endocrinologist if your sugar goes low.  Check the  blood pressure regularly Blood pressure goal:  between 110/65 and  130/80. If it is consistently higher or lower, let me know

## 2024-10-11 NOTE — Assessment & Plan Note (Signed)
 Here for CPX Other issues DM, with neuropathy and albuminuria.  Per Endo, last A1c 6.7. Albuminuria: Saw nephrology 07/02/2024 due to albuminuria, they felt he was not a good candidate for GLP-1 because h/o esophageal surgery and recommended SGLT2.  They were considering a low-dose of ARB's. Saw Endo in December, they rx Jardiance  and a low-dose of losartan  but has not started the medications, likes more information about the new medicines. Saw nephrology few days ago, he is not sure if he was recommended Jardiance .  Notes from nephrology pending Plan:  Start Jardiance , watch for low blood sugars, it also may drop his BP.  Explained Jardiance  will protect his kidneys.  He agreed to proceed. Avoid with ARB's for now, has a history of orthostatic hypotension. High cholesterol: Well-controlled EtOH: Per nephrology note October 2025, drinks 1 bottle of wine a day.  Today he states that he is doing better with 2 glasses of wine a day. Chronic neck pain    On Tylenol , symptoms not completely well-controlled,  pain management limited by alcohol use (avoid narcotic) and anticoagulation.  (avoid NSAIDs). Declined PT or see pain management for now. GERD symptoms are managed with famotidine , as omeprazole was discontinued due to nausea. Cyst: Has a cystic lesion x 6 months, benign appearing at the inner right lower lip.  We agreed on observation RTC 3 months

## 2024-10-11 NOTE — Assessment & Plan Note (Signed)
 Here for CPX Tdap 2018. S/p Shingrix x2 PNM 23: 2018, 2019.  PNM 20: 2024. RSV 2024 Had a flu shot and a COVID booster (September) Prostate cancer screening: +FH, father ~ 73 y/o. Last PSA  2.6 (September 2025).  No symptoms [facility] Colonoscopy 05/17/2021, + polyps.  Pathology: Tubular adenomas, hyperplastic polyp.  Multiple polyps, 3 years, see pathology report.  He recently communicated with GI and they said that they will not be able to pursue any endoscopy due to recent cardiac procedure.  They will consider him in about 6 months. Labs: See orders

## 2024-10-12 ENCOUNTER — Ambulatory Visit

## 2024-10-15 ENCOUNTER — Ambulatory Visit: Admitting: Psychology

## 2024-10-23 ENCOUNTER — Other Ambulatory Visit

## 2024-10-23 ENCOUNTER — Ambulatory Visit

## 2024-11-12 ENCOUNTER — Ambulatory Visit

## 2024-11-27 ENCOUNTER — Ambulatory Visit: Admitting: Endocrinology

## 2024-11-28 ENCOUNTER — Encounter

## 2024-12-11 ENCOUNTER — Encounter: Admitting: Internal Medicine

## 2024-12-13 ENCOUNTER — Ambulatory Visit

## 2025-01-08 ENCOUNTER — Ambulatory Visit: Admitting: Internal Medicine

## 2025-01-13 ENCOUNTER — Ambulatory Visit

## 2025-01-13 ENCOUNTER — Ambulatory Visit: Admitting: Cardiology

## 2025-02-27 ENCOUNTER — Encounter

## 2025-05-23 ENCOUNTER — Ambulatory Visit

## 2025-05-29 ENCOUNTER — Encounter

## 2025-08-30 ENCOUNTER — Encounter

## 2025-11-29 ENCOUNTER — Encounter
